# Patient Record
Sex: Female | Born: 1950 | Race: Black or African American | Hispanic: No | Marital: Single | Smoking: Never smoker
Health system: Southern US, Community
[De-identification: ages and names within clinical notes are randomized; demographics above are authoritative.]

## PROBLEM LIST (undated history)

## (undated) DIAGNOSIS — M1990 Unspecified osteoarthritis, unspecified site: Secondary | ICD-10-CM

## (undated) DIAGNOSIS — E859 Amyloidosis, unspecified: Secondary | ICD-10-CM

## (undated) DIAGNOSIS — I10 Essential (primary) hypertension: Secondary | ICD-10-CM

## (undated) DIAGNOSIS — E669 Obesity, unspecified: Secondary | ICD-10-CM

## (undated) DIAGNOSIS — E119 Type 2 diabetes mellitus without complications: Secondary | ICD-10-CM

## (undated) DIAGNOSIS — N189 Chronic kidney disease, unspecified: Secondary | ICD-10-CM

## (undated) HISTORY — DX: Essential (primary) hypertension: I10

## (undated) HISTORY — DX: Type 2 diabetes mellitus without complications: E11.9

## (undated) HISTORY — DX: Obesity, unspecified: E66.9

---

## 1999-09-10 ENCOUNTER — Encounter: Payer: Self-pay | Admitting: Family Medicine

## 1999-10-05 HISTORY — PX: TOTAL ABDOMINAL HYSTERECTOMY: SHX209

## 2000-07-04 HISTORY — PX: COMBINED ABDOMINOPLASTY AND LIPOSUCTION: SUR284

## 2000-07-04 LAB — CONVERTED CEMR LAB: Pap Smear: NORMAL

## 2004-11-14 ENCOUNTER — Ambulatory Visit: Payer: Self-pay | Admitting: Family Medicine

## 2004-11-28 ENCOUNTER — Ambulatory Visit: Payer: Self-pay | Admitting: Family Medicine

## 2004-12-04 ENCOUNTER — Ambulatory Visit: Payer: Self-pay | Admitting: Family Medicine

## 2005-01-02 ENCOUNTER — Ambulatory Visit: Payer: Self-pay | Admitting: Family Medicine

## 2005-01-02 LAB — HM MAMMOGRAPHY: HM Mammogram: NORMAL

## 2005-02-14 ENCOUNTER — Ambulatory Visit: Payer: Self-pay | Admitting: Family Medicine

## 2005-02-19 ENCOUNTER — Ambulatory Visit: Payer: Self-pay | Admitting: Family Medicine

## 2005-04-03 ENCOUNTER — Ambulatory Visit: Payer: Self-pay | Admitting: Family Medicine

## 2005-05-21 ENCOUNTER — Ambulatory Visit: Payer: Self-pay | Admitting: Family Medicine

## 2005-06-05 ENCOUNTER — Ambulatory Visit: Payer: Self-pay | Admitting: Family Medicine

## 2005-06-25 ENCOUNTER — Ambulatory Visit: Payer: Self-pay | Admitting: Family Medicine

## 2009-08-29 ENCOUNTER — Ambulatory Visit: Payer: Self-pay | Admitting: Family Medicine

## 2009-08-29 DIAGNOSIS — I10 Essential (primary) hypertension: Secondary | ICD-10-CM

## 2009-08-30 LAB — CONVERTED CEMR LAB
ALT: 11 units/L (ref 0–35)
AST: 16 units/L (ref 0–37)
Basophils Relative: 1.2 % (ref 0.0–3.0)
CO2: 32 meq/L (ref 19–32)
Calcium: 9.5 mg/dL (ref 8.4–10.5)
Chloride: 106 meq/L (ref 96–112)
Eosinophils Relative: 0.4 % (ref 0.0–5.0)
GFR calc non Af Amer: 72.54 mL/min (ref >60)
HCT: 40.4 % (ref 36.0–46.0)
Hemoglobin: 13.6 g/dL (ref 12.0–15.0)
Lymphs Abs: 2.2 10*3/uL (ref 0.7–4.0)
Monocytes Relative: 4.9 % (ref 3.0–12.0)
Neutro Abs: 3.4 10*3/uL (ref 1.4–7.7)
Platelets: 224 10*3/uL (ref 150.0–400.0)
Potassium: 4.6 meq/L (ref 3.5–5.1)
RBC: 4.65 M/uL (ref 3.87–5.11)
Sodium: 143 meq/L (ref 135–145)
Total Bilirubin: 0.6 mg/dL (ref 0.3–1.2)
Total CHOL/HDL Ratio: 4
Total Protein: 6.9 g/dL (ref 6.0–8.3)
WBC: 6 10*3/uL (ref 4.5–10.5)

## 2009-09-20 ENCOUNTER — Ambulatory Visit: Payer: Self-pay | Admitting: Family Medicine

## 2009-09-22 LAB — CONVERTED CEMR LAB
Calcium: 10 mg/dL (ref 8.4–10.5)
Creatinine, Ser: 0.8 mg/dL (ref 0.4–1.2)
Glucose, Bld: 147 mg/dL — ABNORMAL HIGH (ref 70–99)
Hgb A1c MFr Bld: 7.7 % — ABNORMAL HIGH (ref 4.6–6.5)
Sodium: 144 meq/L (ref 135–145)

## 2009-09-27 ENCOUNTER — Ambulatory Visit: Payer: Self-pay | Admitting: Family Medicine

## 2009-09-27 ENCOUNTER — Encounter: Payer: Self-pay | Admitting: Family Medicine

## 2009-09-27 LAB — HM MAMMOGRAPHY: HM Mammogram: NORMAL

## 2009-09-28 ENCOUNTER — Encounter (INDEPENDENT_AMBULATORY_CARE_PROVIDER_SITE_OTHER): Payer: Self-pay | Admitting: *Deleted

## 2009-11-16 ENCOUNTER — Telehealth: Payer: Self-pay | Admitting: Family Medicine

## 2009-11-22 ENCOUNTER — Encounter (INDEPENDENT_AMBULATORY_CARE_PROVIDER_SITE_OTHER): Payer: Self-pay | Admitting: *Deleted

## 2010-01-15 ENCOUNTER — Encounter (INDEPENDENT_AMBULATORY_CARE_PROVIDER_SITE_OTHER): Payer: Self-pay | Admitting: *Deleted

## 2010-01-16 ENCOUNTER — Ambulatory Visit: Payer: Self-pay | Admitting: Gastroenterology

## 2010-01-30 ENCOUNTER — Ambulatory Visit: Payer: Self-pay | Admitting: Gastroenterology

## 2010-02-05 ENCOUNTER — Encounter: Payer: Self-pay | Admitting: Gastroenterology

## 2010-02-26 ENCOUNTER — Ambulatory Visit: Payer: Self-pay | Admitting: Family Medicine

## 2010-02-26 DIAGNOSIS — E119 Type 2 diabetes mellitus without complications: Secondary | ICD-10-CM

## 2010-02-28 LAB — CONVERTED CEMR LAB
BUN: 12 mg/dL (ref 6–23)
CO2: 29 meq/L (ref 19–32)
Calcium: 9.7 mg/dL (ref 8.4–10.5)
Creatinine, Ser: 0.8 mg/dL (ref 0.4–1.2)
GFR calc non Af Amer: 88.56 mL/min (ref >60)

## 2010-06-05 NOTE — Procedures (Signed)
Summary: Colonoscopy  Patient: [name] Note: All result statuses are Final unless otherwise noted.  Tests: (1) Colonoscopy (COL)   COL Colonoscopy           DONE     [facility]     [address]     [place], [place]  [address]           COLONOSCOPY PROCEDURE REPORT           PATIENT:  Gina, Hartman  MR#:  [id number]     BIRTHDATE:  [birth year], 59 yrs. old  GENDER:  female           ENDOSCOPIST:  Barbette Hair. Arlyce Dice, MD     Referred by:  Marne A. Milinda Antis, M.D.           PROCEDURE DATE:  01/30/2010     PROCEDURE:  Colon with cold biopsy polypectomy     ASA CLASS:  Class II     INDICATIONS:  1) Routine Risk Screening           MEDICATIONS:   Fentanyl 75 mcg IV, Versed 7 mg IV           DESCRIPTION OF PROCEDURE:   After the risks benefits and     alternatives of the procedure were thoroughly explained, informed     consent was obtained.  Digital rectal exam was performed and     revealed no abnormalities.   The LB [id number]-[id number] endoscope     was introduced through the anus and advanced to the cecum, which     was identified by both the appendix and ileocecal valve, without     limitations.  The quality of the prep was excellent, using     MoviPrep.  The instrument was then slowly withdrawn as the colon     was fully examined.     <<PROCEDUREIMAGES>>           FINDINGS:  A diminutive polyp was found in the cecum. The polyp     was removed using cold biopsy forceps (see image2). 1-66mm sessile     polyp  A diverticulum was found in the distal transverse colon     (see image7). Single diverticulum  This was otherwise a normal     examination of the colon (see image1, image3, image4, image5,     image8, image9, and image10).   Retroflexed views in the rectum     revealed no abnormalities.    The time to cecum =  3.25  minutes.     The scope was then withdrawn (time =  7.25  min) from the patient     and the procedure completed.           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 Otherwise you should continue to follow colorectal cancer     screening guidelines for "routine risk" patients with colonoscopy     in 10 years.           REPEAT EXAM:   You will receive a letter from Dr.  Kaplan in 1-2     weeks, after reviewing the final pathology, with followup     recommendations.           ______________________________     Barbette Hair Arlyce Dice, MD           CC:           n.     eSIGNED:   Barbette Hair. Kaplan at 01/30/2010 08:36 AM           [name], [id number]  Note: An exclamation mark (!) indicates a result that was not dispersed into the flowsheet. Document Creation Date: 01/30/2010 8:37 AM _______________________________________________________________________  (1) Order result status: Final Collection or observation date-time: 01/30/2010 08:29 Requested date-time:  Receipt date-time:  Reported date-time:  Referring Physician:   Ordering Physician: Melvia Heaps [phone]) Specimen Source:  Source: Launa Grill Order Number: [id number] Lab site:   Appended Document: Colonoscopy     Procedures Next Due Date:    Colonoscopy: 01/2020

## 2010-06-05 NOTE — Progress Notes (Signed)
Summary: wants colonoscopy  Phone Note Call from Patient Call back at Home Phone [phone]   Caller: Patient Call For: Judith Part MD Summary of Call: Patient called to see if she could get referral for to have colonoscopy. Please adivse.  Initial call taken by: Melody Comas,  November 16, 2009 11:54 AM  Follow-up for Phone Call        order done Follow-up by: Judith Part MD,  November 16, 2009 12:43 PM

## 2010-06-05 NOTE — Letter (Signed)
Summary: Instructions/[facility] [place] [facility]   Imported By: Lanelle Bal 09/04/2009 09:08:18  _____________________________________________________________________  External Attachment:    Type:   Image     Comment:   External Document

## 2010-06-05 NOTE — Letter (Signed)
Summary: [facility] Instructions  [facility]  [address] [place], [place] [address]   Phone: [phone]  Fax: [phone]       [name]    March 03, 1951    MRN: [id number]        Procedure Day Dorna Bloom:  Jake Shark  01/30/10     Arrival Time:  7:30AM     Procedure Time:  8:00AM     Location of Procedure:                    [facility]  [facility] (4th Floor)                       PREPARATION FOR COLONOSCOPY WITH MOVIPREP   Starting 5 days prior to your procedure 01/25/10 do not eat nuts, seeds, popcorn, corn, beans, peas,  salads, or any raw vegetables.  Do not take any fiber supplements (e.g. Metamucil, Citrucel, and Benefiber).  THE DAY BEFORE YOUR PROCEDURE         DATE: 01/29/10  DAY: MONDAY  1.  Drink clear liquids the entire day-NO SOLID FOOD  2.  Do not drink anything colored red or purple.  Avoid juices with pulp.  No orange juice.  3.  Drink at least 64 oz. (8 glasses) of fluid/clear liquids during the day to prevent dehydration and help the prep work efficiently.  CLEAR LIQUIDS INCLUDE: Water Jello Ice Popsicles Tea (sugar ok, no milk/cream) Powdered fruit flavored drinks Coffee (sugar ok, no milk/cream) Gatorade Juice: apple, white grape, white cranberry  Lemonade Clear bullion, consomm, broth Carbonated beverages (any kind) Strained chicken noodle soup Hard Candy                             4.  In the morning, mix first dose of MoviPrep solution:    Empty 1 Pouch A and 1 Pouch B into the disposable container    Add lukewarm drinking water to the top line of the container. Mix to dissolve    Refrigerate (mixed solution should be used within 24 hrs)  5.  Begin drinking the prep at 5:00 p.m. The MoviPrep container is divided by 4 marks.   Every 15 minutes drink the solution down to the next mark (approximately 8 oz) until the full liter is complete.   6.  Follow completed prep with 16 oz of clear liquid of your choice (Nothing  red or purple).  Continue to drink clear liquids until bedtime.  7.  Before going to bed, mix second dose of MoviPrep solution:    Empty 1 Pouch A and 1 Pouch B into the disposable container    Add lukewarm drinking water to the top line of the container. Mix to dissolve    Refrigerate  THE DAY OF YOUR PROCEDURE      DATE: 01/30/10  DAY: TUESDAY  Beginning at 3:00AM (5 hours before procedure):         1. Every 15 minutes, drink the solution down to the next mark (approx 8 oz) until the full liter is complete.  2. Follow completed prep with 16 oz. of clear liquid of your choice.    3. You may drink clear liquids until 6:00AM (2 HOURS BEFORE PROCEDURE).   MEDICATION INSTRUCTIONS  Unless otherwise instructed, you should take regular prescription medications with a small sip of water   as early as possible the morning of  your procedure.   Additional medication instructions: Hold Lisinopril/HCTZ the morning of procedure.         OTHER INSTRUCTIONS  You will need a responsible adult at least 62 years of age to accompany you and drive you home.   This person must remain in the waiting room during your procedure.  Wear loose fitting clothing that is easily removed.  Leave jewelry and other valuables at home.  However, you may wish to bring a book to read or  an iPod/MP3 player to listen to music as you wait for your procedure to start.  Remove all body piercing jewelry and leave at home.  Total time from sign-in until discharge is approximately 2-3 hours.  You should go home directly after your procedure and rest.  You can resume normal activities the  day after your procedure.  The day of your procedure you should not:   Drive   Make legal decisions   Operate machinery   Drink alcohol   Return to work  You will receive specific instructions about eating, activities and medications before you leave.    The above instructions have been reviewed and explained to  me by   Wyona Almas RN  January 16, 2010 9:23 AM     I fully understand and can verbalize these instructions _____________________________ Date _________

## 2010-06-05 NOTE — Letter (Signed)
Summary: Results Follow up Letter  [facility]  [address] [place], [place] [address]   Phone: [phone]  Fax: [phone]    09/28/2009 MRN: [id number]     [facility] [address] [place], [place]  [address]    Dear [name],  The following are the results of your recent test(s):  Test         Result    Pap Smear:        Normal _____  Not Normal _____ Comments: ______________________________________________________ Cholesterol: LDL(Bad cholesterol):         Your goal is less than:         HDL (Good cholesterol):       Your goal is more than: Comments:  ______________________________________________________ Mammogram:        Normal __x___  Not Normal _____ Comments:Repeat in 1 year  ___________________________________________________________________ Hemoccult:        Normal _____  Not normal _______ Comments:    _____________________________________________________________________ Other Tests:    We routinely do not discuss normal results over the telephone.  If you desire a copy of the results, or you have any questions about this information we can discuss them at your next office visit.   Sincerely,  Roxy Manns MD

## 2010-06-05 NOTE — Letter (Signed)
Summary: Results Letter   G[facility]enter[facility]  [address] [place], [place] [address]   Phone: [phone]  Fax: [phone]        February 05, 2010 MRN: [id number]    [facility] [address] [place], [place]  [address]    Dear Ms. Fiola,  Your biopsy  results did not show any remarkable findings.  Please continue with the recommendations previously discussed.  Should you have any further questions or immediate concers, feel free to contact me.  Sincerely,  Barbette Hair. Arlyce Dice, M.D., [facility]          Sincerely,  Louis Meckel MD  This letter has been electronically signed by your physician.  Appended Document: Results Letter letter mailed

## 2010-06-05 NOTE — Assessment & Plan Note (Signed)
Summary: PT TO RE-ESTABLISH/CLE   Vital Signs:  Patient profile:   60 year old female Height:      62 inches Weight:      220 pounds BMI:     40.38 Temp:     98.6 degrees F oral Pulse rate:   72 / minute Pulse rhythm:   regular BP sitting:   184 / 112  (left arm) Cuff size:   large  Vitals Entered By: Lewanda Rife LPN (August 29, 2009 11:06 AM)  Serial Vital Signs/Assessments:  Time      Position  BP       Pulse  Resp  Temp     By                     168/100                        Judith Part MD  CC: [name] as pt   History of Present Illness: is here to re - est with me   she has HTN -- and took med for a while stopped coming for visits -- and got too busy bp med was making her dizzy -- hctz? or benazapril   not feeling bad at all no headaches or flushing  does get swollen fingers at times   3 times per week - [facility]  also uses elliptical 5 days per week  is staying in good shape eats chicken and fish - no fried foods/ steamed veg and brown rice   had gained wt and now lost it -- lost 2 sizes - proud of that  no hx of high chol   now not working - retired from her part time job  is going to work on health and wt for a while  does have insurance   is getting through menopause   knees hurt at times --- spinning hurt them  is interested in glucosamine to try for pain  knows further wt loss will help     Allergies (verified): No Known Drug Allergies  Past History:  Family History: Last updated: 08/29/2009 Mother: arthritis              high blood pressure              breast cancer              lung cancer             diabetes             mother deceased 2007/09/25  Social History: Last updated: 08/29/2009 G1P1 never smoked  no alcohol  regular exercise   Past Medical History: Hypertension  Past Surgical History: Hysterectomy 10/1999-- fibroids , total hyst  Abdominoplasty/Liposuction 07/2000  Family  History: Mother: arthritis              high blood pressure              breast cancer              lung cancer             diabetes             mother deceased 09-25-07  Social History: G1P1 never smoked  no alcohol  regular exercise   Review of Systems General:  Denies fatigue, fever, loss of appetite, and malaise. Eyes:  Denies blurring and eye pain.  CV:  Denies chest pain or discomfort, lightheadness, and palpitations. Resp:  Denies cough and wheezing. GI:  Denies abdominal pain, change in bowel habits, and indigestion. GU:  Denies abnormal vaginal bleeding, dysuria, and nocturia. MS:  Complains of joint pain and stiffness; denies joint redness, joint swelling, cramps, and muscle weakness. Derm:  Denies itching, lesion(s), poor wound healing, and rash. Neuro:  Denies numbness and tingling. Psych:  Denies anxiety and depression. Endo:  Denies cold intolerance, excessive thirst, excessive urination, and heat intolerance. Heme:  Denies abnormal bruising and bleeding.  Physical Exam  General:  overweight but generally well appearing  Head:  normocephalic, atraumatic, and no abnormalities observed.   Eyes:  vision grossly intact, pupils equal, pupils round, and pupils reactive to light.  no conjunctival pallor, injection or icterus  Ears:  R ear normal and L ear normal.  scant cerumen Mouth:  pharynx pink and moist.   Neck:  supple with full rom and no masses or thyromegally, no JVD or carotid bruit  Chest Wall:  No deformities, masses, or tenderness noted. Lungs:  Normal respiratory effort, chest expands symmetrically. Lungs are clear to auscultation, no crackles or wheezes. Heart:  Normal rate and regular rhythm. S1 and S2 normal without gallop, murmur, click, rub or other extra sounds. Abdomen:  Bowel sounds positive,abdomen soft and non-tender without masses, organomegaly or hernias noted. no renal bruits  Msk:  No deformity or scoliosis noted of thoracic or lumbar spine.  nl  rom knees today Pulses:  R and L carotid,radial,femoral,dorsalis pedis and posterior tibial pulses are full and equal bilaterally Extremities:  No clubbing, cyanosis, edema, or deformity noted with normal full range of motion of all joints.   Neurologic:  sensation intact to light touch, gait normal, and DTRs symmetrical and normal.   Skin:  Intact without suspicious lesions or rashes Cervical Nodes:  No lymphadenopathy noted Psych:  normal affect, talkative and pleasant    Impression & Recommendations:  Problem # 1:  HYPERTENSION (ICD-401.9) Assessment New out of control with need to get back on med  will start lisinopril hct - disc poss side eff disc reasons for tx HTN  lifestyle changes are great urged to keep it up lab today f/u 2-3 wk update earlier if side eff or problems  Her updated medication list for this problem includes:    Lisinopril-hydrochlorothiazide 20-12.5 Mg Tabs (Lisinopril-hydrochlorothiazide) .Marland Kitchen... 1 by mouth once daily in am  Orders: Venipuncture ([address]) TLB-Lipid Panel (80061-LIPID) TLB-Renal Function Panel (80069-RENAL) TLB-CBC Platelet - w/Differential (85025-CBCD) TLB-Hepatic/Liver Function Pnl (80076-HEPATIC) TLB-TSH (Thyroid Stimulating Hormone) (60454-UJW) Prescription Created Electronically [phone])  Problem # 2:  OTHER SCREENING MAMMOGRAM (ICD-V76.12) Assessment: Comment Only schedule annual mammogram - is overdue  Orders: Radiology Referral (Radiology)  Problem # 3:  SCREENING, COLON CANCER (ICD-V76.51) Assessment: Comment Only due for colonosc in may will put off sched this until HTN is in better control   Problem # 4:  Preventive Health Care (ICD-V70.0) Assessment: Comment Only Td update today  Complete Medication List: 1)  Flax Oil (Flaxseed (linseed)) .... Take two daily 2)  Fish Oil Oil (Fish oil) .... Take one daily by mouth 3)  Multivitamins Tabs (Multiple vitamin) .... Take 1 tablet by mouth once a day 4)   Lisinopril-hydrochlorothiazide 20-12.5 Mg Tabs (Lisinopril-hydrochlorothiazide) .Marland Kitchen.. 1 by mouth once daily in am  Other Orders: TD Toxoids IM 7 YR + ([address]) Admin 1st Vaccine ([address])  Patient Instructions: 1)  the current recommendation for calcium intake is 1200-1500 mg daily  with 1000 IU of vitamin D  2)  you can try some glucocamine and chondroiton for your knees  3)  keep up the good work with diet and exercise  4)  start lisinopril- hct and update me if any problems or side effects 5)  follow up with me in 2-3 weeks 6)  we will schedule mammogram at check out  7)  we will schedule screening colonoscopy at check out  8)  tetnus shot today  Prescriptions: LISINOPRIL-HYDROCHLOROTHIAZIDE 20-12.5 MG TABS (LISINOPRIL-HYDROCHLOROTHIAZIDE) 1 by mouth once daily in am  #30 x 11   Entered and Authorized by:   Judith Part MD   Signed by:   Judith Part MD on 08/29/2009   Method used:   Electronically to        [facility]* (retail)       [address]       [place], [place]  [address]       Ph: [phone]       Fax: [phone]   RxID:   [id number]   Current Allergies (reviewed today): No known allergies    Preventive Care Screening  Colonoscopy:    Date:  09/10/1999    Next Due:  09/2009    Results:  normal   Mammogram:    Date:  01/02/2005    Results:  normal   Pap Smear:    Date:  07/04/2000    Results:  normal    Immunizations Administered:  Tetanus Vaccine:    Vaccine Type: Td    Site: left deltoid    Mfr: Sanofi Pasteur    Dose: 0.5 ml    Route: IM    Given by: Lewanda Rife LPN    Exp. Date: 03/21/2011    Lot #: [id number]    VIS given: 03/24/07 version given August 29, 2009.

## 2010-06-05 NOTE — Assessment & Plan Note (Signed)
Summary: 2-3 WEEK FOLLOW UP/[facility]   Vital Signs:  Patient profile:   60 year old female Height:      62 inches Weight:      217.25 pounds BMI:     39.88 Temp:     98.3 degrees F oral Pulse rate:   80 / minute Pulse rhythm:   regular BP sitting:   130 / 86  (left arm) Cuff size:   large  Vitals Entered By: Lewanda Rife LPN (Sep 20, 2009 11:27 AM) CC: 2-3 week f/u   History of Present Illness: here for f/u of HTN and hyperglycemia   labs overall not bad lipids with trig 110 and HDL 47 and LDL 106  fasting sugar a bit  high at 145 no excessive thirst or urination   diet -- has changed a lot  much less junk food -- is really aiming for low sugar  is eating higher fiber foods  is exercising more -- elliptical and water aerobics - 5 days per week    bp better back on med 130/86 first check her med is making her a bit lightheaded -- one day systolic was 109 (had not eaten enough that day)  wants to try a bit longer before stopping   definitely feels better - knees do not hurt as bad and is really stronger and with more stamina   colonosc is due in may-- she does not want to schedule that yet-- will call and let us know when ready to do that  Allergies (verified): No Known Drug Allergies  Past History:  Past Medical History: Last updated: 08/29/2009 Hypertension  Past Surgical History: Last updated: 08/29/2009 Hysterectomy 10/1999-- fibroids , total hyst  Abdominoplasty/Liposuction 07/2000  Family History: Last updated: 08/29/2009 Mother: arthritis              high blood pressure              breast cancer              lung cancer             diabetes             mother deceased 10/14/2007  Social History: Last updated: 08/29/2009 G1P1 never smoked  no alcohol  regular exercise   Review of Systems General:  Denies fatigue, loss of appetite, and malaise. Eyes:  Denies blurring and eye irritation. CV:  Denies chest pain or discomfort, lightheadness,  palpitations, and swelling of feet. Resp:  Denies cough, shortness of breath, and wheezing. GI:  Denies abdominal pain, indigestion, and nausea. GU:  Denies urinary frequency. MS:  Denies muscle aches and cramps. Derm:  Denies itching, lesion(s), poor wound healing, and rash. Neuro:  Denies headaches, numbness, sensation of room spinning, tingling, visual disturbances, and weakness. Psych:  mood is good . Endo:  Denies cold intolerance, excessive thirst, excessive urination, and heat intolerance. Heme:  Denies abnormal bruising and bleeding.  Physical Exam  General:  overweight but generally well appearing  Head:  normocephalic, atraumatic, and no abnormalities observed.   Eyes:  vision grossly intact, pupils equal, pupils round, and pupils reactive to light.  no conjunctival pallor, injection or icterus  Mouth:  pharynx pink and moist.   Neck:  supple with full rom and no masses or thyromegally, no JVD or carotid bruit  Lungs:  Normal respiratory effort, chest expands symmetrically. Lungs are clear to auscultation, no crackles or wheezes. Heart:  Normal rate and regular rhythm. S1 and  S2 normal without gallop, murmur, click, rub or other extra sounds. Msk:  No deformity or scoliosis noted of thoracic or lumbar spine.  nl rom knees today Pulses:  R and L carotid,radial,femoral,dorsalis pedis and posterior tibial pulses are full and equal bilaterally Extremities:  No clubbing, cyanosis, edema, or deformity noted with normal full range of motion of all joints.   Neurologic:  sensation intact to light touch, gait normal, and DTRs symmetrical and normal.   Skin:  Intact without suspicious lesions or rashes Cervical Nodes:  No lymphadenopathy noted Psych:  normal affect, talkative and pleasant    Impression & Recommendations:  Problem # 1:  HYPERTENSION (ICD-401.9) Assessment Improved  this is improved with re start of lisinopril hct (? though some occ light headedness) pt wants to try  a bit longer and monitor pressure  also great diet and exercis and wt loss- commended on these changes lab today  f/u 6 mo  prev labs rev in detail incl fairly good chol prof Her updated medication list for this problem includes:    Lisinopril-hydrochlorothiazide 20-12.5 Mg Tabs (Lisinopril-hydrochlorothiazide) .Marland Kitchen... 1 by mouth once daily in am  Orders: Venipuncture ([address]) TLB-Renal Function Panel (80069-RENAL) TLB-A1C / Hgb A1C (Glycohemoglobin) (83036-A1C)  BP today: 130/86 Prior BP: 184/112 (08/29/2009)  Labs Reviewed: K+: 4.6 (08/29/2009) Creat: : 1.0 (08/29/2009)   Chol: 175 (08/29/2009)   HDL: 47.00 (08/29/2009)   LDL: 106 (08/29/2009)   TG: 110.0 (08/29/2009)  Problem # 2:  HYPERGLYCEMIA (ICD-790.29) Assessment: New fasting sugar of 145 reviewed does have fam hx of DM and is obese disc diet and exercise in detail- doing much better given handout from [facility] on DM diet  check AIC today  f/u 6 mo or earlier if it is high  urged to keep working on wt loss  Orders: Venipuncture (81[address]) TLB-Renal Function Panel (80069-RENAL) TLB-A1C / Hgb A1C (Glycohemoglobin) (83036-A1C)  Complete Medication List: 1)  Flax Oil (Flaxseed (linseed)) .... Take two daily 2)  Fish Oil Oil (Fish oil) .... Take one daily by mouth 3)  Multivitamins Tabs (Multiple vitamin) .... Take 1 tablet by mouth once a day 4)  Lisinopril-hydrochlorothiazide 20-12.5 Mg Tabs (Lisinopril-hydrochlorothiazide) .Marland Kitchen.. 1 by mouth once daily in am  Patient Instructions: 1)  labs today 2)  checking AIC for possible diabetes  3)  blood pressure is better  4)  continue current medicines  5)  if your lightheadedness continues -- please call  6)  follow up with me in 6 months 7)  call us when you are ready to schedule your colonoscopy 8)  keep up the great exercise   Current Allergies (reviewed today): No known allergies

## 2010-06-05 NOTE — Letter (Signed)
Summary: Previsit letter  [facility]  [address] [place], [place] [address]   Phone: [phone]  Fax: [phone]       11/22/2009 MRN: [id number]  [facility] [address] [place], [place]  [address]  Dear Ms. Bialecki,  Welcome to the Gastroenterology Division at [facility]    You are scheduled to see a nurse for your pre-procedure visit on 01-16-10 at 9:00a.m. on the 3rd floor at [facility], [address]  We ask that you try to arrive at our office 15 minutes prior to your appointment time to allow for check-in.  Your nurse visit will consist of discussing your medical and surgical history, your immediate family medical history, and your medications.    Please bring a complete list of all your medications or, if you prefer, bring the medication bottles and we will list them.  We will need to be aware of both prescribed and over the counter drugs.  We will need to know exact dosage information as well.  If you are on blood thinners (Coumadin, Plavix, Aggrenox, Ticlid, etc.) please call our office today/prior to your appointment, as we need to consult with your physician about holding your medication.   Please be prepared to read and sign documents such as consent forms, a financial agreement, and acknowledgement forms.  If necessary, and with your consent, a friend or relative is welcome to sit-in on the nurse visit with you.  Please bring your insurance card so that we may make a copy of it.  If your insurance requires a referral to see a specialist, please bring your referral form from your primary care physician.  No co-pay is required for this nurse visit.     If you cannot keep your appointment, please call [phone] to cancel or reschedule prior to your appointment date.  This allows [place] the opportunity to schedule an appointment for another patient in need of care.    Thank you for choosing [facility] for your medical  needs.  We appreciate the opportunity to care for you.  Please visit [place] at our website  to learn more about our practice.                     Sincerely.                                                                                                                   The Gastroenterology Division

## 2010-06-05 NOTE — Assessment & Plan Note (Signed)
Summary: 6 MONTH FOLLOW UP, BP ELEVATED   Vital Signs:  Patient profile:   60 year old female Height:      62 inches Weight:      216.50 pounds BMI:     39.74 Temp:     98.8 degrees F oral Pulse rate:   80 / minute Pulse rhythm:   regular BP sitting:   158 / 86  (left arm) Cuff size:   large  Vitals Entered By: Lewanda Rife LPN (February 26, 2010 12:22 PM) CC: ?BP elevated dizzy on and off since 02/16/10   History of Present Illness: here for f/u of HTN and also DM  is on lisinopril/ hct   bp has been high since oct 14th or so  running in 150s /80s  woke up then and room was spinning -- originally 200/100  ? vertigo  the next day no problems now once in a while spinning - brief - on and off  no med doses  can exercise with no problems   last AIC7.7 recommended DM ed   recommended diet and sugar check she does not  she knows what a DM diet is and keeping up with that (mother had DM)  changed all white starch to brown  more fiber/less sugar no sweets in general -- except occ animal cookies  no soda or juice   exercising a lot -- doing a 5K sat -- walk and run  Allergies (verified): No Known Drug Allergies  Past History:  Past Medical History: Last updated: 08/29/2009 Hypertension  Past Surgical History: Last updated: 08/29/2009 Hysterectomy 10/1999-- fibroids , total hyst  Abdominoplasty/Liposuction 07/2000  Family History: Last updated: 08/29/2009 Mother: arthritis              high blood pressure              breast cancer              lung cancer             diabetes             mother deceased Oct 02, 2007  Social History: Last updated: 08/29/2009 G1P1 never smoked  no alcohol  regular exercise   Review of Systems General:  Denies chills, fatigue, fever, loss of appetite, malaise, and sleep disorder. Eyes:  Denies blurring, double vision, eye irritation, and eye pain. ENT:  Complains of postnasal drainage; denies nasal congestion, ringing in ears,  sinus pressure, and sore throat. CV:  Denies chest pain or discomfort, palpitations, shortness of breath with exertion, and swelling of feet. Resp:  Denies cough, shortness of breath, and wheezing. GI:  Denies abdominal pain, nausea, and vomiting. GU:  Denies dysuria and urinary frequency. MS:  Denies muscle aches and cramps. Derm:  Denies itching, lesion(s), poor wound healing, and rash. Neuro:  Denies numbness and tingling. Psych:  Denies anxiety and depression. Endo:  Denies cold intolerance, excessive thirst, excessive urination, and heat intolerance. Heme:  Denies abnormal bruising and bleeding. Allergy:  ? seasonal allergies.  Physical Exam  General:  overweight but generally well appearing  Head:  normocephalic, atraumatic, and no abnormalities observed.   Eyes:  vision grossly intact, pupils equal, pupils round, and pupils reactive to light.  no conjunctival pallor, injection or icterus  1-2 beats of horiz nystagmus bilat  Ears:  R ear normal and L ear normal.   Nose:  nares are boggy but clear Mouth:  pharynx pink and moist.   Neck:  supple  with full rom and no masses or thyromegally, no JVD or carotid bruit  Chest Wall:  No deformities, masses, or tenderness noted. Lungs:  Normal respiratory effort, chest expands symmetrically. Lungs are clear to auscultation, no crackles or wheezes. Heart:  Normal rate and regular rhythm. S1 and S2 normal without gallop, murmur, click, rub or other extra sounds. Abdomen:  soft and non-tender.  no renal bruits  Msk:  No deformity or scoliosis noted of thoracic or lumbar spine.   Pulses:  R and L carotid,radial,femoral,dorsalis pedis and posterior tibial pulses are full and equal bilaterally Extremities:  No clubbing, cyanosis, edema, or deformity noted with normal full range of motion of all joints.   Neurologic:  alert & oriented X3, cranial nerves II-XII intact, strength normal in all extremities, sensation intact to light touch, gait  normal, DTRs symmetrical and normal, and Romberg negative.   Skin:  Intact without suspicious lesions or rashes Cervical Nodes:  No lymphadenopathy noted Psych:  normal affect, talkative and pleasant   Diabetes Management Exam:    Foot Exam (with socks and/or shoes not present):       Sensory-Pinprick/Light touch:          Left medial foot (L-4): normal          Left dorsal foot (L-5): normal          Left lateral foot (S-1): normal          Right medial foot (L-4): normal          Right dorsal foot (L-5): normal          Right lateral foot (S-1): normal       Sensory-Monofilament:          Left foot: normal          Right foot: normal       Inspection:          Left foot: normal          Right foot: normal       Nails:          Left foot: normal          Right foot: normal   Impression & Recommendations:  Problem # 1:  DIABETES-TYPE 2 (ICD-250.00) Assessment New with last AIc 7.7 pt has been to DM ed before and could not afford to go again says she has good understanding of diet lab today disc at f/u disc healthy diet (low simple sugar/ choose complex carbs/ low sat fat) diet and exercise in detail    Her updated medication list for this problem includes:    Lisinopril-hydrochlorothiazide 20-12.5 Mg Tabs (Lisinopril-hydrochlorothiazide) .Marland Kitchen... 2  by mouth once daily in am  Problem # 2:  HYPERTENSION (ICD-401.9) Assessment: Deteriorated  this is worse - despite better diet and exercise  inc lisin/ hct to 40-25 today-- check at home and update lab today f/u after nov trip   Her updated medication list for this problem includes:    Lisinopril-hydrochlorothiazide 20-12.5 Mg Tabs (Lisinopril-hydrochlorothiazide) .Marland Kitchen... 2  by mouth once daily in am  Orders: Venipuncture (13244) TLB-Renal Function Panel (80069-RENAL) TLB-ALT (SGPT) (84460-ALT) TLB-AST (SGOT) (84450-SGOT) TLB-A1C / Hgb A1C (Glycohemoglobin) (83036-A1C) Prescription Created Electronically [phone])  BP  today: 158/86 Prior BP: 130/86 (09/20/2009)  Labs Reviewed: K+: 4.5 (09/20/2009) Creat: : 0.8 (09/20/2009)   Chol: 175 (08/29/2009)   HDL: 47.00 (08/29/2009)   LDL: 106 (08/29/2009)   TG: 110.0 (08/29/2009)  Problem # 3:  VERTIGO (ICD-780.4) Assessment: New  one bad day - now much better  will watch closely in light of inc bp  update asap if symptoms worsen or ha or other neurol change  Complete Medication List: 1)  Flax Oil (Flaxseed (linseed)) .... Take two daily 2)  Fish Oil Oil (Fish oil) .... Take one daily by mouth 3)  Multivitamins Tabs (Multiple vitamin) .... Take 1 tablet by mouth once a day 4)  Lisinopril-hydrochlorothiazide 20-12.5 Mg Tabs (Lisinopril-hydrochlorothiazide) .... 2  by mouth once daily in am 5)  Glucometer  .... To check glucose one time daily  and as needed for dm 250.0 6)  Glucose Test Strips and Lancets  .... Check sugar once daily and as needed for dm 250.0  Patient Instructions: 1)  increase your lisinopril hct to 2 pills once daily  2)  let me know if any side effects  3)  check sugar am fasting MWF and 2 hours after and afternoon meal on T, TH, Sat, Sun  4)  eat a diabetic diet  5)  get book sugar busters from bookstore  6)  keep up the exercise 7)  if dizziness does not improve in next 1-2 weeks- let me know 8)  follow up with me in 2-3 weeks  Prescriptions: GLUCOMETER to check glucose one time daily  and as needed for DM 250.0  #1 x 0   Entered and Authorized by:   Judith Part MD   Signed by:   Judith Part MD on 02/26/2010   Method used:   Print then Give to Patient   RxID:   [id number] GLUCOSE TEST STRIPS AND LANCETS check sugar once daily and as needed for DM 250.0  #100 each x 3   Entered and Authorized by:   Judith Part MD   Signed by:   Judith Part MD on 02/26/2010   Method used:   Print then Give to Patient   RxID:   [id number] LISINOPRIL-HYDROCHLOROTHIAZIDE 20-12.5 MG TABS (LISINOPRIL-HYDROCHLOROTHIAZIDE) 2   by mouth once daily in am  #60 x 3   Entered and Authorized by:   Judith Part MD   Signed by:   Judith Part MD on 02/26/2010   Method used:   Electronically to        [facility]* (retail)       [address]       [place], [place]  [address]       Ph: [phone]       Fax: [phone]   RxID:   [id number]    Orders Added: 1)  Venipuncture [56433] 2)  TLB-Renal Function Panel [80069-RENAL] 3)  TLB-ALT (SGPT) [84460-ALT] 4)  TLB-AST (SGOT) [84450-SGOT] 5)  TLB-A1C / Hgb A1C (Glycohemoglobin) [83036-A1C] 6)  Prescription Created Electronically [G8553] 7)  Est. Patient Level IV [29518]    Current Allergies (reviewed today): No known allergies

## 2010-06-05 NOTE — Miscellaneous (Signed)
Summary: [facility] Previsit/prep  Clinical Lists Changes  Medications: Added new medication of MOVIPREP 100 GM  SOLR (PEG-KCL-NACL-NASULF-NA ASC-C) As per prep instructions. - Signed Rx of MOVIPREP 100 GM  SOLR (PEG-KCL-NACL-NASULF-NA ASC-C) As per prep instructions.;  #1 x 0;  Signed;  Entered by: Wyona Almas RN;  Authorized by: Louis Meckel MD;  Method used: Electronically to [facility]*, [address], [place], [place]  [address], Ph: [phone], Fax: [phone] Observations: Added new observation of NKA: T (01/16/2010 8:24)    Prescriptions: MOVIPREP 100 GM  SOLR (PEG-KCL-NACL-NASULF-NA ASC-C) As per prep instructions.  #1 x 0   Entered by:   Wyona Almas RN   Authorized by:   Louis Meckel MD   Signed by:   Wyona Almas RN on 01/16/2010   Method used:   Electronically to        [facility]* (retail)       [address]       [place], [place]  [address]       Ph: [phone]       Fax: [phone]   RxID:   [id number]

## 2010-06-13 ENCOUNTER — Ambulatory Visit (INDEPENDENT_AMBULATORY_CARE_PROVIDER_SITE_OTHER): Payer: 59 | Admitting: Family Medicine

## 2010-06-13 ENCOUNTER — Encounter: Payer: Self-pay | Admitting: Family Medicine

## 2010-06-13 DIAGNOSIS — E119 Type 2 diabetes mellitus without complications: Secondary | ICD-10-CM

## 2010-06-13 DIAGNOSIS — I10 Essential (primary) hypertension: Secondary | ICD-10-CM

## 2010-06-27 NOTE — Assessment & Plan Note (Signed)
Summary: FOLLOW-UP/ LFW   Vital Signs:  Patient profile:   60 year old female Height:      62 inches Weight:      224.25 pounds BMI:     41.16 Temp:     98 degrees F oral Pulse rate:   76 / minute Pulse rhythm:   regular BP sitting:   118 / 76  (left arm) Cuff size:   large  Vitals Entered By: Lewanda Rife LPN (June 13, 2010 8:37 AM) CC: follow-up visit and refill meds   History of Present Illness: here for f/u of HTN and DM  wt is up 8 lb with bmi of 41 gained through the holidays and also went on a trip   118/76 is bp - better after inc of lisinopril hct no problems or side effects  is trying to drink a lot of water   joined [employer] -- has to take accountability for everything  doing since monday- lost 1 lb  for exercise - using eliptical / wts and water aerobics a personal trainer also     AIC is up to 7.8 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 2 years  used to see Dr Senaida Ores    Allergies (verified): No Known Drug Allergies  Past History:  Past Surgical History: Last updated: 08/29/2009 Hysterectomy 10/1999-- fibroids , total hyst  Abdominoplasty/Liposuction 07/2000  Family History: Last updated: 08/29/2009 Mother: arthritis              high blood pressure              breast cancer              lung cancer             diabetes             mother deceased Sep 27, 2007  Social History: Last updated: 08/29/2009 G1P1 never smoked  no alcohol  regular exercise   Past Medical History: Hypertension DM 2  obesity  Review of Systems General:  Denies fatigue, loss of appetite, and malaise. Eyes:  Denies blurring, eye irritation, and eye pain. CV:  Denies chest pain or discomfort, lightheadness,  and palpitations. Resp:  Denies cough and wheezing. GI:  Denies abdominal pain, indigestion, and nausea. GU:  Denies urinary frequency. MS:  Denies muscle aches and cramps. Derm:  Denies itching, lesion(s), poor wound healing, and rash. Neuro:  Denies headaches, numbness, and tingling. Endo:  Denies cold intolerance, excessive thirst, excessive urination, and heat intolerance. Heme:  Denies abnormal bruising and bleeding.  Physical Exam  General:  overweight but generally well appearing  Head:  normocephalic, atraumatic, and no abnormalities observed.   Eyes:  vision grossly intact, pupils equal, pupils round, and pupils reactive to light.   Mouth:  pharynx pink and moist.   Neck:  supple with full rom and no masses or thyromegally, no JVD or carotid bruit  Chest Wall:  No deformities, masses, or tenderness noted. Lungs:  Normal respiratory effort, chest expands symmetrically. Lungs are clear to auscultation, no crackles or wheezes. Heart:  Normal rate and regular rhythm. S1 and S2 normal without gallop, murmur, click,  rub or other extra sounds. Msk:  No deformity or scoliosis noted of thoracic or lumbar spine.   Pulses:  R and L carotid,radial,femoral,dorsalis pedis and posterior tibial pulses are full and equal bilaterally Extremities:  No clubbing, cyanosis, edema, or deformity noted with normal full range of motion of all joints.   Neurologic:  sensation intact to light touch, gait normal, and DTRs symmetrical and normal.   Skin:  Intact without suspicious lesions or rashes Cervical Nodes:  No lymphadenopathy noted Psych:  cheerful and motivated today  Diabetes Management Exam:    Foot Exam (with socks and/or shoes not present):       Sensory-Pinprick/Light touch:          Left medial foot (L-4): normal          Left dorsal foot (L-5): normal          Left lateral foot (S-1): normal          Right medial foot (L-4): normal          Right dorsal foot (L-5): normal           Right lateral foot (S-1): normal       Sensory-Monofilament:          Left foot: normal          Right foot: normal       Inspection:          Left foot: normal          Right foot: normal       Nails:          Left foot: normal          Right foot: normal   Impression & Recommendations:  Problem # 1:  DIABETES-TYPE 2 (ICD-250.00) Assessment Deteriorated  this is worse from holiday and vacation eating and with wt gain however pt is very motivated by her new [employer] program and sugars are already coming down  will start low dose metformin two times a day - update if problems or side eff lab and f/u 3 mo  opthy exam sched Her updated medication list for this problem includes:    Lisinopril-hydrochlorothiazide 20-12.5 Mg Tabs (Lisinopril-hydrochlorothiazide) .Marland Kitchen... 2  by mouth once daily in am    Metformin Hcl 500 Mg Tabs (Metformin hcl) .Marland Kitchen... 1/2 by mouth two times a day  Orders: Ophthalmology Referral (Ophthalmology)  Labs Reviewed: Creat: 0.8 (02/26/2010)    Reviewed HgBA1c results: 7.8 (02/26/2010)  7.7 (09/20/2009)  Problem # 2:  HYPERTENSION (ICD-401.9) Assessment: Improved  much improved with inc of lisinopril hct  continue the new good diet and exercise lab and f/u in 3 mo  Her updated medication list for this problem includes:    Lisinopril-hydrochlorothiazide 20-12.5 Mg Tabs (Lisinopril-hydrochlorothiazide) .Marland Kitchen... 2  by mouth once daily in am  BP today: 118/76 Prior BP: 158/86 (02/26/2010)  Labs Reviewed: K+: 4.4 (02/26/2010) Creat: : 0.8 (02/26/2010)   Chol: 175 (08/29/2009)   HDL: 47.00 (08/29/2009)   LDL: 106 (08/29/2009)   TG: 110.0 (08/29/2009)  Orders: Prescription Created Electronically [phone])  Complete Medication List: 1)  Flax Oil (Flaxseed (linseed)) .... Take two daily 2)  Fish Oil Oil (Fish oil) .... Take one daily by mouth 3)  Multivitamins Tabs (Multiple vitamin) .... Take 1 tablet by mouth once a day 4)   Lisinopril-hydrochlorothiazide 20-12.5 Mg Tabs (Lisinopril-hydrochlorothiazide) .... 2  by mouth once daily in am 5)  Glucometer  .... To check glucose one time daily  and as needed for dm 250.0 6)  Glucose Test Strips and Lancets  .... Check sugar once daily and as needed for dm 250.0 7)  Metformin Hcl 500 Mg Tabs (Metformin hcl) .... 1/2 by mouth two times a day  Patient Instructions: 1)  we will do referral for eye doctor appt  2)  keep up the good work with your new health program  3)  start metformin 250 mg two times a day -- alert me if any side effects or problems 4)  no other changes 5)  follow up in 3 months , with labs prior lipid/ast/alt/ AIC / renal 250.0 and 272  Prescriptions: LISINOPRIL-HYDROCHLOROTHIAZIDE 20-12.5 MG TABS (LISINOPRIL-HYDROCHLOROTHIAZIDE) 2  by mouth once daily in am  #60 x 11   Entered and Authorized by:   Judith Part MD   Signed by:   Judith Part MD on 06/13/2010   Method used:   Electronically to        [facility]* (retail)       [address]       [place], [place]  [address]       Ph: [phone]       Fax: [phone]   RxID:   [id number] METFORMIN HCL 500 MG TABS (METFORMIN HCL) 1/2 by mouth two times a day  #30 x 11   Entered and Authorized by:   Judith Part MD   Signed by:   Judith Part MD on 06/13/2010   Method used:   Electronically to        [facility]* (retail)       [address]       [place], [place]  [address]       Ph: [phone]       Fax: [phone]   RxID:   [id number]    Orders Added: 1)  Ophthalmology Referral [Ophthalmology] 2)  Est. Patient Level III [37628] 3)  Prescription Created Electronically [phone]    Current Allergies (reviewed today): No known allergies

## 2010-07-14 ENCOUNTER — Encounter: Payer: Self-pay | Admitting: Family Medicine

## 2010-07-14 DIAGNOSIS — E119 Type 2 diabetes mellitus without complications: Secondary | ICD-10-CM | POA: Insufficient documentation

## 2010-07-14 DIAGNOSIS — I10 Essential (primary) hypertension: Secondary | ICD-10-CM | POA: Insufficient documentation

## 2010-07-14 DIAGNOSIS — E669 Obesity, unspecified: Secondary | ICD-10-CM

## 2010-09-03 ENCOUNTER — Other Ambulatory Visit: Payer: Self-pay | Admitting: Family Medicine

## 2010-09-03 DIAGNOSIS — E7800 Pure hypercholesterolemia, unspecified: Secondary | ICD-10-CM

## 2010-09-03 DIAGNOSIS — E119 Type 2 diabetes mellitus without complications: Secondary | ICD-10-CM

## 2010-09-06 ENCOUNTER — Other Ambulatory Visit (INDEPENDENT_AMBULATORY_CARE_PROVIDER_SITE_OTHER): Payer: 59

## 2010-09-06 DIAGNOSIS — E7800 Pure hypercholesterolemia, unspecified: Secondary | ICD-10-CM

## 2010-09-06 DIAGNOSIS — E119 Type 2 diabetes mellitus without complications: Secondary | ICD-10-CM

## 2010-09-06 LAB — RENAL FUNCTION PANEL
CO2: 28 mEq/L (ref 19–32)
Chloride: 103 mEq/L (ref 96–112)
GFR: 74 mL/min (ref >60.00)
Potassium: 4.1 mEq/L (ref 3.5–5.1)
Sodium: 139 mEq/L (ref 135–145)

## 2010-09-06 LAB — LIPID PANEL
Cholesterol: 144 mg/dL (ref 0–200)
Triglycerides: 86 mg/dL (ref 0.0–149.0)

## 2010-09-06 LAB — AST: AST: 20 U/L (ref 0–37)

## 2010-09-11 ENCOUNTER — Encounter: Payer: Self-pay | Admitting: Family Medicine

## 2010-09-11 ENCOUNTER — Ambulatory Visit (INDEPENDENT_AMBULATORY_CARE_PROVIDER_SITE_OTHER): Payer: 59 | Admitting: Family Medicine

## 2010-09-11 DIAGNOSIS — I10 Essential (primary) hypertension: Secondary | ICD-10-CM

## 2010-09-11 DIAGNOSIS — E119 Type 2 diabetes mellitus without complications: Secondary | ICD-10-CM

## 2010-09-11 DIAGNOSIS — M25569 Pain in unspecified knee: Secondary | ICD-10-CM

## 2010-09-11 DIAGNOSIS — E669 Obesity, unspecified: Secondary | ICD-10-CM

## 2010-09-11 DIAGNOSIS — M25562 Pain in left knee: Secondary | ICD-10-CM | POA: Insufficient documentation

## 2010-09-11 DIAGNOSIS — M25561 Pain in right knee: Secondary | ICD-10-CM

## 2010-09-11 NOTE — Progress Notes (Signed)
  Subjective:    Patient ID: [name], female    DOB: 01/22/1951, 60 y.o.   MRN: [id number]  HPI Here for f/u of DM and HTN and obesity and a lot of knee problems -- both knees - getting worse  Thinks it is exercise induced     Has lost 14lb since last visit  Is proud of that - same by her scale -- 16 lb  Still doing [employer] and exercising - walks 3-4 times per week     Despite this her a1c is stable at 7.8 opthy Diet --great Exercise--great  On 1/2 of 500 metformin bid  Sugars - in am -- 129-139 ,   And 2 hours after a meal is "fine" - tends to be on the low side   HTN in very good control 118/82 On prinizide - no problems No edema or ha or cp  Past Medical History  Diagnosis Date  . HTN (hypertension)   . Diabetes mellitus type II   . Obesity     History   Social History  . Marital Status: Single    Spouse Name: N/A    Number of Children: 1  . Years of Education: N/A   Occupational History  . Not on file.   Social History Main Topics  . Smoking status: Never Smoker   . Smokeless tobacco: Not on file  . Alcohol Use: No  . Drug Use: Not on file  . Sexually Active: Not on file   Other Topics Concern  . Not on file   Social History Narrative  . No narrative on file         Review of Systems Review of Systems  Constitutional: Negative for fever, appetite change, fatigue and unexpected weight change.  Eyes: Negative for pain and visual disturbance.  Respiratory: Negative for cough and shortness of breath.   Cardiovascular: Negative.   Gastrointestinal: Negative for nausea, diarrhea and constipation.  Genitourinary: Negative for urgency and frequency.  Skin: Negative for pallor.  MSK -pos for knee pain without swelling  Neurological: Negative for weakness, light-headedness, numbness and headaches.  Hematological: Negative for adenopathy. Does not bruise/bleed easily.  Psychiatric/Behavioral: Negative for dysphoric mood. The  patient is not nervous/anxious.          Objective:   Physical Exam  Constitutional: She appears well-developed and well-nourished. No distress.       overwt and well appearing   HENT:  Head: Normocephalic and atraumatic.  Eyes: Conjunctivae and EOM are normal. Pupils are equal, round, and reactive to light.  Neck: Normal range of motion. Neck supple. No JVD present. Carotid bruit is not present. No thyromegaly present.  Cardiovascular: Normal rate, regular rhythm and normal heart sounds.   Pulmonary/Chest: Effort normal and breath sounds normal. No respiratory distress. She has no wheezes.  Abdominal: Soft. Bowel sounds are normal. She exhibits no abdominal bruit and no mass. There is no tenderness.  Musculoskeletal: She exhibits no edema and no tenderness.       Some knee crepitice bilat - no eff   Lymphadenopathy:    She has no cervical adenopathy.  Neurological: She is alert. She has normal reflexes. Coordination normal.  Skin: Skin is warm and dry. No rash noted. No erythema. No pallor.  Psychiatric: She has a normal mood and affect.          Assessment & Plan:

## 2010-09-11 NOTE — Assessment & Plan Note (Signed)
This is very well controlled with ace/ hct No changes Labs rev F/u 3 mo for PE

## 2010-09-11 NOTE — Assessment & Plan Note (Signed)
Worsening with exercise- recommend lower impact program Pt is at risk of patellofemoral syndrome and oa given wt  Will consider sport med consult and let me know

## 2010-09-11 NOTE — Assessment & Plan Note (Signed)
Commended on great work so far with [employer] program - wt loss by diet and exercise  Urged to keep up the good work

## 2010-09-11 NOTE — Assessment & Plan Note (Signed)
Doing great with diet and exercise and wt loss Home sugars sound good too- but that does not correlate with high a1c  This is puzzling inst pt to check sugars at random times incl night  Mail or drop these off in 2 weeks - ? Will inc metformin  F/u 3 mo for PE

## 2010-09-11 NOTE — Patient Instructions (Signed)
Weight is improved, cholesterol good and so is blood pressure I am puzzled by unchanged a1c for sugar control  Please check sugar at some odd times -- midday, bedtime , just after eating  Send me some reports with some times in 2 weeks  Schedule physical in about 3 months with labs prior

## 2010-09-13 ENCOUNTER — Ambulatory Visit (INDEPENDENT_AMBULATORY_CARE_PROVIDER_SITE_OTHER): Payer: 59 | Admitting: Family Medicine

## 2010-09-13 ENCOUNTER — Encounter: Payer: Self-pay | Admitting: Family Medicine

## 2010-09-13 VITALS — BP 120/74 | HR 64 | Temp 97.9°F | Ht 62.5 in | Wt 209.1 lb

## 2010-09-13 DIAGNOSIS — M1710 Unilateral primary osteoarthritis, unspecified knee: Secondary | ICD-10-CM

## 2010-09-13 DIAGNOSIS — M25562 Pain in left knee: Secondary | ICD-10-CM

## 2010-09-13 DIAGNOSIS — M25569 Pain in unspecified knee: Secondary | ICD-10-CM

## 2010-09-13 NOTE — Progress Notes (Signed)
60 year old female: Dr. Milinda Antis requested a consult for evaluation of right knee pain.  R > L for many years. Before she moved to [place].  Pain with walking down stairs, stiff with running.  Will not want to bedn her knee doing that.  Worked in the [employer] for many years  Fish oil and flax seed oil  Patient presents with many year h/o R > L sided knee pain after no specific injury. The patient has not had an effusion. No symptomatic giving-way. + aiduble mechanical clicking / crepitus. Joint has not locked up. Patient has been able to walk with no limp. The patient does have pain going up and down stairs or rising from a seated position.   Pain location: anterior, medial, and lateral Current physical activity: weights, ellipitical Prior Knee Surgery: none Current pain meds: none Bracing: none Occupation or school level:  The PMH, PSH, Social History, Family History, Medications, and allergies have been reviewed in [facility], and have been updated if relevant.  GEN: Well-developed,well-nourished,in no acute distress; alert,appropriate and cooperative throughout examination HEENT: Normocephalic and atraumatic without obvious abnormalities. Ears, externally no deformities PULM: Breathing comfortably in no respiratory distress EXT: No clubbing, cyanosis, or edema PSYCH: Normally interactive. Cooperative during the interview. Pleasant. Friendly and conversant. Not anxious or depressed appearing. Normal, full affect.  Gait: Normal heel toe pattern ROM: 0 - 128 Effusion: neg Echymosis or edema: none Patellar tendon NT Painful PLICA: neg Patellar grind: LOUD B GRIND, NO PAIN Medial and lateral patellar facet loading: negative medial and lateral joint lines:NT Mcmurray's neg Flexion-pinch neg Varus and valgus stress: stable Lachman: neg Ant and Post drawer: neg Hip abduction, IR, ER: WNL Hip flexion str: 5/5 Hip abd: 5/5 Quad: 5/5 VMO atrophy:No Hamstring concentric and eccentric:  5/5  A/P: Knee Pain, Clinically, OA B Strength is outstanding for age and weight - a testament to her career and habits in gym and i think are protecting her joint.  Recommendations: I reviewed with the patient a handout from [employer] and [facility] regarding their condition and a set of home exercises regarding knee OA. Minimally symptomatic except with PF joint loading. I would do little else at this point except prn tylenol or nsaid with acute exacerbation. Cont to work on weight loss.

## 2010-10-24 ENCOUNTER — Telehealth: Payer: Self-pay | Admitting: *Deleted

## 2010-10-24 MED ORDER — METFORMIN HCL 500 MG PO TABS: ORAL_TABLET | ORAL | Status: DC

## 2010-10-24 NOTE — Telephone Encounter (Signed)
Thanks - I reviewed them  Looks like am sugars are high Increase pm metformin to 500 mg please (keep am at 250) If low sugar or side eff let me know  Keep appt for august  Px written for call in

## 2010-10-24 NOTE — Telephone Encounter (Signed)
Left message on machine for patient to call back.

## 2010-10-24 NOTE — Telephone Encounter (Signed)
Pt has brought in her blood sugar readings for the last 2 weeks, these are on your shelf.

## 2010-10-25 MED ORDER — METFORMIN HCL 500 MG PO TABS: ORAL_TABLET | ORAL | Status: DC

## 2010-10-25 NOTE — Telephone Encounter (Signed)
Patient returned call and was notified. Rx sent to pharmacy.

## 2010-12-08 ENCOUNTER — Telehealth: Payer: Self-pay | Admitting: Family Medicine

## 2010-12-08 DIAGNOSIS — I10 Essential (primary) hypertension: Secondary | ICD-10-CM

## 2010-12-08 DIAGNOSIS — Z0000 Encounter for general adult medical examination without abnormal findings: Secondary | ICD-10-CM | POA: Insufficient documentation

## 2010-12-08 DIAGNOSIS — E119 Type 2 diabetes mellitus without complications: Secondary | ICD-10-CM

## 2010-12-08 NOTE — Telephone Encounter (Signed)
Message copied by Judy Pimple on Sat Dec 08, 2010  9:52 PM ------      Message from: Baldomero Lamy      Created: Fri Dec 07, 2010  9:25 AM      Regarding: cpx labs thurs 8/9       Please order  future cpx labs for pt's upcomming lab appt.      Thanks      Rodney Booze

## 2010-12-13 ENCOUNTER — Other Ambulatory Visit (INDEPENDENT_AMBULATORY_CARE_PROVIDER_SITE_OTHER): Payer: 59

## 2010-12-13 DIAGNOSIS — Z0000 Encounter for general adult medical examination without abnormal findings: Secondary | ICD-10-CM

## 2010-12-13 DIAGNOSIS — E119 Type 2 diabetes mellitus without complications: Secondary | ICD-10-CM

## 2010-12-13 DIAGNOSIS — I10 Essential (primary) hypertension: Secondary | ICD-10-CM

## 2010-12-13 LAB — COMPREHENSIVE METABOLIC PANEL
Albumin: 4.1 g/dL (ref 3.5–5.2)
BUN: 21 mg/dL (ref 6–23)
CO2: 29 mEq/L (ref 19–32)
Calcium: 9.1 mg/dL (ref 8.4–10.5)
Chloride: 104 mEq/L (ref 96–112)
Creatinine, Ser: 1 mg/dL (ref 0.4–1.2)
GFR: 73.94 mL/min (ref >60.00)
Glucose, Bld: 135 mg/dL — ABNORMAL HIGH (ref 70–99)
Potassium: 3.9 mEq/L (ref 3.5–5.1)

## 2010-12-13 LAB — CBC WITH DIFFERENTIAL/PLATELET
Basophils Relative: 1.7 % (ref 0.0–3.0)
Eosinophils Relative: 0.7 % (ref 0.0–5.0)
Hemoglobin: 13.1 g/dL (ref 12.0–15.0)
Lymphocytes Relative: 52.8 % — ABNORMAL HIGH (ref 12.0–46.0)
Neutro Abs: 2.6 10*3/uL (ref 1.4–7.7)
Neutrophils Relative %: 39.7 % — ABNORMAL LOW (ref 43.0–77.0)
RBC: 4.54 Mil/uL (ref 3.87–5.11)
WBC: 6.5 10*3/uL (ref 4.5–10.5)

## 2010-12-13 LAB — TSH: TSH: 0.83 u[IU]/mL (ref 0.35–5.50)

## 2010-12-13 LAB — LIPID PANEL
Cholesterol: 161 mg/dL (ref 0–200)
LDL Cholesterol: 79 mg/dL (ref 0–99)

## 2010-12-18 ENCOUNTER — Ambulatory Visit (INDEPENDENT_AMBULATORY_CARE_PROVIDER_SITE_OTHER): Payer: 59 | Admitting: Family Medicine

## 2010-12-18 ENCOUNTER — Encounter: Payer: Self-pay | Admitting: Family Medicine

## 2010-12-18 DIAGNOSIS — Z23 Encounter for immunization: Secondary | ICD-10-CM

## 2010-12-18 DIAGNOSIS — E119 Type 2 diabetes mellitus without complications: Secondary | ICD-10-CM

## 2010-12-18 DIAGNOSIS — Z0000 Encounter for general adult medical examination without abnormal findings: Secondary | ICD-10-CM

## 2010-12-18 DIAGNOSIS — Z1231 Encounter for screening mammogram for malignant neoplasm of breast: Secondary | ICD-10-CM

## 2010-12-18 DIAGNOSIS — I10 Essential (primary) hypertension: Secondary | ICD-10-CM

## 2010-12-18 NOTE — Assessment & Plan Note (Signed)
Good control with current meds and lifestyle  On ace  Rev labs with pt today F/u dec

## 2010-12-18 NOTE — Assessment & Plan Note (Signed)
Nl breast exam today Mother had b ca Enc regular self exams  Mam sched

## 2010-12-18 NOTE — Assessment & Plan Note (Signed)
Reviewed health habits including diet and exercise and skin cancer prevention Also reviewed health mt list, fam hx and immunizations   Urged to keep up the good work with lifestyle change Rev wellness labs in detail

## 2010-12-18 NOTE — Assessment & Plan Note (Signed)
Slowly improving but gained wt on a cruise Disc strategies for DM diet with travel Pt will work on wt loss again  She will schedule own eye exam also  a1c and f/u when back in dec

## 2010-12-18 NOTE — Progress Notes (Signed)
Subjective:    Patient ID: [name], female    DOB: [birth year], 60 y.o.   MRN: [id number]  HPI Here for annual health mt exam and also to review chronic med problems Is feeling great in general   Was on a 2 week cruise and her eating was not great  Big  Birthday celebrate   Obese with 2 lb wt gain  (she los and then gained again -- 11 lb)    hyst tot in past for fibroids- no symptoms at all  Mam 5.11 normal - is overdue for that  Self exam -- no lumps or changes   Zoster status - is interested in vaccine  Td 4/11 Pneumovax- never had   colonosc 10/11 - non adenomatous polyp so call back is 10 years   DM a1c is 7.3 down from 7.8  After checking sugars did inc her pm metformin to 500 mg -- is doing fine with that and getting back to regular eating  Is counting calories-- 1500 per day  3 days per week - 3 mil walk and run  Other days elliptical 45 min  opthy-- missed her last exam - she will make her own appt Dr Senaida Ores    Lipids are ok  Lab Results  Component Value Date   CHOL 161 12/13/2010   CHOL 144 09/06/2010   CHOL 175 08/29/2009   Lab Results  Component Value Date   HDL 65.90 12/13/2010   HDL 51.20 09/06/2010   HDL 47.00 08/29/2009   Lab Results  Component Value Date   LDLCALC 79 12/13/2010   LDLCALC 76 09/06/2010   LDLCALC 106* 08/29/2009   Lab Results  Component Value Date   TRIG 80.0 12/13/2010   TRIG 86.0 09/06/2010   TRIG 110.0 08/29/2009   Lab Results  Component Value Date   CHOLHDL 2 12/13/2010   CHOLHDL 3 09/06/2010   CHOLHDL 4 08/29/2009   No results found for this basename: LDLDIRECT   diet- very good about watching sat fats   Patient Active Problem List  Diagnoses  . DIABETES-TYPE 2  . HYPERTENSION  . Obesity  . Knee pain, bilateral  . Osteoarthritis of knee  . Routine general medical examination at a health care facility  . Other screening mammogram   Past Medical History  Diagnosis Date  . HTN (hypertension)   . Diabetes mellitus type II    . Obesity    Past Surgical History  Procedure Date  . Total abdominal hysterectomy 10/1999    fibroids  . Combined abdominoplasty and liposuction 07/2000   History  Substance Use Topics  . Smoking status: Never Smoker   . Smokeless tobacco: Not on file  . Alcohol Use: No   Family History  Problem Relation Age of Onset  . Arthritis Mother   . Hypertension Mother   . Cancer Mother     breast, lung  . Diabetes Mother    No Known Allergies Current Outpatient Prescriptions on File Prior to Visit  Medication Sig Dispense Refill  . fish oil-omega-3 fatty acids 1000 MG capsule Take 1 g by mouth daily.        . Flaxseed, Linseed, OIL Take 2 capsules by mouth daily.        Marland Kitchen glucose blood test strip 1 each by Other route daily. Use as instructed       . glucose monitoring kit (FREESTYLE) monitoring kit 1 each by Does not apply route daily.        Marland Kitchen  Lancets 28G MISC by Does not apply route daily.        Marland Kitchen lisinopril-hydrochlorothiazide (PRINZIDE,ZESTORETIC) 20-12.5 MG per tablet Take 2 tablets by mouth daily.        . metFORMIN (GLUCOPHAGE) 500 MG tablet Take 1/2 pill by mouth in am and 1 pill by mouth in pm  45 tablet  11  . Multiple Vitamin (MULTIVITAMIN) tablet Take 1 tablet by mouth daily.               Review of Systems Review of Systems  Constitutional: Negative for fever, appetite change, fatigue and unexpected weight change.  Eyes: Negative for pain and visual disturbance.  Respiratory: Negative for cough and shortness of breath.   Cardiovascular: Negative for cp or palp Endo neg for increased thirst or urination .   Gastrointestinal: Negative for nausea, diarrhea and constipation.  Genitourinary: Negative for urgency and frequency.  Skin: Negative for pallor. or rash  Neurological: Negative for weakness, light-headedness, numbness and headaches.  Hematological: Negative for adenopathy. Does not bruise/bleed easily.  Psychiatric/Behavioral: Negative for dysphoric  mood. The patient is not nervous/anxious.          Objective:   Physical Exam  Constitutional: She appears well-nourished. No distress.       overwt and well appearing   HENT:  Head: Normocephalic and atraumatic.  Right Ear: External ear normal.  Left Ear: External ear normal.  Nose: Nose normal.  Eyes: Conjunctivae and EOM are normal. Pupils are equal, round, and reactive to light.  Neck: Normal range of motion. Neck supple. No JVD present. Carotid bruit is not present. No thyromegaly present.  Cardiovascular: Normal rate, regular rhythm, normal heart sounds and intact distal pulses.   Pulmonary/Chest: Effort normal and breath sounds normal. No respiratory distress. She has no wheezes.  Abdominal: Soft. Bowel sounds are normal. She exhibits no distension and no mass. There is no tenderness. There is no rebound.  Genitourinary: No breast swelling, tenderness, discharge or bleeding.  Musculoskeletal: Normal range of motion. She exhibits no edema and no tenderness.  Lymphadenopathy:    She has no cervical adenopathy.  Neurological: She is alert. She has normal reflexes. No cranial nerve deficit. Coordination normal.  Skin: Skin is warm and dry. No rash noted. No erythema. No pallor.  Psychiatric: She has a normal mood and affect.          Assessment & Plan:

## 2010-12-18 NOTE — Patient Instructions (Signed)
Don't forget to make your annual eye doctor appt  We will refer you for a mammogram at check out If you are interested in shingles vaccine in future - call your insurance company to see how coverage is and call us to schedule  You will have a pneumonia vaccine today Keep working hard on healthy diet and exercise Schedule lab and follow up in 3months please

## 2011-02-06 ENCOUNTER — Ambulatory Visit: Payer: Self-pay | Admitting: Family Medicine

## 2011-02-11 ENCOUNTER — Encounter: Payer: Self-pay | Admitting: Family Medicine

## 2011-02-15 ENCOUNTER — Encounter: Payer: Self-pay | Admitting: *Deleted

## 2011-04-16 ENCOUNTER — Other Ambulatory Visit (INDEPENDENT_AMBULATORY_CARE_PROVIDER_SITE_OTHER): Payer: 59

## 2011-04-16 DIAGNOSIS — E119 Type 2 diabetes mellitus without complications: Secondary | ICD-10-CM

## 2011-04-19 ENCOUNTER — Encounter: Payer: Self-pay | Admitting: Family Medicine

## 2011-04-19 ENCOUNTER — Ambulatory Visit (INDEPENDENT_AMBULATORY_CARE_PROVIDER_SITE_OTHER): Payer: 59 | Admitting: Family Medicine

## 2011-04-19 VITALS — BP 116/78 | HR 64 | Temp 97.5°F | Ht 61.0 in | Wt 211.8 lb

## 2011-04-19 DIAGNOSIS — Z23 Encounter for immunization: Secondary | ICD-10-CM

## 2011-04-19 DIAGNOSIS — M25569 Pain in unspecified knee: Secondary | ICD-10-CM

## 2011-04-19 DIAGNOSIS — E119 Type 2 diabetes mellitus without complications: Secondary | ICD-10-CM

## 2011-04-19 DIAGNOSIS — I10 Essential (primary) hypertension: Secondary | ICD-10-CM

## 2011-04-19 DIAGNOSIS — M25561 Pain in right knee: Secondary | ICD-10-CM

## 2011-04-19 DIAGNOSIS — E669 Obesity, unspecified: Secondary | ICD-10-CM

## 2011-04-19 MED ORDER — METFORMIN HCL 500 MG PO TABS: ORAL_TABLET | ORAL | Status: DC

## 2011-04-19 NOTE — Assessment & Plan Note (Signed)
Still having problems and wants to start exercise  Ref to ortho ? OA

## 2011-04-19 NOTE — Assessment & Plan Note (Signed)
Not in good control at this time Lost motivation but getting back to it  Starting exercise  Rev a1c  Inc metformin to 1 bid with sugar checks bid  Ref opthy  On ace  F/u 4-6 wk to rev her sugars  Flu shot today

## 2011-04-19 NOTE — Assessment & Plan Note (Signed)
Made long term plan for calorie restriction/ exercise Wt loss would help all health problems

## 2011-04-19 NOTE — Assessment & Plan Note (Signed)
bp in fair control at this time  No changes needed  Disc lifstyle change with low sodium diet and exercise   

## 2011-04-19 NOTE — Progress Notes (Signed)
Subjective:    Patient ID: [name], female    DOB: Aug 26, 1950, 60 y.o.   MRN: [id number]  HPI Here for f/u of DM and HTN and obesity   L knee still hurts -- with OA- wants to see ortho  Desires ref today  Nothing has helped so far    bp is   116/78  Today Great control On ace   Chemistry      Component Value Date/Time   NA 141 12/13/2010 0838   K 3.9 12/13/2010 0838   CL 104 12/13/2010 0838   CO2 29 12/13/2010 0838   BUN 21 12/13/2010 0838   CREATININE 1.0 12/13/2010 0838      Component Value Date/Time   CALCIUM 9.1 12/13/2010 0838   ALKPHOS 73 12/13/2010 0838   AST 19 12/13/2010 0838   ALT 15 12/13/2010 0838   BILITOT 0.4 12/13/2010 0838     No cp or palpitations or headaches or edema  No side effects to medicines    DM Lab Results  Component Value Date   HGBA1C 7.8* 04/16/2011   this is up  opthy-- no blurriness - needs to make appt  On ace  Foot care  Metformin -- sugar is higher in am - takes 1/2 in am and 1 in eve  Home sugar checks has not been checking them   Obesity  Same wt with bmi of 40  Had prev gained wt on a cruise heatlh is not a priority right now  No longer eating sweets , just possibly eating too much  Diet -has not been eating DM diet due to the holiday  Ex[facility]-- [facility] [employer] -- will [facility] today   Lipids good  Lab Results  Component Value Date   CHOL 161 12/13/2010   HDL 65.90 12/13/2010   LDLCALC 79 12/13/2010   TRIG 80.0 12/13/2010   CHOLHDL 2 12/13/2010    Patient Active Problem List  Diagnoses  . DIABETES-TYPE 2  . HYPERTENSION  . Obesity  . Knee pain, bilateral  . Osteoarthritis of knee  . Routine general medical examination at a health care facility  . Other screening mammogram   Past Medical History  Diagnosis Date  . HTN (hypertension)   . Diabetes mellitus type II   . Obesity    Past Surgical History  Procedure Date  . Total abdominal hysterectomy 10/1999    fibroids  . Combined abdominoplasty and liposuction 07/2000   History   Substance Use Topics  . Smoking status: Never Smoker   . Smokeless tobacco: Not on file  . Alcohol Use: No   Family History  Problem Relation Age of Onset  . Arthritis Mother   . Hypertension Mother   . Cancer Mother     breast, lung  . Diabetes Mother    No Known Allergies Current Outpatient Prescriptions on File Prior to Visit  Medication Sig Dispense Refill  . fish oil-omega-3 fatty acids 1000 MG capsule Take 1 g by mouth daily.        . Flaxseed, Linseed, OIL Take 2 capsules by mouth daily.        Marland Kitchen glucose blood test strip 1 each by Other route daily. Use as instructed       . glucose monitoring kit (FREESTYLE) monitoring kit 1 each by Does not apply route daily.        . Lancets 28G MISC by Does not apply route daily.        Marland Kitchen  lisinopril-hydrochlorothiazide (PRINZIDE,ZESTORETIC) 20-12.5 MG per tablet Take 2 tablets by mouth daily.        . Multiple Vitamin (MULTIVITAMIN) tablet Take 1 tablet by mouth daily.           Review of Systems Review of Systems  Constitutional: Negative for fever, appetite change, fatigue and unexpected weight change.  Eyes: Negative for pain and visual disturbance.  Respiratory: Negative for cough and shortness of breath.   Cardiovascular: Negative for cp or palpitations    Gastrointestinal: Negative for nausea, diarrhea and constipation.  Genitourinary: Negative for urgency and frequency. no excessive thirst or urination Skin: Negative for pallor or rash   MSK pos for knee pain  Neurological: Negative for weakness, light-headedness, numbness and headaches.  Hematological: Negative for adenopathy. Does not bruise/bleed easily.  Psychiatric/Behavioral: Negative for dysphoric mood. The patient is not nervous/anxious.          Objective:   Physical Exam  Constitutional: She appears well-developed and well-nourished. No distress.  HENT:  Head: Normocephalic and atraumatic.  Mouth/Throat: Oropharynx is clear and moist.  Eyes: Conjunctivae  and EOM are normal. Pupils are equal, round, and reactive to light. No scleral icterus.  Neck: Normal range of motion. Neck supple. No JVD present. Carotid bruit is not present. Erythema present. No thyromegaly present.  Cardiovascular: Normal rate, regular rhythm, normal heart sounds and intact distal pulses.  Exam reveals no gallop.   Pulmonary/Chest: Effort normal and breath sounds normal. No respiratory distress. She has no wheezes. She exhibits no tenderness.  Abdominal: Soft. Bowel sounds are normal. She exhibits no distension, no abdominal bruit and no mass. There is no tenderness.  Musculoskeletal: Normal range of motion. She exhibits tenderness. She exhibits no edema.       Poor rom knees Medial tenderness L knee  Lymphadenopathy:    She has no cervical adenopathy.  Neurological: She is alert. She has normal reflexes. No cranial nerve deficit. She exhibits normal muscle tone. Coordination normal.  Skin: Skin is warm and dry.  Psychiatric: She has a normal mood and affect.          Assessment & Plan:

## 2011-04-19 NOTE — Patient Instructions (Signed)
Flu shot today  Aim for exercise 5 days per week - work up to it  We will refer you to eye doctor at check out  We will also refer you to ortho for knee pain Increase your metformin to 1 pill twice daily  Check sugar fasting am and 2 hours after later meal -- keep a log  Follow up with me in 4-6 weeks

## 2011-04-26 ENCOUNTER — Other Ambulatory Visit: Payer: Self-pay | Admitting: *Deleted

## 2011-04-26 ENCOUNTER — Ambulatory Visit: Payer: Self-pay | Admitting: Orthopedic Surgery

## 2011-04-26 MED ORDER — GLUCOSE BLOOD VI STRP: ORAL_STRIP | Status: DC

## 2011-05-31 ENCOUNTER — Ambulatory Visit (INDEPENDENT_AMBULATORY_CARE_PROVIDER_SITE_OTHER): Payer: 59 | Admitting: Family Medicine

## 2011-05-31 ENCOUNTER — Encounter: Payer: Self-pay | Admitting: Family Medicine

## 2011-05-31 VITALS — BP 110/76 | HR 72 | Temp 98.0°F | Ht 61.0 in | Wt 210.8 lb

## 2011-05-31 DIAGNOSIS — I10 Essential (primary) hypertension: Secondary | ICD-10-CM

## 2011-05-31 DIAGNOSIS — E119 Type 2 diabetes mellitus without complications: Secondary | ICD-10-CM

## 2011-05-31 DIAGNOSIS — E669 Obesity, unspecified: Secondary | ICD-10-CM

## 2011-05-31 MED ORDER — METFORMIN HCL 1000 MG PO TABS: 1000.0000 mg | ORAL_TABLET | Freq: Two times a day (BID) | ORAL | Status: DC

## 2011-05-31 NOTE — Patient Instructions (Signed)
Increase metformin to 1000 mg twice daily  If any problems let me know  Stick to diabetic diet  Check sugar- some am and some 2 hours after meal Keep up exercise  Stop the night time snacking Labs and follow up in 3 months please

## 2011-05-31 NOTE — Assessment & Plan Note (Signed)
a1c up to 7.8 Needs to start checking some pp sugars and follow DM diet  No symptoms Will inc metformin to 1000 bid and lab/ f/u 3 mo

## 2011-05-31 NOTE — Progress Notes (Signed)
Subjective:    Patient ID: [name], female    DOB: 1951/03/09, 61 y.o.   MRN: [id number]  HPI Here for f/u of DM and HTN   bp is  110/76   Today No cp or palpitations or headaches or edema  No side effects to medicines    a1c is up to 7.8 On metformin 500 bid  Getting worse  Wt is down  1 lb with bmi of 39- is obese and aware of the risks   Knows her diet could be better - sometimes motivated and some times not  Am readings are running 130s- 160s (eats junk at night -- bedtime)  Pm- not bad  Overall her sugars do not correlate with her a1c- but not really checking PP  Has not tried a formal weight loss program Not on ace due to baseline low bp  No thirst/ numbness   Diet is good and bad - knows what to eat/ had DM ed  Exercise is 3 or more days per week - [facility] and free weights , elliptical or treadmill   Patient Active Problem List  Diagnoses  . DIABETES-TYPE 2  . HYPERTENSION  . Obesity  . Knee pain, bilateral  . Osteoarthritis of knee  . Routine general medical examination at a health care facility  . Other screening mammogram   Past Medical History  Diagnosis Date  . HTN (hypertension)   . Diabetes mellitus type II   . Obesity    Past Surgical History  Procedure Date  . Total abdominal hysterectomy 10/1999    fibroids  . Combined abdominoplasty and liposuction 07/2000   History  Substance Use Topics  . Smoking status: Never Smoker   . Smokeless tobacco: Not on file  . Alcohol Use: No   Family History  Problem Relation Age of Onset  . Arthritis Mother   . Hypertension Mother   . Cancer Mother     breast, lung  . Diabetes Mother    No Known Allergies Current Outpatient Prescriptions on File Prior to Visit  Medication Sig Dispense Refill  . fish oil-omega-3 fatty acids 1000 MG capsule Take 1 g by mouth daily.        . Flaxseed, Linseed, OIL Take 2 capsules by mouth daily.        Marland Kitchen glucose blood test strip Use as instructed   100 each  6  . glucose monitoring kit (FREESTYLE) monitoring kit 1 each by Does not apply route daily.        . Lancets 28G MISC by Does not apply route daily.        Marland Kitchen lisinopril-hydrochlorothiazide (PRINZIDE,ZESTORETIC) 20-12.5 MG per tablet Take 2 tablets by mouth daily.        . Multiple Vitamin (MULTIVITAMIN) tablet Take 1 tablet by mouth daily.             Review of Systems Review of Systems  Constitutional: Negative for fever, appetite change, fatigue and unexpected weight change.  Eyes: Negative for pain and visual disturbance.  Respiratory: Negative for cough and shortness of breath.   Cardiovascular: Negative for cp or palpitations    Gastrointestinal: Negative for nausea, diarrhea and constipation.  Genitourinary: Negative for urgency and frequency.  Skin: Negative for pallor or rash   Neurological: Negative for weakness, light-headedness, numbness and headaches.  Hematological: Negative for adenopathy. Does not bruise/bleed easily.  Psychiatric/Behavioral: Negative for dysphoric mood. The patient is not nervous/anxious.  Objective:   Physical Exam  Constitutional: She appears well-developed and well-nourished. No distress.       Obese and well appearing   HENT:  Head: Normocephalic and atraumatic.  Mouth/Throat: Oropharynx is clear and moist.  Eyes: Conjunctivae and EOM are normal. Pupils are equal, round, and reactive to light. No scleral icterus.  Neck: Normal range of motion. Neck supple. No JVD present. Carotid bruit is not present. No thyromegaly present.  Cardiovascular: Normal rate, regular rhythm, normal heart sounds and intact distal pulses.   Pulmonary/Chest: Effort normal and breath sounds normal. No respiratory distress. She has no wheezes.  Abdominal: Soft. Bowel sounds are normal. She exhibits no distension, no abdominal bruit and no mass. There is no tenderness.  Musculoskeletal: She exhibits no edema and no tenderness.  Lymphadenopathy:     She has no cervical adenopathy.  Neurological: She is alert. She has normal reflexes. No cranial nerve deficit. She exhibits normal muscle tone. Coordination normal.  Skin: Skin is warm and dry. No rash noted. No erythema. No pallor.  Psychiatric: She has a normal mood and affect.       Cheerful and talkative           Assessment & Plan:

## 2011-05-31 NOTE — Assessment & Plan Note (Signed)
bp in fair control at this time  No changes needed  Disc lifstyle change with low sodium diet and exercise   

## 2011-06-02 NOTE — Assessment & Plan Note (Signed)
Discussed how this problem influences overall health and the risks it imposes  Reviewed plan for weight loss with lower calorie diet (via better food choices and also portion control or program like weight watchers) and exercise building up to or more than 30 minutes 5 days per week including some aerobic activity    

## 2011-06-20 ENCOUNTER — Other Ambulatory Visit: Payer: Self-pay

## 2011-06-20 MED ORDER — LISINOPRIL-HYDROCHLOROTHIAZIDE 20-12.5 MG PO TABS: 2.0000 | ORAL_TABLET | Freq: Every day | ORAL | Status: DC

## 2011-06-20 NOTE — Telephone Encounter (Signed)
[facility] faxed request Lisinopril/HCTZ 20-12.5 mg #60 x 11.

## 2011-08-27 ENCOUNTER — Other Ambulatory Visit (INDEPENDENT_AMBULATORY_CARE_PROVIDER_SITE_OTHER): Payer: 59

## 2011-08-27 DIAGNOSIS — I10 Essential (primary) hypertension: Secondary | ICD-10-CM

## 2011-08-27 DIAGNOSIS — E119 Type 2 diabetes mellitus without complications: Secondary | ICD-10-CM

## 2011-08-27 LAB — RENAL FUNCTION PANEL
Albumin: 4.1 g/dL (ref 3.5–5.2)
BUN: 18 mg/dL (ref 6–23)
Chloride: 103 mEq/L (ref 96–112)
GFR: 81.39 mL/min (ref >60.00)
Glucose, Bld: 112 mg/dL — ABNORMAL HIGH (ref 70–99)
Phosphorus: 3.2 mg/dL (ref 2.3–4.6)
Potassium: 4.2 mEq/L (ref 3.5–5.1)

## 2011-08-27 LAB — HEMOGLOBIN A1C: Hgb A1c MFr Bld: 7.1 % — ABNORMAL HIGH (ref 4.6–6.5)

## 2011-08-30 ENCOUNTER — Encounter: Payer: Self-pay | Admitting: Family Medicine

## 2011-08-30 ENCOUNTER — Ambulatory Visit (INDEPENDENT_AMBULATORY_CARE_PROVIDER_SITE_OTHER): Payer: 59 | Admitting: Family Medicine

## 2011-08-30 VITALS — BP 118/78 | HR 64 | Temp 97.9°F | Ht 61.0 in | Wt 208.2 lb

## 2011-08-30 DIAGNOSIS — I10 Essential (primary) hypertension: Secondary | ICD-10-CM

## 2011-08-30 DIAGNOSIS — E119 Type 2 diabetes mellitus without complications: Secondary | ICD-10-CM

## 2011-08-30 NOTE — Progress Notes (Signed)
Subjective:    Patient ID: [name], female    DOB: July 13, 1950, 61 y.o.   MRN: [id number]  HPI Here for f/u of DM and HTN  Is feeling fine   Wt iw down 2 lb with bmi of 39   bp is 118/78    Today No cp or palpitations or headaches or edema  No side effects to medicines    Diabetes Home sugar results  DM diet -not great - went through potato chip craving and then ate some pizza -- for several days Realized she cannot handle very rich food  Exercise - doing very well from that , elliptical and Y with water aerobics , (less walking due to OA of knees)  Symptoms- none  A1C last  7.1 - down from 7.8 No problems with medications - did inc metformin to 1000 bid last time - fine with that - not making her constipated Because eating more fiber  Renal protection on ace  Last eye exam - went for exam/ no retinopathy -- feb    Patient Active Problem List  Diagnoses  . DIABETES-TYPE 2  . HYPERTENSION  . Obesity  . Knee pain, bilateral  . Osteoarthritis of knee  . Routine general medical examination at a health care facility  . Other screening mammogram   Past Medical History  Diagnosis Date  . HTN (hypertension)   . Diabetes mellitus type II   . Obesity    Past Surgical History  Procedure Date  . Total abdominal hysterectomy 10/1999    fibroids  . Combined abdominoplasty and liposuction 07/2000   History  Substance Use Topics  . Smoking status: Never Smoker   . Smokeless tobacco: Not on file  . Alcohol Use: No   Family History  Problem Relation Age of Onset  . Arthritis Mother   . Hypertension Mother   . Cancer Mother     breast, lung  . Diabetes Mother    No Known Allergies Current Outpatient Prescriptions on File Prior to Visit  Medication Sig Dispense Refill  . fish oil-omega-3 fatty acids 1000 MG capsule Take 1 g by mouth daily.        . Flaxseed, Linseed, OIL Take 2 capsules by mouth daily.        Marland Kitchen glucose blood test strip Use as instructed  100 each   6  . glucose monitoring kit (FREESTYLE) monitoring kit 1 each by Does not apply route daily.        . Lancets 28G MISC by Does not apply route daily.        Marland Kitchen lisinopril-hydrochlorothiazide (PRINZIDE,ZESTORETIC) 20-12.5 MG per tablet Take 2 tablets by mouth daily.  60 tablet  11  . metFORMIN (GLUCOPHAGE) 1000 MG tablet Take 1 tablet (1,000 mg total) by mouth 2 (two) times daily with a meal.  60 tablet  11  . Multiple Vitamin (MULTIVITAMIN) tablet Take 1 tablet by mouth daily.               Review of Systems Review of Systems  Constitutional: Negative for fever, appetite change, fatigue and unexpected weight change.  Eyes: Negative for pain and visual disturbance.  Respiratory: Negative for cough and shortness of breath.   Cardiovascular: Negative for cp or palpitations    Gastrointestinal: Negative for nausea, diarrhea and constipation.  Genitourinary: Negative for urgency and frequency.  Skin: Negative for pallor or rash   Neurological: Negative for weakness, light-headedness, numbness and headaches.  Hematological: Negative for adenopathy. Does  not bruise/bleed easily.  Psychiatric/Behavioral: Negative for dysphoric mood. The patient is not nervous/anxious.          Objective:   Physical Exam  Constitutional: She appears well-developed and well-nourished. No distress.       Obese and well appearing   HENT:  Head: Normocephalic and atraumatic.  Mouth/Throat: Oropharynx is clear and moist.  Eyes: Conjunctivae and EOM are normal. Pupils are equal, round, and reactive to light. No scleral icterus.  Neck: Normal range of motion. Neck supple. Carotid bruit is not present. No thyromegaly present.  Cardiovascular: Normal rate, regular rhythm, normal heart sounds and intact distal pulses.  Exam reveals no gallop.   Pulmonary/Chest: Effort normal and breath sounds normal. No respiratory distress. She has no wheezes.  Abdominal: Soft. Bowel sounds are normal. She exhibits no distension,  no abdominal bruit and no mass. There is no tenderness.  Musculoskeletal: She exhibits no edema and no tenderness.  Lymphadenopathy:    She has no cervical adenopathy.  Neurological: She is alert. She has normal reflexes. No cranial nerve deficit. She exhibits normal muscle tone. Coordination normal.  Skin: Skin is warm and dry. No rash noted. No erythema. No pallor.  Psychiatric: She has a normal mood and affect.          Assessment & Plan:

## 2011-08-30 NOTE — Assessment & Plan Note (Signed)
Doing better with a1c down to 7.1 Tolerates metformin 1000 bid  Working on diet/ exercise is great  Disc imp of wt loss  F/u 6 mo with labs prior for annual exam

## 2011-08-30 NOTE — Assessment & Plan Note (Signed)
bp in fair control at this time  No changes needed  Disc lifstyle change with low sodium diet and exercise   

## 2011-08-30 NOTE — Patient Instructions (Signed)
Schedule follow up in 6 months for an annual exam with labs prior  Sugar control is better - stick with the diet and exercise and work on weight loss  No change in medicines

## 2012-02-26 ENCOUNTER — Telehealth: Payer: Self-pay | Admitting: Family Medicine

## 2012-02-26 ENCOUNTER — Other Ambulatory Visit (INDEPENDENT_AMBULATORY_CARE_PROVIDER_SITE_OTHER): Payer: 59

## 2012-02-26 DIAGNOSIS — E119 Type 2 diabetes mellitus without complications: Secondary | ICD-10-CM

## 2012-02-26 DIAGNOSIS — Z0000 Encounter for general adult medical examination without abnormal findings: Secondary | ICD-10-CM

## 2012-02-26 LAB — LIPID PANEL
Cholesterol: 151 mg/dL (ref 0–200)
HDL: 46.2 mg/dL (ref >39.00)
VLDL: 17.6 mg/dL (ref 0.0–40.0)

## 2012-02-26 LAB — CBC WITH DIFFERENTIAL/PLATELET
Basophils Absolute: 0 10*3/uL (ref 0.0–0.1)
Eosinophils Absolute: 0 10*3/uL (ref 0.0–0.7)
Lymphocytes Relative: 56.8 % — ABNORMAL HIGH (ref 12.0–46.0)
MCHC: 31.6 g/dL (ref 30.0–36.0)
Monocytes Relative: 4.7 % (ref 3.0–12.0)
Neutrophils Relative %: 37.8 % — ABNORMAL LOW (ref 43.0–77.0)
RBC: 4.25 Mil/uL (ref 3.87–5.11)
RDW: 13.4 % (ref 11.5–14.6)

## 2012-02-26 LAB — COMPREHENSIVE METABOLIC PANEL
ALT: 12 U/L (ref 0–35)
AST: 15 U/L (ref 0–37)
Albumin: 3.8 g/dL (ref 3.5–5.2)
CO2: 31 mEq/L (ref 19–32)
Calcium: 9.4 mg/dL (ref 8.4–10.5)
Chloride: 101 mEq/L (ref 96–112)
Potassium: 3.4 mEq/L — ABNORMAL LOW (ref 3.5–5.1)
Total Protein: 7 g/dL (ref 6.0–8.3)

## 2012-02-26 NOTE — Telephone Encounter (Signed)
Message copied by Judy Pimple on Wed Feb 26, 2012  7:41 AM ------      Message from: Alvina Chou      Created: Fri Feb 21, 2012  5:13 PM      Regarding: Lab orders for Wednesday, 10.23.13       Patient is scheduled for CPX labs, please order future labs, Thanks , Camelia Eng

## 2012-02-28 ENCOUNTER — Other Ambulatory Visit: Payer: 59

## 2012-03-06 ENCOUNTER — Encounter: Payer: 59 | Admitting: Family Medicine

## 2012-03-09 ENCOUNTER — Ambulatory Visit (INDEPENDENT_AMBULATORY_CARE_PROVIDER_SITE_OTHER): Payer: 59 | Admitting: Family Medicine

## 2012-03-09 ENCOUNTER — Encounter: Payer: Self-pay | Admitting: Family Medicine

## 2012-03-09 VITALS — BP 116/80 | HR 78 | Temp 98.4°F | Ht 61.0 in | Wt 207.0 lb

## 2012-03-09 DIAGNOSIS — Z1231 Encounter for screening mammogram for malignant neoplasm of breast: Secondary | ICD-10-CM

## 2012-03-09 DIAGNOSIS — L919 Hypertrophic disorder of the skin, unspecified: Secondary | ICD-10-CM

## 2012-03-09 DIAGNOSIS — Z23 Encounter for immunization: Secondary | ICD-10-CM

## 2012-03-09 DIAGNOSIS — I10 Essential (primary) hypertension: Secondary | ICD-10-CM

## 2012-03-09 DIAGNOSIS — E119 Type 2 diabetes mellitus without complications: Secondary | ICD-10-CM

## 2012-03-09 DIAGNOSIS — L918 Other hypertrophic disorders of the skin: Secondary | ICD-10-CM | POA: Insufficient documentation

## 2012-03-09 DIAGNOSIS — Z0000 Encounter for general adult medical examination without abnormal findings: Secondary | ICD-10-CM

## 2012-03-09 NOTE — Progress Notes (Signed)
Subjective:    Patient ID: [name], female    DOB: 1951-01-16, 61 y.o.   MRN: [id number]  HPI Here for health maintenance exam and to review chronic medical problems    Feeling fine- nothing new going on medically  Wants to go to derm to have skin tags removed   Wt is down 1 lb with bmi of 39 in obese range  Has had total hysterectomy For fibroids  No gyn problems or symptoms   Zoster status- is interested in vaccine   Flu vaccine-- needs today   Mammogram 10/12- she goes to [facility] Self exam - no lumps   colonosc 10/11  K is 3.4 this check    Chemistry      Component Value Date/Time   NA 139 02/26/2012 0817   K 3.4* 02/26/2012 0817   CL 101 02/26/2012 0817   CO2 31 02/26/2012 0817   BUN 15 02/26/2012 0817   CREATININE 1.0 02/26/2012 0817      Component Value Date/Time   CALCIUM 9.4 02/26/2012 0817   ALKPHOS 61 02/26/2012 0817   AST 15 02/26/2012 0817   ALT 12 02/26/2012 0817   BILITOT 0.6 02/26/2012 0817     she can get more K in diet , does take a mvi   a1c is 6.9- dm 2 on metformin (down from 7.1) She has been working on this  Has been exercising - elliptical and water aerobics -- wants to loose weight  Trying to stay away from junk food and controlling portions and no more eating late at night  opthy- was in feb -no retinopathy   Lab Results  Component Value Date   CHOL 151 02/26/2012   CHOL 161 12/13/2010   CHOL 144 09/06/2010   Lab Results  Component Value Date   HDL 46.20 02/26/2012   HDL 65.90 12/13/2010   HDL 51.20 09/06/2010   Lab Results  Component Value Date   LDLCALC 87 02/26/2012   LDLCALC 79 12/13/2010   LDLCALC 76 09/06/2010   Lab Results  Component Value Date   TRIG 88.0 02/26/2012   TRIG 80.0 12/13/2010   TRIG 86.0 09/06/2010   Lab Results  Component Value Date   CHOLHDL 3 02/26/2012   CHOLHDL 2 12/13/2010   CHOLHDL 3 09/06/2010   No results found for this basename: LDLDIRECT   at goal for cholesterol   Patient Active Problem List    Diagnosis  . DIABETES-TYPE 2  . HYPERTENSION  . Obesity  . Knee pain, bilateral  . Osteoarthritis of knee  . Routine general medical examination at a health care facility  . Other screening mammogram   Past Medical History  Diagnosis Date  . HTN (hypertension)   . Diabetes mellitus type II   . Obesity    Past Surgical History  Procedure Date  . Total abdominal hysterectomy 10/1999    fibroids  . Combined abdominoplasty and liposuction 07/2000   History  Substance Use Topics  . Smoking status: Never Smoker   . Smokeless tobacco: Not on file  . Alcohol Use: No   Family History  Problem Relation Age of Onset  . Arthritis Mother   . Hypertension Mother   . Cancer Mother     breast, lung  . Diabetes Mother    No Known Allergies Current Outpatient Prescriptions on File Prior to Visit  Medication Sig Dispense Refill  . fish oil-omega-3 fatty acids 1000 MG capsule Take 1 g by mouth daily.        Marland Kitchen  Flaxseed, Linseed, OIL Take 2 capsules by mouth daily.        Marland Kitchen glucose blood test strip Use as instructed  100 each  6  . lisinopril-hydrochlorothiazide (PRINZIDE,ZESTORETIC) 20-12.5 MG per tablet Take 2 tablets by mouth daily.  60 tablet  11  . metFORMIN (GLUCOPHAGE) 1000 MG tablet Take 1 tablet (1,000 mg total) by mouth 2 (two) times daily with a meal.  60 tablet  11  . Multiple Vitamin (MULTIVITAMIN) tablet Take 1 tablet by mouth daily.             Review of Systems Review of Systems  Constitutional: Negative for fever, appetite change, fatigue and unexpected weight change.  Eyes: Negative for pain and visual disturbance.  Respiratory: Negative for cough and shortness of breath.   Cardiovascular: Negative for cp or palpitations    Gastrointestinal: Negative for nausea, diarrhea and constipation.  Genitourinary: Negative for urgency and frequency.  Skin: Negative for pallor or rash   Neurological: Negative for weakness, light-headedness, numbness and headaches.   Hematological: Negative for adenopathy. Does not bruise/bleed easily.  Psychiatric/Behavioral: Negative for dysphoric mood. The patient is not nervous/anxious.         Objective:   Physical Exam  Constitutional: She appears well-developed and well-nourished. No distress.       obese and well appearing   HENT:  Head: Normocephalic and atraumatic.  Right Ear: External ear normal.  Left Ear: External ear normal.  Nose: Nose normal.  Mouth/Throat: Oropharynx is clear and moist.  Eyes: Conjunctivae normal and EOM are normal. Pupils are equal, round, and reactive to light. Right eye exhibits no discharge. Left eye exhibits no discharge. No scleral icterus.  Neck: Normal range of motion. Neck supple. No JVD present. Carotid bruit is not present. No thyromegaly present.  Cardiovascular: Normal rate, regular rhythm, normal heart sounds and intact distal pulses.  Exam reveals no gallop.   Pulmonary/Chest: Effort normal and breath sounds normal. No respiratory distress. She has no wheezes.  Abdominal: Soft. Bowel sounds are normal. She exhibits no distension, no abdominal bruit and no mass. There is no tenderness.  Genitourinary: No breast swelling, tenderness, discharge or bleeding.       Breast exam: No mass, nodules, thickening, tenderness, bulging, retraction, inflamation, nipple discharge or skin changes noted.  No axillary or clavicular LA.  Chaperoned exam.    Musculoskeletal: Normal range of motion. She exhibits no edema and no tenderness.  Lymphadenopathy:    She has no cervical adenopathy.  Neurological: She is alert. She has normal reflexes. No cranial nerve deficit. She exhibits normal muscle tone. Coordination normal.  Skin: Skin is warm and dry. No rash noted. No erythema. No pallor.       Many skin tags on neck/ axillae and under breasts   Psychiatric: She has a normal mood and affect.          Assessment & Plan:

## 2012-03-09 NOTE — Assessment & Plan Note (Signed)
Improved with a1c of 6.9 Doing well with diet and exercise Will continue working on wt loss

## 2012-03-09 NOTE — Assessment & Plan Note (Signed)
bp in fair control at this time  No changes needed  Disc lifstyle change with low sodium diet and exercise   Rev labs with pt  

## 2012-03-09 NOTE — Patient Instructions (Addendum)
Flu vaccine today  Make appt for a nurse visit for a shingles vaccine in 1 month We will schedule your mammogram at check out  We will do dermatology referral at check out  Follow up with me in 6 months with labs prior  Keep up the good work

## 2012-03-09 NOTE — Assessment & Plan Note (Signed)
Reviewed health habits including diet and exercise and skin cancer prevention Also reviewed health mt list, fam hx and immunizations   Rev wellness labs in detail Flu shot today Will return in 41mo for zostavax Mammogram was sched

## 2012-03-09 NOTE — Assessment & Plan Note (Signed)
Scheduled annual screening mammogram Nl breast exam today  Encouraged monthly self exams   

## 2012-03-09 NOTE — Assessment & Plan Note (Signed)
Neck/ under arms and under breasts - pt req derm ref and that was done

## 2012-03-31 ENCOUNTER — Encounter: Payer: Self-pay | Admitting: Family Medicine

## 2012-03-31 ENCOUNTER — Ambulatory Visit: Payer: Self-pay | Admitting: Family Medicine

## 2012-04-01 ENCOUNTER — Encounter: Payer: Self-pay | Admitting: *Deleted

## 2012-04-09 ENCOUNTER — Ambulatory Visit (INDEPENDENT_AMBULATORY_CARE_PROVIDER_SITE_OTHER): Payer: 59 | Admitting: Family Medicine

## 2012-04-09 DIAGNOSIS — Z2911 Encounter for prophylactic immunotherapy for respiratory syncytial virus (RSV): Secondary | ICD-10-CM

## 2012-04-09 DIAGNOSIS — Z23 Encounter for immunization: Secondary | ICD-10-CM

## 2012-07-08 ENCOUNTER — Other Ambulatory Visit: Payer: Self-pay | Admitting: Family Medicine

## 2012-08-30 ENCOUNTER — Telehealth: Payer: Self-pay | Admitting: Family Medicine

## 2012-08-30 DIAGNOSIS — I10 Essential (primary) hypertension: Secondary | ICD-10-CM

## 2012-08-30 DIAGNOSIS — E119 Type 2 diabetes mellitus without complications: Secondary | ICD-10-CM

## 2012-08-30 NOTE — Telephone Encounter (Signed)
Message copied by Judy Pimple on Sun Aug 30, 2012 12:01 PM ------      Message from: Alvina Chou      Created: Tue Aug 25, 2012 11:32 AM      Regarding: Lab orders for Monday, 4.28.14       Labs for 6 month f/u ------

## 2012-08-31 ENCOUNTER — Other Ambulatory Visit (INDEPENDENT_AMBULATORY_CARE_PROVIDER_SITE_OTHER): Payer: 59

## 2012-08-31 DIAGNOSIS — I10 Essential (primary) hypertension: Secondary | ICD-10-CM

## 2012-08-31 DIAGNOSIS — E119 Type 2 diabetes mellitus without complications: Secondary | ICD-10-CM

## 2012-08-31 LAB — CBC WITH DIFFERENTIAL/PLATELET
Basophils Relative: 0.6 % (ref 0.0–3.0)
Eosinophils Relative: 0.8 % (ref 0.0–5.0)
HCT: 38.2 % (ref 36.0–46.0)
Hemoglobin: 12.6 g/dL (ref 12.0–15.0)
Lymphs Abs: 3.4 10*3/uL (ref 0.7–4.0)
MCV: 88.3 fl (ref 78.0–100.0)
Monocytes Absolute: 0.4 10*3/uL (ref 0.1–1.0)
RBC: 4.33 Mil/uL (ref 3.87–5.11)
WBC: 6.4 10*3/uL (ref 4.5–10.5)

## 2012-08-31 LAB — COMPREHENSIVE METABOLIC PANEL
Albumin: 3.9 g/dL (ref 3.5–5.2)
Alkaline Phosphatase: 62 U/L (ref 39–117)
BUN: 18 mg/dL (ref 6–23)
GFR: 64.35 mL/min (ref >60.00)
Glucose, Bld: 113 mg/dL — ABNORMAL HIGH (ref 70–99)
Total Bilirubin: 0.7 mg/dL (ref 0.3–1.2)

## 2012-08-31 LAB — HEMOGLOBIN A1C: Hgb A1c MFr Bld: 7.7 % — ABNORMAL HIGH (ref 4.6–6.5)

## 2012-09-07 ENCOUNTER — Ambulatory Visit (INDEPENDENT_AMBULATORY_CARE_PROVIDER_SITE_OTHER): Payer: 59 | Admitting: Family Medicine

## 2012-09-07 ENCOUNTER — Encounter: Payer: Self-pay | Admitting: Family Medicine

## 2012-09-07 VITALS — BP 114/70 | HR 82 | Temp 98.7°F | Ht 61.0 in | Wt 209.8 lb

## 2012-09-07 DIAGNOSIS — E669 Obesity, unspecified: Secondary | ICD-10-CM

## 2012-09-07 DIAGNOSIS — E119 Type 2 diabetes mellitus without complications: Secondary | ICD-10-CM

## 2012-09-07 DIAGNOSIS — I10 Essential (primary) hypertension: Secondary | ICD-10-CM

## 2012-09-07 MED ORDER — LISINOPRIL-HYDROCHLOROTHIAZIDE 20-12.5 MG PO TABS
2.0000 | ORAL_TABLET | Freq: Every day | ORAL | Status: DC
Start: 2012-09-07 — End: 2013-09-14

## 2012-09-07 NOTE — Assessment & Plan Note (Signed)
Discussed how this problem influences overall health and the risks it imposes  Reviewed plan for weight loss with lower calorie diet (via better food choices and also portion control or program like weight watchers) and exercise building up to or more than 30 minutes 5 days per week including some aerobic activity    

## 2012-09-07 NOTE — Assessment & Plan Note (Signed)
Worse with poor habits  Will get back on track with exercise 5 d per week and DM diet  Wt loss stressed  Rev low glycemic diet  a1c is 7.7- and goal is below 7

## 2012-09-07 NOTE — Progress Notes (Signed)
Subjective:    Patient ID: [name], female    DOB: 1951/03/06, 62 y.o.   MRN: [id number]  HPI Here for f/u of DM2 and other problems   Had a dull winter -was more tired and less active (not active at all)- was sleeping all day   Wt is up 2 lb   bp is stable today  No cp or palpitations or headaches or edema  No side effects to medicines  BP Readings from Last 3 Encounters:  09/07/12 114/70  03/09/12 116/80  08/30/11 118/78     Diabetes Home sugar results am sugars are up 129 or higher/ later in the day they are very good  DM diet - not great -working on getting back to good habits (was eating potato chips) Exercise - elliptical (low impact) Symptoms A1C last  Lab Results  Component Value Date   HGBA1C 7.7* 08/31/2012   This is up from 6.9 No problems with medications  Renal protection- on ace  Last eye exam 11/13  Patient Active Problem List   Diagnosis Date Noted  . Cutaneous skin tags 03/09/2012  . Other screening mammogram 12/18/2010  . Routine general medical examination at a health care facility 12/08/2010  . Osteoarthritis of knee 09/13/2010  . Knee pain, bilateral 09/11/2010  . Obesity   . DIABETES-TYPE 2 02/26/2010  . HYPERTENSION 08/29/2009   Past Medical History  Diagnosis Date  . HTN (hypertension)   . Diabetes mellitus type II   . Obesity    Past Surgical History  Procedure Laterality Date  . Total abdominal hysterectomy  10/1999    fibroids  . Combined abdominoplasty and liposuction  07/2000   History  Substance Use Topics  . Smoking status: Never Smoker   . Smokeless tobacco: Not on file  . Alcohol Use: No   Family History  Problem Relation Age of Onset  . Arthritis Mother   . Hypertension Mother   . Cancer Mother     breast, lung  . Diabetes Mother    No Known Allergies Current Outpatient Prescriptions on File Prior to Visit  Medication Sig Dispense Refill  . fish oil-omega-3 fatty acids 1000 MG capsule Take 1 g by mouth  daily.        Marland Kitchen glucose blood test strip Use as instructed  100 each  6  . lisinopril-hydrochlorothiazide (PRINZIDE,ZESTORETIC) 20-12.5 MG per tablet TAKE TWO (2) TABLETS BY MOUTH DAILY  60 tablet  1  . metFORMIN (GLUCOPHAGE) 1000 MG tablet TAKE ONE TABLET BY MOUTH TWICE DAILY WITH MEALS  60 tablet  1  . Multiple Vitamin (MULTIVITAMIN) tablet Take 1 tablet by mouth daily.         No current facility-administered medications on file prior to visit.        Review of Systems Review of Systems  Constitutional: Negative for fever, appetite change, fatigue and unexpected weight change.  Eyes: Negative for pain and visual disturbance.  Respiratory: Negative for cough and shortness of breath.   Cardiovascular: Negative for cp or palpitations    Gastrointestinal: Negative for nausea, diarrhea and constipation.  Genitourinary: Negative for urgency and frequency.  Skin: Negative for pallor or rash   Neurological: Negative for weakness, light-headedness, numbness and headaches.  Hematological: Negative for adenopathy. Does not bruise/bleed easily.  Psychiatric/Behavioral: Negative for dysphoric mood. The patient is not nervous/anxious.         Objective:   Physical Exam  Constitutional: She appears well-developed and well-nourished. No distress.  obese  and well appearing   HENT:  Head: Normocephalic and atraumatic.  Mouth/Throat: Oropharynx is clear and moist.  Eyes: Conjunctivae and EOM are normal. Pupils are equal, round, and reactive to light. Right eye exhibits no discharge. Left eye exhibits no discharge. No scleral icterus.  Neck: Normal range of motion. Neck supple. No JVD present. Carotid bruit is not present. No thyromegaly present.  Cardiovascular: Normal rate, regular rhythm, normal heart sounds and intact distal pulses.  Exam reveals no gallop.   Pulmonary/Chest: Effort normal and breath sounds normal. No respiratory distress. She has no wheezes.  Abdominal: Soft. Bowel sounds  are normal. She exhibits no distension, no abdominal bruit and no mass. There is no tenderness.  Musculoskeletal: She exhibits no edema and no tenderness.  Lymphadenopathy:    She has no cervical adenopathy.  Neurological: She is alert. She has normal reflexes. No cranial nerve deficit. She exhibits normal muscle tone. Coordination normal.  Skin: Skin is warm and dry. No rash noted. No erythema. No pallor.  Psychiatric: She has a normal mood and affect.          Assessment & Plan:

## 2012-09-07 NOTE — Assessment & Plan Note (Signed)
bp in fair control at this time  No changes needed  Disc lifstyle change with low sodium diet and exercise   

## 2012-09-07 NOTE — Patient Instructions (Addendum)
Get back to good diabetic diet and exercise  Exercise at least 5 days per week and work on weight loss  Follow up in 3 month with labs prior

## 2012-10-07 ENCOUNTER — Other Ambulatory Visit: Payer: Self-pay | Admitting: Family Medicine

## 2012-11-12 ENCOUNTER — Other Ambulatory Visit: Payer: Self-pay

## 2012-12-01 ENCOUNTER — Other Ambulatory Visit (INDEPENDENT_AMBULATORY_CARE_PROVIDER_SITE_OTHER): Payer: 59

## 2012-12-01 DIAGNOSIS — E119 Type 2 diabetes mellitus without complications: Secondary | ICD-10-CM

## 2012-12-01 LAB — HEMOGLOBIN A1C: Hgb A1c MFr Bld: 7 % — ABNORMAL HIGH (ref 4.6–6.5)

## 2012-12-08 ENCOUNTER — Ambulatory Visit (INDEPENDENT_AMBULATORY_CARE_PROVIDER_SITE_OTHER): Payer: 59 | Admitting: Family Medicine

## 2012-12-08 ENCOUNTER — Encounter: Payer: Self-pay | Admitting: Family Medicine

## 2012-12-08 VITALS — BP 120/80 | HR 72 | Temp 98.8°F | Ht 61.0 in | Wt 206.5 lb

## 2012-12-08 DIAGNOSIS — E119 Type 2 diabetes mellitus without complications: Secondary | ICD-10-CM

## 2012-12-08 MED ORDER — METFORMIN HCL 1000 MG PO TABS: 1000.0000 mg | ORAL_TABLET | Freq: Two times a day (BID) | ORAL | Status: DC

## 2012-12-08 NOTE — Assessment & Plan Note (Signed)
A1C is better at 7.0 with lifestyle change and metformin Enc to keep up the good work and hopefully lose weight  F/u planned for 6 mo

## 2012-12-08 NOTE — Progress Notes (Signed)
Subjective:    Patient ID: [name], female    DOB: Jun 14, 1950, 62 y.o.   MRN: [id number]  HPI Here for f/u of DM Wt is up 3 lb  Doing better  Energy is a lot better  Walking each am -- with a buddy  Swimming 5 d per week (water aerobics)  Diabetes Home sugar results - better, in the am avg am 109 , and later in the day (PP) is 140 or less  DM diet - she is really working at it - and motivated - since last visit she has stopped eating wheat , and also cut carbs and sugar substantially (1500 cal per day)  Exercise - good !  Symptoms-none A1C last  Lab Results  Component Value Date   HGBA1C 7.0* 12/01/2012    No problems with medications - on metformin  Renal protection- is on ace  Last eye exam - was jan / feb     bp is stable today  No cp or palpitations or headaches or edema  No side effects to medicines  BP Readings from Last 3 Encounters:  12/08/12 130/90  09/07/12 114/70  03/09/12 116/80      Patient Active Problem List   Diagnosis Date Noted  . Other screening mammogram 12/18/2010  . Routine general medical examination at a health care facility 12/08/2010  . Osteoarthritis of knee 09/13/2010  . Knee pain, bilateral 09/11/2010  . Obesity   . DIABETES-TYPE 2 02/26/2010  . HYPERTENSION 08/29/2009   Past Medical History  Diagnosis Date  . HTN (hypertension)   . Diabetes mellitus type II   . Obesity    Past Surgical History  Procedure Laterality Date  . Total abdominal hysterectomy  10/1999    fibroids  . Combined abdominoplasty and liposuction  07/2000   History  Substance Use Topics  . Smoking status: Never Smoker   . Smokeless tobacco: Not on file  . Alcohol Use: No   Family History  Problem Relation Age of Onset  . Arthritis Mother   . Hypertension Mother   . Cancer Mother     breast, lung  . Diabetes Mother    No Known Allergies Current Outpatient Prescriptions on File Prior to Visit  Medication Sig Dispense Refill  . fish  oil-omega-3 fatty acids 1000 MG capsule Take 1 g by mouth daily.        Marland Kitchen glucose blood test strip Use as instructed  100 each  6  . KRILL OIL PO Take 1 tablet by mouth daily.      Marland Kitchen lisinopril-hydrochlorothiazide (PRINZIDE,ZESTORETIC) 20-12.5 MG per tablet Take 2 tablets by mouth daily.  60 tablet  11  . Multiple Vitamin (MULTIVITAMIN) tablet Take 1 tablet by mouth daily.         No current facility-administered medications on file prior to visit.    Review of Systems Review of Systems  Constitutional: Negative for fever, appetite change, fatigue and unexpected weight change.  Eyes: Negative for pain and visual disturbance.  Respiratory: Negative for cough and shortness of breath.   Cardiovascular: Negative for cp or palpitations    Gastrointestinal: Negative for nausea, diarrhea and constipation.  Genitourinary: Negative for urgency and frequency.  Skin: Negative for pallor or rash   Neurological: Negative for weakness, light-headedness, numbness and headaches.  Hematological: Negative for adenopathy. Does not bruise/bleed easily.  Psychiatric/Behavioral: Negative for dysphoric mood. The patient is not nervous/anxious.         Objective:   Physical  Exam  Constitutional: She appears well-developed and well-nourished. No distress.  obese and well appearing   HENT:  Head: Normocephalic and atraumatic.  Mouth/Throat: Oropharynx is clear and moist.  Eyes: Conjunctivae and EOM are normal. Pupils are equal, round, and reactive to light. No scleral icterus.  Neck: Normal range of motion. Neck supple. No JVD present. Carotid bruit is not present. No thyromegaly present.  Cardiovascular: Normal rate and regular rhythm.   Pulmonary/Chest: Effort normal and breath sounds normal. No respiratory distress. She has no wheezes. She has no rales.  Abdominal: Soft. Bowel sounds are normal. She exhibits no distension and no abdominal bruit. There is no tenderness.  Musculoskeletal: She exhibits no  edema.  Lymphadenopathy:    She has no cervical adenopathy.  Neurological: She is alert. She has normal reflexes. No cranial nerve deficit. She exhibits normal muscle tone. Coordination normal.  Skin: Skin is warm and dry. No rash noted. No erythema. No pallor.  Psychiatric: She has a normal mood and affect.          Assessment & Plan:

## 2012-12-08 NOTE — Patient Instructions (Addendum)
I'm so glad you are doing better with lifestyle change Keep working on diet and exercise and weight loss Lab Results  Component Value Date   HGBA1C 7.0* 12/01/2012    This is better  Follow up for annual exam with labs prior in 6 months please

## 2013-03-10 ENCOUNTER — Ambulatory Visit (INDEPENDENT_AMBULATORY_CARE_PROVIDER_SITE_OTHER): Payer: 59

## 2013-03-10 DIAGNOSIS — Z23 Encounter for immunization: Secondary | ICD-10-CM

## 2013-06-06 ENCOUNTER — Telehealth: Payer: Self-pay | Admitting: Family Medicine

## 2013-06-06 DIAGNOSIS — Z0000 Encounter for general adult medical examination without abnormal findings: Secondary | ICD-10-CM

## 2013-06-06 DIAGNOSIS — E119 Type 2 diabetes mellitus without complications: Secondary | ICD-10-CM

## 2013-06-06 NOTE — Telephone Encounter (Signed)
Message copied by Abner Greenspan on Sun Jun 06, 2013  9:21 PM ------      Message from: Ellamae Sia      Created: Tue Jun 01, 2013 12:39 PM      Regarding: Lab orders for Monday, 2.2.15       Patient is scheduled for CPX labs, please order future labs, Thanks , Terri       ------

## 2013-06-07 ENCOUNTER — Other Ambulatory Visit (INDEPENDENT_AMBULATORY_CARE_PROVIDER_SITE_OTHER): Payer: 59

## 2013-06-07 DIAGNOSIS — Z0000 Encounter for general adult medical examination without abnormal findings: Secondary | ICD-10-CM

## 2013-06-07 DIAGNOSIS — E119 Type 2 diabetes mellitus without complications: Secondary | ICD-10-CM

## 2013-06-07 LAB — CBC WITH DIFFERENTIAL/PLATELET
Basophils Absolute: 0 10*3/uL (ref 0.0–0.1)
Basophils Relative: 0.7 % (ref 0.0–3.0)
EOS ABS: 0 10*3/uL (ref 0.0–0.7)
Eosinophils Relative: 0.9 % (ref 0.0–5.0)
HCT: 38.8 % (ref 36.0–46.0)
Hemoglobin: 12.6 g/dL (ref 12.0–15.0)
Lymphocytes Relative: 55.7 % — ABNORMAL HIGH (ref 12.0–46.0)
Lymphs Abs: 3.1 10*3/uL (ref 0.7–4.0)
MCHC: 32.4 g/dL (ref 30.0–36.0)
MCV: 89.7 fl (ref 78.0–100.0)
Monocytes Absolute: 0.3 10*3/uL (ref 0.1–1.0)
Monocytes Relative: 5.5 % (ref 3.0–12.0)
NEUTROS ABS: 2.1 10*3/uL (ref 1.4–7.7)
Neutrophils Relative %: 37.2 % — ABNORMAL LOW (ref 43.0–77.0)
Platelets: 240 10*3/uL (ref 150.0–400.0)
RBC: 4.33 Mil/uL (ref 3.87–5.11)
RDW: 13.9 % (ref 11.5–14.6)
WBC: 5.6 10*3/uL (ref 4.5–10.5)

## 2013-06-07 LAB — COMPREHENSIVE METABOLIC PANEL
ALT: 11 U/L (ref 0–35)
AST: 16 U/L (ref 0–37)
Albumin: 3.7 g/dL (ref 3.5–5.2)
Alkaline Phosphatase: 62 U/L (ref 39–117)
BUN: 16 mg/dL (ref 6–23)
CO2: 29 mEq/L (ref 19–32)
Calcium: 9.5 mg/dL (ref 8.4–10.5)
Chloride: 104 mEq/L (ref 96–112)
Creatinine, Ser: 1.1 mg/dL (ref 0.4–1.2)
GFR: 66.28 mL/min (ref >60.00)
Glucose, Bld: 114 mg/dL — ABNORMAL HIGH (ref 70–99)
Potassium: 4 mEq/L (ref 3.5–5.1)
Sodium: 140 mEq/L (ref 135–145)
Total Bilirubin: 0.7 mg/dL (ref 0.3–1.2)
Total Protein: 6.5 g/dL (ref 6.0–8.3)

## 2013-06-07 LAB — LIPID PANEL
CHOL/HDL RATIO: 3
Cholesterol: 158 mg/dL (ref 0–200)
HDL: 54.3 mg/dL (ref >39.00)
LDL CALC: 88 mg/dL (ref 0–99)
TRIGLYCERIDES: 81 mg/dL (ref 0.0–149.0)
VLDL: 16.2 mg/dL (ref 0.0–40.0)

## 2013-06-07 LAB — HEMOGLOBIN A1C: Hgb A1c MFr Bld: 7.3 % — ABNORMAL HIGH (ref 4.6–6.5)

## 2013-06-07 LAB — TSH: TSH: 0.91 u[IU]/mL (ref 0.35–5.50)

## 2013-06-14 ENCOUNTER — Encounter: Payer: Self-pay | Admitting: Family Medicine

## 2013-06-14 ENCOUNTER — Ambulatory Visit (INDEPENDENT_AMBULATORY_CARE_PROVIDER_SITE_OTHER): Payer: 59 | Admitting: Family Medicine

## 2013-06-14 VITALS — BP 110/72 | HR 69 | Temp 98.3°F | Ht 61.0 in | Wt 206.2 lb

## 2013-06-14 DIAGNOSIS — L608 Other nail disorders: Secondary | ICD-10-CM | POA: Insufficient documentation

## 2013-06-14 DIAGNOSIS — Z0000 Encounter for general adult medical examination without abnormal findings: Secondary | ICD-10-CM

## 2013-06-14 DIAGNOSIS — L609 Nail disorder, unspecified: Secondary | ICD-10-CM

## 2013-06-14 DIAGNOSIS — I10 Essential (primary) hypertension: Secondary | ICD-10-CM

## 2013-06-14 DIAGNOSIS — E119 Type 2 diabetes mellitus without complications: Secondary | ICD-10-CM

## 2013-06-14 DIAGNOSIS — E669 Obesity, unspecified: Secondary | ICD-10-CM

## 2013-06-14 NOTE — Progress Notes (Signed)
Pre-visit discussion using our clinic review tool. No additional management support is needed unless otherwise documented below in the visit note.  

## 2013-06-14 NOTE — Patient Instructions (Signed)
Get back on metformin regularly  Keep working on diet and exercise Don't forget to schedule your mammogram , and also schedule your yearly eye exam  We will refer you to podiatry for your toenails

## 2013-06-14 NOTE — Assessment & Plan Note (Signed)
Thickened discolored R great and 3rd  Ref to podiatry for this and DM foot care

## 2013-06-14 NOTE — Assessment & Plan Note (Signed)
Reviewed health habits including diet and exercise and skin cancer prevention Reviewed appropriate screening tests for age  Also reviewed health mt list, fam hx and immunization status , as well as social and family history    Labs reviewed Pt will schedule her own mammogram

## 2013-06-14 NOTE — Assessment & Plan Note (Signed)
Worse  Lab Results  Component Value Date   HGBA1C 7.3* 06/07/2013   with noncompliance - lifestyle and med dosing Will get back on track -counseled extensively F/u 3 mo with lab prior She will schedule her own eye exam

## 2013-06-14 NOTE — Progress Notes (Signed)
Subjective:    Patient ID: [name], female    DOB: [birth year], 63 y.o.   MRN: [id number]  HPI Here for health maintenance exam and to review chronic medical problems    Mammogram 11/13 - did not get one this fall  Self exam -no lumps Has had a hysterectomy - no problems gyn at all   Flu vaccine 11/14   ptx 8/12 vaccine  Td 4/11  Colonoscopy 10/11 -- 10 year recall  Zoster vaccine 12/13   bp is stable today  No cp or palpitations or headaches or edema  No side effects to medicines  BP Readings from Last 3 Encounters:  06/14/13 110/72  12/08/12 120/80  09/07/12 114/70     Wt is the same Obese - her motivation for wt loss waxes and wanes   Just started back exercising - - got off track with holidays  In the winter - has an elliptical at home -starting to use it - also walks at the mall   Diabetes Home sugar results - am sugar is the problem- after meals control is good  Does not sleep well  DM diet - off track during the holidays , now is back to eating a DM diet  Exercise - getting better  Symptoms-none  A1C last  Lab Results  Component Value Date   HGBA1C 7.3* 06/07/2013   It was 7 last time  No problems with medications - she will not add another medicine -thinks she can do better - was also forgetting to take the 2nd dose  Renal protection ace  Last eye exam 2/14 - due this mo   Lipids Lab Results  Component Value Date   CHOL 158 06/07/2013   CHOL 151 02/26/2012   CHOL 161 12/13/2010   Lab Results  Component Value Date   HDL 54.30 06/07/2013   HDL 46.20 02/26/2012   HDL 65.90 12/13/2010   Lab Results  Component Value Date   LDLCALC 88 06/07/2013   LDLCALC 87 02/26/2012   LDLCALC 79 12/13/2010   Lab Results  Component Value Date   TRIG 81.0 06/07/2013   TRIG 88.0 02/26/2012   TRIG 80.0 12/13/2010   Lab Results  Component Value Date   CHOLHDL 3 06/07/2013   CHOLHDL 3 02/26/2012   CHOLHDL 2 12/13/2010   No results found for this basename: LDLDIRECT   very good profile   She has recurrent fungal toenail  ? She is not sure - R great and 3rd toenail  Would  Like a pod referral    Patient Active Problem List   Diagnosis Date Noted  . Deformity of toenail 06/14/2013  . Other screening mammogram 12/18/2010  . Routine general medical examination at a health care facility 12/08/2010  . Osteoarthritis of knee 09/13/2010  . Knee pain, bilateral 09/11/2010  . Obesity   . DIABETES-TYPE 2 02/26/2010  . HYPERTENSION 08/29/2009   Past Medical History  Diagnosis Date  . HTN (hypertension)   . Diabetes mellitus type II   . Obesity    Past Surgical History  Procedure Laterality Date  . Total abdominal hysterectomy  10/1999    fibroids  . Combined abdominoplasty and liposuction  07/2000   History  Substance Use Topics  . Smoking status: Never Smoker   . Smokeless tobacco: Not on file  . Alcohol Use: No   Family History  Problem Relation Age of Onset  . Arthritis Mother   . Hypertension Mother   . Cancer  Mother     breast, lung  . Diabetes Mother    No Known Allergies Current Outpatient Prescriptions on File Prior to Visit  Medication Sig Dispense Refill  . fish oil-omega-3 fatty acids 1000 MG capsule Take 1 g by mouth daily.        Marland Kitchen glucose blood test strip Use as instructed  100 each  6  . KRILL OIL PO Take 1 tablet by mouth daily.      Marland Kitchen lisinopril-hydrochlorothiazide (PRINZIDE,ZESTORETIC) 20-12.5 MG per tablet Take 2 tablets by mouth daily.  60 tablet  11  . metFORMIN (GLUCOPHAGE) 1000 MG tablet Take 1 tablet (1,000 mg total) by mouth 2 (two) times daily with a meal.  60 tablet  11  . Multiple Vitamin (MULTIVITAMIN) tablet Take 1 tablet by mouth daily.         No current facility-administered medications on file prior to visit.    Review of Systems    Review of Systems  Constitutional: Negative for fever, appetite change, fatigue and unexpected weight change.  Eyes: Negative for pain and visual disturbance.    Respiratory: Negative for cough and shortness of breath.   Cardiovascular: Negative for cp or palpitations    Gastrointestinal: Negative for nausea, diarrhea and constipation.  Genitourinary: Negative for urgency and frequency.  Skin: Negative for pallor or rash  pos for thickened toenails Neurological: Negative for weakness, light-headedness, numbness and headaches.  Hematological: Negative for adenopathy. Does not bruise/bleed easily.  Psychiatric/Behavioral: Negative for dysphoric mood. The patient is not nervous/anxious.      Objective:   Physical Exam  Constitutional: She appears well-developed and well-nourished. No distress.  obese and well appearing   HENT:  Head: Normocephalic and atraumatic.  Right Ear: External ear normal.  Left Ear: External ear normal.  Mouth/Throat: Oropharynx is clear and moist.  Eyes: Conjunctivae and EOM are normal. Pupils are equal, round, and reactive to light. No scleral icterus.  Neck: Normal range of motion. Neck supple. No JVD present. Carotid bruit is not present. No thyromegaly present.  Cardiovascular: Normal rate, regular rhythm, normal heart sounds and intact distal pulses.  Exam reveals no gallop.   Pulmonary/Chest: Effort normal and breath sounds normal. No respiratory distress. She has no wheezes. She exhibits no tenderness.  Abdominal: Soft. Bowel sounds are normal. She exhibits no distension, no abdominal bruit and no mass. There is no tenderness.  Genitourinary: No breast swelling, tenderness, discharge or bleeding.  Breast exam: No mass, nodules, thickening, tenderness, bulging, retraction, inflamation, nipple discharge or skin changes noted.  No axillary or clavicular LA.    Musculoskeletal: Normal range of motion. She exhibits no edema and no tenderness.  Lymphadenopathy:    She has no cervical adenopathy.  Neurological: She is alert. She has normal reflexes. No cranial nerve deficit. She exhibits normal muscle tone. Coordination  normal.  Skin: Skin is warm and dry. No rash noted. No erythema. No pallor.  lentigos noted and skin tags  Thickened yellowed R great and 3rd toenails  Psychiatric: She has a normal mood and affect.          Assessment & Plan:

## 2013-06-14 NOTE — Assessment & Plan Note (Signed)
BP: 110/72 mmHg  bp in fair control at this time  No changes needed Disc lifstyle change with low sodium diet and exercise   Labs reviewed

## 2013-06-14 NOTE — Assessment & Plan Note (Signed)
Discussed how this problem influences overall health and the risks it imposes  Reviewed plan for weight loss with lower calorie diet (via better food choices and also portion control or program like weight watchers) and exercise building up to or more than 30 minutes 5 days per week including some aerobic activity    

## 2013-06-15 ENCOUNTER — Telehealth: Payer: Self-pay | Admitting: Family Medicine

## 2013-06-15 NOTE — Telephone Encounter (Signed)
Relevant patient education assigned to patient using Emmi. ° °

## 2013-06-17 ENCOUNTER — Ambulatory Visit: Payer: Self-pay | Admitting: Family Medicine

## 2013-06-18 ENCOUNTER — Telehealth: Payer: Self-pay

## 2013-06-18 ENCOUNTER — Encounter: Payer: Self-pay | Admitting: Family Medicine

## 2013-06-18 NOTE — Telephone Encounter (Signed)
Relevant patient education assigned to patient using Emmi. ° °

## 2013-06-23 ENCOUNTER — Encounter: Payer: Self-pay | Admitting: *Deleted

## 2013-07-09 ENCOUNTER — Encounter: Payer: Self-pay | Admitting: Podiatrist

## 2013-07-09 ENCOUNTER — Ambulatory Visit (INDEPENDENT_AMBULATORY_CARE_PROVIDER_SITE_OTHER): Payer: 59 | Admitting: Podiatrist

## 2013-07-09 VITALS — BP 113/73 | HR 66 | Resp 18

## 2013-07-09 DIAGNOSIS — L603 Nail dystrophy: Secondary | ICD-10-CM

## 2013-07-09 DIAGNOSIS — L608 Other nail disorders: Secondary | ICD-10-CM

## 2013-07-09 MED ORDER — CICLOPIROX 8 % EX SOLN: Freq: Every day | CUTANEOUS | Status: DC

## 2013-07-09 NOTE — Progress Notes (Signed)
   Subjective:    Patient ID: [name], female    DOB: 1951-02-07, 63 y.o.   MRN: [id number]  HPI  I am a diabetic and have been for about 3 years and no swelling and no coldness and no burning or throbbing and no numbness or tingling and thick and discolored and I do trim on them and file them down with a file and it is on all nails    Review of Systems  Musculoskeletal:       Joint pain in both knees  All other systems reviewed and are negative.       Objective:   Physical Exam GENERAL APPEARANCE: Alert, conversant. Appropriately groomed. No acute distress.  VASCULAR: Pedal pulses palpable at 2/4 DP and 1/4 PT bilateral.  Capillary refill time is immediate to all digits,  Proximal to distal cooling it warm to warm.  Digital hair growth is present bilateral  NEUROLOGIC: sensation is intact epicritically and protectively to 5.07 monofilament at 5/5 sites bilateral.  Light touch is intact bilateral, vibratory sensation intact bilateral, achilles tendon reflex is intact bilateral.  MUSCULOSKELETAL: acceptable muscle strength, tone and stability bilateral.  Intrinsic muscluature intact bilateral.  No deformities are present DERMATOLOGIC: skin color, texture, and turger are within normal limits.  No preulcerative lesions are seen, no interdigital maceration noted.  No open lesions present.  Digital nails are darkish is discoloration 1,4 right and 1 left.  They appear to be a normal darkening appearance and no appearance of mycotic changes are present    Assessment & Plan:  Diabetes without complication, darkening of digital nails bilateral  Plan:  Patient has no class findings and no symptoms of diabetic neuropathy bilateral.  Give instructions for proper foot care in diabetes.  She will be seen back yearly or as needed.  If any problems arise, she will call.

## 2013-07-09 NOTE — Patient Instructions (Signed)
Diabetes and Foot Care Diabetes may cause you to have problems because of poor blood supply (circulation) to your feet and legs. This may cause the skin on your feet to become thinner, break easier, and heal more slowly. Your skin may become dry, and the skin may peel and crack. You may also have nerve damage in your legs and feet causing decreased feeling in them. You may not notice minor injuries to your feet that could lead to infections or more serious problems. Taking care of your feet is one of the most important things you can do for yourself.  HOME CARE INSTRUCTIONS  Wear shoes at all times, even in the house. Do not go barefoot. Bare feet are easily injured.  Check your feet daily for blisters, cuts, and redness. If you cannot see the bottom of your feet, use a mirror or ask someone for help.  Wash your feet with warm water (do not use hot water) and mild soap. Then pat your feet and the areas between your toes until they are completely dry. Do not soak your feet as this can dry your skin.  Apply a moisturizing lotion or petroleum jelly (that does not contain alcohol and is unscented) to the skin on your feet and to dry, brittle toenails. Do not apply lotion between your toes.  Trim your toenails straight across. Do not dig under them or around the cuticle. File the edges of your nails with an emery board or nail file.  Do not cut corns or calluses or try to remove them with medicine.  Wear clean socks or stockings every day. Make sure they are not too tight. Do not wear knee-high stockings since they may decrease blood flow to your legs.  Wear shoes that fit properly and have enough cushioning. To break in new shoes, wear them for just a few hours a day. This prevents you from injuring your feet. Always look in your shoes before you put them on to be sure there are no objects inside.  Do not cross your legs. This may decrease the blood flow to your feet.  If you find a minor scrape,  cut, or break in the skin on your feet, keep it and the skin around it clean and dry. These areas may be cleansed with mild soap and water. Do not cleanse the area with peroxide, alcohol, or iodine.  When you remove an adhesive bandage, be sure not to damage the skin around it.  If you have a wound, look at it several times a day to make sure it is healing.  Do not use heating pads or hot water bottles. They may burn your skin. If you have lost feeling in your feet or legs, you may not know it is happening until it is too late.  Make sure your health care provider performs a complete foot exam at least annually or more often if you have foot problems. Report any cuts, sores, or bruises to your health care provider immediately. SEEK MEDICAL CARE IF:   You have an injury that is not healing.  You have cuts or breaks in the skin.  You have an ingrown nail.  You notice redness on your legs or feet.  You feel burning or tingling in your legs or feet.  You have pain or cramps in your legs and feet.  Your legs or feet are numb.  Your feet always feel cold. SEEK IMMEDIATE MEDICAL CARE IF:   There is increasing redness,   swelling, or pain in or around a wound.  There is a red line that goes up your leg.  Pus is coming from a wound.  You develop a fever or as directed by your health care provider.  You notice a bad smell coming from an ulcer or wound. Document Released: 04/19/2000 Document Revised: 12/23/2012 Document Reviewed: 09/29/2012 [employer] Patient Information 2014 [employer]  

## 2013-07-09 NOTE — Progress Notes (Deleted)
No nail trim-- do the diabetic foot eval template.  Pulses intact 2/4 dp and 1/4 pt.   Sensation itact.  Darkening to toenails 1,4 ritght .Marland Kitchen Al ittle 1 left.  Everything is ok

## 2013-09-06 ENCOUNTER — Other Ambulatory Visit (INDEPENDENT_AMBULATORY_CARE_PROVIDER_SITE_OTHER): Payer: 59

## 2013-09-06 DIAGNOSIS — E119 Type 2 diabetes mellitus without complications: Secondary | ICD-10-CM

## 2013-09-06 LAB — HEMOGLOBIN A1C: Hgb A1c MFr Bld: 6.4 % (ref 4.6–6.5)

## 2013-09-13 ENCOUNTER — Ambulatory Visit (INDEPENDENT_AMBULATORY_CARE_PROVIDER_SITE_OTHER)
Admission: RE | Admit: 2013-09-13 | Discharge: 2013-09-13 | Disposition: A | Discharge status: Home / Self Care | Payer: 59 | Source: Ambulatory Visit | Attending: Family Medicine | Admitting: Family Medicine

## 2013-09-13 ENCOUNTER — Encounter: Payer: Self-pay | Admitting: Family Medicine

## 2013-09-13 ENCOUNTER — Ambulatory Visit (INDEPENDENT_AMBULATORY_CARE_PROVIDER_SITE_OTHER): Payer: 59 | Admitting: Family Medicine

## 2013-09-13 VITALS — BP 116/82 | HR 66 | Temp 97.8°F | Ht 61.0 in | Wt 203.0 lb

## 2013-09-13 DIAGNOSIS — Z01810 Encounter for preprocedural cardiovascular examination: Secondary | ICD-10-CM

## 2013-09-13 DIAGNOSIS — E119 Type 2 diabetes mellitus without complications: Secondary | ICD-10-CM

## 2013-09-13 DIAGNOSIS — I10 Essential (primary) hypertension: Secondary | ICD-10-CM

## 2013-09-13 DIAGNOSIS — Z01818 Encounter for other preprocedural examination: Secondary | ICD-10-CM

## 2013-09-13 NOTE — Progress Notes (Signed)
Subjective:    Patient ID: [name], female    DOB: 01/23/1951, 63 y.o.   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 - is ready to get that done  Walking on it despite the pain and she does not like medication  Is ready for the rehab- will go to [facility]    EKG today is NSR with rate of 63 BP Readings from Last 3 Encounters:  09/13/13 116/82  07/09/13 113/73  06/14/13 110/72    Never had anesthesia problems  Got nauseated once - mild  No drug allergies  No asthma or copd or heart problems  Has DM -they will watch  She will hold her fish oil     Chemistry      Component Value Date/Time   NA 140 06/07/2013 0852   K 4.0 06/07/2013 0852   CL 104 06/07/2013 0852   CO2 29 06/07/2013 0852   BUN 16 06/07/2013 0852   CREATININE 1.1 06/07/2013 0852      Component Value Date/Time   CALCIUM 9.5 06/07/2013 0852   ALKPHOS 62 06/07/2013 0852   AST 16 06/07/2013 0852   ALT 11 06/07/2013 0852   BILITOT 0.7 06/07/2013 0852       Lab Results  Component Value Date   HGBA1C 6.4 09/06/2013   she has been exercising and eating a lot less carbs and sugar  She is delighted  Down 7 lb from a year ago  Walks 3 miles in an hour 3 d per week and then also elliptical  Very happy about that   This is improved! Had opthy exam in feb   Patient Active Problem List   Diagnosis Date Noted  . Pre-op exam 09/13/2013  . Deformity of toenail 06/14/2013  . Other screening mammogram 12/18/2010  . Routine general medical examination at a health care facility 12/08/2010  . Osteoarthritis of knee 09/13/2010  . Knee pain, bilateral 09/11/2010  . Obesity   . DIABETES-TYPE 2 02/26/2010  . HYPERTENSION 08/29/2009   Past Medical History  Diagnosis Date  . HTN (hypertension)   . Diabetes mellitus type II   . Obesity    Past Surgical History  Procedure Laterality Date  . Total abdominal hysterectomy  10/1999    fibroids  . Combined abdominoplasty and liposuction  07/2000    History  Substance Use Topics  . Smoking status: Never Smoker   . Smokeless tobacco: Never Used  . Alcohol Use: No   Family History  Problem Relation Age of Onset  . Arthritis Mother   . Hypertension Mother   . Cancer Mother     breast, lung  . Diabetes Mother    No Known Allergies Current Outpatient Prescriptions on File Prior to Visit  Medication Sig Dispense Refill  . fish oil-omega-3 fatty acids 1000 MG capsule Take 1 g by mouth daily.        Marland Kitchen glucose blood test strip Use as instructed  100 each  6  . lisinopril-hydrochlorothiazide (PRINZIDE,ZESTORETIC) 20-12.5 MG per tablet Take 2 tablets by mouth daily.  60 tablet  11  . metFORMIN (GLUCOPHAGE) 1000 MG tablet Take 1 tablet (1,000 mg total) by mouth 2 (two) times daily with a meal.  60 tablet  11  . Multiple Vitamin (MULTIVITAMIN) tablet Take 1 tablet by mouth daily.         No current facility-administered medications on file prior to visit.    Review  of Systems Review of Systems  Constitutional: Negative for fever, appetite change, fatigue and unexpected weight change.  Eyes: Negative for pain and visual disturbance.  Respiratory: Negative for cough and shortness of breath.   Cardiovascular: Negative for cp or palpitations    Gastrointestinal: Negative for nausea, diarrhea and constipation.  Genitourinary: Negative for urgency and frequency.  Skin: Negative for pallor or rash   MSK pos for R knee pain that is constant  Neurological: Negative for weakness, light-headedness, numbness and headaches.  Hematological: Negative for adenopathy. Does not bruise/bleed easily.  Psychiatric/Behavioral: Negative for dysphoric mood. The patient is not nervous/anxious.         Objective:   Physical Exam  Constitutional: She appears well-developed and well-nourished. No distress.  obese and well appearing   HENT:  Head: Normocephalic and atraumatic.  Eyes: Conjunctivae and EOM are normal. Pupils are equal, round, and  reactive to light.  Neck: Normal range of motion. Neck supple. Carotid bruit is not present.  Cardiovascular: Normal rate, regular rhythm, normal heart sounds and intact distal pulses.  Exam reveals no gallop.   No murmur heard. Pulmonary/Chest: Effort normal and breath sounds normal. No respiratory distress. She has no wheezes. She has no rales.  No crackles   Abdominal: Soft. Bowel sounds are normal. She exhibits no distension, no abdominal bruit and no mass. There is no tenderness.  Musculoskeletal: She exhibits no edema.  Limited rom R knee  Lymphadenopathy:    She has no cervical adenopathy.  Neurological: She is alert. She has normal reflexes. No cranial nerve deficit. She exhibits normal muscle tone. Coordination normal.  Skin: Skin is warm and dry. No rash noted. No pallor.  Psychiatric: She has a normal mood and affect.          Assessment & Plan:

## 2013-09-13 NOTE — Progress Notes (Signed)
Pre visit review using our clinic review tool, if applicable. No additional management support is needed unless otherwise documented below in the visit note. 

## 2013-09-13 NOTE — Assessment & Plan Note (Signed)
No restrictions for upcoming surgery -medically cleared  EKG NSR rate in 60s  cxr also WNL  No med allergies Aware to stop fish oil before surgery  DM in good control

## 2013-09-13 NOTE — Assessment & Plan Note (Signed)
bp in fair control at this time  BP Readings from Last 1 Encounters:  09/13/13 116/82   No changes needed Disc lifstyle change with low sodium diet and exercise

## 2013-09-13 NOTE — Assessment & Plan Note (Signed)
Improved with a good diet/exercise effort and wt loss  Commended  Lab Results  Component Value Date   HGBA1C 6.4 09/06/2013

## 2013-09-13 NOTE — Patient Instructions (Addendum)
Keep up the good work with diet and exercise (physical therapy after your surgery ) No restrictions for upcoming surgery Your diabetes is much better controlled  EKG looks good  Chest xray today Follow up with Dr Aurea Graff office as planned  Take care of yourself  Follow up in 6 months with labs prior

## 2013-09-14 ENCOUNTER — Encounter (HOSPITAL_COMMUNITY): Payer: Self-pay | Admitting: Pharmacy Technician

## 2013-09-17 ENCOUNTER — Other Ambulatory Visit (HOSPITAL_COMMUNITY): Payer: Self-pay | Admitting: Orthopedic Surgery

## 2013-09-17 NOTE — Patient Instructions (Addendum)
Your procedure is scheduled on:  09/28/13  TUESDAY  Report to [facility] at   [place]    AM.  Call this number if you have problems the morning of surgery: [phone]        Do not eat food  Or drink :After Midnight. Monday NIGHT   Take these medicines the morning of surgery with A SIP OF WATER:NONE DO NOT TAKE ANY DIABETES MEDICATION MORNING OF SURGERY   .  Contacts, dentures or partial plates, or metal hairpins  can not be worn to surgery. Your family will be responsible for glasses, dentures, hearing aides while you are in surgery  Leave suitcase in the car. After surgery it may be brought to your room.  For patients admitted to the hospital, checkout time is 11:00 AM day of  discharge.                DO NOT WEAR JEWELRY, LOTIONS, POWDERS, OR PERFUMES.  WOMEN-- DO NOT SHAVE LEGS OR UNDERARMS FOR 48 HOURS BEFORE SHOWERS. MEN MAY SHAVE FACE.  Patients discharged the day of surgery will not be allowed to drive home. IF going home the day of surgery, you must have a driver and someone to stay with you for the first 24 hours                                                                                                                                          [facility] - Preparing for Surgery Before surgery, you can play an important role.  Because skin is not sterile, your skin needs to be as free of germs as possible.  You can reduce the number of germs on your skin by washing with CHG (chlorahexidine gluconate) soap before surgery.  CHG is an antiseptic cleaner which kills germs and bonds with the skin to continue killing germs even after washing. Please DO NOT use if you have an allergy to CHG or antibacterial soaps.  If your skin becomes reddened/irritated stop using the CHG and inform your nurse when you arrive at Short Stay. Do not shave (including legs and underarms) for at least 48 hours prior to the first CHG shower.  You may shave your face. Please follow  these instructions carefully:  1.  Shower with CHG Soap the night before surgery and the  morning of Surgery.  2.  If you choose to wash your hair, wash your hair first as usual with your  normal  shampoo.  3.  After you shampoo, rinse your hair and body thoroughly to remove the  shampoo.                           4.  Use CHG as you would any other liquid soap.  You can apply chg directly  to the  skin and wash                       Gently with a scrungie or clean washcloth.  5.  Apply the CHG Soap to your body ONLY FROM THE NECK DOWN.   Do not use on open                           Wound or open sores. Avoid contact with eyes, ears mouth and genitals (private parts).                        Genitals (private parts) with your normal soap.             6.  Wash thoroughly, paying special attention to the area where your surgery  will be performed.  7.  Thoroughly rinse your body with warm water from the neck down.  8.  DO NOT shower/wash with your normal soap after using and rinsing off  the CHG Soap.                9.  Pat yourself dry with a clean towel.            10.  Wear clean pajamas.            11.  Place clean sheets on your bed the night of your first shower and do not  sleep with pets. Day of Surgery : Do not apply any lotions/deodorants the morning of surgery.  Please wear clean clothes to the hospital/surgery center.  FAILURE TO FOLLOW THESE INSTRUCTIONS MAY RESULT IN THE CANCELLATION OF YOUR SURGERY PATIENT SIGNATURE_________________________________  NURSE SIGNATURE__________________________________  ________________________________________________________________________  WHAT IS A BLOOD TRANSFUSION? Blood Transfusion Information  A transfusion is the replacement of blood or some of its parts. Blood is made up of multiple cells which provide different functions.  Red blood cells carry oxygen and are used for blood loss replacement.  White blood cells fight against  infection.  Platelets control bleeding.  Plasma helps clot blood.  Other blood products are available for specialized needs, such as hemophilia or other clotting disorders. BEFORE THE TRANSFUSION  Who gives blood for transfusions?   Healthy volunteers who are fully evaluated to make sure their blood is safe. This is blood bank blood. Transfusion therapy is the safest it has ever been in the practice of medicine. Before blood is taken from a donor, a complete history is taken to make sure that person has no history of diseases nor engages in risky social behavior (examples are intravenous drug use or sexual activity with multiple partners). The donor's travel history is screened to minimize risk of transmitting infections, such as malaria. The donated blood is tested for signs of infectious diseases, such as HIV and hepatitis. The blood is then tested to be sure it is compatible with you in order to minimize the chance of a transfusion reaction. If you or a relative donates blood, this is often done in anticipation of surgery and is not appropriate for emergency situations. It takes many days to process the donated blood. RISKS AND COMPLICATIONS Although transfusion therapy is very safe and saves many lives, the main dangers of transfusion include:   Getting an infectious disease.  Developing a transfusion reaction. This is an allergic reaction to something in the blood you were given. Every precaution is taken to prevent this. The decision to have  a blood transfusion has been considered carefully by your caregiver before blood is given. Blood is not given unless the benefits outweigh the risks. AFTER THE TRANSFUSION  Right after receiving a blood transfusion, you will usually feel much better and more energetic. This is especially true if your red blood cells have gotten low (anemic). The transfusion raises the level of the red blood cells which carry oxygen, and this usually causes an energy  increase.  The nurse administering the transfusion will monitor you carefully for complications. HOME CARE INSTRUCTIONS  No special instructions are needed after a transfusion. You may find your energy is better. Speak with your caregiver about any limitations on activity for underlying diseases you may have. SEEK MEDICAL CARE IF:   Your condition is not improving after your transfusion.  You develop redness or irritation at the intravenous (IV) site. SEEK IMMEDIATE MEDICAL CARE IF:  Any of the following symptoms occur over the next 12 hours:  Shaking chills.  You have a temperature by mouth above 102 F (38.9 C), not controlled by medicine.  Chest, back, or muscle pain.  People around you feel you are not acting correctly or are confused.  Shortness of breath or difficulty breathing.  Dizziness and fainting.  You get a rash or develop hives.  You have a decrease in urine output.  Your urine turns a dark color or changes to pink, red, or brown. Any of the following symptoms occur over the next 10 days:  You have a temperature by mouth above 102 F (38.9 C), not controlled by medicine.  Shortness of breath.  Weakness after normal activity.  The white part of the eye turns yellow (jaundice).  You have a decrease in the amount of urine or are urinating less often.  Your urine turns a dark color or changes to pink, red, or brown. Document Released: 04/19/2000 Document Revised: 07/15/2011 Document Reviewed: 12/07/2007 Exit[employer] Patient Information 2014 [place].  _______________________________________________________________________  Incentive Spirometer  An incentive spirometer is a tool that can help keep your lungs clear and active. This tool measures how well you are filling your lungs with each breath. Taking long deep breaths may help reverse or decrease the chance of developing breathing (pulmonary) problems (especially infection) following:  A long  period of time when you are unable to move or be active. BEFORE THE PROCEDURE   If the spirometer includes an indicator to show your best effort, your nurse or respiratory therapist will set it to a desired goal.  If possible, sit up straight or lean slightly forward. Try not to slouch.  Hold the incentive spirometer in an upright position. INSTRUCTIONS FOR USE  1. Sit on the edge of your bed if possible, or sit up as far as you can in bed or on a chair. 2. Hold the incentive spirometer in an upright position. 3. Breathe out normally. 4. Place the mouthpiece in your mouth and seal your lips tightly around it. 5. Breathe in slowly and as deeply as possible, raising the piston or the ball toward the top of the column. 6. Hold your breath for 3-5 seconds or for as long as possible. Allow the piston or ball to fall to the bottom of the column. 7. Remove the mouthpiece from your mouth and breathe out normally. 8. Rest for a few seconds and repeat Steps 1 through 7 at least 10 times every 1-2 hours when you are awake. Take your time and take a few normal breaths between  deep breaths. 9. The spirometer may include an indicator to show your best effort. Use the indicator as a goal to work toward during each repetition. 10. After each set of 10 deep breaths, practice coughing to be sure your lungs are clear. If you have an incision (the cut made at the time of surgery), support your incision when coughing by placing a pillow or rolled up towels firmly against it. Once you are able to get out of bed, walk around indoors and cough well. You may stop using the incentive spirometer when instructed by your caregiver.  RISKS AND COMPLICATIONS  Take your time so you do not get dizzy or light-headed.  If you are in pain, you may need to take or ask for pain medication before doing incentive spirometry. It is harder to take a deep breath if you are having pain. AFTER USE  Rest and breathe slowly and  easily.  It can be helpful to keep track of a log of your progress. Your caregiver can provide you with a simple table to help with this. If you are using the spirometer at home, follow these instructions: [place] IF:   You are having difficultly using the spirometer.  You have trouble using the spirometer as often as instructed.  Your pain medication is not giving enough relief while using the spirometer.  You develop fever of 100.5 F (38.1 C) or higher. SEEK IMMEDIATE MEDICAL CARE IF:   You cough up bloody sputum that had not been present before.  You develop fever of 102 F (38.9 C) or greater.  You develop worsening pain at or near the incision site. MAKE SURE YOU:   Understand these instructions.  Will watch your condition.  Will get help right away if you are not doing well or get worse. Document Released: 09/02/2006 Document Revised: 07/15/2011 Document Reviewed: 11/03/2006 [facility] Patient Information 2014 [place], [place].   ________________________________________________________________________

## 2013-09-17 NOTE — Progress Notes (Signed)
Clearance with OV 5/15  Dr Glori Bickers received from fax, placed on chart, ekg, chest 5/15 chart and epic

## 2013-09-20 ENCOUNTER — Encounter (HOSPITAL_COMMUNITY)
Admission: RE | Admit: 2013-09-20 | Discharge: 2013-09-20 | Disposition: A | Discharge status: Home / Self Care | Payer: BC Managed Care – PPO | Source: Ambulatory Visit | Attending: Orthopedic Surgery | Admitting: Orthopedic Surgery

## 2013-09-20 ENCOUNTER — Encounter (HOSPITAL_COMMUNITY): Payer: Self-pay

## 2013-09-20 DIAGNOSIS — Z01812 Encounter for preprocedural laboratory examination: Secondary | ICD-10-CM | POA: Diagnosis present

## 2013-09-20 HISTORY — DX: Unspecified osteoarthritis, unspecified site: M19.90

## 2013-09-20 LAB — BASIC METABOLIC PANEL
BUN: 24 mg/dL — ABNORMAL HIGH (ref 6–23)
CHLORIDE: 101 meq/L (ref 96–112)
CO2: 29 mEq/L (ref 19–32)
Calcium: 10.6 mg/dL — ABNORMAL HIGH (ref 8.4–10.5)
Creatinine, Ser: 1.09 mg/dL (ref 0.50–1.10)
GFR calc Af Amer: 61 mL/min — ABNORMAL LOW (ref >90)
GFR calc non Af Amer: 52 mL/min — ABNORMAL LOW (ref >90)
Glucose, Bld: 108 mg/dL — ABNORMAL HIGH (ref 70–99)
Potassium: 4.6 mEq/L (ref 3.7–5.3)
Sodium: 142 mEq/L (ref 137–147)

## 2013-09-20 LAB — URINALYSIS, ROUTINE W REFLEX MICROSCOPIC
BILIRUBIN URINE: NEGATIVE
GLUCOSE, UA: NEGATIVE mg/dL
Hgb urine dipstick: NEGATIVE
KETONES UR: NEGATIVE mg/dL
Leukocytes, UA: NEGATIVE
NITRITE: NEGATIVE
PH: 5.5 (ref 5.0–8.0)
Protein, ur: NEGATIVE mg/dL
Specific Gravity, Urine: 1.025 (ref 1.005–1.030)
Urobilinogen, UA: 0.2 mg/dL (ref 0.0–1.0)

## 2013-09-20 LAB — SURGICAL PCR SCREEN
MRSA, PCR: NEGATIVE
Staphylococcus aureus: NEGATIVE

## 2013-09-20 LAB — CBC
HEMATOCRIT: 39.3 % (ref 36.0–46.0)
Hemoglobin: 13.2 g/dL (ref 12.0–15.0)
MCH: 29.1 pg (ref 26.0–34.0)
MCHC: 33.6 g/dL (ref 30.0–36.0)
MCV: 86.6 fL (ref 78.0–100.0)
Platelets: 260 10*3/uL (ref 150–400)
RBC: 4.54 MIL/uL (ref 3.87–5.11)
RDW: 13.9 % (ref 11.5–15.5)
WBC: 5.5 10*3/uL (ref 4.0–10.5)

## 2013-09-20 LAB — APTT: aPTT: 34 seconds (ref 24–37)

## 2013-09-20 LAB — ABO/RH: ABO/RH(D): A POS

## 2013-09-20 LAB — PROTIME-INR
INR: 0.95 (ref 0.00–1.49)
Prothrombin Time: 12.5 seconds (ref 11.6–15.2)

## 2013-09-20 NOTE — Progress Notes (Signed)
Faxed bmet to dr Alvan Dame via epic

## 2013-09-27 NOTE — H&P (Signed)
TOTAL KNEE ADMISSION H&P  Patient is being admitted for right total knee arthroplasty.  Subjective:  Chief Complaint:     Right knee OA / pain.  HPI: [name], 63 y.o. female, has a history of pain and functional disability in the right knee due to arthritis and has failed non-surgical conservative treatments for greater than 12 weeks to include NSAID's and/or analgesics and activity modification.  Onset of symptoms was gradual, starting years ago with gradually worsening course since that time. The patient noted no past surgery on the right knee(s).  Patient currently rates pain in the right knee(s) at 8 out of 10 with activity. Patient has night pain, worsening of pain with activity and weight bearing, pain that interferes with activities of daily living, pain with passive range of motion, crepitus and joint swelling.  Patient has evidence of periarticular osteophytes and joint space narrowing by imaging studies. There is no active infection.  Risks, benefits and expectations were discussed with the patient.  Risks including but not limited to the risk of anesthesia, blood clots, nerve damage, blood vessel damage, failure of the prosthesis, infection and up to and including death.  Patient understand the risks, benefits and expectations and wishes to proceed with surgery.   PCP: [name], MD  D/C Plans:      SNF - [facility]  Post-op Meds:       No Rx given   Tranexamic Acid:      is to be given  Decadron:      is not to be given - DM  FYI:     ASA post-op  Norco post-op   Patient Active Problem List   Diagnosis Date Noted  . Pre-op exam 09/13/2013  . Deformity of toenail 06/14/2013  . Other screening mammogram 12/18/2010  . Routine general medical examination at a health care facility 12/08/2010  . Osteoarthritis of knee 09/13/2010  . Knee pain, bilateral 09/11/2010  . Obesity   . DIABETES-TYPE 2 02/26/2010  . HYPERTENSION 08/29/2009   Past Medical History  Diagnosis  Date  . HTN (hypertension)   . Diabetes mellitus type II   . Obesity   . Arthritis     Past Surgical History  Procedure Laterality Date  . Total abdominal hysterectomy  10/1999    fibroids  . Combined abdominoplasty and liposuction  07/2000    No prescriptions prior to admission   No Known Allergies   History  Substance Use Topics  . Smoking status: Never Smoker   . Smokeless tobacco: Never Used  . Alcohol Use: No    Family History  Problem Relation Age of Onset  . Arthritis Mother   . Hypertension Mother   . Cancer Mother     breast, lung  . Diabetes Mother      Review of Systems  Constitutional: Negative.   HENT: Negative.   Eyes: Negative.   Respiratory: Negative.   Cardiovascular: Negative.   Gastrointestinal: Negative.   Genitourinary: Positive for frequency.  Musculoskeletal: Positive for joint pain.  Skin: Negative.   Neurological: Negative.   Endo/Heme/Allergies: Negative.   Psychiatric/Behavioral: Negative.     Objective:  Physical Exam  Constitutional: She is oriented to person, place, and time. She appears well-developed and well-nourished.  HENT:  Head: Normocephalic and atraumatic.  Mouth/Throat: Oropharynx is clear and moist. She has dentures.  Eyes: Pupils are equal, round, and reactive to light.  Neck: Neck supple. No JVD present. No tracheal deviation present. No thyromegaly present.  Cardiovascular: Normal rate, regular rhythm, normal heart sounds and intact distal pulses.   Respiratory: Effort normal and breath sounds normal. No stridor. No respiratory distress. She has no wheezes.  GI: Soft. There is no tenderness. There is no guarding.  Musculoskeletal:       Right knee: She exhibits decreased range of motion, swelling and bony tenderness. She exhibits no ecchymosis, no deformity, no laceration and no erythema. Tenderness found.  Lymphadenopathy:    She has no cervical adenopathy.  Neurological: She is alert and oriented to person,  place, and time.  Skin: Skin is warm and dry.  Psychiatric: She has a normal mood and affect.     Labs:  Estimated body mass index is 38.99 kg/(m^2) as calculated from the following:   Height as of 06/14/13: 5\' 1"  (1.549 m).   Weight as of 06/14/13: 93.554 kg (206 lb 4 oz).   Imaging Review Plain radiographs demonstrate severe degenerative joint disease of the right knee(s). The overall alignment is  neutral. The bone quality appears to be good for age and reported activity level.  Assessment/Plan:  End stage arthritis, right knee   The patient history, physical examination, clinical judgment of the provider and imaging studies are consistent with end stage degenerative joint disease of the right knee(s) and total knee arthroplasty is deemed medically necessary. The treatment options including medical management, injection therapy arthroscopy and arthroplasty were discussed at length. The risks and benefits of total knee arthroplasty were presented and reviewed. The risks due to aseptic loosening, infection, stiffness, patella tracking problems, thromboembolic complications and other imponderables were discussed. The patient acknowledged the explanation, agreed to proceed with the plan and consent was signed. Patient is being admitted for inpatient treatment for surgery, pain control, PT, OT, prophylactic antibiotics, VTE prophylaxis, progressive ambulation and ADL's and discharge planning. The patient is planning to be discharged to skilled nursing facility.     [place] Yonathan Perrow   PA-C  09/27/2013, 10:44 AM

## 2013-09-28 ENCOUNTER — Encounter (HOSPITAL_COMMUNITY): Payer: Self-pay | Admitting: *Deleted

## 2013-09-28 ENCOUNTER — Inpatient Hospital Stay (HOSPITAL_COMMUNITY)
Admission: RE | Admit: 2013-09-28 | Discharge: 2013-09-30 | DRG: 470 | Disposition: A | Discharge status: Skilled Nursing Facility | Payer: BC Managed Care – PPO | Source: Ambulatory Visit | Attending: Orthopedic Surgery | Admitting: Orthopedic Surgery

## 2013-09-28 ENCOUNTER — Encounter (HOSPITAL_COMMUNITY): Payer: BC Managed Care – PPO | Admitting: Anesthesiology

## 2013-09-28 ENCOUNTER — Inpatient Hospital Stay (HOSPITAL_COMMUNITY): Payer: BC Managed Care – PPO | Admitting: Anesthesiology

## 2013-09-28 ENCOUNTER — Encounter (HOSPITAL_COMMUNITY)
Admission: RE | Disposition: A | Discharge status: Skilled Nursing Facility | Payer: Self-pay | Source: Ambulatory Visit | Attending: Orthopedic Surgery

## 2013-09-28 DIAGNOSIS — M1710 Unilateral primary osteoarthritis, unspecified knee: Principal | ICD-10-CM | POA: Diagnosis present

## 2013-09-28 DIAGNOSIS — Z96659 Presence of unspecified artificial knee joint: Secondary | ICD-10-CM

## 2013-09-28 DIAGNOSIS — D500 Iron deficiency anemia secondary to blood loss (chronic): Secondary | ICD-10-CM | POA: Diagnosis not present

## 2013-09-28 DIAGNOSIS — Z833 Family history of diabetes mellitus: Secondary | ICD-10-CM

## 2013-09-28 DIAGNOSIS — D62 Acute posthemorrhagic anemia: Secondary | ICD-10-CM | POA: Diagnosis not present

## 2013-09-28 DIAGNOSIS — I10 Essential (primary) hypertension: Secondary | ICD-10-CM | POA: Diagnosis present

## 2013-09-28 DIAGNOSIS — Z6838 Body mass index (BMI) 38.0-38.9, adult: Secondary | ICD-10-CM

## 2013-09-28 DIAGNOSIS — M25469 Effusion, unspecified knee: Secondary | ICD-10-CM | POA: Diagnosis present

## 2013-09-28 DIAGNOSIS — M898X9 Other specified disorders of bone, unspecified site: Secondary | ICD-10-CM | POA: Diagnosis present

## 2013-09-28 DIAGNOSIS — Z8249 Family history of ischemic heart disease and other diseases of the circulatory system: Secondary | ICD-10-CM

## 2013-09-28 DIAGNOSIS — E669 Obesity, unspecified: Secondary | ICD-10-CM | POA: Diagnosis present

## 2013-09-28 DIAGNOSIS — M6580 Other synovitis and tenosynovitis, unspecified site: Secondary | ICD-10-CM | POA: Diagnosis present

## 2013-09-28 DIAGNOSIS — E119 Type 2 diabetes mellitus without complications: Secondary | ICD-10-CM | POA: Diagnosis present

## 2013-09-28 DIAGNOSIS — Z01812 Encounter for preprocedural laboratory examination: Secondary | ICD-10-CM

## 2013-09-28 HISTORY — PX: TOTAL KNEE ARTHROPLASTY: SHX125

## 2013-09-28 LAB — GLUCOSE, CAPILLARY
Glucose-Capillary: 93 mg/dL (ref 70–99)
Glucose-Capillary: 75 mg/dL (ref 70–99)
Glucose-Capillary: 67 mg/dL — ABNORMAL LOW (ref 70–99)
Glucose-Capillary: 73 mg/dL (ref 70–99)
Glucose-Capillary: 186 mg/dL — ABNORMAL HIGH (ref 70–99)

## 2013-09-28 LAB — TYPE AND SCREEN
ABO/RH(D): A POS
Antibody Screen: NEGATIVE

## 2013-09-28 SURGERY — ARTHROPLASTY, KNEE, TOTAL
Anesthesia: Spinal | Site: Knee | Laterality: Right

## 2013-09-28 MED ORDER — KETOROLAC TROMETHAMINE 30 MG/ML IJ SOLN
INTRAMUSCULAR | Status: AC
  Filled 2013-09-28: qty 1

## 2013-09-28 MED ORDER — DIPHENHYDRAMINE HCL 25 MG PO CAPS: 25.0000 mg | ORAL_CAPSULE | Freq: Four times a day (QID) | ORAL | Status: DC | PRN

## 2013-09-28 MED ORDER — PROPOFOL 10 MG/ML IV BOLUS
INTRAVENOUS | Status: AC
  Filled 2013-09-28: qty 20

## 2013-09-28 MED ORDER — MIDAZOLAM HCL 5 MG/5ML IJ SOLN
INTRAMUSCULAR | Status: DC | PRN
  Administered 2013-09-28: 2 mg via INTRAVENOUS

## 2013-09-28 MED ORDER — PROMETHAZINE HCL 25 MG/ML IJ SOLN: 6.2500 mg | INTRAMUSCULAR | Status: DC | PRN

## 2013-09-28 MED ORDER — CELECOXIB 200 MG PO CAPS
200.0000 mg | ORAL_CAPSULE | Freq: Two times a day (BID) | ORAL | Status: DC
  Administered 2013-09-28 – 2013-09-30 (×4): 200 mg via ORAL
  Filled 2013-09-28 (×5): qty 1

## 2013-09-28 MED ORDER — LACTATED RINGERS IV SOLN
INTRAVENOUS | Status: DC | PRN
  Administered 2013-09-28 (×3): via INTRAVENOUS

## 2013-09-28 MED ORDER — CEFAZOLIN SODIUM-DEXTROSE 2-3 GM-% IV SOLR
2.0000 g | Freq: Four times a day (QID) | INTRAVENOUS | Status: AC
  Administered 2013-09-28 (×2): 2 g via INTRAVENOUS
  Filled 2013-09-28 (×2): qty 50

## 2013-09-28 MED ORDER — 0.9 % SODIUM CHLORIDE (POUR BTL) OPTIME
TOPICAL | Status: DC | PRN
  Administered 2013-09-28: 1000 mL

## 2013-09-28 MED ORDER — FENTANYL CITRATE 0.05 MG/ML IJ SOLN
INTRAMUSCULAR | Status: AC
  Filled 2013-09-28: qty 2

## 2013-09-28 MED ORDER — OXYCODONE HCL 5 MG PO TABS: 5.0000 mg | ORAL_TABLET | Freq: Once | ORAL | Status: DC | PRN

## 2013-09-28 MED ORDER — BUPIVACAINE-EPINEPHRINE (PF) 0.25% -1:200000 IJ SOLN
INTRAMUSCULAR | Status: AC
  Filled 2013-09-28: qty 30

## 2013-09-28 MED ORDER — SODIUM CHLORIDE 0.9 % IJ SOLN
INTRAMUSCULAR | Status: AC
  Filled 2013-09-28: qty 10

## 2013-09-28 MED ORDER — PHENYLEPHRINE 40 MCG/ML (10ML) SYRINGE FOR IV PUSH (FOR BLOOD PRESSURE SUPPORT)
PREFILLED_SYRINGE | INTRAVENOUS | Status: AC
  Filled 2013-09-28: qty 10

## 2013-09-28 MED ORDER — SODIUM CHLORIDE 0.9 % IV SOLN
INTRAVENOUS | Status: DC
  Administered 2013-09-28 – 2013-09-29 (×2): via INTRAVENOUS
  Filled 2013-09-28 (×7): qty 1000

## 2013-09-28 MED ORDER — MIDAZOLAM HCL 2 MG/2ML IJ SOLN
INTRAMUSCULAR | Status: AC
  Filled 2013-09-28: qty 2

## 2013-09-28 MED ORDER — OXYCODONE HCL 5 MG/5ML PO SOLN
5.0000 mg | Freq: Once | ORAL | Status: DC | PRN
  Filled 2013-09-28: qty 5

## 2013-09-28 MED ORDER — FENTANYL CITRATE 0.05 MG/ML IJ SOLN
INTRAMUSCULAR | Status: DC | PRN
  Administered 2013-09-28: 50 ug via INTRAVENOUS

## 2013-09-28 MED ORDER — METFORMIN HCL 500 MG PO TABS
1000.0000 mg | ORAL_TABLET | Freq: Two times a day (BID) | ORAL | Status: DC
  Filled 2013-09-28 (×2): qty 2

## 2013-09-28 MED ORDER — METOCLOPRAMIDE HCL 10 MG PO TABS: 5.0000 mg | ORAL_TABLET | Freq: Three times a day (TID) | ORAL | Status: DC | PRN

## 2013-09-28 MED ORDER — MENTHOL 3 MG MT LOZG
1.0000 | LOZENGE | OROMUCOSAL | Status: DC | PRN
  Filled 2013-09-28: qty 9

## 2013-09-28 MED ORDER — KETOROLAC TROMETHAMINE 30 MG/ML IJ SOLN
INTRAMUSCULAR | Status: DC | PRN
  Administered 2013-09-28: 30 mg

## 2013-09-28 MED ORDER — CEFAZOLIN SODIUM-DEXTROSE 2-3 GM-% IV SOLR
INTRAVENOUS | Status: AC
  Filled 2013-09-28: qty 50

## 2013-09-28 MED ORDER — ONDANSETRON HCL 4 MG/2ML IJ SOLN
4.0000 mg | Freq: Four times a day (QID) | INTRAMUSCULAR | Status: DC | PRN
  Administered 2013-09-29: 4 mg via INTRAVENOUS
  Filled 2013-09-28: qty 2

## 2013-09-28 MED ORDER — FERROUS SULFATE 325 (65 FE) MG PO TABS
325.0000 mg | ORAL_TABLET | Freq: Three times a day (TID) | ORAL | Status: DC
  Administered 2013-09-29 – 2013-09-30 (×4): 325 mg via ORAL
  Filled 2013-09-28 (×8): qty 1

## 2013-09-28 MED ORDER — BISACODYL 10 MG RE SUPP: 10.0000 mg | Freq: Every day | RECTAL | Status: DC | PRN

## 2013-09-28 MED ORDER — CEFAZOLIN SODIUM-DEXTROSE 2-3 GM-% IV SOLR
2.0000 g | INTRAVENOUS | Status: AC
Start: 2013-09-28 — End: 2013-09-28
  Administered 2013-09-28: 2 g via INTRAVENOUS

## 2013-09-28 MED ORDER — BUPIVACAINE LIPOSOME 1.3 % IJ SUSP
20.0000 mL | Freq: Once | INTRAMUSCULAR | Status: AC
  Administered 2013-09-28: 20 mL
  Filled 2013-09-28: qty 20

## 2013-09-28 MED ORDER — PROPOFOL INFUSION 10 MG/ML OPTIME
INTRAVENOUS | Status: DC | PRN
  Administered 2013-09-28: 140 ug/kg/min via INTRAVENOUS

## 2013-09-28 MED ORDER — ASPIRIN EC 325 MG PO TBEC
325.0000 mg | DELAYED_RELEASE_TABLET | Freq: Two times a day (BID) | ORAL | Status: DC
  Administered 2013-09-29 – 2013-09-30 (×3): 325 mg via ORAL
  Filled 2013-09-28 (×5): qty 1

## 2013-09-28 MED ORDER — METFORMIN HCL 500 MG PO TABS
1000.0000 mg | ORAL_TABLET | Freq: Two times a day (BID) | ORAL | Status: DC
  Administered 2013-09-28 – 2013-09-30 (×4): 1000 mg via ORAL
  Filled 2013-09-28 (×6): qty 2

## 2013-09-28 MED ORDER — TRANEXAMIC ACID 100 MG/ML IV SOLN
1000.0000 mg | Freq: Once | INTRAVENOUS | Status: AC
  Administered 2013-09-28: 1000 mg via INTRAVENOUS
  Filled 2013-09-28: qty 10

## 2013-09-28 MED ORDER — PROPOFOL 10 MG/ML IV BOLUS
INTRAVENOUS | Status: DC | PRN
  Administered 2013-09-28: 30 mg via INTRAVENOUS

## 2013-09-28 MED ORDER — SODIUM CHLORIDE 0.9 % IR SOLN
Status: DC | PRN
  Administered 2013-09-28: 1000 mL

## 2013-09-28 MED ORDER — BUPIVACAINE IN DEXTROSE 0.75-8.25 % IT SOLN
INTRATHECAL | Status: DC | PRN
  Administered 2013-09-28: 2 mL via INTRATHECAL

## 2013-09-28 MED ORDER — POLYETHYLENE GLYCOL 3350 17 G PO PACK
17.0000 g | PACK | Freq: Two times a day (BID) | ORAL | Status: DC
  Administered 2013-09-29 – 2013-09-30 (×3): 17 g via ORAL

## 2013-09-28 MED ORDER — DOCUSATE SODIUM 100 MG PO CAPS
100.0000 mg | ORAL_CAPSULE | Freq: Two times a day (BID) | ORAL | Status: DC
  Administered 2013-09-28 – 2013-09-30 (×4): 100 mg via ORAL

## 2013-09-28 MED ORDER — HYDROMORPHONE HCL PF 1 MG/ML IJ SOLN: 0.2500 mg | INTRAMUSCULAR | Status: DC | PRN

## 2013-09-28 MED ORDER — HYDROCODONE-ACETAMINOPHEN 7.5-325 MG PO TABS
1.0000 | ORAL_TABLET | ORAL | Status: DC
  Administered 2013-09-28: 1 via ORAL
  Administered 2013-09-28: 2 via ORAL
  Administered 2013-09-28: 1 via ORAL
  Administered 2013-09-29 – 2013-09-30 (×7): 2 via ORAL
  Filled 2013-09-28 (×8): qty 2
  Filled 2013-09-28: qty 1
  Filled 2013-09-28 (×2): qty 2
  Filled 2013-09-28: qty 1

## 2013-09-28 MED ORDER — METHOCARBAMOL 500 MG PO TABS
500.0000 mg | ORAL_TABLET | Freq: Four times a day (QID) | ORAL | Status: DC | PRN
Start: 2013-09-28 — End: 2013-09-30
  Filled 2013-09-28: qty 1

## 2013-09-28 MED ORDER — BUPIVACAINE-EPINEPHRINE (PF) 0.25% -1:200000 IJ SOLN
INTRAMUSCULAR | Status: DC | PRN
  Administered 2013-09-28: 30 mL

## 2013-09-28 MED ORDER — ALUM & MAG HYDROXIDE-SIMETH 200-200-20 MG/5ML PO SUSP
30.0000 mL | ORAL | Status: DC | PRN
  Administered 2013-09-29: 30 mL via ORAL
  Filled 2013-09-28: qty 30

## 2013-09-28 MED ORDER — PHENOL 1.4 % MT LIQD
1.0000 | OROMUCOSAL | Status: DC | PRN
  Filled 2013-09-28: qty 177

## 2013-09-28 MED ORDER — ONDANSETRON HCL 4 MG PO TABS: 4.0000 mg | ORAL_TABLET | Freq: Four times a day (QID) | ORAL | Status: DC | PRN

## 2013-09-28 MED ORDER — MAGNESIUM CITRATE PO SOLN: 1.0000 | Freq: Once | ORAL | Status: AC | PRN

## 2013-09-28 MED ORDER — PHENYLEPHRINE HCL 10 MG/ML IJ SOLN
INTRAMUSCULAR | Status: DC | PRN
  Administered 2013-09-28 (×2): 80 ug via INTRAVENOUS
  Administered 2013-09-28: 40 ug via INTRAVENOUS
  Administered 2013-09-28: 120 ug via INTRAVENOUS

## 2013-09-28 MED ORDER — LACTATED RINGERS IV SOLN
INTRAVENOUS | Status: DC
  Administered 2013-09-28: 1000 mL via INTRAVENOUS

## 2013-09-28 MED ORDER — METHOCARBAMOL 1000 MG/10ML IJ SOLN
500.0000 mg | Freq: Four times a day (QID) | INTRAVENOUS | Status: DC | PRN
  Administered 2013-09-28: 500 mg via INTRAVENOUS
  Filled 2013-09-28 (×2): qty 5

## 2013-09-28 MED ORDER — METOCLOPRAMIDE HCL 5 MG/ML IJ SOLN: 5.0000 mg | Freq: Three times a day (TID) | INTRAMUSCULAR | Status: DC | PRN

## 2013-09-28 MED ORDER — SODIUM CHLORIDE 0.9 % IJ SOLN
INTRAMUSCULAR | Status: DC | PRN
  Administered 2013-09-28: 9 mL

## 2013-09-28 MED ORDER — HYDROMORPHONE HCL PF 1 MG/ML IJ SOLN
0.5000 mg | INTRAMUSCULAR | Status: DC | PRN
  Administered 2013-09-28: 0.5 mg via INTRAVENOUS
  Filled 2013-09-28: qty 1

## 2013-09-28 MED ORDER — MEPERIDINE HCL 50 MG/ML IJ SOLN
6.2500 mg | INTRAMUSCULAR | Status: DC | PRN
  Administered 2013-09-28: 6.25 mg via INTRAVENOUS

## 2013-09-28 MED ORDER — MEPERIDINE HCL 50 MG/ML IJ SOLN
INTRAMUSCULAR | Status: AC
  Filled 2013-09-28: qty 1

## 2013-09-28 MED ORDER — INSULIN ASPART 100 UNIT/ML ~~LOC~~ SOLN
0.0000 [IU] | Freq: Three times a day (TID) | SUBCUTANEOUS | Status: DC
  Administered 2013-09-29: 2 [IU] via SUBCUTANEOUS
  Administered 2013-09-29: 3 [IU] via SUBCUTANEOUS
  Administered 2013-09-30: 2 [IU] via SUBCUTANEOUS

## 2013-09-28 SURGICAL SUPPLY — 57 items
ADH SKN CLS APL DERMABOND .7 (GAUZE/BANDAGES/DRESSINGS) ×1
BAG SPEC THK2 15X12 ZIP CLS (MISCELLANEOUS)
BAG ZIPLOCK 12X15 (MISCELLANEOUS) ×1 IMPLANT
BANDAGE ELASTIC 6 VELCRO ST LF (GAUZE/BANDAGES/DRESSINGS) ×3 IMPLANT
BANDAGE ESMARK 6X9 LF (GAUZE/BANDAGES/DRESSINGS) ×1 IMPLANT
BLADE SAW SGTL 13.0X1.19X90.0M (BLADE) ×3 IMPLANT
BNDG CMPR 9X6 STRL LF SNTH (GAUZE/BANDAGES/DRESSINGS) ×1
BNDG ESMARK 6X9 LF (GAUZE/BANDAGES/DRESSINGS) ×3
BONE CEMENT GENTAMICIN (Cement) ×6 IMPLANT
BOWL SMART MIX CTS (DISPOSABLE) ×3 IMPLANT
CAP KNEE ATTUNE RP ×2 IMPLANT
CEMENT BONE GENTAMICIN 40 (Cement) IMPLANT
CUFF TOURN SGL QUICK 34 (TOURNIQUET CUFF) ×3
CUFF TRNQT CYL 34X4X40X1 (TOURNIQUET CUFF) ×1 IMPLANT
DERMABOND ADVANCED (GAUZE/BANDAGES/DRESSINGS) ×2
DERMABOND ADVANCED .7 DNX12 (GAUZE/BANDAGES/DRESSINGS) ×1 IMPLANT
DRAPE EXTREMITY T 121X128X90 (DRAPE) ×3 IMPLANT
DRAPE POUCH INSTRU U-SHP 10X18 (DRAPES) ×3 IMPLANT
DRAPE U-SHAPE 47X51 STRL (DRAPES) ×3 IMPLANT
DRSG AQUACEL AG ADV 3.5X10 (GAUZE/BANDAGES/DRESSINGS) ×3 IMPLANT
DURAPREP 26ML APPLICATOR (WOUND CARE) ×6 IMPLANT
ELECT REM PT RETURN 9FT ADLT (ELECTROSURGICAL) ×3
ELECTRODE REM PT RTRN 9FT ADLT (ELECTROSURGICAL) ×1 IMPLANT
FACESHIELD WRAPAROUND (MASK) ×15 IMPLANT
FACESHIELD WRAPAROUND OR TEAM (MASK) ×5 IMPLANT
GLOVE BIOGEL PI IND STRL 7.5 (GLOVE) ×1 IMPLANT
GLOVE BIOGEL PI IND STRL 8 (GLOVE) ×1 IMPLANT
GLOVE BIOGEL PI INDICATOR 7.5 (GLOVE) ×2
GLOVE BIOGEL PI INDICATOR 8 (GLOVE) ×2
GLOVE ECLIPSE 8.0 STRL XLNG CF (GLOVE) ×3 IMPLANT
GLOVE ORTHO TXT STRL SZ7.5 (GLOVE) ×6 IMPLANT
GOWN SPEC L3 XXLG W/TWL (GOWN DISPOSABLE) ×3 IMPLANT
GOWN SRG XL XLNG 56XLVL 4 (GOWN DISPOSABLE) IMPLANT
GOWN STRL NON-REIN XL XLG LVL4 (GOWN DISPOSABLE) ×3
GOWN STRL REUS W/TWL LRG LVL3 (GOWN DISPOSABLE) ×3 IMPLANT
GOWN STRL REUS W/TWL XL LVL3 (GOWN DISPOSABLE) ×4 IMPLANT
HANDPIECE INTERPULSE COAX TIP (DISPOSABLE) ×3
KIT BASIN OR (CUSTOM PROCEDURE TRAY) ×3 IMPLANT
MANIFOLD NEPTUNE II (INSTRUMENTS) ×3 IMPLANT
NDL SAFETY ECLIPSE 18X1.5 (NEEDLE) ×1 IMPLANT
NEEDLE HYPO 18GX1.5 SHARP (NEEDLE) ×3
PACK TOTAL JOINT (CUSTOM PROCEDURE TRAY) ×3 IMPLANT
POSITIONER SURGICAL ARM (MISCELLANEOUS) ×3 IMPLANT
SET HNDPC FAN SPRY TIP SCT (DISPOSABLE) ×1 IMPLANT
SET PAD KNEE POSITIONER (MISCELLANEOUS) ×3 IMPLANT
SUCTION FRAZIER 12FR DISP (SUCTIONS) ×3 IMPLANT
SUT MNCRL AB 4-0 PS2 18 (SUTURE) ×3 IMPLANT
SUT VIC AB 1 CT1 36 (SUTURE) ×3 IMPLANT
SUT VIC AB 2-0 CT1 27 (SUTURE) ×9
SUT VIC AB 2-0 CT1 TAPERPNT 27 (SUTURE) ×3 IMPLANT
SUT VLOC 180 0 24IN GS25 (SUTURE) ×3 IMPLANT
SYR 50ML LL SCALE MARK (SYRINGE) ×3 IMPLANT
TOWEL OR 17X26 10 PK STRL BLUE (TOWEL DISPOSABLE) ×3 IMPLANT
TOWEL OR NON WOVEN STRL DISP B (DISPOSABLE) ×2 IMPLANT
TRAY FOLEY CATH 14FRSI W/METER (CATHETERS) ×3 IMPLANT
WATER STERILE IRR 1500ML POUR (IV SOLUTION) ×3 IMPLANT
WRAP KNEE MAXI GEL POST OP (GAUZE/BANDAGES/DRESSINGS) ×3 IMPLANT

## 2013-09-28 NOTE — Progress Notes (Signed)
Medication given for shaking and shivering

## 2013-09-28 NOTE — Plan of Care (Signed)
Problem: Consults Goal: Diagnosis- Total Joint Replacement Primary Total Knee     

## 2013-09-28 NOTE — Anesthesia Procedure Notes (Signed)
Spinal  Patient location during procedure: OR Start time: 09/28/2013 11:09 AM End time: 09/28/2013 11:12 AM Staffing Anesthesiologist: Nolon Nations R Performed by: anesthesiologist  Preanesthetic Checklist Completed: patient identified, site marked, surgical consent, pre-op evaluation, timeout performed, IV checked, risks and benefits discussed and monitors and equipment checked Spinal Block Patient position: sitting Prep: Betadine Patient monitoring: heart rate, continuous pulse ox and blood pressure Location: L3-4 Injection technique: single-shot Needle Needle type: Sprotte  Needle gauge: 24 G Needle length: 9 cm Assessment Sensory level: T8 Additional Notes Expiration date of kit checked and confirmed. Patient tolerated procedure well, without complications.

## 2013-09-28 NOTE — Progress Notes (Signed)
Utilization review completed.  

## 2013-09-28 NOTE — Interval H&P Note (Signed)
History and Physical Interval Note:  09/28/2013 8:42 AM  [name]  has presented today for surgery, with the diagnosis of RIGHT KNEE OA  The various methods of treatment have been discussed with the patient and family. After consideration of risks, benefits and other options for treatment, the patient has consented to  Procedure(s): RIGHT TOTAL KNEE ARTHROPLASTY (Right) as a surgical intervention .  The patient's history has been reviewed, patient examined, no change in status, stable for surgery.  I have reviewed the patient's chart and labs.  Questions were answered to the patient's satisfaction.     Mauri Pole

## 2013-09-28 NOTE — Op Note (Signed)
NAME:  [place] RECORD NO.:  [id number]                             FACILITY:  [facility]      PHYSICIAN:  Pietro Cassis. Alvan Dame, M.D.  DATE OF BIRTH:  February 01, 1951      DATE OF PROCEDURE:  09/28/2013                                     OPERATIVE REPORT         PREOPERATIVE DIAGNOSIS:  Right knee osteoarthritis.      POSTOPERATIVE DIAGNOSIS:  Right knee osteoarthritis.      FINDINGS:  The patient was noted to have complete loss of cartilage and   bone-on-bone arthritis with associated osteophytes in the lateral and patellofemoral compartments of   the knee with a significant synovitis and associated effusion.      PROCEDURE:  Right total knee replacement.      COMPONENTS USED:  DePuy Attune rotating platform posterior stabilized knee   system, a size 3 standard femur, 3 tibia, size 6 insert, and 32 anatomic patellar   button.      SURGEON:  Pietro Cassis. Alvan Dame, M.D.      ASSISTANT:  Molli Barrows, PA-C.      ANESTHESIA:  Spinal.      SPECIMENS:  None.      COMPLICATION:  None.      DRAINS:  None.  EBL: ,100cc      TOURNIQUET TIME:   Total Tourniquet Time Documented: Thigh (Right) - 38 minutes Total: Thigh (Right) - 38 minutes  .      The patient was stable to the recovery room.      INDICATION FOR PROCEDURE:  KINZEY SHERIFF is a 63 y.o. female patient of   mine.  The patient had been seen, evaluated, and treated conservatively in the   office with medication, activity modification, and injections.  The patient had   radiographic changes of bone-on-bone arthritis with endplate sclerosis and osteophytes noted.      The patient failed conservative measures including medication, injections, and activity modification, and at this point was ready for more definitive measures.   Based on the radiographic changes and failed conservative measures, the patient   decided to proceed with total knee replacement.  Risks of infection,   DVT, component failure,  need for revision surgery, postop course, and   expectations were all   discussed and reviewed.  Consent was obtained for benefit of pain   relief.      PROCEDURE IN DETAIL:  The patient was brought to the operative theater.   Once adequate anesthesia, preoperative antibiotics, 2 gm of Ancef administered, the patient was positioned supine with the right thigh tourniquet placed.  The  right lower extremity was prepped and draped in sterile fashion.  A time-   out was performed identifying the patient, planned procedure, and   extremity.      The right lower extremity was placed in the [facility] leg holder.  The leg was   exsanguinated, tourniquet elevated to 250 mmHg.  A midline incision was   made followed by median parapatellar arthrotomy.  Following initial   exposure, attention  was first directed to the patella.  Precut   measurement was noted to be 21 mm.  I resected down to 14 mm and used a   32 anatomic patellar button to restore patellar height as well as cover the cut   surface.      The lug holes were drilled and a metal shim was placed to protect the   patella from retractors and saw blades.      At this point, attention was now directed to the femur.  The femoral   canal was opened with a drill, irrigated to try to prevent fat emboli.  An   intramedullary rod was passed at 5 degrees valgus, 8 mm of bone was   resected off the distal femur due to pre-operative hyperextension passively.  Following this resection, the tibia was   subluxated anteriorly.  Using the extramedullary guide, 4 mm of bone was resected off   the proximal lateral tibia, the most affected.  We confirmed the gap would be   stable medially and laterally with a size 5 spacer block as well as confirmed   the cut was perpendicular in the coronal plane, checking with an alignment rod.      Once this was done, I sized the femur to be a size 3 in the anterior-   posterior dimension, chose a standard component based  on medial and   lateral dimension.  The size 3 rotation block was then pinned in   position anterior referenced using the C-clamp to set rotation.  The   anterior, posterior, and  chamfer cuts were made without difficulty nor   notching making certain that I was along the anterior cortex to help   with flexion gap stability.      The final box cut was made off the lateral aspect of distal femur.      At this point, the tibia was sized to be a size 3, the size 3 tray was   then pinned in position through the medial third of the tubercle,   drilled, and keel punched.  Trial reduction was now carried with a 3 femur,  3 tibia, a size 6 insert, and the 32 patella botton.  The knee was brought to   extension, full extension with good flexion stability with the patella   tracking through the trochlea without application of pressure.  Given   all these findings, the trial components removed.  Final components were   opened and cement was mixed.  The knee was irrigated with normal saline   solution and pulse lavage.  The synovial lining was   then injected with 20cc of Exparel, 30 cc0.25% Marcaine with epinephrine and 1 cc of Toradol,   total of 61 cc.      The knee was irrigated.  Final implants were then cemented onto clean and   dried cut surfaces of bone with the knee brought to extension with a size 6 trial insert.      Once the cement had fully cured, the excess cement was removed   throughout the knee.  I confirmed I was satisfied with the range of   motion and stability, and the final size 6 AOX insert was chosen.  It was   placed into the knee.      The tourniquet had been let down at 38 minutes.  No significant   hemostasis required.  The extensor mechanism was then reapproximated using #1 Vicryl with the knee  in flexion.  The remaining wound was closed with 2-0 Vicryl and running 4-0 Monocryl.   The knee was cleaned, dried, dressed sterilely using Dermabond and   Aquacel  dressing.  The patient was then   brought to recovery room in stable condition, tolerating the procedure   well.   Please note that Physician Assistant, Molli Barrows, was present for the entirety of the case, and was utilized for pre-operative positioning, peri-operative retractor management, general facilitation of the procedure.  He was also utilized for primary wound closure at the end of the case.              Pietro Cassis Alvan Dame, M.D.    09/28/2013 12:40 PM

## 2013-09-28 NOTE — Progress Notes (Signed)
Shaking and shivering stopped. 

## 2013-09-28 NOTE — Progress Notes (Signed)
Clinical Social Work Department CLINICAL SOCIAL WORK PLACEMENT NOTE 09/28/2013  Patient:  [name], [name]  Account Number:  [id number] Admit date:  09/28/2013  Clinical Social Worker:  Werner Lean, LCSW  Date/time:  09/28/2013 04:13 PM  Clinical Social Work is seeking post-discharge placement for this patient at the following level of care:   SKILLED NURSING   (*CSW will update this form in Epic as items are completed)     Patient/family provided with [place] Department of Clinical Social Work's list of facilities offering this level of care within the geographic area requested by the patient (or if unable, by the patient's family).  09/28/2013  Patient/family informed of their freedom to choose among providers that offer the needed level of care, that participate in Medicare, Medicaid or managed care program needed by the patient, have an available bed and are willing to accept the patient.    Patient/family informed of [facility]' ownership interest in [facility], as well as of the fact that they are under no obligation to receive care at this facility.  PASARR submitted to EDS on 09/28/2013 PASARR number received from [employer] on 09/28/2013  FL2 transmitted to all facilities in geographic area requested by pt/family on  09/28/2013 FL2 transmitted to all facilities within larger geographic area on   Patient informed that his/her managed care company has contracts with or will negotiate with  certain facilities, including the following:     Patient/family informed of bed offers received:   Patient chooses bed at  Physician recommends and patient chooses bed at    Patient to be transferred to  on   Patient to be transferred to facility by   The following physician request were entered in Epic:   Additional Comments:  Werner Lean LCSW [phone]

## 2013-09-28 NOTE — Transfer of Care (Signed)
Immediate Anesthesia Transfer of Care Note  Patient: [name]  Procedure(s) Performed: Procedure(s): RIGHT TOTAL KNEE ARTHROPLASTY (Right)  Patient Location: PACU  Anesthesia Type:Regional and Spinal  Level of Consciousness: awake, alert  and patient cooperative  Airway & Oxygen Therapy: Patient Spontanous Breathing and Patient connected to face mask oxygen  Post-op Assessment: Report given to PACU RN and Post -op Vital signs reviewed and stable  Post vital signs: Reviewed and stable  Complications: No apparent anesthesia complications

## 2013-09-28 NOTE — Anesthesia Preprocedure Evaluation (Signed)
Anesthesia Evaluation  Patient identified by MRN, date of birth, ID band Patient awake    Reviewed: Allergy & Precautions, H&P , NPO status , Patient's Chart, lab work & pertinent test results  Airway Mallampati: II TM Distance: >3 FB Neck ROM: Full    Dental  (+) Dental Advisory Given   Pulmonary neg pulmonary ROS,  breath sounds clear to auscultation        Cardiovascular hypertension, Pt. on medications Rhythm:Regular Rate:Normal     Neuro/Psych negative neurological ROS  negative psych ROS   GI/Hepatic negative GI ROS, Neg liver ROS,   Endo/Other  diabetes, Type 2, Oral Hypoglycemic Agents  Renal/GU negative Renal ROS     Musculoskeletal negative musculoskeletal ROS (+)   Abdominal (+) + obese,   Peds  Hematology negative hematology ROS (+)   Anesthesia Other Findings   Reproductive/Obstetrics negative OB ROS                           Anesthesia Physical Anesthesia Plan  ASA: III  Anesthesia Plan:    Post-op Pain Management:    Induction:   Airway Management Planned:   Additional Equipment:   Intra-op Plan:   Post-operative Plan:   Informed Consent: I have reviewed the patients History and Physical, chart, labs and discussed the procedure including the risks, benefits and alternatives for the proposed anesthesia with the patient or authorized representative who has indicated his/her understanding and acceptance.   Dental advisory given  Plan Discussed with: CRNA  Anesthesia Plan Comments:         Anesthesia Quick Evaluation

## 2013-09-28 NOTE — Anesthesia Postprocedure Evaluation (Signed)
Anesthesia Post Note  Patient: [name]  Procedure(s) Performed: Procedure(s) (LRB): RIGHT TOTAL KNEE ARTHROPLASTY (Right)  Anesthesia type: Spinal  Patient location: PACU  Post pain: Pain level controlled  Post assessment: Post-op Vital signs reviewed  Last Vitals: BP 112/67  Pulse 67  Temp(Src) 36.8 C (Oral)  Resp 16  Ht 5\' 1"  (1.549 m)  Wt 203 lb (92.08 kg)  BMI 38.38 kg/m2  SpO2 100%  Post vital signs: Reviewed  Level of consciousness: sedated  Complications: No apparent anesthesia complications

## 2013-09-28 NOTE — Progress Notes (Signed)
Clinical Social Work Department BRIEF PSYCHOSOCIAL ASSESSMENT 09/28/2013  Patient:  [name], [name]     Account Number:  [id number]     Admit date:  09/28/2013  Clinical Social Worker:  Lacie Scotts  Date/Time:  09/28/2013 04:06 PM  Referred by:  Physician  Date Referred:  09/28/2013 Referred for  SNF Placement   Other Referral:   Interview type:  Patient Other interview type:    PSYCHOSOCIAL DATA Living Status:  ALONE Admitted from facility:   Level of care:   Primary support name:  Dwain Sarna Primary support relationship to patient:  FAMILY Degree of support available:   unclear    CURRENT CONCERNS Current Concerns  Post-Acute Placement   Other Concerns:    SOCIAL WORK ASSESSMENT / PLAN Pt is a 63 yr old female living at home prior to hospitalization. CSW met with pt to assist with d/c planning. This is a planned admission. Pt has made prior arrangements to have ST Rehab at [facility] following hospital d/c. CSW has contacted SNF and is waiting for confirmation of d/c plan. CSW will continue to follow to assist with d/c planning.   Assessment/plan status:  Psychosocial Support/Ongoing Assessment of Needs Other assessment/ plan:   Information/referral to community resources:   Insurance coverage for SNF and ambulance transport reviewed.    PATIENT'S/FAMILY'S RESPONSE TO PLAN OF CARE: Pt is relieved her surgery is over. Her pain is controlled at this time. Her mood is bright. Pt is looking forward to her recovery and placement at [facility].   Werner Lean LCSW [phone]

## 2013-09-29 ENCOUNTER — Encounter (HOSPITAL_COMMUNITY): Payer: Self-pay | Admitting: *Deleted

## 2013-09-29 DIAGNOSIS — D500 Iron deficiency anemia secondary to blood loss (chronic): Secondary | ICD-10-CM | POA: Diagnosis not present

## 2013-09-29 LAB — BASIC METABOLIC PANEL
BUN: 14 mg/dL (ref 6–23)
CHLORIDE: 102 meq/L (ref 96–112)
CO2: 23 mEq/L (ref 19–32)
Calcium: 8.8 mg/dL (ref 8.4–10.5)
Creatinine, Ser: 0.99 mg/dL (ref 0.50–1.10)
GFR calc Af Amer: 68 mL/min — ABNORMAL LOW (ref >90)
GFR calc non Af Amer: 59 mL/min — ABNORMAL LOW (ref >90)
Glucose, Bld: 139 mg/dL — ABNORMAL HIGH (ref 70–99)
POTASSIUM: 3.9 meq/L (ref 3.7–5.3)
SODIUM: 138 meq/L (ref 137–147)

## 2013-09-29 LAB — GLUCOSE, CAPILLARY
GLUCOSE-CAPILLARY: 109 mg/dL — AB (ref 70–99)
GLUCOSE-CAPILLARY: 161 mg/dL — AB (ref 70–99)
Glucose-Capillary: 89 mg/dL (ref 70–99)
Glucose-Capillary: 142 mg/dL — ABNORMAL HIGH (ref 70–99)
Glucose-Capillary: 156 mg/dL — ABNORMAL HIGH (ref 70–99)

## 2013-09-29 LAB — CBC
HEMATOCRIT: 31.1 % — AB (ref 36.0–46.0)
Hemoglobin: 10.7 g/dL — ABNORMAL LOW (ref 12.0–15.0)
MCH: 29.5 pg (ref 26.0–34.0)
MCHC: 34.4 g/dL (ref 30.0–36.0)
MCV: 85.7 fL (ref 78.0–100.0)
PLATELETS: 215 10*3/uL (ref 150–400)
RBC: 3.63 MIL/uL — ABNORMAL LOW (ref 3.87–5.11)
RDW: 13.7 % (ref 11.5–15.5)
WBC: 6.9 10*3/uL (ref 4.0–10.5)

## 2013-09-29 MED ORDER — DSS 100 MG PO CAPS: 100.0000 mg | ORAL_CAPSULE | Freq: Two times a day (BID) | ORAL | Status: DC

## 2013-09-29 MED ORDER — METHOCARBAMOL 500 MG PO TABS: 500.0000 mg | ORAL_TABLET | Freq: Four times a day (QID) | ORAL | Status: DC | PRN

## 2013-09-29 MED ORDER — POLYETHYLENE GLYCOL 3350 17 G PO PACK: 17.0000 g | PACK | Freq: Two times a day (BID) | ORAL | Status: DC

## 2013-09-29 MED ORDER — FERROUS SULFATE 325 (65 FE) MG PO TABS: 325.0000 mg | ORAL_TABLET | Freq: Three times a day (TID) | ORAL | Status: DC

## 2013-09-29 MED ORDER — HYDROCODONE-ACETAMINOPHEN 7.5-325 MG PO TABS: 1.0000 | ORAL_TABLET | ORAL | Status: DC | PRN

## 2013-09-29 MED ORDER — ASPIRIN 325 MG PO TBEC
325.0000 mg | DELAYED_RELEASE_TABLET | Freq: Two times a day (BID) | ORAL | Status: AC
Start: 2013-09-29 — End: 2013-10-27

## 2013-09-29 NOTE — Plan of Care (Signed)
Problem: Consults Goal: Diagnosis- Total Joint Replacement Outcome: Completed/Met Date Met:  09/29/13 Primary Total Knee RIGHT

## 2013-09-29 NOTE — Evaluation (Signed)
Physical Therapy Evaluation Patient Details Name: [name] MRN: [id number] DOB: 06/14/1950 Today's Date: 09/29/2013   History of Present Illness     Clinical Impression  Pt s/p R TKR presents with decreased R LE strength/ROM and post op pain limiting functional mobility.  Pt would benefit from follow up rehab at SNF level to maximize IND and safety prior to return home with ltd assist.    Follow Up Recommendations SNF    Equipment Recommendations  Rolling walker with 5" wheels    Recommendations for Other Services OT consult     Precautions / Restrictions Precautions Precautions: Knee;Fall Restrictions Weight Bearing Restrictions: No Other Position/Activity Restrictions: WBAT      Mobility  Bed Mobility Overal bed mobility: Needs Assistance Bed Mobility: Supine to Sit     Supine to sit: Min assist     General bed mobility comments: cues for sequence and use of L LE to self assist  Transfers Overall transfer level: Needs assistance Equipment used: Rolling walker (2 wheeled) Transfers: Sit to/from Stand Sit to Stand: Min assist         General transfer comment: cues for LE management and use of UEs to self assist  Ambulation/Gait Ambulation/Gait assistance: Min assist Ambulation Distance (Feet): 46 Feet Assistive device: Rolling walker (2 wheeled) Gait Pattern/deviations: Step-to pattern;Decreased step length - right;Decreased step length - left;Shuffle;Antalgic;Trunk flexed Gait velocity: decr   General Gait Details: cues for posture, sequence and position from [employer] Mobility    Modified Rankin (Stroke Patients Only)       Balance                                             Pertinent Vitals/Pain 4/10; premed, ice packs provided    Home Living Family/patient expects to be discharged to:: Private residence Living Arrangements: Alone               Additional Comments: Plans d/c to  SNF    Prior Function Level of Independence: Independent               Hand Dominance   Dominant Hand: Right    Extremity/Trunk Assessment   Upper Extremity Assessment: Overall WFL for tasks assessed           Lower Extremity Assessment: RLE deficits/detail RLE Deficits / Details: 3/5 quads with AAROM at knee -10 - 75    Cervical / Trunk Assessment: Normal  Communication   Communication: No difficulties  Cognition Arousal/Alertness: Awake/alert Behavior During Therapy: WFL for tasks assessed/performed Overall Cognitive Status: Within Functional Limits for tasks assessed                      General Comments      Exercises Total Joint Exercises Ankle Circles/Pumps: AROM;Both;15 reps;Supine Quad Sets: AROM;Both;10 reps;Supine Heel Slides: AAROM;10 reps;Supine;Right Straight Leg Raises: AAROM;AROM;Right;15 reps;Supine      Assessment/Plan    PT Assessment Patient needs continued PT services  PT Diagnosis Difficulty walking   PT Problem List Decreased strength;Decreased range of motion;Decreased activity tolerance;Decreased mobility;Decreased knowledge of use of DME;Obesity;Pain  PT Treatment Interventions DME instruction;Gait training;Stair training;Functional mobility training;Therapeutic activities;Therapeutic exercise;Patient/family education   PT Goals (Current goals can be found in the Care Plan section) Acute Rehab PT Goals Patient Stated Goal:  Rehab and home to resume previous lifestyle with decreased pain PT Goal Formulation: With patient Time For Goal Achievement: 10/06/13 Potential to Achieve Goals: Good    Frequency 7X/week   Barriers to discharge        Co-evaluation               End of Session Equipment Utilized During Treatment: Gait belt Activity Tolerance: Patient tolerated treatment well Patient left: in chair;with call bell/phone within reach Nurse Communication: Mobility status         Time: 6384-5364 PT  Time Calculation (min): 30 min   Charges:   PT Evaluation $Initial PT Evaluation Tier I: 1 Procedure PT Treatments $Gait Training: 8-22 mins $Therapeutic Exercise: 8-22 mins   PT G Codes:          Shauntae Reitman P Rayven Hendrickson 09/29/2013, 10:00 AM

## 2013-09-29 NOTE — Progress Notes (Signed)
   Subjective: 1 Day Post-Op Procedure(s) (LRB): RIGHT TOTAL KNEE ARTHROPLASTY (Right)   Patient reports pain as mild, pain controlled. No events throughout the night. Denies any CP or SOB.  Objective:   VITALS:   Filed Vitals:   09/29/13 0500  BP: 119/77  Pulse: 69  Temp: 98.4 F (36.9 C)  Resp: 16    Neurovascular intact Dorsiflexion/Plantar flexion intact Incision: dressing C/D/I No cellulitis present Compartment soft  LABS  Recent Labs  09/29/13 0435  HGB 10.7*  HCT 31.1*  WBC 6.9  PLT 215     Recent Labs  09/29/13 0435  NA 138  K 3.9  BUN 14  CREATININE 0.99  GLUCOSE 139*     Assessment/Plan: 1 Day Post-Op Procedure(s) (LRB): RIGHT TOTAL KNEE ARTHROPLASTY (Right) Foley cath d/c'ed Advance diet Up with therapy D/C IV fluids Discharge to SNF eventually, when ready  Expected ABLA  Treated with iron and will observe  Obese (BMI 30-39.9) Estimated body mass index is 38.38 kg/(m^2) as calculated from the following:   Height as of this encounter: 5\' 1"  (1.549 m).   Weight as of this encounter: 92.08 kg (203 lb). Patient also counseled that weight may inhibit the healing process Patient counseled that losing weight will help with future health issues        [name]   PAC  09/29/2013, 9:27 AM

## 2013-09-29 NOTE — Progress Notes (Signed)
Physical Therapy Treatment Patient Details Name: [name] MRN: [id number] DOB: 1950/08/14 Today's Date: 10-07-2013    History of Present Illness Pt admitted for R TKR and has PMH of diabetes and HTN.     PT Comments    Progressing well with mobility  Follow Up Recommendations  SNF     Equipment Recommendations  Rolling walker with 5" wheels    Recommendations for Other Services OT consult     Precautions / Restrictions Precautions Precautions: Knee;Fall Restrictions Weight Bearing Restrictions: No Other Position/Activity Restrictions: WBAT    Mobility  Bed Mobility                  Transfers Overall transfer level: Needs assistance Equipment used: Rolling walker (2 wheeled) Transfers: Sit to/from Stand Sit to Stand: Min guard         General transfer comment: verbal cues for hand placement.   Ambulation/Gait Ambulation/Gait assistance: Min guard Ambulation Distance (Feet): 50 Feet (twice) Assistive device: Rolling walker (2 wheeled) Gait Pattern/deviations: Step-to pattern;Decreased step length - right;Decreased step length - left;Shuffle;Trunk flexed     General Gait Details: cues for posture, sequence and position from [employer]            Wheelchair Mobility    Modified Rankin (Stroke Patients Only)       Balance                                    Cognition Arousal/Alertness: Awake/alert Behavior During Therapy: WFL for tasks assessed/performed Overall Cognitive Status: Within Functional Limits for tasks assessed                      Exercises      General Comments        Pertinent Vitals/Pain 5/10; meds requested;     Home Living Family/patient expects to be discharged to:: Skilled nursing facility                    Prior Function Level of Independence: Independent          PT Goals (current goals can now be found in the care plan section) Acute Rehab PT Goals Patient  Stated Goal: Rehab and home to resume previous lifestyle with decreased pain PT Goal Formulation: With patient Time For Goal Achievement: 10/06/13 Potential to Achieve Goals: Good Progress towards PT goals: Progressing toward goals    Frequency  7X/week    PT Plan Current plan remains appropriate    Co-evaluation             End of Session Equipment Utilized During Treatment: Gait belt Activity Tolerance: Patient tolerated treatment well Patient left: in chair;with call bell/phone within reach     Time: 1355-1412 PT Time Calculation (min): 17 min  Charges:  $Gait Training: 8-22 mins                    G Codes:      Mathis Fare 2013/10/07, 2:12 PM

## 2013-09-29 NOTE — Discharge Summary (Signed)
Physician Discharge Summary  Patient ID: [name] MRN: [id number] DOB/AGE: 10-22-1950 63 y.o.  Admit date: 09/28/2013 Discharge date:  09/30/2013   Procedures:  Procedure(s) (LRB): RIGHT TOTAL KNEE ARTHROPLASTY (Right)  Attending Physician:  Dr. Paralee Cancel   Admission Diagnoses:   Right knee OA / pain  Discharge Diagnoses:  Principal Problem:   S/P right TKA Active Problems:   Obese   Expected blood loss anemia  Past Medical History  Diagnosis Date  . HTN (hypertension)   . Diabetes mellitus type II   . Obesity   . Arthritis     HPI:    [name], 63 y.o. female, has a history of pain and functional disability in the right knee due to arthritis and has failed non-surgical conservative treatments for greater than 12 weeks to include NSAID's and/or analgesics and activity modification. Onset of symptoms was gradual, starting years ago with gradually worsening course since that time. The patient noted no past surgery on the right knee(s). Patient currently rates pain in the right knee(s) at 8 out of 10 with activity. Patient has night pain, worsening of pain with activity and weight bearing, pain that interferes with activities of daily living, pain with passive range of motion, crepitus and joint swelling. Patient has evidence of periarticular osteophytes and joint space narrowing by imaging studies. There is no active infection. Risks, benefits and expectations were discussed with the patient. Risks including but not limited to the risk of anesthesia, blood clots, nerve damage, blood vessel damage, failure of the prosthesis, infection and up to and including death. Patient understand the risks, benefits and expectations and wishes to proceed with surgery.  PCP: [name], MD   Discharged Condition: good  Hospital Course:  Patient underwent the above stated procedure on 09/28/2013. Patient tolerated the procedure well and brought to the recovery room in good  condition and subsequently to the floor.  POD #1 BP: 119/77 ; Pulse: 69 ; Temp: 98.4 F (36.9 C) ; Resp: 16 Patient reports pain as mild, pain controlled. No events throughout the night. Denies any CP or SOB. Neurovascular intact, dorsiflexion/plantar flexion intact, incision: dressing C/D/I, no cellulitis present and compartment soft.   LABS  Basename    HGB  10.7  HCT  31.1   POD #2  BP: 136/90 ; Pulse: 87 ; Temp: 98.1 F (36.7 C) ; Resp: 16  Patient reports pain as mild, pain controlled currently. Awaiting disposition. Neurovascular intact, dorsiflexion/plantar flexion intact, incision: dressing C/D/I, no cellulitis present and compartment soft.   LABS  Basename    HGB  10.3  HCT  30.6     Discharge Exam: General appearance: alert, cooperative and no distress Extremities: Homans sign is negative, no sign of DVT, no edema, redness or tenderness in the calves or thighs and no ulcers, gangrene or trophic changes  Disposition:  Skilled nursing facility with follow up in 2 weeks   Follow-up Information   Follow up with Mauri Pole, MD. Schedule an appointment as soon as possible for a visit in 2 weeks.   Specialty:  Orthopedic Surgery   Contact information:   [address] [place] [address] [phone]       Discharge Instructions   Call MD / Call 911    Complete by:  As directed   If you experience chest pain or shortness of breath, CALL 911 and be transported to the hospital emergency room.  If you develope a fever above 101  F, pus (white drainage) or increased drainage or redness at the wound, or calf pain, call your surgeon's office.     Change dressing    Complete by:  As directed   Maintain surgical dressing for 10-14 days, or until follow up in the clinic.     Constipation Prevention    Complete by:  As directed   Drink plenty of fluids.  Prune juice may be helpful.  You may use a stool softener, such as Colace (over the counter) 100 mg  twice a day.  Use MiraLax (over the counter) for constipation as needed.     Diet - low sodium heart healthy    Complete by:  As directed      Discharge instructions    Complete by:  As directed   Maintain surgical dressing for 10-14 days, or until follow up in the clinic. Follow up in 2 weeks at [facility]. Call with any questions or concerns.     Driving restrictions    Complete by:  As directed   No driving for 4 weeks     Increase activity slowly as tolerated    Complete by:  As directed      TED hose    Complete by:  As directed   Use stockings (TED hose) for 2 weeks on both leg(s).  You may remove them at night for sleeping.     Weight bearing as tolerated    Complete by:  As directed              Medication List         aspirin 325 MG EC tablet  Take 1 tablet (325 mg total) by mouth 2 (two) times daily.     DSS 100 MG Caps  Take 100 mg by mouth 2 (two) times daily.     ferrous sulfate 325 (65 FE) MG tablet  Take 1 tablet (325 mg total) by mouth 3 (three) times daily after meals.     fish oil-omega-3 fatty acids 1000 MG capsule  Take 1 g by mouth daily.     HYDROcodone-acetaminophen 7.5-325 MG per tablet  Commonly known as:  NORCO  Take 1-2 tablets by mouth every 4 (four) hours as needed for moderate pain.     lisinopril-hydrochlorothiazide 20-12.5 MG per tablet  Commonly known as:  PRINZIDE,ZESTORETIC  Take 2 tablets by mouth every morning.     metFORMIN 1000 MG tablet  Commonly known as:  GLUCOPHAGE  Take 1,000 mg by mouth 2 (two) times daily with a meal.     methocarbamol 500 MG tablet  Commonly known as:  ROBAXIN  Take 1 tablet (500 mg total) by mouth every 6 (six) hours as needed for muscle spasms.     multivitamin tablet  Take 1 tablet by mouth daily.     polyethylene glycol packet  Commonly known as:  MIRALAX / GLYCOLAX  Take 17 g by mouth 2 (two) times daily.         Signed: [place] Pugh. Alberta Lenhard   PA-C  09/29/2013, 9:45 AM

## 2013-09-29 NOTE — Progress Notes (Signed)
CSW assisting with d/c planning. [facility] is unable to accept pt due to insurance issues. Pt had met with [facility] rep back in April and provided insurance info . She is upset that SNF offered her placement and is now withdrawing bed offer. [facility] is able to offer rehab placement  and will begin [place] insurance authorization process. CSW will assist SNF with this process. Pt is familiar with [facility] and is willing to accept their assistance. CSW has contacted [facility] to address pt's concerns.   Werner Lean LCSW [phone]

## 2013-09-29 NOTE — Evaluation (Addendum)
Occupational Therapy Evaluation Patient Details Name: [name] MRN: [id number] DOB: December 14, 1950 Today's Date: 09/29/2013    History of Present Illness Pt admitted for R TKR and has PMH of diabetes and HTN.    Clinical Impression   Pt at min to min guard assist level with ADL currently. She is motivated and will benefit from continued rehab at SNF as she lives home alone. Will follow on acute for OT to improve ADL independence.    Follow Up Recommendations  SNF;Supervision/Assistance - 24 hour    Equipment Recommendations  None recommended by OT    Recommendations for Other Services       Precautions / Restrictions Precautions Precautions: Knee;Fall Restrictions Weight Bearing Restrictions: No Other Position/Activity Restrictions: WBAT      Mobility Bed Mobility             Transfers Overall transfer level: Needs assistance Equipment used: Rolling walker (2 wheeled) Transfers: Sit to/from Stand Sit to Stand: Min guard         General transfer comment: verbal cues for hand placement.     Balance                                            ADL Overall ADL's : Needs assistance/impaired Eating/Feeding: Independent;Sitting   Grooming: Standing;Min guard   Upper Body Bathing: Set up;Sitting   Lower Body Bathing: Sit to/from stand;Minimal assistance   Upper Body Dressing : Set up;Sitting   Lower Body Dressing: Minimal assistance;Sit to/from stand   Toilet Transfer: Minimal assistance;Ambulation;RW;BSC   Toileting- Clothing Manipulation and Hygiene: Minimal assistance;Sit to/from stand         General ADL Comments: Min verbal cues for sequence with walker and hand placement with transitions. Discussed DME and her toilet at home and explained SNF will order any equipment she may need for home. Educated on sequence for LB dressing and overall safety with LB self care tasks including having walker in front of her when she stands up.       Vision                     Perception     Praxis      Pertinent Vitals/Pain 5/10 R knee; ice; reposition     Hand Dominance Right   Extremity/Trunk Assessment Upper Extremity Assessment Upper Extremity Assessment: Overall WFL for tasks assessed   Lower Extremity Assessment Lower Extremity Assessment: RLE deficits/detail RLE Deficits / Details: 3/5 quads with AAROM at knee -10 - 75   Cervical / Trunk Assessment Cervical / Trunk Assessment: Normal   Communication Communication Communication: No difficulties   Cognition Arousal/Alertness: Awake/alert Behavior During Therapy: WFL for tasks assessed/performed Overall Cognitive Status: Within Functional Limits for tasks assessed                     General Comments       Exercises      Shoulder Instructions      Home Living Family/patient expects to be discharged to:: Skilled nursing facility Living Arrangements: Alone                               Additional Comments: Plans d/c to SNF      Prior Functioning/Environment Level of Independence: Independent  OT Diagnosis: Generalized weakness   OT Problem List: Decreased strength;Decreased knowledge of use of DME or AE   OT Treatment/Interventions: Self-care/ADL training;Patient/family education;Therapeutic activities;DME and/or AE instruction    OT Goals(Current goals can be found in the care plan section) Acute Rehab OT Goals Patient Stated Goal: Rehab and home to resume previous lifestyle with decreased pain OT Goal Formulation: With patient Time For Goal Achievement: 10/06/13 Potential to Achieve Goals: Good  OT Frequency: Min 2X/week   Barriers to D/C:            Co-evaluation              End of Session Equipment Utilized During Treatment: Rolling walker  Activity Tolerance: Patient tolerated treatment well Patient left: in chair;with call bell/phone within reach   Time: 5883-2549 OT  Time Calculation (min): 22 min Charges:  OT General Charges $OT Visit: 1 Procedure OT Evaluation $Initial OT Evaluation Tier I: 1 Procedure OT Treatments $Self Care/Home Management : 8-22 mins G-Codes:    Alycia Patten Tyrea Froberg [phone] 09/29/2013, 10:26 AM

## 2013-09-30 LAB — GLUCOSE, CAPILLARY
Glucose-Capillary: 128 mg/dL — ABNORMAL HIGH (ref 70–99)
Glucose-Capillary: 152 mg/dL — ABNORMAL HIGH (ref 70–99)

## 2013-09-30 LAB — BASIC METABOLIC PANEL
BUN: 17 mg/dL (ref 6–23)
CO2: 24 meq/L (ref 19–32)
CREATININE: 0.98 mg/dL (ref 0.50–1.10)
Calcium: 9.3 mg/dL (ref 8.4–10.5)
Chloride: 97 mEq/L (ref 96–112)
GFR calc Af Amer: 69 mL/min — ABNORMAL LOW (ref >90)
GFR calc non Af Amer: 60 mL/min — ABNORMAL LOW (ref >90)
Glucose, Bld: 164 mg/dL — ABNORMAL HIGH (ref 70–99)
Potassium: 4.4 mEq/L (ref 3.7–5.3)
Sodium: 133 mEq/L — ABNORMAL LOW (ref 137–147)

## 2013-09-30 LAB — CBC
HEMATOCRIT: 30.6 % — AB (ref 36.0–46.0)
Hemoglobin: 10.3 g/dL — ABNORMAL LOW (ref 12.0–15.0)
MCH: 28.9 pg (ref 26.0–34.0)
MCHC: 33.7 g/dL (ref 30.0–36.0)
MCV: 86 fL (ref 78.0–100.0)
PLATELETS: 213 10*3/uL (ref 150–400)
RBC: 3.56 MIL/uL — AB (ref 3.87–5.11)
RDW: 13.6 % (ref 11.5–15.5)
WBC: 7.7 10*3/uL (ref 4.0–10.5)

## 2013-09-30 NOTE — Progress Notes (Signed)
OT Cancellation Note  Patient Details Name: [name] MRN: [id number] DOB: March 21, 1951   Cancelled Treatment:    Reason Eval/Treat Not Completed: Other (comment) Pt states she has already washed up this am, dressed, and been to the toilet. She also reports already performing her grooming. Will check back another time.  Alycia Patten [name] [phone] 09/30/2013, 9:37 AM

## 2013-09-30 NOTE — Progress Notes (Signed)
Physical Therapy Treatment Patient Details Name: [name] MRN: [id number] DOB: 1950-12-10 Today's Date: 10-06-13    History of Present Illness Pt admitted for R TKR and has PMH of diabetes and HTN.     PT Comments    Progressing well and very motivated.  Plans d/c to SNF this date  Follow Up Recommendations  SNF     Equipment Recommendations  Rolling walker with 5" wheels    Recommendations for Other Services OT consult     Precautions / Restrictions Precautions Precautions: Knee;Fall Restrictions Weight Bearing Restrictions: No Other Position/Activity Restrictions: WBAT    Mobility  Bed Mobility Overal bed mobility: Needs Assistance Bed Mobility: Sit to Supine       Sit to supine: Min guard   General bed mobility comments: cues for sequence and use of L LE to self assist  Transfers Overall transfer level: Needs assistance Equipment used: Rolling walker (2 wheeled) Transfers: Sit to/from Stand Sit to Stand: Min guard         General transfer comment: verbal cues for hand placement.   Ambulation/Gait Ambulation/Gait assistance: Min guard Ambulation Distance (Feet): 75 Feet (twice) Assistive device: Rolling walker (2 wheeled) Gait Pattern/deviations: Decreased step length - right;Decreased step length - left;Step-to pattern;Shuffle;Trunk flexed     General Gait Details: cues for posture, sequence and position from [employer]            Wheelchair Mobility    Modified Rankin (Stroke Patients Only)       Balance                                    Cognition Arousal/Alertness: Awake/alert Behavior During Therapy: WFL for tasks assessed/performed Overall Cognitive Status: Within Functional Limits for tasks assessed                      Exercises Total Joint Exercises Ankle Circles/Pumps: AROM;Both;15 reps;Supine Quad Sets: AROM;Both;Supine;15 reps Heel Slides: AAROM;Supine;Right;20 reps Straight Leg  Raises: AAROM;AROM;Right;15 reps;Supine Goniometric ROM: AAROM R knee -10 - 75    General Comments        Pertinent Vitals/Pain 6/10; premed, cold packs provided    Home Living                      Prior Function            PT Goals (current goals can now be found in the care plan section) Acute Rehab PT Goals Patient Stated Goal: Rehab and home to resume previous lifestyle with decreased pain PT Goal Formulation: With patient Time For Goal Achievement: 10/06/13 Potential to Achieve Goals: Good Progress towards PT goals: Progressing toward goals    Frequency  7X/week    PT Plan Current plan remains appropriate    Co-evaluation             End of Session Equipment Utilized During Treatment: Gait belt Activity Tolerance: Patient tolerated treatment well Patient left: in bed;with call bell/phone within reach     Time: 1107-1130 PT Time Calculation (min): 23 min  Charges:  $Gait Training: 8-22 mins $Therapeutic Exercise: 8-22 mins                    G Codes:      Mathis Fare 10/06/13, 11:53 AM

## 2013-09-30 NOTE — Progress Notes (Signed)
Clinical Social Work Department CLINICAL SOCIAL WORK PLACEMENT NOTE 09/30/2013  Patient:  [name], [name]  Account Number:  [id number] Admit date:  09/28/2013  Clinical Social Worker:  Werner Lean, LCSW  Date/time:  09/28/2013 04:13 PM  Clinical Social Work is seeking post-discharge placement for this patient at the following level of care:   SKILLED NURSING   (*CSW will update this form in Epic as items are completed)     Patient/family provided with [facility] Department of Clinical Social Work's list of facilities offering this level of care within the geographic area requested by the patient (or if unable, by the patient's family).  09/28/2013  Patient/family informed of their freedom to choose among providers that offer the needed level of care, that participate in Medicare, Medicaid or managed care program needed by the patient, have an available bed and are willing to accept the patient.    Patient/family informed of [facility]' ownership interest in [facility], as well as of the fact that they are under no obligation to receive care at this facility.  PASARR submitted to EDS on 09/28/2013 PASARR number received from [employer]DS on 09/28/2013  FL2 transmitted to all facilities in geographic area requested by pt/family on  09/28/2013 FL2 transmitted to all facilities within larger geographic area on   Patient informed that his/her managed care company has contracts with or will negotiate with  certain facilities, including the following:     Patient/family informed of bed offers received:  09/29/2013 Patient chooses bed at [facility] Physician recommends and patient chooses bed at    Patient to be transferred to [facility] on  09/30/2013 Patient to be transferred to facility by [facility]  The following physician request were entered in Epic:   Additional Comments: [employer] provided authorization for SNF placement. NSG reviewed Scripts,  D/C Summary and AVS. Scripts included in d/c packet. Pt was in agreement with d/c plan.  Werner Lean LCSW [phone]

## 2013-09-30 NOTE — Progress Notes (Signed)
Pt d/c to [facility] for rehab.

## 2013-09-30 NOTE — Progress Notes (Signed)
Patient ID: [name], female   DOB: 07/19/1950, 63 y.o.   MRN: [id number] Subjective: 2 Days Post-Op Procedure(s) (LRB): RIGHT TOTAL KNEE ARTHROPLASTY (Right)    Patient reports pain as mild, pain controlled currently.  Awaiting disposition  Objective:   VITALS:   Filed Vitals:   09/29/13 2101  BP: 136/90  Pulse: 87  Temp: 98.1 F (36.7 C)  Resp: 16    Neurovascular intact Incision: dressing C/D/I  LABS  Recent Labs  09/29/13 0435 09/30/13 0425  HGB 10.7* 10.3*  HCT 31.1* 30.6*  WBC 6.9 7.7  PLT 215 213     Recent Labs  09/29/13 0435 09/30/13 0425  NA 138 133*  K 3.9 4.4  BUN 14 17  CREATININE 0.99 0.98  GLUCOSE 139* 164*    No results found for this basename: LABPT, INR,  in the last 72 hours   Assessment/Plan: 2 Days Post-Op Procedure(s) (LRB): RIGHT TOTAL KNEE ARTHROPLASTY (Right)   Up with therapy D/C IV fluids  Discharge pending insurance approval for [facility] SNF versus making arrangements at home (does not currently have any one in the house with her)

## 2013-11-16 ENCOUNTER — Other Ambulatory Visit: Payer: Self-pay | Admitting: Family Medicine

## 2013-11-22 ENCOUNTER — Encounter: Payer: Self-pay | Admitting: Family Medicine

## 2013-12-14 ENCOUNTER — Other Ambulatory Visit: Payer: Self-pay | Admitting: Family Medicine

## 2014-01-11 ENCOUNTER — Encounter: Payer: Self-pay | Admitting: Family Medicine

## 2014-01-11 ENCOUNTER — Ambulatory Visit (INDEPENDENT_AMBULATORY_CARE_PROVIDER_SITE_OTHER): Payer: Medicare Other | Admitting: Family Medicine

## 2014-01-11 VITALS — BP 110/70 | HR 80 | Temp 98.4°F | Ht 61.0 in | Wt 193.8 lb

## 2014-01-11 DIAGNOSIS — D500 Iron deficiency anemia secondary to blood loss (chronic): Secondary | ICD-10-CM

## 2014-01-11 DIAGNOSIS — R6889 Other general symptoms and signs: Secondary | ICD-10-CM | POA: Insufficient documentation

## 2014-01-11 DIAGNOSIS — M79601 Pain in right arm: Secondary | ICD-10-CM | POA: Insufficient documentation

## 2014-01-11 DIAGNOSIS — R42 Dizziness and giddiness: Secondary | ICD-10-CM

## 2014-01-11 DIAGNOSIS — M79609 Pain in unspecified limb: Secondary | ICD-10-CM

## 2014-01-11 DIAGNOSIS — I10 Essential (primary) hypertension: Secondary | ICD-10-CM

## 2014-01-11 LAB — CBC WITH DIFFERENTIAL/PLATELET
Basophils Absolute: 0.1 10*3/uL (ref 0.0–0.1)
Basophils Relative: 2.2 % (ref 0.0–3.0)
EOS PCT: 0.8 % (ref 0.0–5.0)
Eosinophils Absolute: 0 10*3/uL (ref 0.0–0.7)
HCT: 36.3 % (ref 36.0–46.0)
Hemoglobin: 11.9 g/dL — ABNORMAL LOW (ref 12.0–15.0)
Lymphocytes Relative: 50.3 % — ABNORMAL HIGH (ref 12.0–46.0)
Lymphs Abs: 3 10*3/uL (ref 0.7–4.0)
MCHC: 32.8 g/dL (ref 30.0–36.0)
MCV: 87.8 fl (ref 78.0–100.0)
Monocytes Absolute: 0.3 10*3/uL (ref 0.1–1.0)
Monocytes Relative: 5.5 % (ref 3.0–12.0)
NEUTROS PCT: 41.2 % — AB (ref 43.0–77.0)
Neutro Abs: 2.5 10*3/uL (ref 1.4–7.7)
Platelets: 401 10*3/uL — ABNORMAL HIGH (ref 150.0–400.0)
RBC: 4.14 Mil/uL (ref 3.87–5.11)
RDW: 15.6 % — ABNORMAL HIGH (ref 11.5–15.5)
WBC: 6 10*3/uL (ref 4.0–10.5)

## 2014-01-11 LAB — COMPREHENSIVE METABOLIC PANEL
ALBUMIN: 3.4 g/dL — AB (ref 3.5–5.2)
ALK PHOS: 61 U/L (ref 39–117)
ALT: 13 U/L (ref 0–35)
AST: 19 U/L (ref 0–37)
BUN: 20 mg/dL (ref 6–23)
CO2: 30 mEq/L (ref 19–32)
CREATININE: 1 mg/dL (ref 0.4–1.2)
Calcium: 9.6 mg/dL (ref 8.4–10.5)
Chloride: 103 mEq/L (ref 96–112)
GFR: 68.36 mL/min (ref >60.00)
GLUCOSE: 113 mg/dL — AB (ref 70–99)
POTASSIUM: 3.8 meq/L (ref 3.5–5.1)
Sodium: 142 mEq/L (ref 135–145)
Total Bilirubin: 0.4 mg/dL (ref 0.2–1.2)
Total Protein: 6.4 g/dL (ref 6.0–8.3)

## 2014-01-11 NOTE — Assessment & Plan Note (Signed)
Pt c/o R arm and neck discomfort/ sob/ sweating and nausea with exertion (oddly however-happens with upper body work only) Stable EKG today  Ref to cardiology for further eval  She does have risk factors incl DM Lab today-chem and cbc (pt notes she did give blood lately)

## 2014-01-11 NOTE — Assessment & Plan Note (Signed)
On exertion- arm and neck on R side - with sob/dizziness/fatigue/sweats and nausea  This only occurs with upper body work which is odd Reassuring EKG and exam  Ref to cardiol for further eval  Also adv to hold off on work outs until further eval  Will go to ER if symptoms worsen/ or occur and do not resolve

## 2014-01-11 NOTE — Patient Instructions (Addendum)
Hold off on exercise until seen by cardiology consult  Hold your blood pressure medicine entirely for the next 3 days and keep track of bp -- if it gets high (over 140/90)- go back on one pill daily  Stop at check out for referral to cardiology If symptoms return and do not improve/or if they worsen at any time go to the ER

## 2014-01-11 NOTE — Progress Notes (Signed)
Pre visit review using our clinic review tool, if applicable. No additional management support is needed unless otherwise documented below in the visit note. 

## 2014-01-11 NOTE — Assessment & Plan Note (Signed)
bp is on the low side-in light of dizziness- will have pt hold her prinizide for several days- and see how bp runs (she had already cut dose in 1/2) She has had a 10 lb wt loss (intentional) since TKA- doing great with that overall  Will ref to cardiology for exertional dizziness/ other symptoms as well

## 2014-01-11 NOTE — Progress Notes (Signed)
Subjective:    Patient ID: [name], female    DOB: 08/22/1950, 63 y.o.   MRN: [id number]  HPI Here for fatigue/malaise -intermittent for 2 weeks  Her knee surgery went well    She has lost 10 lb  With exercise (upper body only)- she will get some discomfort in R neck and arm  If lower body exercise-no problems  She thinks that using her R arm causes this  Nausea/ palpitations/ sob more than expected and dizziness  This gets better as soon as she rests   bp is lower than usual- she cut her bp med dose to 1 pill daily-has not taken yet today  EKG is stable with RSR and rate of 75    She gave blood in August as well    Patient Active Problem List   Diagnosis Date Noted  . Right arm pain 01/11/2014  . Exercise intolerance 01/11/2014  . Obese 09/29/2013  . Expected blood loss anemia 09/29/2013  . S/P right TKA 09/28/2013  . Pre-op exam 09/13/2013  . Deformity of toenail 06/14/2013  . Other screening mammogram 12/18/2010  . Routine general medical examination at a health care facility 12/08/2010  . Osteoarthritis of knee 09/13/2010  . Knee pain, bilateral 09/11/2010  . Obesity   . DIABETES-TYPE 2 02/26/2010  . HYPERTENSION 08/29/2009   Past Medical History  Diagnosis Date  . HTN (hypertension)   . Diabetes mellitus type II   . Obesity   . Arthritis    Past Surgical History  Procedure Laterality Date  . Total abdominal hysterectomy  10/1999    fibroids  . Combined abdominoplasty and liposuction  07/2000  . Total knee arthroplasty Right 09/28/2013    Procedure: RIGHT TOTAL KNEE ARTHROPLASTY;  Surgeon: Mauri Pole, MD;  Location: [facility];  Service: Orthopedics;  Laterality: Right;   History  Substance Use Topics  . Smoking status: Never Smoker   . Smokeless tobacco: Never Used  . Alcohol Use: No   Family History  Problem Relation Age of Onset  . Arthritis Mother   . Hypertension Mother   . Cancer Mother     breast, lung  . Diabetes Mother    No  Known Allergies Current Outpatient Prescriptions on File Prior to Visit  Medication Sig Dispense Refill  . docusate sodium 100 MG CAPS Take 100 mg by mouth 2 (two) times daily.  10 capsule  0  . ferrous sulfate 325 (65 FE) MG tablet Take 1 tablet (325 mg total) by mouth 3 (three) times daily after meals.    3  . fish oil-omega-3 fatty acids 1000 MG capsule Take 1 g by mouth daily.        . metFORMIN (GLUCOPHAGE) 1000 MG tablet TAKE 1 TABLET BY MOUTH TWICE A DAY WITH MEAL  60 tablet  5  . Multiple Vitamin (MULTIVITAMIN) tablet Take 1 tablet by mouth daily.        . polyethylene glycol (MIRALAX / GLYCOLAX) packet Take 17 g by mouth 2 (two) times daily.  14 each  0   No current facility-administered medications on file prior to visit.     Review of Systems Review of Systems  Constitutional: Negative for fever, appetite change,  and unexpected weight change.  Eyes: Negative for pain and visual disturbance.  Respiratory: Negative for cough and shortness of breath.   Cardiovascular: Negative for cp or palpitations  Pos for arm and neck discomfort with exertion, neg for pedal edema or leg  pain or redness/ neg for PND or orthopnea   Gastrointestinal: Negative for nausea, diarrhea and constipation.  Genitourinary: Negative for urgency and frequency.  Skin: Negative for pallor or rash   Neurological: Negative for weakness, light-headedness, numbness and headaches.  Hematological: Negative for adenopathy. Does not bruise/bleed easily.  Psychiatric/Behavioral: Negative for dysphoric mood. The patient is not nervous/anxious.         Objective:   Physical Exam  Constitutional: She appears well-developed and well-nourished. No distress.  obese and well appearing   HENT:  Head: Normocephalic and atraumatic.  Mouth/Throat: Oropharynx is clear and moist.  Eyes: Conjunctivae and EOM are normal. Pupils are equal, round, and reactive to light. Right eye exhibits no discharge. Left eye exhibits no  discharge. No scleral icterus.  Neck: Normal range of motion. Neck supple.  Cardiovascular: Normal rate and regular rhythm.   Pulmonary/Chest: Effort normal and breath sounds normal. No respiratory distress. She has no wheezes. She has no rales. She exhibits no tenderness.  Abdominal: Soft. Bowel sounds are normal. She exhibits no distension and no mass. There is no tenderness. There is no rebound and no guarding.  Musculoskeletal: She exhibits no edema and no tenderness.  R shoulder-nl rom  No swelling or tenderness  Mild trapezius tenderness on R  Lymphadenopathy:    She has no cervical adenopathy.  Neurological: She is alert. She has normal reflexes. No cranial nerve deficit. She exhibits normal muscle tone. Coordination normal.  Skin: Skin is warm and dry. No rash noted. No erythema. No pallor.  Psychiatric: She has a normal mood and affect.          Assessment & Plan:   Problem List Items Addressed This Visit     Cardiovascular and Mediastinum   HYPERTENSION     bp is on the low side-in light of dizziness- will have pt hold her prinizide for several days- and see how bp runs (she had already cut dose in 1/2) She has had a 10 lb wt loss (intentional) since TKA- doing great with that overall  Will ref to cardiology for exertional dizziness/ other symptoms as well     Relevant Medications      lisinopril-hydrochlorothiazide (PRINZIDE,ZESTORETIC) 20-12.5 MG per tablet   Other Relevant Orders      Comprehensive metabolic panel (Completed)      CBC with Differential (Completed)     Other   Expected blood loss anemia   Right arm pain     On exertion- arm and neck on R side - with sob/dizziness/fatigue/sweats and nausea  This only occurs with upper body work which is odd Reassuring EKG and exam  Ref to cardiol for further eval  Also adv to hold off on work outs until further eval  Will go to ER if symptoms worsen/ or occur and do not resolve     Relevant Orders       Ambulatory referral to Cardiology   Exercise intolerance     Pt c/o R arm and neck discomfort/ sob/ sweating and nausea with exertion (oddly however-happens with upper body work only) Stable EKG today  Ref to cardiology for further eval  She does have risk factors incl DM Lab today-chem and cbc (pt notes she did give blood lately)    Relevant Orders      Ambulatory referral to Cardiology    Other Visit Diagnoses   Dizzy    -  Primary    Relevant Orders       EKG  12-Lead (Completed)       CBC with Differential (Completed)

## 2014-01-12 ENCOUNTER — Encounter: Payer: Self-pay | Admitting: *Deleted

## 2014-01-14 ENCOUNTER — Ambulatory Visit (INDEPENDENT_AMBULATORY_CARE_PROVIDER_SITE_OTHER): Payer: Medicare Other | Admitting: Cardiovascular Disease

## 2014-01-14 ENCOUNTER — Encounter: Payer: Self-pay | Admitting: Cardiovascular Disease

## 2014-01-14 VITALS — BP 112/76 | HR 75 | Ht 62.0 in | Wt 197.8 lb

## 2014-01-14 DIAGNOSIS — R000 Tachycardia, unspecified: Secondary | ICD-10-CM

## 2014-01-14 DIAGNOSIS — M79601 Pain in right arm: Secondary | ICD-10-CM

## 2014-01-14 DIAGNOSIS — Z96659 Presence of unspecified artificial knee joint: Secondary | ICD-10-CM

## 2014-01-14 DIAGNOSIS — R6889 Other general symptoms and signs: Secondary | ICD-10-CM

## 2014-01-14 DIAGNOSIS — I10 Essential (primary) hypertension: Secondary | ICD-10-CM

## 2014-01-14 DIAGNOSIS — Z96651 Presence of right artificial knee joint: Secondary | ICD-10-CM

## 2014-01-14 DIAGNOSIS — R0602 Shortness of breath: Secondary | ICD-10-CM | POA: Insufficient documentation

## 2014-01-14 DIAGNOSIS — M79609 Pain in unspecified limb: Secondary | ICD-10-CM

## 2014-01-14 DIAGNOSIS — E119 Type 2 diabetes mellitus without complications: Secondary | ICD-10-CM

## 2014-01-14 NOTE — Patient Instructions (Signed)
You are doing well. No medication changes were made. Continue upper body exercise  Go to the app store: Search for "pulse meter" -instant heart rate, cardiograph  Please call us if you have new issues that need to be addressed before your next appt.

## 2014-01-14 NOTE — Progress Notes (Signed)
Patient ID: [name], female    DOB: 05-11-1950, 63 y.o.   MRN: [id number]  HPI Comments: Ms. Jackson Fetters is a very pleasant 63 year old woman with diabetes, ostioarthritis, status post total knee replacement, obesity who presents for evaluation of shortness of breath and discomfort in her right arm.  Recently seen by Dr. Glori Bickers. She reports that one week ago on a Monday she was scrubbing her deck with a bleach solution. She was using her arms vigorously. She developed right arm discomfort, shortness of breath, tachycardia, dizziness. Symptoms resolved after she sat down. She started scrubbing the deck again in symptoms recurred. Finally she finished the deck cleaning.   Several days later she was lifting groceries and reports having discomfort in her right arm as she was caring all of her groceries into the house, also with shortness of breath. Again had some light dizziness  She has been going to the gym on a regular basis, does elliptical, walks on the treadmill, does some light upper body weights. Typically she does not have any pain in her right arm with treadmill. No shortness of breath with exertion. She has been focusing her activities on her legs and lower body and has not been doing much work out for her arms. In general no discomfort with aerobic activity. She stopped her blood pressure medication several days ago and since then has not had any further dizziness  She does not think that she's having any heart arrhythmia EKG shows normal sinus rhythm with no significant ST or T wave changes   Outpatient Encounter Prescriptions as of 01/14/2014  Medication Sig  . fish oil-omega-3 fatty acids 1000 MG capsule Take 1 g by mouth daily.    . metFORMIN (GLUCOPHAGE) 1000 MG tablet TAKE 1 TABLET BY MOUTH TWICE A DAY WITH MEAL  . Multiple Vitamin (MULTIVITAMIN) tablet Take 1 tablet by mouth daily.    . polyethylene glycol (MIRALAX / GLYCOLAX) packet Take 17 g by mouth 2 (two) times daily.   Marland Kitchen lisinopril-hydrochlorothiazide (PRINZIDE,ZESTORETIC) 20-12.5 MG per tablet TAKE 1 tablet by mouth once daily She's currently not taking this     Review of Systems  Constitutional: Negative.   HENT: Negative.   Eyes: Negative.   Respiratory: Negative.   Cardiovascular: Negative.   Gastrointestinal: Negative.   Endocrine: Negative.   Musculoskeletal: Negative.        Right arm pain  Skin: Negative.   Allergic/Immunologic: Negative.   Neurological: Negative.   Hematological: Negative.   Psychiatric/Behavioral: Negative.   All other systems reviewed and are negative.   BP 112/76  Pulse 75  Ht 5\' 2"  (1.575 m)  Wt 197 lb 12 oz (89.699 kg)  BMI 36.16 kg/m2  Physical Exam  Nursing note and vitals reviewed. Constitutional: She is oriented to person, place, and time. She appears well-developed and well-nourished.  HENT:  Head: Normocephalic.  Nose: Nose normal.  Mouth/Throat: Oropharynx is clear and moist.  Eyes: Conjunctivae are normal. Pupils are equal, round, and reactive to light.  Neck: Normal range of motion. Neck supple. No JVD present.  Cardiovascular: Normal rate, regular rhythm, S1 normal, S2 normal, normal heart sounds and intact distal pulses.  Exam reveals no gallop and no friction rub.   No murmur heard. Pulmonary/Chest: Effort normal and breath sounds normal. No respiratory distress. She has no wheezes. She has no rales. She exhibits no tenderness.  Abdominal: Soft. Bowel sounds are normal. She exhibits no distension. There is no tenderness.  Musculoskeletal: Normal range  of motion. She exhibits no edema and no tenderness.  Lymphadenopathy:    She has no cervical adenopathy.  Neurological: She is alert and oriented to person, place, and time. Coordination normal.  Skin: Skin is warm and dry. No rash noted. No erythema.  Psychiatric: She has a normal mood and affect. Her behavior is normal. Judgment and thought content normal.    Assessment and Plan

## 2014-01-14 NOTE — Assessment & Plan Note (Signed)
Replacement has healed well, now back to her baseline

## 2014-01-14 NOTE — Assessment & Plan Note (Signed)
Able to tolerate treadmill and flat surfaces, even some pills. Intolerance when she vigorously uses her upper extremities. Suggested she start to implement upper body workout as part of her regular workout routine.

## 2014-01-14 NOTE — Assessment & Plan Note (Signed)
Weight is slowly trending down. Encouraged continued exercise and close monitoring of her diet

## 2014-01-14 NOTE — Assessment & Plan Note (Addendum)
Blood pressure continues to run low today. Suspect she will not need any blood pressure medication. Previous medication may have contributed to her dizziness symptoms

## 2014-01-14 NOTE — Assessment & Plan Note (Signed)
Etiology of her shortness of breath with Exertion of her upper extremities  is unclear. EKG and clinical exam is benign, she has good exercise tolerance with other aerobic activities, predominantly using her legs. Pulses are normal. She does feel somewhat better after holding her blood pressure pill. She may have exercise-induced arrhythmia but she denies having any funny heart beats. She will monitor her heart rhythm using her phone when she has the symptoms again.  Recommended that she start a slow but steady exercise program with her upper extremities. I suspect she may have overdone it and in the past has not been using her arms but now that her knee has improved, she is more active. Recommended light upper body weights on a regular basis as part of her work out.  She continues to have problems, we would have her perform a routine treadmill study. Echocardiogram likely of little clinical benefit as he has a benign exam and normal EKG and she exercises on a treadmill with no symptoms.

## 2014-01-31 ENCOUNTER — Ambulatory Visit: Payer: Medicare Other | Admitting: Cardiovascular Disease

## 2014-03-12 ENCOUNTER — Telehealth: Payer: Self-pay | Admitting: Family Medicine

## 2014-03-12 DIAGNOSIS — E119 Type 2 diabetes mellitus without complications: Secondary | ICD-10-CM

## 2014-03-12 DIAGNOSIS — D500 Iron deficiency anemia secondary to blood loss (chronic): Secondary | ICD-10-CM

## 2014-03-12 NOTE — Telephone Encounter (Signed)
-----   Message from Ellamae Sia sent at 03/11/2014  9:57 AM EST ----- Regarding: Lab orders for Monday, 11.9.15 Labs for f/u appt

## 2014-03-14 ENCOUNTER — Other Ambulatory Visit (INDEPENDENT_AMBULATORY_CARE_PROVIDER_SITE_OTHER): Payer: Medicare Other

## 2014-03-14 DIAGNOSIS — D500 Iron deficiency anemia secondary to blood loss (chronic): Secondary | ICD-10-CM

## 2014-03-14 DIAGNOSIS — E119 Type 2 diabetes mellitus without complications: Secondary | ICD-10-CM

## 2014-03-14 LAB — CBC WITH DIFFERENTIAL/PLATELET
Basophils Absolute: 0.1 10*3/uL (ref 0.0–0.1)
Basophils Relative: 1.6 % (ref 0.0–3.0)
Eosinophils Absolute: 0.1 10*3/uL (ref 0.0–0.7)
Eosinophils Relative: 1.1 % (ref 0.0–5.0)
HCT: 38.3 % (ref 36.0–46.0)
Hemoglobin: 12.4 g/dL (ref 12.0–15.0)
LYMPHS ABS: 3.8 10*3/uL (ref 0.7–4.0)
Lymphocytes Relative: 55.7 % — ABNORMAL HIGH (ref 12.0–46.0)
MCHC: 32.4 g/dL (ref 30.0–36.0)
MCV: 86.5 fl (ref 78.0–100.0)
MONO ABS: 0.4 10*3/uL (ref 0.1–1.0)
Monocytes Relative: 6.3 % (ref 3.0–12.0)
Neutro Abs: 2.4 10*3/uL (ref 1.4–7.7)
Neutrophils Relative %: 35.3 % — ABNORMAL LOW (ref 43.0–77.0)
Platelets: 378 10*3/uL (ref 150.0–400.0)
RBC: 4.43 Mil/uL (ref 3.87–5.11)
RDW: 16.1 % — ABNORMAL HIGH (ref 11.5–15.5)
WBC: 6.8 10*3/uL (ref 4.0–10.5)

## 2014-03-14 LAB — HEMOGLOBIN A1C: Hgb A1c MFr Bld: 6.5 % (ref 4.6–6.5)

## 2014-03-16 ENCOUNTER — Encounter: Payer: Self-pay | Admitting: Family Medicine

## 2014-03-16 ENCOUNTER — Ambulatory Visit (INDEPENDENT_AMBULATORY_CARE_PROVIDER_SITE_OTHER): Payer: Medicare Other | Admitting: Family Medicine

## 2014-03-16 VITALS — BP 132/74 | HR 72 | Temp 97.7°F | Ht 62.0 in | Wt 194.5 lb

## 2014-03-16 DIAGNOSIS — E119 Type 2 diabetes mellitus without complications: Secondary | ICD-10-CM

## 2014-03-16 DIAGNOSIS — Z23 Encounter for immunization: Secondary | ICD-10-CM

## 2014-03-16 DIAGNOSIS — I10 Essential (primary) hypertension: Secondary | ICD-10-CM

## 2014-03-16 NOTE — Patient Instructions (Signed)
Follow up in 6 months for annual exam with labs prior  Labs are stable  Keep working on weight loss  Flu shot today

## 2014-03-16 NOTE — Assessment & Plan Note (Signed)
bp in fair control at this time   (up a bit since she just came from a workout) BP Readings from Last 1 Encounters:  03/16/14 132/74   No changes needed Disc lifstyle change with low sodium diet and exercise  Labs reviewed F/u 6 mo with lab prior for annual exam

## 2014-03-16 NOTE — Progress Notes (Signed)
Subjective:    Patient ID: [name], female    DOB: 1951/01/06, 63 y.o.   MRN: [id number]  HPI Here for f/u of chronic medical problems   Notes feet swell a bit at the end of the day   Obesity Wt down 3 more lb  bmi of 35 Still working hard at weight loss   bp is stable today (just worked out)  No cp or palpitations or headaches or edema  No side effects to medicines  BP Readings from Last 3 Encounters:  03/16/14 132/74  01/14/14 112/76  01/11/14 110/70      Diabetes Home sugar results  DM diet very good /sticking with DM diet - she will try to control her eating on upcoming cruise  Exercise - still works out regularly  Symptoms-none  A1C last  Lab Results  Component Value Date   HGBA1C 6.5 03/14/2014   This was prev 6.4 No problems with medications  Renal protection on ace  Last eye exam 7/15    Lab Results  Component Value Date   WBC 6.8 03/14/2014   HGB 12.4 03/14/2014   HCT 38.3 03/14/2014   MCV 86.5 03/14/2014   PLT 378.0 03/14/2014   blood count is back to normal   Had flu shot today  Patient Active Problem List   Diagnosis Date Noted  . Shortness of breath 01/14/2014  . Right arm pain 01/11/2014  . Exercise intolerance 01/11/2014  . S/P right TKA 09/28/2013  . Deformity of toenail 06/14/2013  . Other screening mammogram 12/18/2010  . Routine general medical examination at a health care facility 12/08/2010  . Osteoarthritis of knee 09/13/2010  . Knee pain, bilateral 09/11/2010  . Obesity   . Diabetes type 2, controlled 02/26/2010  . Essential hypertension 08/29/2009   Past Medical History  Diagnosis Date  . HTN (hypertension)   . Diabetes mellitus type II   . Obesity   . Arthritis    Past Surgical History  Procedure Laterality Date  . Total abdominal hysterectomy  10/1999    fibroids  . Combined abdominoplasty and liposuction  07/2000  . Total knee arthroplasty Right 09/28/2013    Procedure: RIGHT TOTAL KNEE ARTHROPLASTY;   Surgeon: Mauri Pole, MD;  Location: [facility];  Service: Orthopedics;  Laterality: Right;   History  Substance Use Topics  . Smoking status: Never Smoker   . Smokeless tobacco: Never Used  . Alcohol Use: No   Family History  Problem Relation Age of Onset  . Arthritis Mother   . Hypertension Mother   . Cancer Mother     breast, lung  . Diabetes Mother   . Hypertension Father    No Known Allergies Current Outpatient Prescriptions on File Prior to Visit  Medication Sig Dispense Refill  . fish oil-omega-3 fatty acids 1000 MG capsule Take 1 g by mouth daily.      Marland Kitchen lisinopril-hydrochlorothiazide (PRINZIDE,ZESTORETIC) 20-12.5 MG per tablet TAKE 1 tablet by mouth once daily    . metFORMIN (GLUCOPHAGE) 1000 MG tablet TAKE 1 TABLET BY MOUTH TWICE A DAY WITH MEAL 60 tablet 5  . Multiple Vitamin (MULTIVITAMIN) tablet Take 1 tablet by mouth daily.       No current facility-administered medications on file prior to visit.    Review of Systems    Review of Systems  Constitutional: Negative for fever, appetite change, fatigue and unexpected weight change.  Eyes: Negative for pain and visual disturbance.  Respiratory: Negative for cough and  shortness of breath.   Cardiovascular: Negative for cp or palpitations   pos for swollen ankles at end of the day Gastrointestinal: Negative for nausea, diarrhea and constipation.  Genitourinary: Negative for urgency and frequency.  Skin: Negative for pallor or rash   Neurological: Negative for weakness, light-headedness, numbness and headaches.  Hematological: Negative for adenopathy. Does not bruise/bleed easily.  Psychiatric/Behavioral: Negative for dysphoric mood. The patient is not nervous/anxious.      Objective:   Physical Exam  Constitutional: She appears well-developed and well-nourished. No distress.  obese and well appearing   HENT:  Head: Normocephalic and atraumatic.  Eyes: Conjunctivae and EOM are normal. Pupils are equal, round, and  reactive to light. No scleral icterus.  Neck: Normal range of motion. Neck supple. No JVD present. Carotid bruit is not present. No thyromegaly present.  Cardiovascular: Normal rate, regular rhythm, normal heart sounds and intact distal pulses.  Exam reveals no gallop.   Pulmonary/Chest: Effort normal and breath sounds normal. No respiratory distress. She has no wheezes. She has no rales.  Musculoskeletal: She exhibits no edema or tenderness.  Lymphadenopathy:    She has no cervical adenopathy.  Neurological: She is alert. She has normal reflexes. No cranial nerve deficit. She exhibits normal muscle tone. Coordination normal.  Skin: Skin is warm and dry. No rash noted. No erythema. No pallor.  Psychiatric: She has a normal mood and affect.          Assessment & Plan:   Problem List Items Addressed This Visit      Cardiovascular and Mediastinum   Essential hypertension    bp in fair control at this time   (up a bit since she just came from a workout) BP Readings from Last 1 Encounters:  03/16/14 132/74   No changes needed Disc lifstyle change with low sodium diet and exercise  Labs reviewed F/u 6 mo with lab prior for annual exam       Endocrine   Diabetes type 2, controlled - Primary    Lab Results  Component Value Date   HGBA1C 6.5 03/14/2014   Doing well  Enc further wt loss with healthy diet and exercise  She is motivated  No change in tx  opthy exam utd Flu vaccine today  F/u 6 mo for annual exam     Other Visit Diagnoses    Need for prophylactic vaccination and inoculation against influenza        Relevant Orders       Flu Vaccine QUAD 36+ mos PF IM (Fluarix Quad PF) (Completed)

## 2014-03-16 NOTE — Assessment & Plan Note (Signed)
Lab Results  Component Value Date   HGBA1C 6.5 03/14/2014   Doing well  Enc further wt loss with healthy diet and exercise  She is motivated  No change in tx  opthy exam utd Flu vaccine today  F/u 6 mo for annual exam

## 2014-03-16 NOTE — Progress Notes (Signed)
Pre visit review using our clinic review tool, if applicable. No additional management support is needed unless otherwise documented below in the visit note. 

## 2014-04-04 ENCOUNTER — Other Ambulatory Visit: Payer: Self-pay | Admitting: Family Medicine

## 2014-06-07 ENCOUNTER — Ambulatory Visit (INDEPENDENT_AMBULATORY_CARE_PROVIDER_SITE_OTHER): Payer: Medicare Other | Admitting: Family Medicine

## 2014-06-07 ENCOUNTER — Encounter: Payer: Self-pay | Admitting: Family Medicine

## 2014-06-07 VITALS — BP 130/76 | HR 89 | Temp 98.1°F | Wt 209.8 lb

## 2014-06-07 DIAGNOSIS — I10 Essential (primary) hypertension: Secondary | ICD-10-CM

## 2014-06-07 DIAGNOSIS — R600 Localized edema: Secondary | ICD-10-CM

## 2014-06-07 DIAGNOSIS — B353 Tinea pedis: Secondary | ICD-10-CM

## 2014-06-07 LAB — BASIC METABOLIC PANEL
BUN: 15 mg/dL (ref 6–23)
CALCIUM: 8.7 mg/dL (ref 8.4–10.5)
CO2: 32 mEq/L (ref 19–32)
CREATININE: 1.1 mg/dL (ref 0.40–1.20)
Chloride: 102 mEq/L (ref 96–112)
GFR: 64 mL/min (ref >60.00)
GLUCOSE: 155 mg/dL — AB (ref 70–99)
Potassium: 3.8 mEq/L (ref 3.5–5.1)
Sodium: 137 mEq/L (ref 135–145)

## 2014-06-07 LAB — BRAIN NATRIURETIC PEPTIDE: Pro B Natriuretic peptide (BNP): 105 pg/mL — ABNORMAL HIGH (ref 0.0–100.0)

## 2014-06-07 MED ORDER — NAFTIFINE HCL 1 % EX CREA: TOPICAL_CREAM | Freq: Every day | CUTANEOUS | Status: DC

## 2014-06-07 MED ORDER — FUROSEMIDE 20 MG PO TABS: 20.0000 mg | ORAL_TABLET | Freq: Every day | ORAL | Status: DC

## 2014-06-07 NOTE — Assessment & Plan Note (Signed)
Failed otc med Trial of naftin cream  F/u 2 wk re check  Disc keeping feet dry

## 2014-06-07 NOTE — Progress Notes (Signed)
Pre visit review using our clinic review tool, if applicable. No additional management support is needed unless otherwise documented below in the visit note. 

## 2014-06-07 NOTE — Patient Instructions (Addendum)
Hold lisinopril hct  Instead of that take lasix 20 mg once daily  Lab today  Px for naftin for feet - cream once daily/keep your feet as dry as possible   Follow up with me in about 2 weeks

## 2014-06-07 NOTE — Assessment & Plan Note (Signed)
Pt intol of current lisinopril hct  Given edema - will change to lasix 20 and re assess at f/u  Disc DASH diet and wt loss effort also

## 2014-06-07 NOTE — Assessment & Plan Note (Signed)
Check BNP and bmp today  Change lisinopril hct to lasix 20  F/u 2 weeks supp hose while up during the day  Low sodium diet

## 2014-06-07 NOTE — Progress Notes (Signed)
Subjective:    Patient ID: [name], female    DOB: 1951-04-03, 64 y.o.   MRN: [id number]  HPI Here for several issues   Athletes foot- otc med not helping   Having a lot of swelling in feet and legs  Wears some supp hose   If she takes 2 - makes her too dizzy  So she takes one - bp is controlled not dizzy  She sleeps better if she takes none of her medicine   Swells even if she takes all the hctz   Swelling since she was here in nov - ever since going on a cruise  It is dependent  Hose help but she cannot wear those all the time   Saw cardiology- and there were no worries about her heart  Swelling was not that bad then   She still exercises -walking   Still feels like her upper body- shoulder girdle - is "very weak"- that is not getting better despite working out and trying to exercise her upper body   Weight is up 15 lb  She has not been eating a whole lot  She is working out less than she was   Patient Active Problem List   Diagnosis Date Noted  . Shortness of breath 01/14/2014  . Right arm pain 01/11/2014  . Exercise intolerance 01/11/2014  . S/P right TKA 09/28/2013  . Deformity of toenail 06/14/2013  . Other screening mammogram 12/18/2010  . Routine general medical examination at a health care facility 12/08/2010  . Osteoarthritis of knee 09/13/2010  . Knee pain, bilateral 09/11/2010  . Obesity   . Diabetes type 2, controlled 02/26/2010  . Essential hypertension 08/29/2009   Past Medical History  Diagnosis Date  . HTN (hypertension)   . Diabetes mellitus type II   . Obesity   . Arthritis    Past Surgical History  Procedure Laterality Date  . Total abdominal hysterectomy  10/1999    fibroids  . Combined abdominoplasty and liposuction  07/2000  . Total knee arthroplasty Right 09/28/2013    Procedure: RIGHT TOTAL KNEE ARTHROPLASTY;  Surgeon: Mauri Pole, MD;  Location: [facility];  Service: Orthopedics;  Laterality: Right;   History  Substance  Use Topics  . Smoking status: Never Smoker   . Smokeless tobacco: Never Used  . Alcohol Use: No   Family History  Problem Relation Age of Onset  . Arthritis Mother   . Hypertension Mother   . Cancer Mother     breast, lung  . Diabetes Mother   . Hypertension Father    No Known Allergies Current Outpatient Prescriptions on File Prior to Visit  Medication Sig Dispense Refill  . fish oil-omega-3 fatty acids 1000 MG capsule Take 1 g by mouth daily.      Marland Kitchen lisinopril-hydrochlorothiazide (PRINZIDE,ZESTORETIC) 20-12.5 MG per tablet TAKE TWO (2) TABLETS BY MOUTH DAILY 60 tablet 5  . metFORMIN (GLUCOPHAGE) 1000 MG tablet TAKE 1 TABLET BY MOUTH TWICE A DAY WITH MEAL 60 tablet 5  . Multiple Vitamin (MULTIVITAMIN) tablet Take 1 tablet by mouth daily.       No current facility-administered medications on file prior to visit.        Review of Systems    Review of Systems  Constitutional: Negative for fever, appetite change, fatigue and unexpected weight change.  Eyes: Negative for pain and visual disturbance.  Respiratory: Negative for cough and shortness of breath.   Cardiovascular: Negative for cp or palpitations  neg for PND or orthopnea , pos for pedal edema  Gastrointestinal: Negative for nausea, diarrhea and constipation.  Genitourinary: Negative for urgency and frequency.  Skin: Negative for pallor and pos for itching/ peeling rash of feet (soles) MSK pos for poor exercise tolerance of upper body Neurological: Negative for weakness, light-headedness, numbness and headaches.  Hematological: Negative for adenopathy. Does not bruise/bleed easily.  Psychiatric/Behavioral: Negative for dysphoric mood. The patient is not nervous/anxious.      Objective:   Physical Exam  Constitutional: She appears well-developed and well-nourished. No distress.  obese and well appearing   HENT:  Head: Normocephalic and atraumatic.  Mouth/Throat: Oropharynx is clear and moist.  Eyes:  Conjunctivae and EOM are normal. Pupils are equal, round, and reactive to light. No scleral icterus.  Neck: Normal range of motion. Neck supple. No JVD present. Carotid bruit is not present. No thyromegaly present.  Cardiovascular: Normal rate, regular rhythm, normal heart sounds and intact distal pulses.   Pulmonary/Chest: Effort normal and breath sounds normal. No respiratory distress. She has no wheezes. She has no rales.  Abdominal: Soft. Bowel sounds are normal. She exhibits no distension and no mass. There is no tenderness.  Musculoskeletal: She exhibits edema. She exhibits no tenderness.  One plus edema to 1/2 way up shin  Lymphadenopathy:    She has no cervical adenopathy.  Neurological: She is alert. She has normal reflexes. No cranial nerve deficit. She exhibits normal muscle tone. Coordination normal.  Skin: Skin is warm and dry. Rash noted. No pallor.  Tinea pedis noted with scale on soles of feet and maceration between toes   Psychiatric: She has a normal mood and affect.          Assessment & Plan:   Problem List Items Addressed This Visit      Cardiovascular and Mediastinum   Essential hypertension - Primary    Pt intol of current lisinopril hct  Given edema - will change to lasix 20 and re assess at f/u  Disc DASH diet and wt loss effort also        Relevant Medications   furosemide (LASIX) tablet     Musculoskeletal and Integument   Tinea pedis    Failed otc med Trial of naftin cream  F/u 2 wk re check  Disc keeping feet dry      Relevant Medications   naftifine (NAFTIN) 1 % cream     Other   Pedal edema    Check BNP and bmp today  Change lisinopril hct to lasix 20  F/u 2 weeks supp hose while up during the day  Low sodium diet        Relevant Orders   Basic metabolic panel (Completed)   Brain natriuretic peptide (Completed)

## 2014-06-08 ENCOUNTER — Encounter: Payer: Self-pay | Admitting: *Deleted

## 2014-06-15 ENCOUNTER — Telehealth: Payer: Self-pay

## 2014-06-15 NOTE — Telephone Encounter (Signed)
Try 2 lasix today and tomorrow and let me know if any improvement on Friday Thanks

## 2014-06-15 NOTE — Telephone Encounter (Signed)
Called and spoken to patient. Notified patient of Dr Marliss Coots comments. Patient verbalized understanding.

## 2014-06-15 NOTE — Telephone Encounter (Signed)
Pt was seen 06/07/14 and started Lasix 20 mg one daily; pt is wearing support hose and when sitting has feet elevated. Pt said at end of day both legs up to the thigh and buttocks is swollen. Swelling goes down overnight. No SOB. Pt has f/u appt scheduled 06/24/14.Please advise. Pt request cb. [place].

## 2014-06-21 MED ORDER — SPIRONOLACTONE 50 MG PO TABS: 50.0000 mg | ORAL_TABLET | Freq: Every day | ORAL | Status: DC

## 2014-06-21 NOTE — Telephone Encounter (Signed)
Called patient and notified her of Dr Marliss Coots comments. Patient verbalized understanding. Sent in rx to [facility] and already let patient know.

## 2014-06-21 NOTE — Telephone Encounter (Signed)
Have her stop lasix  Please call in aldactone 50 mg 1 po qd  # 30 no refills -try this daily (a diff diuretic)- if she has taken before let me know Follow up as planned

## 2014-06-21 NOTE — Telephone Encounter (Signed)
Pt called back to say that Lasix 20 mg twice daily isn't helping with swelling. She tries to keep her legs elevated especially at night when sleeping, but as soon as she awakens the swelling returns.  She says the swelling is in her abd, buttocks, both legs up to her thighs. She isn't having SOB, but c/o dryness in mouth and nose, and that causes some trouble breathing. Please advise

## 2014-06-24 ENCOUNTER — Ambulatory Visit (INDEPENDENT_AMBULATORY_CARE_PROVIDER_SITE_OTHER): Payer: Medicare Other | Admitting: Family Medicine

## 2014-06-24 ENCOUNTER — Encounter: Payer: Self-pay | Admitting: Family Medicine

## 2014-06-24 VITALS — BP 128/78 | HR 95 | Temp 97.8°F | Wt 216.5 lb

## 2014-06-24 DIAGNOSIS — I10 Essential (primary) hypertension: Secondary | ICD-10-CM

## 2014-06-24 DIAGNOSIS — R600 Localized edema: Secondary | ICD-10-CM

## 2014-06-24 DIAGNOSIS — R0602 Shortness of breath: Secondary | ICD-10-CM

## 2014-06-24 LAB — POCT URINALYSIS DIPSTICK
Bilirubin, UA: NEGATIVE
Glucose, UA: NEGATIVE
Leukocytes, UA: NEGATIVE
Nitrite, UA: NEGATIVE
Spec Grav, UA: 1.02
Urobilinogen, UA: 0.2
pH, UA: 6

## 2014-06-24 LAB — RENAL FUNCTION PANEL
Albumin: 2.1 g/dL — ABNORMAL LOW (ref 3.5–5.2)
BUN: 11 mg/dL (ref 6–23)
CO2: 32 meq/L (ref 19–32)
Calcium: 8.8 mg/dL (ref 8.4–10.5)
Chloride: 99 meq/L (ref 96–112)
Creatinine, Ser: 1.17 mg/dL (ref 0.40–1.20)
GFR: 59.59 mL/min — ABNORMAL LOW
Glucose, Bld: 129 mg/dL — ABNORMAL HIGH (ref 70–99)
Phosphorus: 3.6 mg/dL (ref 2.3–4.6)
Potassium: 3.7 meq/L (ref 3.5–5.1)
Sodium: 136 meq/L (ref 135–145)

## 2014-06-24 LAB — HEPATIC FUNCTION PANEL
ALT: 19 U/L (ref 0–35)
AST: 45 U/L — AB (ref 0–37)
Albumin: 2.1 g/dL — ABNORMAL LOW (ref 3.5–5.2)
Alkaline Phosphatase: 177 U/L — ABNORMAL HIGH (ref 39–117)
BILIRUBIN DIRECT: 0.1 mg/dL (ref 0.0–0.3)
BILIRUBIN TOTAL: 0.4 mg/dL (ref 0.2–1.2)
Total Protein: 5.3 g/dL — ABNORMAL LOW (ref 6.0–8.3)

## 2014-06-24 LAB — BRAIN NATRIURETIC PEPTIDE: Pro B Natriuretic peptide (BNP): 82 pg/mL (ref 0.0–100.0)

## 2014-06-24 NOTE — Progress Notes (Signed)
Subjective:    Patient ID: [name], female    DOB: 12/26/1950, 64 y.o.   MRN: [id number]  HPI Here with swelling  Lasix 20 and lasix 40-no improvement at all   Aldactone - 50 mg - 3 days and no improvement  Getting more swollen Has some dry spots on her arms that do not itch   Still having shortness of breath  Cannot sleep at night - trouble breathing when lying down  Results for orders placed or performed in visit on 26/37/85  Basic metabolic panel  Result Value Ref Range   Sodium 137 135 - 145 mEq/L   Potassium 3.8 3.5 - 5.1 mEq/L   Chloride 102 96 - 112 mEq/L   CO2 32 19 - 32 mEq/L   Glucose, Bld 155 (H) 70 - 99 mg/dL   BUN 15 6 - 23 mg/dL   Creatinine, Ser 1.10 0.40 - 1.20 mg/dL   Calcium 8.7 8.4 - 10.5 mg/dL   GFR 64.00 >60.00 mL/min  Brain natriuretic peptide  Result Value Ref Range   Pro B Natriuretic peptide (BNP) 105.0 (H) 0.0 - 100.0 pg/mL    Has seen the cardiologist  Did work her up - did not do an echo   Patient Active Problem List   Diagnosis Date Noted  . Pedal edema 06/07/2014  . Tinea pedis 06/07/2014  . Shortness of breath 01/14/2014  . Right arm pain 01/11/2014  . Exercise intolerance 01/11/2014  . S/P right TKA 09/28/2013  . Deformity of toenail 06/14/2013  . Other screening mammogram 12/18/2010  . Routine general medical examination at a health care facility 12/08/2010  . Osteoarthritis of knee 09/13/2010  . Knee pain, bilateral 09/11/2010  . Obesity   . Diabetes type 2, controlled 02/26/2010  . Essential hypertension 08/29/2009   Past Medical History  Diagnosis Date  . HTN (hypertension)   . Diabetes mellitus type II   . Obesity   . Arthritis    Past Surgical History  Procedure Laterality Date  . Total abdominal hysterectomy  10/1999    fibroids  . Combined abdominoplasty and liposuction  07/2000  . Total knee arthroplasty Right 09/28/2013    Procedure: RIGHT TOTAL KNEE ARTHROPLASTY;  Surgeon: Mauri Pole, MD;  Location:  [facility];  Service: Orthopedics;  Laterality: Right;   History  Substance Use Topics  . Smoking status: Never Smoker   . Smokeless tobacco: Never Used  . Alcohol Use: No   Family History  Problem Relation Age of Onset  . Arthritis Mother   . Hypertension Mother   . Cancer Mother     breast, lung  . Diabetes Mother   . Hypertension Father    No Known Allergies Current Outpatient Prescriptions on File Prior to Visit  Medication Sig Dispense Refill  . fish oil-omega-3 fatty acids 1000 MG capsule Take 1 g by mouth daily.      . metFORMIN (GLUCOPHAGE) 1000 MG tablet TAKE 1 TABLET BY MOUTH TWICE A DAY WITH MEAL 60 tablet 5  . Multiple Vitamin (MULTIVITAMIN) tablet Take 1 tablet by mouth daily.      . naftifine (NAFTIN) 1 % cream Apply topically daily. To affected areas/feet 90 g 1  . spironolactone (ALDACTONE) 50 MG tablet Take 1 tablet (50 mg total) by mouth daily. 30 tablet 0   No current facility-administered medications on file prior to visit.    Review of Systems Review of Systems  Constitutional: Negative for fever, appetite change, fatigue and  unexpected weight change.  Eyes: Negative for pain and visual disturbance.  Respiratory: Negative for cough and pos for  shortness of breath.  (exertion and lying flat), pos for pedal edema  Cardiovascular: Negative for cp or palpitations    Gastrointestinal: Negative for nausea, diarrhea and constipation.  Genitourinary: Negative for urgency and frequency.  Skin: Negative for pallor or rash   Neurological: Negative for weakness, light-headedness, numbness and headaches.  Hematological: Negative for adenopathy. Does not bruise/bleed easily.  Psychiatric/Behavioral: Negative for dysphoric mood. The patient is not nervous/anxious.         Objective:   Physical Exam  Constitutional: She appears well-developed and well-nourished. No distress.  obese and well appearing   HENT:  Head: Normocephalic and atraumatic.  Mouth/Throat:  Oropharynx is clear and moist.  Eyes: Conjunctivae and EOM are normal. Pupils are equal, round, and reactive to light. No scleral icterus.  Neck: Normal range of motion. Neck supple. No JVD present. Carotid bruit is not present. No thyromegaly present.  Cardiovascular: Normal rate, regular rhythm, normal heart sounds and intact distal pulses.  Exam reveals no gallop.   No murmur heard. Pulmonary/Chest: Effort normal and breath sounds normal. No respiratory distress. She has no wheezes. She has no rales.  No crackles   Abdominal: Soft. Bowel sounds are normal. She exhibits no distension and no abdominal bruit. There is no tenderness. There is no rebound.  Musculoskeletal: She exhibits edema. She exhibits no tenderness.  One plus pitting edema to thigh  Lymphadenopathy:    She has no cervical adenopathy.  Neurological: She is alert. She has normal reflexes. No cranial nerve deficit. She exhibits normal muscle tone. Coordination normal.  Skin: Skin is warm and dry. No rash noted. No erythema. No pallor.  Psychiatric: She has a normal mood and affect.          Assessment & Plan:   Problem List Items Addressed This Visit      Cardiovascular and Mediastinum   Essential hypertension    bp in fair control at this time  BP Readings from Last 1 Encounters:  06/24/14 128/78   No changes needed Disc lifstyle change with low sodium diet and exercise  Has been on several diuretics for swelling  ua and lab today      Relevant Orders   Renal function panel (Completed)   Hepatic function panel (Completed)   Brain natriuretic peptide (Completed)     Other   Pedal edema - Primary    Worsening despite trial of several diuretics Wt gain noted None in face and around eyes  ua today (? Pos nephrotic syndrome) Liver and kidney lab today  Check 2D echo in light of continued sob (reassuring cardiac note however) inst to avoid sodium as much as possible       Relevant Orders   Renal  function panel (Completed)   Hepatic function panel (Completed)   Brain natriuretic peptide (Completed)   2D Echocardiogram without contrast   Urinalysis Dipstick (Completed)   Shortness of breath    Pt continues to have sob on exertion and also when lying down  Also increasing pedal edema  Nl lung exam  Will re check bnp Also 2D echocardiogram       Relevant Orders   2D Echocardiogram without contrast

## 2014-06-24 NOTE — Progress Notes (Signed)
Pre visit review using our clinic review tool, if applicable. No additional management support is needed unless otherwise documented below in the visit note. 

## 2014-06-24 NOTE — Patient Instructions (Signed)
Urine and blood tests today  Stop at check out to schedule echocardiogram  Hold the aldactone for now -since it is not helping and making you dizzy

## 2014-06-24 NOTE — Assessment & Plan Note (Signed)
Pt continues to have sob on exertion and also when lying down  Also increasing pedal edema  Nl lung exam  Will re check bnp Also 2D echocardiogram

## 2014-06-26 ENCOUNTER — Emergency Department (HOSPITAL_COMMUNITY): Payer: Medicare Other

## 2014-06-26 ENCOUNTER — Observation Stay (HOSPITAL_COMMUNITY)
Admission: EM | Admit: 2014-06-26 | Discharge: 2014-06-28 | Disposition: A | Discharge status: Home / Self Care | Payer: Medicare Other | Attending: Internal Medicine | Admitting: Internal Medicine

## 2014-06-26 ENCOUNTER — Encounter (HOSPITAL_COMMUNITY): Payer: Self-pay | Admitting: Emergency Medicine

## 2014-06-26 DIAGNOSIS — E860 Dehydration: Secondary | ICD-10-CM | POA: Diagnosis present

## 2014-06-26 DIAGNOSIS — R791 Abnormal coagulation profile: Secondary | ICD-10-CM

## 2014-06-26 DIAGNOSIS — M1990 Unspecified osteoarthritis, unspecified site: Secondary | ICD-10-CM | POA: Insufficient documentation

## 2014-06-26 DIAGNOSIS — R600 Localized edema: Secondary | ICD-10-CM | POA: Diagnosis present

## 2014-06-26 DIAGNOSIS — Z96651 Presence of right artificial knee joint: Secondary | ICD-10-CM | POA: Diagnosis not present

## 2014-06-26 DIAGNOSIS — N179 Acute kidney failure, unspecified: Secondary | ICD-10-CM | POA: Diagnosis present

## 2014-06-26 DIAGNOSIS — R55 Syncope and collapse: Principal | ICD-10-CM | POA: Insufficient documentation

## 2014-06-26 DIAGNOSIS — E669 Obesity, unspecified: Secondary | ICD-10-CM | POA: Diagnosis present

## 2014-06-26 DIAGNOSIS — E119 Type 2 diabetes mellitus without complications: Secondary | ICD-10-CM | POA: Diagnosis not present

## 2014-06-26 DIAGNOSIS — E876 Hypokalemia: Secondary | ICD-10-CM | POA: Diagnosis not present

## 2014-06-26 DIAGNOSIS — I951 Orthostatic hypotension: Secondary | ICD-10-CM | POA: Diagnosis present

## 2014-06-26 DIAGNOSIS — R0600 Dyspnea, unspecified: Secondary | ICD-10-CM | POA: Diagnosis not present

## 2014-06-26 DIAGNOSIS — I10 Essential (primary) hypertension: Secondary | ICD-10-CM | POA: Diagnosis not present

## 2014-06-26 DIAGNOSIS — R7989 Other specified abnormal findings of blood chemistry: Secondary | ICD-10-CM | POA: Diagnosis present

## 2014-06-26 LAB — BRAIN NATRIURETIC PEPTIDE: B NATRIURETIC PEPTIDE 5: 79.5 pg/mL (ref 0.0–100.0)

## 2014-06-26 LAB — I-STAT TROPONIN, ED: Troponin i, poc: 0.05 ng/mL (ref 0.00–0.08)

## 2014-06-26 LAB — BASIC METABOLIC PANEL
ANION GAP: 8 (ref 5–15)
BUN: 11 mg/dL (ref 6–23)
CALCIUM: 8.8 mg/dL (ref 8.4–10.5)
CO2: 30 mmol/L (ref 19–32)
CREATININE: 1.29 mg/dL — AB (ref 0.50–1.10)
Chloride: 98 mmol/L (ref 96–112)
GFR calc Af Amer: 49 mL/min — ABNORMAL LOW (ref >90)
GFR calc non Af Amer: 42 mL/min — ABNORMAL LOW (ref >90)
GLUCOSE: 125 mg/dL — AB (ref 70–99)
Potassium: 3.1 mmol/L — ABNORMAL LOW (ref 3.5–5.1)
Sodium: 136 mmol/L (ref 135–145)

## 2014-06-26 LAB — CBC
HEMATOCRIT: 38.3 % (ref 36.0–46.0)
Hemoglobin: 13 g/dL (ref 12.0–15.0)
MCH: 28.4 pg (ref 26.0–34.0)
MCHC: 33.9 g/dL (ref 30.0–36.0)
MCV: 83.6 fL (ref 78.0–100.0)
PLATELETS: 425 10*3/uL — AB (ref 150–400)
RBC: 4.58 MIL/uL (ref 3.87–5.11)
RDW: 17.5 % — AB (ref 11.5–15.5)
WBC: 7.1 10*3/uL (ref 4.0–10.5)

## 2014-06-26 LAB — GLUCOSE, CAPILLARY
Glucose-Capillary: 81 mg/dL (ref 70–99)
Glucose-Capillary: 99 mg/dL (ref 70–99)

## 2014-06-26 LAB — D-DIMER, QUANTITATIVE (NOT AT ARMC): D DIMER QUANT: 2.12 ug{FEU}/mL — AB (ref 0.00–0.48)

## 2014-06-26 MED ORDER — SODIUM CHLORIDE 0.9 % IV SOLN: INTRAVENOUS | Status: DC

## 2014-06-26 MED ORDER — ALUM & MAG HYDROXIDE-SIMETH 200-200-20 MG/5ML PO SUSP: 30.0000 mL | Freq: Four times a day (QID) | ORAL | Status: DC | PRN

## 2014-06-26 MED ORDER — ONDANSETRON HCL 4 MG PO TABS: 4.0000 mg | ORAL_TABLET | Freq: Four times a day (QID) | ORAL | Status: DC | PRN

## 2014-06-26 MED ORDER — ONDANSETRON HCL 4 MG/2ML IJ SOLN: 4.0000 mg | Freq: Four times a day (QID) | INTRAMUSCULAR | Status: DC | PRN

## 2014-06-26 MED ORDER — INSULIN ASPART 100 UNIT/ML ~~LOC~~ SOLN: 0.0000 [IU] | Freq: Three times a day (TID) | SUBCUTANEOUS | Status: DC

## 2014-06-26 MED ORDER — ACETAMINOPHEN 650 MG RE SUPP: 650.0000 mg | Freq: Four times a day (QID) | RECTAL | Status: DC | PRN

## 2014-06-26 MED ORDER — SODIUM CHLORIDE 0.9 % IV SOLN
INTRAVENOUS | Status: DC
  Administered 2014-06-27: 01:00:00 via INTRAVENOUS

## 2014-06-26 MED ORDER — ACETAMINOPHEN 325 MG PO TABS: 650.0000 mg | ORAL_TABLET | Freq: Four times a day (QID) | ORAL | Status: DC | PRN

## 2014-06-26 MED ORDER — MORPHINE SULFATE 2 MG/ML IJ SOLN: 1.0000 mg | INTRAMUSCULAR | Status: DC | PRN

## 2014-06-26 MED ORDER — TECHNETIUM TC 99M DIETHYLENETRIAME-PENTAACETIC ACID: 40.0000 | Freq: Once | INTRAVENOUS | Status: AC | PRN

## 2014-06-26 MED ORDER — HEPARIN SODIUM (PORCINE) 5000 UNIT/ML IJ SOLN
5000.0000 [IU] | Freq: Three times a day (TID) | INTRAMUSCULAR | Status: DC
  Administered 2014-06-26 – 2014-06-28 (×5): 5000 [IU] via SUBCUTANEOUS
  Filled 2014-06-26 (×8): qty 1

## 2014-06-26 MED ORDER — POTASSIUM CHLORIDE 10 MEQ/100ML IV SOLN
10.0000 meq | Freq: Once | INTRAVENOUS | Status: AC
  Administered 2014-06-26: 10 meq via INTRAVENOUS
  Filled 2014-06-26: qty 100

## 2014-06-26 MED ORDER — POTASSIUM CHLORIDE CRYS ER 20 MEQ PO TBCR
40.0000 meq | EXTENDED_RELEASE_TABLET | Freq: Once | ORAL | Status: AC
  Administered 2014-06-26: 40 meq via ORAL
  Filled 2014-06-26: qty 2

## 2014-06-26 MED ORDER — TECHNETIUM TO 99M ALBUMIN AGGREGATED
6.0000 | Freq: Once | INTRAVENOUS | Status: AC | PRN
  Administered 2014-06-26: 6 via INTRAVENOUS

## 2014-06-26 MED ORDER — INSULIN ASPART 100 UNIT/ML ~~LOC~~ SOLN: 0.0000 [IU] | Freq: Every day | SUBCUTANEOUS | Status: DC

## 2014-06-26 MED ORDER — SODIUM CHLORIDE 0.9 % IV SOLN
INTRAVENOUS | Status: DC
  Administered 2014-06-26 (×2): 75 mL/h via INTRAVENOUS
  Administered 2014-06-27: 02:00:00 via INTRAVENOUS

## 2014-06-26 MED ORDER — OXYCODONE HCL 5 MG PO TABS: 5.0000 mg | ORAL_TABLET | ORAL | Status: DC | PRN

## 2014-06-26 NOTE — ED Provider Notes (Signed)
CSN: [id number]     Arrival date & time 06/26/14  1232 History   First MD Initiated Contact with Patient 06/26/14 1240     Chief Complaint  Patient presents with  . Near Syncope  . Shortness of Breath     HPI Pt was seen at 1255. Per pt, c/o gradual onset and worsening of persistent bilat LE's edema for the past 1 to 2 months. Pt states she has been on multiple diuretics without change in her edema. Pt states she stopped her diuretics 3 days ago, but then developed "dizziness," SOB (esp on exertion) and generalized weakness. Pt describes her dizziness as near syncope and "everything just does black and I have to sit down before I pass out." Pt denies syncope. Denies CP/palpitations, no cough, no abd pain, no N/V/D, no back pain, no calf/LE pain or unilateral swelling.    Past Medical History  Diagnosis Date  . HTN (hypertension)   . Diabetes mellitus type II   . Obesity   . Arthritis    Past Surgical History  Procedure Laterality Date  . Total abdominal hysterectomy  10/1999    fibroids  . Combined abdominoplasty and liposuction  07/2000  . Total knee arthroplasty Right 09/28/2013    Procedure: RIGHT TOTAL KNEE ARTHROPLASTY;  Surgeon: Mauri Pole, MD;  Location: [facility];  Service: Orthopedics;  Laterality: Right;   Family History  Problem Relation Age of Onset  . Arthritis Mother   . Hypertension Mother   . Cancer Mother     breast, lung  . Diabetes Mother   . Hypertension Father    History  Substance Use Topics  . Smoking status: Never Smoker   . Smokeless tobacco: Never Used  . Alcohol Use: No    Review of Systems ROS: Statement: All systems negative except as marked or noted in the HPI; Constitutional: Negative for fever and chills. +generalized weakness.; ; Eyes: Negative for eye pain, redness and discharge. ; ; ENMT: Negative for ear pain, hoarseness, nasal congestion, sinus pressure and sore throat. ; ; Cardiovascular: Negative for chest pain, palpitations,  diaphoresis, +dyspnea and peripheral edema. ; ; Respiratory: Negative for cough, wheezing and stridor. ; ; Gastrointestinal: Negative for nausea, vomiting, diarrhea, abdominal pain, blood in stool, hematemesis, jaundice and rectal bleeding. . ; ; Genitourinary: Negative for dysuria, flank pain and hematuria. ; ; Musculoskeletal: Negative for back pain and neck pain. Negative for swelling and trauma.; ; Skin: Negative for pruritus, rash, abrasions, blisters, bruising and skin lesion.; ; Neuro: +near syncope. Negative for headache, lightheadedness and neck stiffness. Negative for altered level of consciousness , altered mental status, extremity weakness, paresthesias, involuntary movement, seizure and syncope.     Allergies  Review of patient's allergies indicates no known allergies.  Home Medications   Prior to Admission medications   Medication Sig Start Date End Date Taking? Authorizing Provider  metFORMIN (GLUCOPHAGE) 1000 MG tablet TAKE 1 TABLET BY MOUTH TWICE A DAY WITH MEAL 12/14/13  Yes Abner Greenspan, MD  naftifine (NAFTIN) 1 % cream Apply topically daily. To affected areas/feet 06/07/14  Yes Abner Greenspan, MD  pseudoephedrine-guaifenesin [facility] D) 60-600 MG per tablet Take 1 tablet by mouth every 12 (twelve) hours as needed for congestion.   Yes Historical Provider, MD  lisinopril-hydrochlorothiazide (PRINZIDE,ZESTORETIC) 20-12.5 MG per tablet Take 2 tablets by mouth daily.    Historical Provider, MD  spironolactone (ALDACTONE) 50 MG tablet Take 1 tablet (50 mg total) by mouth daily. 06/21/14  Abner Greenspan, MD   BP 107/63 mmHg  Pulse 90  Temp(Src) 97.9 F (36.6 C) (Oral)  Resp 17  Ht 5\' 1"  (1.549 m)  Wt 216 lb (97.977 kg)  BMI 40.83 kg/m2  SpO2 96% Physical Exam  1300: Physical examination:  Nursing notes reviewed; Vital signs and O2 SAT reviewed;  Constitutional: Well developed, Well nourished, Well hydrated, In no acute distress; Head:  Normocephalic, atraumatic; Eyes: EOMI,  PERRL, No scleral icterus; ENMT: Mouth and pharynx normal, Mucous membranes moist; Neck: Supple, Full range of motion, No lymphadenopathy; Cardiovascular: Regular rate and rhythm, No gallop; Respiratory: Breath sounds clear & equal bilaterally, No wheezes.  Speaking full sentences with ease, Normal respiratory effort/excursion; Chest: Nontender, Movement normal; Abdomen: Soft, Nontender, Nondistended, Normal bowel sounds; Genitourinary: No CVA tenderness; Extremities: Pulses normal, No tenderness, +3 pedal edema bilat, No calf asymmetry.; Neuro: AA&Ox3, Major CN grossly intact.  Speech clear. No gross focal motor or sensory deficits in extremities.; Skin: Color normal, Warm, Dry.   ED Course  Procedures     EKG Interpretation   Date/Time:  Sunday June 26 2014 12:35:40 EST Ventricular Rate:  94 PR Interval:  158 QRS Duration: 73 QT Interval:  431 QTC Calculation: 539 R Axis:   -29 Text Interpretation:  Sinus rhythm Left axis deviation Inferior infarct,  old Anterior infarct, old Prolonged QT interval Low voltage QRS Baseline  wander No old tracing to compare Confirmed by [facility]  MD, Nunzio Cory  [phone]) on 06/26/2014 1:30:41 PM      MDM  MDM Reviewed: previous chart, nursing note and vitals Reviewed previous: labs Interpretation: labs, ECG and x-ray     Results for orders placed or performed during the hospital encounter of 67/67/20  Basic metabolic panel    (if pt has PMH of COPD)  Result Value Ref Range   Sodium 136 135 - 145 mmol/L   Potassium 3.1 (L) 3.5 - 5.1 mmol/L   Chloride 98 96 - 112 mmol/L   CO2 30 19 - 32 mmol/L   Glucose, Bld 125 (H) 70 - 99 mg/dL   BUN 11 6 - 23 mg/dL   Creatinine, Ser 1.29 (H) 0.50 - 1.10 mg/dL   Calcium 8.8 8.4 - 10.5 mg/dL   GFR calc non Af Amer 42 (L) >90 mL/min   GFR calc Af Amer 49 (L) >90 mL/min   Anion gap 8 5 - 15  CBC     (if pt has PMH of COPD)  Result Value Ref Range   WBC 7.1 4.0 - 10.5 K/uL   RBC 4.58 3.87 - 5.11 MIL/uL    Hemoglobin 13.0 12.0 - 15.0 g/dL   HCT 38.3 36.0 - 46.0 %   MCV 83.6 78.0 - 100.0 fL   MCH 28.4 26.0 - 34.0 pg   MCHC 33.9 30.0 - 36.0 g/dL   RDW 17.5 (H) 11.5 - 15.5 %   Platelets 425 (H) 150 - 400 K/uL  Brain natriuretic peptide  Result Value Ref Range   B Natriuretic Peptide 79.5 0.0 - 100.0 pg/mL  D-dimer, quantitative  Result Value Ref Range   D-Dimer, Quant 2.12 (H) 0.00 - 0.48 ug/mL-FEU  I-stat troponin, ED (if patient has history of COPD)  Result Value Ref Range   Troponin i, poc 0.05 0.00 - 0.08 ng/mL   Comment 3           Dg Chest 2 View (if Patient Has Fever And/or Copd) 06/26/2014   CLINICAL DATA:  Dizziness, bilateral leg swelling  EXAM: CHEST  2 VIEW  COMPARISON:  09/13/2013  FINDINGS: Cardiomediastinal silhouette is stable. No acute infiltrate or pleural effusion. No pulmonary edema. Mild degenerative changes thoracic spine.  IMPRESSION: No active cardiopulmonary disease.   Electronically Signed   By: Lahoma Crocker M.D.   On: 06/26/2014 13:51     1530:   BUN/Cr elevated from baseline and pt is orthostatic on VS; will dose judicious IVF. Potassium repleted IV.  D-dimer elevated; V/Q scan pending. Dx and testing d/w pt.  Questions answered.  Verb understanding, agreeable to observation admit. T/C to [facility] Eb[facility] Hail, case discussed, including:  HPI, pertinent PM/SHx, VS/PE, dx testing, ED course and treatment:  Agreeable to admit, requests to write temporary orders, obtain tele observation bed to team [facility].   Francine Graven, DO 06/28/14 [phone]

## 2014-06-26 NOTE — ED Notes (Signed)
Onset 06/07/14 placed on a diuretic stated did not help. Changed medication 6 days ago and that medication also did not help.  Spoke with Doctor and stop taking medication 3 days ago. During this time last week developed dizziness, shortness of breath on exertion, and general weakness.

## 2014-06-26 NOTE — ED Notes (Signed)
States edema lower extremities started approximately 2 months ago.

## 2014-06-26 NOTE — Assessment & Plan Note (Signed)
bp in fair control at this time  BP Readings from Last 1 Encounters:  06/24/14 128/78   No changes needed Disc lifstyle change with low sodium diet and exercise  Has been on several diuretics for swelling  ua and lab today

## 2014-06-26 NOTE — ED Notes (Signed)
NM called to notify that medications for pt are on their way for administration for Pulmonary scan.

## 2014-06-26 NOTE — ED Notes (Signed)
Nuclear Medicine called [phone] states will perform test in approximately 2 hours. EDP notified.

## 2014-06-26 NOTE — Assessment & Plan Note (Signed)
Worsening despite trial of several diuretics Wt gain noted None in face and around eyes  ua today (? Pos nephrotic syndrome) Liver and kidney lab today  Check 2D echo in light of continued sob (reassuring cardiac note however) inst to avoid sodium as much as possible

## 2014-06-26 NOTE — H&P (Signed)
[facility] History and Physical                                                                                    [name], is a 64 y.o. female  MRN: [id number]   DOB - 1950-08-11  Admit Date - 06/26/2014  Outpatient Primary MD for the patient is [name], MD  With History of -  Past Medical History  Diagnosis Date  . HTN (hypertension)   . Diabetes mellitus type II   . Obesity   . Arthritis       Past Surgical History  Procedure Laterality Date  . Total abdominal hysterectomy  10/1999    fibroids  . Combined abdominoplasty and liposuction  07/2000  . Total knee arthroplasty Right 09/28/2013    Procedure: RIGHT TOTAL KNEE ARTHROPLASTY;  Surgeon: Mauri Pole, MD;  Location: [facility];  Service: Orthopedics;  Laterality: Right;    in for   Chief Complaint  Patient presents with  . Near Syncope  . Shortness of Breath     HPI [name]  is a 64 y.o. female, past medical history of diabetes, hypertension, obesity and arthritis. She also reports a history of bilateral lower extremity edema ongoing since November 2015. According to her problem list in Epic she has had issues with exercise intolerance and pedal edema in the past as well. She presents to the ER with complaints of lower extremity edema that is not responding to diuretic therapy as well as significant dizziness especially with standing that has been ongoing for several days. According to the patient her primary care physician has been making multiple adjustments in her medications to see if they can improve the edema and an outpatient echocardiogram has been planned. She previously was on Prinivil hydrochlorothiazide which was discontinued on February 4 in favor of Lasix. Her initial dose of Lasix was 40 mg daily and when she was not responding to that with appropriate reduction in edema the dosage was increased to 80 mg daily. Patient reports she was on the Lasix for 11 days. This was discontinued in favor  of Aldactone. By Thursday 2/18 because of ongoing dizziness and weakness primary care physician also stopped the Aldactone. Patient reports she adheres to a diet of mainly vegetables and fish. She also reports she drinks at least 32 ounces of water a day attempts to "flush out" her system. She denies orthopnea or dyspnea on exertion but says after she walks when she stands for a while she gets dizzy and feels very weak. She also endorses nonspecific symptoms of randomly occurring nausea with lower abdominal cramping.  In the ER she was mildly orthostatic, she was non-hypoxic on room air. Her creatinine had slightly increased to 1.29 from a baseline of around 1.10. Her troponin was normal at 0.05. Her BNP was 79.5. Her d-dimer though was elevated at 2.12 in the ER physician and has planned a nuclear medicine VQ scan to rule out PE. Important to note patient is not hypoxic and denies any chest pain or shortness of breath.  Review of Systems   In addition to the HPI above,  No Fever-chills, myalgias or  other constitutional symptoms No Headache, changes with Vision or hearing, new weakness, tingling, numbness in any extremity, No problems swallowing food or Liquids, indigestion/reflux No Chest pain, palpitations, orthopnea or DOE No emesis; no melena or hematochezia, no dark tarry stools, Bowel movements are regular, No dysuria, hematuria or flank pain No new skin rashes, lesions, masses or bruises, No new joints pains-aches No recent weight  loss No polyuria, polydypsia or polyphagia,  *A full 10 point Review of Systems was done, except as stated above, all other Review of Systems were negative.  Social History History  Substance Use Topics  . Smoking status: Never Smoker   . Smokeless tobacco: Never Used  . Alcohol Use: No    Family History Family History  Problem Relation Age of Onset  . Arthritis Mother   . Hypertension Mother   . Cancer Mother     breast, lung  . Diabetes Mother     . Hypertension Father     Prior to Admission medications   Medication Sig Start Date End Date Taking? Authorizing Provider  metFORMIN (GLUCOPHAGE) 1000 MG tablet TAKE 1 TABLET BY MOUTH TWICE A DAY WITH MEAL 12/14/13  Yes Abner Greenspan, MD  naftifine (NAFTIN) 1 % cream Apply topically daily. To affected areas/feet 06/07/14  Yes Abner Greenspan, MD  pseudoephedrine-guaifenesin [facility] D) 60-600 MG per tablet Take 1 tablet by mouth every 12 (twelve) hours as needed for congestion.   Yes Historical Provider, MD  lisinopril-hydrochlorothiazide (PRINZIDE,ZESTORETIC) 20-12.5 MG per tablet Take 2 tablets by mouth daily.    Historical Provider, MD  spironolactone (ALDACTONE) 50 MG tablet Take 1 tablet (50 mg total) by mouth daily. 06/21/14   Abner Greenspan, MD    No Known Allergies  Physical Exam  Vitals  Blood pressure 107/63, pulse 90, temperature 97.9 F (36.6 C), temperature source Oral, resp. rate 17, height 5\' 1"  (1.549 m), weight 216 lb (97.977 kg), SpO2 96 %.   General:  In no acute distress, appears healthy and well nourished  Psych:  Normal affect, Denies Suicidal or Homicidal ideations, Awake Alert, Oriented X 3. Speech and thought patterns are clear and appropriate, no apparent short term memory deficits  Neuro:   No focal neurological deficits, CN II through XII intact, Strength 5/5 all 4 extremities, Sensation intact all 4 extremities.  ENT:  Ears and Eyes appear Normal, Conjunctivae clear, PER. Moist oral mucosa without erythema or exudates.  Neck:  Supple, No lymphadenopathy appreciated  Respiratory:  Symmetrical chest wall movement, Good air movement bilaterally, CTAB. Room Air  Cardiac:  RRR, No Murmurs and no S3, 1-2+ bilateral LE edema noted and also feet with the feet very puffy and the toes are spared, no JVD, No carotid bruits, peripheral pulses palpable at 2+  Abdomen:  Positive bowel sounds, Soft, Non tender, Non distended,  No masses appreciated, no obvious  hepatosplenomegaly  Skin:  No Cyanosis, Normal Skin Turgor, No Skin Rash or Bruise.  Extremities: Symmetrical without obvious trauma or injury,  no effusions.  Data Review  CBC  Recent Labs Lab 06/26/14 1256  WBC 7.1  HGB 13.0  HCT 38.3  PLT 425*  MCV 83.6  MCH 28.4  MCHC 33.9  RDW 17.5*    Chemistries   Recent Labs Lab 06/24/14 0957 06/26/14 1256  NA 136 136  K 3.7 3.1*  CL 99 98  CO2 32 30  GLUCOSE 129* 125*  BUN 11 11  CREATININE 1.17 1.29*  CALCIUM 8.8 8.8  AST  45*  --   ALT 19  --   ALKPHOS 177*  --   BILITOT 0.4  --     estimated creatinine clearance is 46.6 mL/min (by C-G formula based on Cr of 1.29).  No results for input(s): TSH, T4TOTAL, T3FREE, THYROIDAB in the last 72 hours.  Invalid input(s): FREET3  Coagulation profile No results for input(s): INR, PROTIME in the last 168 hours.   Recent Labs  06/26/14 1256  DDIMER 2.12*    Cardiac Enzymes No results for input(s): CKMB, TROPONINI, MYOGLOBIN in the last 168 hours.  Invalid input(s): CK  Invalid input(s): POCBNP  Urinalysis    Component Value Date/Time   COLORURINE YELLOW 09/20/2013 [place] 09/20/2013 0857   LABSPEC 1.025 09/20/2013 0857   PHURINE 5.5 09/20/2013 [place] 09/20/2013 0857   HGBUR NEGATIVE 09/20/2013 0857   BILIRUBINUR neg 06/24/2014 0959   BILIRUBINUR NEGATIVE 09/20/2013 0857   KETONESUR NEGATIVE 09/20/2013 0857   PROTEINUR 3+ 06/24/2014 0959   PROTEINUR NEGATIVE 09/20/2013 0857   UROBILINOGEN 0.2 06/24/2014 0959   UROBILINOGEN 0.2 09/20/2013 0857   NITRITE neg 06/24/2014 0959   NITRITE NEGATIVE 09/20/2013 0857   LEUKOCYTESUR Negative 06/24/2014 0959    Imaging results:   Dg Chest 2 View (if Patient Has Fever And/or Copd)  06/26/2014   CLINICAL DATA:  Dizziness, bilateral leg swelling  EXAM: CHEST  2 VIEW  COMPARISON:  09/13/2013  FINDINGS: Cardiomediastinal silhouette is stable. No acute infiltrate or pleural  effusion. No pulmonary edema. Mild degenerative changes thoracic spine.  IMPRESSION: No active cardiopulmonary disease.   Electronically Signed   By: Lahoma Crocker M.D.   On: 06/26/2014 13:51     EKG: Sinus rhythm QTC 539 ms   Assessment & Plan  Active Problems:   Acute renal failure -Admit to medical floor; observation status -Seems directly related to overdiuresis so we'll discontinue all diuretics -We'll also hold metformin in favor of sliding scale insulin    Dehydration/Orthostatic hypotension -See above -Ck orthostatic vital signs in a.m. -IV fluids at 75 mL per hour -BMET in am    Essential hypertension -Blood pressure actually soft and likely related to volume depletion -Holding home antihypertensives and diuretics    Positive D dimer -Patient does not have any hypoxemia or chest pain so suspect elevated d-dimer directly related to abnormal renal function -Follow up on previously ordered VQ scan    Bilateral lower extremity edema -Has been chronic and ongoing since November 2015 -Check bilateral lower extremity duplex to rule out underlying DVT    Diabetes type 2, controlled -Metformin on hold secondary to acute renal failure -Provide sliding scale insulin    Obesity -Primary care physician already addressing weight reduction strategies    Hypokalemia -Oral replete    DVT Prophylaxis: Subcutaneous heparin  Family Communication:  No family at bedside   Code Status:  Full code  Condition: Stable   Time spent in minutes : 60   ELLIS,ALLISON L. ANP on 06/26/2014 at 3:49 PM  Between 7am to 7pm - Pager - [phone]  After 7pm go to [email or web address] - password [email or web address]  And look for the night coverage person covering me after hours  [facility]

## 2014-06-26 NOTE — Progress Notes (Signed)
LIANA CAMERER [id number] Admission Data: 06/26/2014 6:23 PM Attending Provider: Verlee Monte, MD  OMV:[facility] Tower, MD Consults/ Treatment Team:    ANDORA KRULL is a 64 y.o. female patient admitted from ED awake, alert  & orientated  X 3,  Full Code, VSS - Blood pressure 132/79, pulse 90, temperature 98.3 F (36.8 C), temperature source Oral, resp. rate 16, height 5\' 1"  (1.549 m), weight 99.338 kg (219 lb), SpO2 100 %.,  no c/o shortness of breath, no c/o chest pain, no distress noted. Tele # 16 placed.    IV site WDL:  Right A/C running NS.  Allergies:  No Known Allergies   Past Medical History  Diagnosis Date  . HTN (hypertension)   . Diabetes mellitus type II   . Obesity   . Arthritis     Pt orientation to unit, room and routine. Information packet given to patient/family and safety video watched.  Admission INP armband ID verified with patient/family, and in place. SR up x 2, fall risk assessment complete with Patient and family verbalizing understanding of risks associated with falls. Pt verbalizes an understanding of how to use the call bell and to call for help before getting out of bed.  Skin, clean-dry- intact without evidence of bruising, or skin tears.   No evidence of skin break down noted on exam.    Will cont to monitor and assist as needed.  Dayle Points, RN 06/26/2014 6:23 PM

## 2014-06-26 NOTE — ED Notes (Signed)
Patient ambulated in ED hallway steady gait and without incident denies shortness of breath or dizziness at this time. HR 103, RR 20, and pulse oximetry 97% RA.

## 2014-06-27 ENCOUNTER — Other Ambulatory Visit: Payer: Self-pay | Admitting: Radiology

## 2014-06-27 DIAGNOSIS — R0600 Dyspnea, unspecified: Secondary | ICD-10-CM

## 2014-06-27 DIAGNOSIS — R600 Localized edema: Secondary | ICD-10-CM

## 2014-06-27 DIAGNOSIS — I951 Orthostatic hypotension: Secondary | ICD-10-CM

## 2014-06-27 DIAGNOSIS — R55 Syncope and collapse: Principal | ICD-10-CM

## 2014-06-27 DIAGNOSIS — R0602 Shortness of breath: Secondary | ICD-10-CM

## 2014-06-27 LAB — URINALYSIS, ROUTINE W REFLEX MICROSCOPIC
GLUCOSE, UA: NEGATIVE mg/dL
KETONES UR: 15 mg/dL — AB
LEUKOCYTES UA: NEGATIVE
Nitrite: NEGATIVE
Specific Gravity, Urine: 1.018 (ref 1.005–1.030)
Urobilinogen, UA: 1 mg/dL (ref 0.0–1.0)
pH: 5.5 (ref 5.0–8.0)

## 2014-06-27 LAB — HEPATITIS B SURFACE ANTIGEN: HEP B S AG: NEGATIVE

## 2014-06-27 LAB — BASIC METABOLIC PANEL
ANION GAP: 7 (ref 5–15)
BUN: 11 mg/dL (ref 6–23)
CHLORIDE: 103 mmol/L (ref 96–112)
CO2: 26 mmol/L (ref 19–32)
Calcium: 8.3 mg/dL — ABNORMAL LOW (ref 8.4–10.5)
Creatinine, Ser: 1.2 mg/dL — ABNORMAL HIGH (ref 0.50–1.10)
GFR calc non Af Amer: 46 mL/min — ABNORMAL LOW (ref >90)
GFR, EST AFRICAN AMERICAN: 54 mL/min — AB (ref >90)
Glucose, Bld: 96 mg/dL (ref 70–99)
Potassium: 3.8 mmol/L (ref 3.5–5.1)
SODIUM: 136 mmol/L (ref 135–145)

## 2014-06-27 LAB — URINE MICROSCOPIC-ADD ON

## 2014-06-27 LAB — GLUCOSE, CAPILLARY
GLUCOSE-CAPILLARY: 99 mg/dL (ref 70–99)
GLUCOSE-CAPILLARY: 83 mg/dL (ref 70–99)
Glucose-Capillary: 91 mg/dL (ref 70–99)
Glucose-Capillary: 110 mg/dL — ABNORMAL HIGH (ref 70–99)

## 2014-06-27 LAB — PROTEIN / CREATININE RATIO, URINE
CREATININE, URINE: 135.19 mg/dL
Protein Creatinine Ratio: 8.5 — ABNORMAL HIGH (ref 0.00–0.15)
Total Protein, Urine: 1149 mg/dL

## 2014-06-27 LAB — HEPATITIS C ANTIBODY: HCV AB: NEGATIVE

## 2014-06-27 MED ORDER — SODIUM CHLORIDE 0.9 % IV BOLUS (SEPSIS)
500.0000 mL | Freq: Once | INTRAVENOUS | Status: AC
  Administered 2014-06-27: 500 mL via INTRAVENOUS

## 2014-06-27 MED ORDER — FUROSEMIDE 40 MG PO TABS: 40.0000 mg | ORAL_TABLET | Freq: Every day | ORAL | Status: DC

## 2014-06-27 MED ORDER — POTASSIUM CHLORIDE CRYS ER 20 MEQ PO TBCR
20.0000 meq | EXTENDED_RELEASE_TABLET | Freq: Every day | ORAL | Status: DC
  Administered 2014-06-27 – 2014-06-28 (×2): 20 meq via ORAL
  Filled 2014-06-27 (×2): qty 1

## 2014-06-27 MED ORDER — POTASSIUM CHLORIDE CRYS ER 20 MEQ PO TBCR: 20.0000 meq | EXTENDED_RELEASE_TABLET | Freq: Every day | ORAL | Status: DC

## 2014-06-27 MED ORDER — FUROSEMIDE 40 MG PO TABS
40.0000 mg | ORAL_TABLET | Freq: Every day | ORAL | Status: DC
  Administered 2014-06-27 – 2014-06-28 (×2): 40 mg via ORAL
  Filled 2014-06-27 (×2): qty 1

## 2014-06-27 NOTE — Progress Notes (Signed)
UR completed 

## 2014-06-27 NOTE — Discharge Summary (Signed)
PATIENT DETAILS Name: [name] Age: 64 y.o. Sex: female Date of Birth: [birth year] MRN: [id number]. Admitting Physician: Verlee Monte, MD ZDG:[facility] Tower, MD  Admit Date: 06/26/2014 Discharge date: 06/28/2014  Recommendations for Outpatient Follow-up:  Suspected Nephrotic Syndrome from DM or Obesity (FSGS)-please refer to Nephrology for further work up HIV/ANA serology pending-please follow Temporarily discontinued ACEI-as BP only able to tolerate Lasix-please reassess at next visit  PRIMARY DISCHARGE DIAGNOSIS:  Active Problems:   Diabetes type 2, controlled   Essential hypertension   Obesity   Acute renal failure   Dehydration   Orthostatic hypotension   Positive D dimer   Bilateral lower extremity edema   Hypokalemia   Dyspnea      PAST MEDICAL HISTORY: Past Medical History  Diagnosis Date  . HTN (hypertension)   . Diabetes mellitus type II   . Obesity   . Arthritis     DISCHARGE MEDICATIONS: Current Discharge Medication List    START taking these medications   Details  furosemide (LASIX) 40 MG tablet Take 1 tablet (40 mg total) by mouth daily. Qty: 30 tablet, Refills: 0    potassium chloride SA (K-DUR,KLOR-CON) 20 MEQ tablet Take 1 tablet (20 mEq total) by mouth daily. Qty: 30 tablet, Refills: 0      CONTINUE these medications which have NOT CHANGED   Details  metFORMIN (GLUCOPHAGE) 1000 MG tablet TAKE 1 TABLET BY MOUTH TWICE A DAY WITH MEAL Qty: 60 tablet, Refills: 5    naftifine (NAFTIN) 1 % cream Apply topically daily. To affected areas/feet Qty: 90 g, Refills: 1    pseudoephedrine-guaifenesin (MUCINEX D) 60-600 MG per tablet Take 1 tablet by mouth every 12 (twelve) hours as needed for congestion.      STOP taking these medications     lisinopril-hydrochlorothiazide (PRINZIDE,ZESTORETIC) 20-12.5 MG per tablet      spironolactone (ALDACTONE) 50 MG tablet         ALLERGIES:  No Known Allergies  BRIEF HPI:  See H&P, Labs, Consult  and Test reports for all details in brief, patient was admitted for evaluation of pre-syncope and lower ext edema  CONSULTATIONS:   None  PERTINENT RADIOLOGIC STUDIES: Dg Chest 2 View (if Patient Has Fever And/or Copd)  06/26/2014   CLINICAL DATA:  Dizziness, bilateral leg swelling  EXAM: CHEST  2 VIEW  COMPARISON:  09/13/2013  FINDINGS: Cardiomediastinal silhouette is stable. No acute infiltrate or pleural effusion. No pulmonary edema. Mild degenerative changes thoracic spine.  IMPRESSION: No active cardiopulmonary disease.   Electronically Signed   By: Lahoma Crocker M.D.   On: 06/26/2014 13:51   Nm Pulmonary Perf And Vent  06/26/2014   CLINICAL DATA:  Bilateral lower extremity swelling, shortness of breath and elevated D-dimer  EXAM: NUCLEAR MEDICINE VENTILATION - PERFUSION LUNG SCAN  TECHNIQUE: Ventilation images were obtained in multiple projections using inhaled aerosol technetium 99 M DTPA. Perfusion images were obtained in multiple projections after intravenous injection of Tc-55m MAA.  RADIOPHARMACEUTICALS:  40.0 mCi Tc-51m DTPA aerosol and 6.0 mCi Tc-70m MAA  COMPARISON:  Chest x-ray same date.  FINDINGS: Ventilation: The ventilation scan is nondiagnostic. Deep patient could not perform the ventilation part.  Perfusion: No segmental or subsegmental perfusion defects to suggest pulmonary embolism.  IMPRESSION: Negative perfusion lung scan.  No evidence of pulmonary embolism.   Electronically Signed   By: Marijo Sanes M.D.   On: 06/26/2014 17:25     PERTINENT LAB RESULTS: CBC:  Recent Labs  06/26/14 1256  WBC 7.1  HGB 13.0  HCT 38.3  PLT 425*   CMET CMP     Component Value Date/Time   NA 137 06/28/2014 0520   K 3.8 06/28/2014 0520   CL 103 06/28/2014 0520   CO2 31 06/28/2014 0520   GLUCOSE 101* 06/28/2014 0520   BUN 14 06/28/2014 0520   CREATININE 1.17* 06/28/2014 0520   CALCIUM 8.5 06/28/2014 0520   PROT 5.3* 06/24/2014 0957   ALBUMIN 2.1* 06/24/2014 0957   ALBUMIN 2.1*  06/24/2014 0957   AST 45* 06/24/2014 0957   ALT 19 06/24/2014 0957   ALKPHOS 177* 06/24/2014 0957   BILITOT 0.4 06/24/2014 0957   GFRNONAA 48* 06/28/2014 0520   GFRAA 55* 06/28/2014 0520    GFR Estimated Creatinine Clearance: 51.9 mL/min (by C-G formula based on Cr of 1.17). No results for input(s): LIPASE, AMYLASE in the last 72 hours. No results for input(s): CKTOTAL, CKMB, CKMBINDEX, TROPONINI in the last 72 hours. Invalid input(s): POCBNP  Recent Labs  06/26/14 1256  DDIMER 2.12*   No results for input(s): HGBA1C in the last 72 hours. No results for input(s): CHOL, HDL, LDLCALC, TRIG, CHOLHDL, LDLDIRECT in the last 72 hours. No results for input(s): TSH, T4TOTAL, T3FREE, THYROIDAB in the last 72 hours.  Invalid input(s): FREET3 No results for input(s): VITAMINB12, FOLATE, FERRITIN, TIBC, IRON, RETICCTPCT in the last 72 hours. Coags: No results for input(s): INR in the last 72 hours.  Invalid input(s): PT Microbiology: No results found for this or any previous visit (from the past 240 hour(s)).   BRIEF HOSPITAL COURSE:  Presyncope: Likely secondary to orthostatic hypotension from overdiuresis (on lasix/aldaactone prior to admission). Briefly hydrated overnight,with resolution of orthostatic vitals. 2D Echo showed preserved EF and grade 1 diastolic dysfunction. Telemetry was negative. VQ Scan and lower ext doppler was negative as well.    Bilateral lower extremity edema: Likely diabetic nephropathy-recent UA shows 3+ protein, albumin is also low. Urine protein/creatinine ratio significantly elevated >8! Resumed Lasix as orthostatics negative, currently BP to soft to add ACEI, please reassess renal function at next visit and add ACEI. Please refer patient to nephrology.Please follow HIV, ANA which are pending at time of discharge.Please note hep B/hepatitis C serology was negative    Minimal AKI vs CKD stage 3: please continue to monitor renal function closely as outpatient.  Given significant proteinuria,please refer to nephrology as outpatient. May need addition of ACE inhibitor when the people tolerate.   History of hypertension: Hold all antihypertensive medications for now, BP continues to be soft. We will cautiously resume Lasix.   Diabetes type 2, controlled: Resume Metformin on discharge. Last A1c in November 2015 was 6.5.If renal function worsens further may need to stop Metformin.   TODAY-DAY OF DISCHARGE:  Subjective:   [name] today has no headache,no chest abdominal pain,no new weakness tingling or numbness, feels much better wants to go home today.   Objective:   Blood pressure 94/59, pulse 88, temperature 98.2 F (36.8 C), temperature source Oral, resp. rate 18, height 5\' 1"  (1.549 m), weight 99.8 kg (220 lb 0.3 oz), SpO2 100 %.  Intake/Output Summary (Last 24 hours) at 06/28/14 0953 Last data filed at 06/28/14 0500  Gross per 24 hour  Intake      0 ml  Output   1550 ml  Net  -1550 ml   Filed Weights   06/26/14 1237 06/26/14 1810 06/28/14 0433  Weight: 97.977 kg (216 lb) 99.338 kg (219 lb) 99.8 kg (220  lb 0.3 oz)    Exam Awake Alert, Oriented *3, No new F.N deficits, Normal affect [place].AT,PERRAL Supple Neck,No JVD, No cervical lymphadenopathy appriciated.  Symmetrical Chest wall movement, Good air movement bilaterally, CTAB RRR,No Gallops,Rubs or new Murmurs, No Parasternal Heave +ve B.Sounds, Abd Soft, Non tender, No organomegaly appriciated, No rebound -guarding or rigidity. No Cyanosis, Clubbing, No new Rash or bruise. Still with 2+ edema  DISCHARGE CONDITION: Stable  DISPOSITION: Home  DISCHARGE INSTRUCTIONS:    Activity:  As tolerated   Diet recommendation: Diabetic Diet Heart Healthy diet  Discharge Instructions    Call MD for:  difficulty breathing, headache or visual disturbances    Complete by:  As directed      Call MD for:  extreme fatigue    Complete by:  As directed      Diet - low sodium heart  healthy    Complete by:  As directed      Diet Carb Modified    Complete by:  As directed      Increase activity slowly    Complete by:  As directed           Follow-up Information    Follow up with [name], MD. Schedule an appointment as soon as possible for a visit in 1 week.   Specialties:  Family Medicine, Radiology   Contact information:   [address] [place] [address] [phone]     Total Time spent on discharge equals 45 minutes.  SignedOren Binet 06/28/2014 9:53 AM

## 2014-06-27 NOTE — Progress Notes (Signed)
Echocardiogram 2D Echocardiogram has been performed.  [name] 06/27/2014, 11:10 AM

## 2014-06-27 NOTE — Progress Notes (Signed)
PATIENT DETAILS Name: [name] Age: 64 y.o. Sex: female Date of Birth: 01/09/1951 Admit Date: 06/26/2014 Admitting Physician Verlee Monte, MD [id number] Tower, MD  Subjective: No major issues overnight.  Assessment/Plan: Active Problems:   Presyncope: Likely secondary to orthostatic hypotension. Briefly hydrated overnight, stop IV fluids this morning. Recheck orthostatics. Await 2-D echocardiogram. Continue to monitor in telemetry. Ambulate with physical therapy.      Bilateral lower extremity edema: Likely diabetic nephropathy-recent UA shows 3+ protein, albumin is also low. Await transthoracic echocardiogram, check urine protein/creatinine ratio. If orthostatics better, restart diuretics, place TED hose. May need to be placed back on ACE inhibitor. Will check HIV, ANA, hep B/hepatitis C serology as well.    History of hypertension: Hold all antihypertensive medications for now, BP continues to be soft. We will cautiously resume Lasix.    Diabetes type 2, controlled: CBGs controlled, continue SSRI. Hold metformin while inpatient. Last A1c in November 2015 was 6.5.   Disposition: Remain inpatient-suspect home on 2/23  Antibiotics:  None   Anti-infectives    None      DVT Prophylaxis: Prophylactic  Heparin   Code Status: Full code   Family Communication None at bedside  Procedures:  None  CONSULTS:  None  Time spent 40 minutes-which includes 50% of the time with face-to-face with patient/ family and coordinating care related to the above assessment and plan.  MEDICATIONS: Scheduled Meds: . heparin  5,000 Units Subcutaneous 3 times per day  . insulin aspart  0-5 Units Subcutaneous QHS  . insulin aspart  0-9 Units Subcutaneous TID WC   Continuous Infusions:  PRN Meds:.acetaminophen **OR** acetaminophen, alum & mag hydroxide-simeth, morphine injection, ondansetron **OR** ondansetron (ZOFRAN) IV, oxyCODONE    PHYSICAL EXAM: Vital signs in last  24 hours: Filed Vitals:   06/26/14 2100 06/27/14 0037 06/27/14 0148 06/27/14 0523  BP: 97/68 93/60 124/62 112/63  Pulse: 91 91 102 90  Temp: 98 F (36.7 C) 98.6 F (37 C)  98.6 F (37 C)  TempSrc: Oral Oral  Oral  Resp: 18 16 20 18   Height:      Weight:      SpO2: 98% 100% 98% 98%    Weight change:  Filed Weights   06/26/14 1237 06/26/14 1810  Weight: 97.977 kg (216 lb) 99.338 kg (219 lb)   Body mass index is 41.4 kg/(m^2).   Gen Exam: Awake and alert with clear speech.   Neck: Supple, No JVD.   Chest: B/L Clear.   CVS: S1 S2 Regular, no murmurs.  Abdomen: soft, BS +, non tender, non distended.  Extremities: 2+ edema, lower extremities warm to touch. Neurologic: Non Focal.   Skin: No Rash.   Wounds: N/A.    Intake/Output from previous day:  Intake/Output Summary (Last 24 hours) at 06/27/14 0956 Last data filed at 06/27/14 0531  Gross per 24 hour  Intake 256.25 ml  Output    500 ml  Net -243.75 ml     LAB RESULTS: CBC  Recent Labs Lab 06/26/14 1256  WBC 7.1  HGB 13.0  HCT 38.3  PLT 425*  MCV 83.6  MCH 28.4  MCHC 33.9  RDW 17.5*    Chemistries   Recent Labs Lab 06/24/14 0957 06/26/14 1256 06/27/14 0550  NA 136 136 136  K 3.7 3.1* 3.8  CL 99 98 103  CO2 32 30 26  GLUCOSE 129* 125* 96  BUN 11 11 11   CREATININE 1.17 1.29*  1.20*  CALCIUM 8.8 8.8 8.3*    CBG:  Recent Labs Lab 06/26/14 1816 06/26/14 2312 06/27/14 0836  GLUCAP 81 99 91    GFR Estimated Creatinine Clearance: 50.5 mL/min (by C-G formula based on Cr of 1.2).  Coagulation profile No results for input(s): INR, PROTIME in the last 168 hours.  Cardiac Enzymes No results for input(s): CKMB, TROPONINI, MYOGLOBIN in the last 168 hours.  Invalid input(s): CK  Invalid input(s): POCBNP  Recent Labs  06/26/14 1256  DDIMER 2.12*   No results for input(s): HGBA1C in the last 72 hours. No results for input(s): CHOL, HDL, LDLCALC, TRIG, CHOLHDL, LDLDIRECT in the last 72  hours. No results for input(s): TSH, T4TOTAL, T3FREE, THYROIDAB in the last 72 hours.  Invalid input(s): FREET3 No results for input(s): VITAMINB12, FOLATE, FERRITIN, TIBC, IRON, RETICCTPCT in the last 72 hours. No results for input(s): LIPASE, AMYLASE in the last 72 hours.  Urine Studies No results for input(s): UHGB, CRYS in the last 72 hours.  Invalid input(s): UACOL, UAPR, USPG, UPH, UTP, UGL, UKET, UBIL, UNIT, UROB, ULEU, UEPI, UWBC, URBC, UBAC, CAST, UCOM, BILUA  MICROBIOLOGY: No results found for this or any previous visit (from the past 240 hour(s)).  RADIOLOGY STUDIES/RESULTS: Dg Chest 2 View (if Patient Has Fever And/or Copd)  06/26/2014   CLINICAL DATA:  Dizziness, bilateral leg swelling  EXAM: CHEST  2 VIEW  COMPARISON:  09/13/2013  FINDINGS: Cardiomediastinal silhouette is stable. No acute infiltrate or pleural effusion. No pulmonary edema. Mild degenerative changes thoracic spine.  IMPRESSION: No active cardiopulmonary disease.   Electronically Signed   By: Lahoma Crocker M.D.   On: 06/26/2014 13:51   Nm Pulmonary Perf And Vent  06/26/2014   CLINICAL DATA:  Bilateral lower extremity swelling, shortness of breath and elevated D-dimer  EXAM: NUCLEAR MEDICINE VENTILATION - PERFUSION LUNG SCAN  TECHNIQUE: Ventilation images were obtained in multiple projections using inhaled aerosol technetium 99 M DTPA. Perfusion images were obtained in multiple projections after intravenous injection of Tc-44m MAA.  RADIOPHARMACEUTICALS:  40.0 mCi Tc-46m DTPA aerosol and 6.0 mCi Tc-76m MAA  COMPARISON:  Chest x-ray same date.  FINDINGS: Ventilation: The ventilation scan is nondiagnostic. Deep patient could not perform the ventilation part.  Perfusion: No segmental or subsegmental perfusion defects to suggest pulmonary embolism.  IMPRESSION: Negative perfusion lung scan.  No evidence of pulmonary embolism.   Electronically Signed   By: Marijo Sanes M.D.   On: 06/26/2014 17:25    Oren Binet,  MD  [facility] Pager:[phone]  If 7PM-7AM, please contact night-coverage [email or web address] Password [facility] 06/27/2014, 9:56 AM

## 2014-06-27 NOTE — Progress Notes (Signed)
Patient BP 93/60, HR 101, Dr. Hilbert Bible notified, patient states she does have some dizziness when ambulating which is reason for her being admitted, patient resting comfortably in bed with no cardiac complaints, will continue to monitor.

## 2014-06-27 NOTE — Progress Notes (Signed)
VASCULAR LAB PRELIMINARY  PRELIMINARY  PRELIMINARY  PRELIMINARY  BLEV completed.    Preliminary report:  Significant soft tissue edema.  Negative DVT or superficial thrombus bilateral lower extremities.  August Albino, RVT 06/27/2014, 3:04 PM

## 2014-06-28 ENCOUNTER — Telehealth: Payer: Self-pay | Admitting: Family Medicine

## 2014-06-28 DIAGNOSIS — R600 Localized edema: Secondary | ICD-10-CM

## 2014-06-28 DIAGNOSIS — N049 Nephrotic syndrome with unspecified morphologic changes: Secondary | ICD-10-CM | POA: Insufficient documentation

## 2014-06-28 DIAGNOSIS — R55 Syncope and collapse: Secondary | ICD-10-CM | POA: Diagnosis not present

## 2014-06-28 DIAGNOSIS — N289 Disorder of kidney and ureter, unspecified: Secondary | ICD-10-CM | POA: Insufficient documentation

## 2014-06-28 LAB — BASIC METABOLIC PANEL
ANION GAP: 3 — AB (ref 5–15)
BUN: 14 mg/dL (ref 6–23)
CO2: 31 mmol/L (ref 19–32)
Calcium: 8.5 mg/dL (ref 8.4–10.5)
Chloride: 103 mmol/L (ref 96–112)
Creatinine, Ser: 1.17 mg/dL — ABNORMAL HIGH (ref 0.50–1.10)
GFR calc Af Amer: 55 mL/min — ABNORMAL LOW (ref >90)
GFR, EST NON AFRICAN AMERICAN: 48 mL/min — AB (ref >90)
GLUCOSE: 101 mg/dL — AB (ref 70–99)
POTASSIUM: 3.8 mmol/L (ref 3.5–5.1)
SODIUM: 137 mmol/L (ref 135–145)

## 2014-06-28 LAB — GLUCOSE, CAPILLARY: GLUCOSE-CAPILLARY: 106 mg/dL — AB (ref 70–99)

## 2014-06-28 LAB — HIV ANTIBODY (ROUTINE TESTING W REFLEX): HIV SCREEN 4TH GENERATION: NONREACTIVE

## 2014-06-28 MED ORDER — FUROSEMIDE 40 MG PO TABS: 40.0000 mg | ORAL_TABLET | Freq: Every day | ORAL | Status: DC

## 2014-06-28 NOTE — Care Management Note (Signed)
    Page 1 of 1   06/28/2014     1:40:21 PM CARE MANAGEMENT NOTE 06/28/2014  Patient:  [name], [name]   Account Number:  [id number]  Date Initiated:  06/28/2014  Documentation initiated by:  Tomi Bamberger  Subjective/Objective Assessment:   dxdm neuropathey  admit as obs     Action/Plan:   Anticipated DC Date:  06/28/2014   Anticipated DC Plan:  [place]  CM consult      Choice offered to / List presented to:             Status of service:  Completed, signed off Medicare Important Message given?  NA - LOS <3 / Initial given by admissions (If response is "NO", the following Medicare IM given date fields will be blank) Date Medicare IM given:   Medicare IM given by:   Date Additional Medicare IM given:   Additional Medicare IM given by:    Discharge Disposition:  HOME/SELF CARE  Per UR Regulation:  Reviewed for med. necessity/level of care/duration of stay  If discussed at [place] of Stay Meetings, dates discussed:    Comments:  06/28/14 [place], BSN [phone] no needs.

## 2014-06-28 NOTE — Progress Notes (Signed)
Patient discharge teaching given, including activity, diet, follow-up appoints, and medications. Patient verbalized understanding of all discharge instructions. IV access was d/c'd. Vitals are stable. Skin is intact except as charted in most recent assessments. Pt to be escorted out by NT, to be driven home by family. 

## 2014-06-28 NOTE — Progress Notes (Signed)
PT Cancellation Note  Patient Details Name: [name] MRN: [id number] DOB: 1951/03/24   Cancelled Treatment:    Reason Eval/Treat Not Completed: PT screened, no needs identified, will sign off. Pt stating she does not need PT and stood up and walked around room.  Did educate on safety and need to wear gripper socks.  Pt reports she has a lot of support with her neighbors and is ready to go home.  Pt politely declined any further PT assessment.  Will sign off.   Santiago Glad L. Tamala Julian, [place] Pager [phone] 06/28/2014    [name] 06/28/2014, 9:25 AM

## 2014-06-28 NOTE — Telephone Encounter (Signed)
Reviewed pt's labs and d/c summary Referral to nephrology for nephrotic syndrome

## 2014-06-29 NOTE — Telephone Encounter (Signed)
Called and spoken to patient. She is already aware of the referral and has appt with Dr Glori Bickers on 07/01/14.

## 2014-06-30 ENCOUNTER — Telehealth: Payer: Self-pay | Admitting: *Deleted

## 2014-06-30 NOTE — Telephone Encounter (Signed)
Referral request faxed to [facility] for Urgent appointment.

## 2014-06-30 NOTE — Telephone Encounter (Signed)
Patient called and cancelled her echo for 07/07/14. Patient stated that she was recently in the hospital at [facility] and had an echo there.

## 2014-07-01 ENCOUNTER — Ambulatory Visit: Payer: Medicare Other | Admitting: Family Medicine

## 2014-07-05 ENCOUNTER — Ambulatory Visit: Payer: Medicare Other | Admitting: Family Medicine

## 2014-07-06 ENCOUNTER — Ambulatory Visit (INDEPENDENT_AMBULATORY_CARE_PROVIDER_SITE_OTHER): Payer: Medicare Other | Admitting: Family Medicine

## 2014-07-06 ENCOUNTER — Encounter: Payer: Self-pay | Admitting: Family Medicine

## 2014-07-06 VITALS — BP 96/62 | HR 95 | Temp 97.4°F | Ht 61.0 in | Wt 216.8 lb

## 2014-07-06 DIAGNOSIS — N049 Nephrotic syndrome with unspecified morphologic changes: Secondary | ICD-10-CM

## 2014-07-06 DIAGNOSIS — I10 Essential (primary) hypertension: Secondary | ICD-10-CM

## 2014-07-06 DIAGNOSIS — I951 Orthostatic hypotension: Secondary | ICD-10-CM

## 2014-07-06 DIAGNOSIS — N289 Disorder of kidney and ureter, unspecified: Secondary | ICD-10-CM

## 2014-07-06 DIAGNOSIS — R600 Localized edema: Secondary | ICD-10-CM

## 2014-07-06 DIAGNOSIS — E119 Type 2 diabetes mellitus without complications: Secondary | ICD-10-CM

## 2014-07-06 LAB — RENAL FUNCTION PANEL
Albumin: 2.2 g/dL — ABNORMAL LOW (ref 3.5–5.2)
BUN: 15 mg/dL (ref 6–23)
CALCIUM: 9.1 mg/dL (ref 8.4–10.5)
CO2: 30 mEq/L (ref 19–32)
CREATININE: 1.27 mg/dL — AB (ref 0.40–1.20)
Chloride: 103 mEq/L (ref 96–112)
GFR: 54.2 mL/min — ABNORMAL LOW (ref >60.00)
Glucose, Bld: 108 mg/dL — ABNORMAL HIGH (ref 70–99)
POTASSIUM: 4.2 meq/L (ref 3.5–5.1)
Phosphorus: 3.3 mg/dL (ref 2.3–4.6)
SODIUM: 137 meq/L (ref 135–145)

## 2014-07-06 LAB — HEMOGLOBIN A1C: Hgb A1c MFr Bld: 6.4 % (ref 4.6–6.5)

## 2014-07-06 NOTE — Progress Notes (Signed)
Subjective:    Patient ID: [name], female    DOB: May 21, 1950, 64 y.o.   MRN: [id number]  HPI Here for hosp f/u 2/21-2/23  Dx with nephrotic syndrome   bp was too low from diuresis  Given fluids  Discharged on just the lasix due to bp too low for ace   Lab Results  Component Value Date   CREATININE 1.17* 06/28/2014   BUN 14 06/28/2014   NA 137 06/28/2014   K 3.8 06/28/2014   CL 103 06/28/2014   CO2 31 06/28/2014   2D echo in hospital was re assuring- she canceled the outpt one  Sees Dr Juleen China on 3/14   She is miserable -due to swelling all over - abd down to legs- (elevates legs at night)  She paces the floor due to general discomfort   She feels dizzy in the am - by eve is allright  Feels full- hard to eat because she feels so swollen    bp is still on the low side  She is wearing thigh high supp hose-even at night - wants to try some to the waist   No hx of kidney problems in family  No hx of this in her past either    Lab Results  Component Value Date   HGBA1C 6.5 03/14/2014  blood sugar remains very well controlled    On K  No cramps at all    Patient Active Problem List   Diagnosis Date Noted  . Nephrotic syndrome 06/28/2014  . Renal insufficiency 06/28/2014  . Acute renal failure 06/26/2014  . Dehydration 06/26/2014  . Orthostatic hypotension 06/26/2014  . Positive D dimer 06/26/2014  . Bilateral lower extremity edema 06/26/2014  . Hypokalemia 06/26/2014  . Dyspnea 06/26/2014  . Pedal edema 06/07/2014  . Tinea pedis 06/07/2014  . Shortness of breath 01/14/2014  . Right arm pain 01/11/2014  . Exercise intolerance 01/11/2014  . S/P right TKA 09/28/2013  . Deformity of toenail 06/14/2013  . Other screening mammogram 12/18/2010  . Routine general medical examination at a health care facility 12/08/2010  . Osteoarthritis of knee 09/13/2010  . Knee pain, bilateral 09/11/2010  . Obesity   . Diabetes type 2, controlled 02/26/2010  .  Essential hypertension 08/29/2009   Past Medical History  Diagnosis Date  . HTN (hypertension)   . Diabetes mellitus type II   . Obesity   . Arthritis    Past Surgical History  Procedure Laterality Date  . Total abdominal hysterectomy  10/1999    fibroids  . Combined abdominoplasty and liposuction  07/2000  . Total knee arthroplasty Right 09/28/2013    Procedure: RIGHT TOTAL KNEE ARTHROPLASTY;  Surgeon: Mauri Pole, MD;  Location: [facility];  Service: Orthopedics;  Laterality: Right;   History  Substance Use Topics  . Smoking status: Never Smoker   . Smokeless tobacco: Never Used  . Alcohol Use: No   Family History  Problem Relation Age of Onset  . Arthritis Mother   . Hypertension Mother   . Cancer Mother     breast, lung  . Diabetes Mother   . Hypertension Father    No Known Allergies Current Outpatient Prescriptions on File Prior to Visit  Medication Sig Dispense Refill  . furosemide (LASIX) 40 MG tablet Take 1 tablet (40 mg total) by mouth daily. 30 tablet 0  . metFORMIN (GLUCOPHAGE) 1000 MG tablet TAKE 1 TABLET BY MOUTH TWICE A DAY WITH MEAL 60 tablet 5  .  naftifine (NAFTIN) 1 % cream Apply topically daily. To affected areas/feet 90 g 1  . potassium chloride SA (K-DUR,KLOR-CON) 20 MEQ tablet Take 1 tablet (20 mEq total) by mouth daily. 30 tablet 0  . pseudoephedrine-guaifenesin (MUCINEX D) 60-600 MG per tablet Take 1 tablet by mouth every 12 (twelve) hours as needed for congestion.     No current facility-administered medications on file prior to visit.    Review of Systems    Review of Systems  Constitutional: Negative for fever, appetite change, and unexpected weight change.  Eyes: Negative for pain and visual disturbance.  Respiratory: Negative for cough and pos for sob when lying flat/ neg for PND  Cardiovascular: Negative for cp or palpitations    Gastrointestinal: Negative for nausea, diarrhea and constipation. pos for full feeling  Genitourinary: Negative  for urgency and frequency. neg for hematuria  Skin: Negative for pallor or rash   Neurological: Negative for weakness, light-headedness, numbness and headaches.  Hematological: Negative for adenopathy. Does not bruise/bleed easily.  Psychiatric/Behavioral: Negative for dysphoric mood. The patient is not nervous/anxious.      Objective:   Physical Exam  Constitutional: She appears well-developed and well-nourished. No distress.  HENT:  Head: Normocephalic and atraumatic.  Mouth/Throat: Oropharynx is clear and moist.  Eyes: Conjunctivae and EOM are normal. Pupils are equal, round, and reactive to light. No scleral icterus.  Neck: Normal range of motion. Neck supple. No JVD present. Carotid bruit is not present. No thyromegaly present.  Cardiovascular: Normal rate, regular rhythm, normal heart sounds and intact distal pulses.  Exam reveals no gallop.   Pulmonary/Chest: Effort normal and breath sounds normal. No respiratory distress. She has no wheezes. She has no rales.  No crackles   Abdominal: Soft. Bowel sounds are normal. She exhibits no distension and no mass. There is no tenderness. There is no rebound and no guarding.  Musculoskeletal: She exhibits edema. She exhibits no tenderness.  1-2 plus pedal edema to knee  No palp cords or tenderness   Lymphadenopathy:    She has no cervical adenopathy.  Neurological: She is alert. She has normal reflexes. No cranial nerve deficit. She exhibits normal muscle tone. Coordination normal.  Skin: Skin is warm and dry. No rash noted. No erythema. No pallor.  Psychiatric: She has a normal mood and affect.          Assessment & Plan:   Problem List Items Addressed This Visit      Cardiovascular and Mediastinum   Essential hypertension - Primary   Orthostatic hypotension    After over diuresis for sympt edema - now dx with nephrotic syndrome Holding ace Taking lasix only as needed  Pending nephrology appt           Endocrine    Diabetes type 2, controlled    A1C today Recent dx nephrotic syndrome       Relevant Orders   Hemoglobin A1c (Completed)     Genitourinary   Nephrotic syndrome    Disc condition with pt  Rev hospital records in detail  Watching for further orthostatic hypotension (holding ace)  On lasix as tolerated and K Lab today  Pending nephrology consult       Relevant Orders   Renal function panel (Completed)   Renal insufficiency    In diabetic female with neprhotic syndrome Pending nephrology appt Disc imp of fluid intake  Avoid nsaids and nephrotoxic meds Holding ace due to low bp -will hopefully be able to re start it  Relevant Orders   Renal function panel (Completed)     Other   Pedal edema    From nephrotic syndrome  Px for supp hose to waist given 15-20 mmHg as tolerated  Continues lasix as long as no hypotension  Lab today  Renal consult upcoming

## 2014-07-06 NOTE — Assessment & Plan Note (Signed)
A1C today Recent dx nephrotic syndrome

## 2014-07-06 NOTE — Assessment & Plan Note (Signed)
After over diuresis for sympt edema - now dx with nephrotic syndrome Holding ace Taking lasix only as needed  Pending nephrology appt

## 2014-07-06 NOTE — Progress Notes (Signed)
Pre visit review using our clinic review tool, if applicable. No additional management support is needed unless otherwise documented below in the visit note. 

## 2014-07-06 NOTE — Patient Instructions (Signed)
I think you have a condition called nephrotic syndrome, you are spilling protein from kidneys and this causes you to swell We will try support hose to the waist for relief  Lab today for kidney function and potassium level and A1C Call the kidney doctor's office - see if you can get on the cancellation list   If you get more dizzy , let me know   As long as you take lasix take the potassium also

## 2014-07-07 ENCOUNTER — Other Ambulatory Visit: Payer: Medicare Other

## 2014-07-07 ENCOUNTER — Encounter: Payer: Self-pay | Admitting: *Deleted

## 2014-07-07 NOTE — Assessment & Plan Note (Signed)
From nephrotic syndrome  Px for supp hose to waist given 15-20 mmHg as tolerated  Continues lasix as long as no hypotension  Lab today  Renal consult upcoming

## 2014-07-07 NOTE — Assessment & Plan Note (Signed)
Disc condition with pt  Rev hospital records in detail  Watching for further orthostatic hypotension (holding ace)  On lasix as tolerated and K Lab today  Pending nephrology consult

## 2014-07-07 NOTE — Assessment & Plan Note (Signed)
In diabetic female with neprhotic syndrome Pending nephrology appt Disc imp of fluid intake  Avoid nsaids and nephrotoxic meds Holding ace due to low bp -will hopefully be able to re start it

## 2014-07-22 ENCOUNTER — Ambulatory Visit: Payer: Self-pay | Admitting: Nephrology

## 2014-07-25 ENCOUNTER — Other Ambulatory Visit: Payer: Self-pay

## 2014-07-25 ENCOUNTER — Other Ambulatory Visit: Payer: Self-pay | Admitting: Nephrology

## 2014-07-25 MED ORDER — POTASSIUM CHLORIDE CRYS ER 20 MEQ PO TBCR
20.0000 meq | EXTENDED_RELEASE_TABLET | Freq: Every day | ORAL | Status: AC
Start: 2014-07-25 — End: ?

## 2014-07-27 ENCOUNTER — Observation Stay: Payer: Self-pay | Admitting: Nephrology

## 2014-07-27 LAB — URINALYSIS, COMPLETE
BACTERIA: NONE SEEN
BILIRUBIN, UR: NEGATIVE
GLUCOSE, UR: NEGATIVE mg/dL (ref 0–75)
Hyaline Cast: 10
Ketone: NEGATIVE
Leukocyte Esterase: NEGATIVE
Nitrite: NEGATIVE
Ph: 5 (ref 4.5–8.0)
Protein: >=500
RBC,UR: 10 /HPF (ref 0–5)
SPECIFIC GRAVITY: 1.017 (ref 1.003–1.030)

## 2014-07-27 LAB — HEMOGLOBIN: HGB: 12.1 g/dL (ref 12.0–16.0)

## 2014-07-28 LAB — CBC WITH DIFFERENTIAL/PLATELET
BASOS PCT: 0.7 %
Basophil #: 0.1 10*3/uL (ref 0.0–0.1)
EOS PCT: 0.2 %
Eosinophil #: 0 10*3/uL (ref 0.0–0.7)
HCT: 31.7 % — ABNORMAL LOW (ref 35.0–47.0)
HGB: 10.2 g/dL — AB (ref 12.0–16.0)
LYMPHS ABS: 4.6 10*3/uL — AB (ref 1.0–3.6)
Lymphocyte %: 44.3 %
MCH: 28 pg (ref 26.0–34.0)
MCHC: 32.2 g/dL (ref 32.0–36.0)
MCV: 87 fL (ref 80–100)
Monocyte #: 0.9 x10 3/mm (ref 0.2–0.9)
Monocyte %: 8.5 %
NEUTROS PCT: 46.3 %
Neutrophil #: 4.8 10*3/uL (ref 1.4–6.5)
Platelet: 521 10*3/uL — ABNORMAL HIGH (ref 150–440)
RBC: 3.64 10*6/uL — AB (ref 3.80–5.20)
RDW: 18.1 % — ABNORMAL HIGH (ref 11.5–14.5)
WBC: 10.4 10*3/uL (ref 3.6–11.0)

## 2014-07-28 LAB — BASIC METABOLIC PANEL
Anion Gap: 2 — ABNORMAL LOW (ref 7–16)
BUN: 30 mg/dL — ABNORMAL HIGH
CO2: 27 mmol/L
Calcium, Total: 7.8 mg/dL — ABNORMAL LOW
Chloride: 107 mmol/L
Creatinine: 1.33 mg/dL — ABNORMAL HIGH
EGFR (African American): 48 — ABNORMAL LOW
EGFR (Non-African Amer.): 42 — ABNORMAL LOW
Glucose: 83 mg/dL
POTASSIUM: 3.8 mmol/L
Sodium: 136 mmol/L

## 2014-08-01 LAB — PROTIME-INR
INR: 1.8
Prothrombin Time: 21.4 secs — ABNORMAL HIGH

## 2014-08-22 ENCOUNTER — Ambulatory Visit
Admit: 2014-08-22 | Disposition: A | Discharge status: Home / Self Care | Payer: Self-pay | Attending: Oncology | Admitting: Oncology

## 2014-08-22 LAB — COMPREHENSIVE METABOLIC PANEL
ALBUMIN: 1.7 g/dL — AB
ALK PHOS: 294 U/L — AB
ANION GAP: 9 (ref 7–16)
BUN: 29 mg/dL — ABNORMAL HIGH
Bilirubin,Total: 0.6 mg/dL
CHLORIDE: 92 mmol/L — AB
Calcium, Total: 8.2 mg/dL — ABNORMAL LOW
Co2: 35 mmol/L — ABNORMAL HIGH
Creatinine: 1.78 mg/dL — ABNORMAL HIGH
EGFR (African American): 34 — ABNORMAL LOW
EGFR (Non-African Amer.): 29 — ABNORMAL LOW
Glucose: 167 mg/dL — ABNORMAL HIGH
Potassium: 2.6 mmol/L — ABNORMAL LOW
SGOT(AST): 40 U/L
SGPT (ALT): 22 U/L
Sodium: 136 mmol/L
TOTAL PROTEIN: 5 g/dL — AB

## 2014-08-22 LAB — CBC CANCER CENTER
BASOS ABS: 0.1 x10 3/mm (ref 0.0–0.1)
Basophil %: 1.3 %
Eosinophil #: 0 x10 3/mm (ref 0.0–0.7)
Eosinophil %: 0.3 %
HCT: 38.3 % (ref 35.0–47.0)
HGB: 12.3 g/dL (ref 12.0–16.0)
Lymphocyte #: 4.2 x10 3/mm — ABNORMAL HIGH (ref 1.0–3.6)
Lymphocyte %: 45.4 %
MCH: 28 pg (ref 26.0–34.0)
MCHC: 32.1 g/dL (ref 32.0–36.0)
MCV: 87 fL (ref 80–100)
Monocyte #: 0.9 x10 3/mm (ref 0.2–0.9)
Monocyte %: 9.8 %
NEUTROS PCT: 43.2 %
Neutrophil #: 4 x10 3/mm (ref 1.4–6.5)
PLATELETS: 503 x10 3/mm — AB (ref 150–440)
RBC: 4.38 10*6/uL (ref 3.80–5.20)
RDW: 18.1 % — ABNORMAL HIGH (ref 11.5–14.5)
WBC: 9.2 x10 3/mm (ref 3.6–11.0)

## 2014-08-22 LAB — PROTIME-INR
INR: 1.9
Prothrombin Time: 22 secs — ABNORMAL HIGH

## 2014-08-22 LAB — PRO B NATRIURETIC PEPTIDE: B-Type Natriuretic Peptide: 157 pg/mL — ABNORMAL HIGH

## 2014-08-22 LAB — APTT: Activated PTT: 40.6 secs — ABNORMAL HIGH (ref 23.6–35.9)

## 2014-08-23 LAB — FREE K+L LT CHAIN, QT, UR (24 HOUR)

## 2014-08-24 LAB — PROT IMMUNOELECTROPHORES(ARMC)

## 2014-08-24 LAB — URINE IEP, RANDOM

## 2014-08-24 LAB — KAPPA/LAMBDA FREE LIGHT CHAINS (ARMC)

## 2014-08-29 LAB — SURGICAL PATHOLOGY

## 2014-08-30 ENCOUNTER — Ambulatory Visit
Admit: 2014-08-30 | Disposition: A | Discharge status: Home / Self Care | Payer: Self-pay | Attending: Oncology | Admitting: Oncology

## 2014-09-01 ENCOUNTER — Other Ambulatory Visit: Payer: Self-pay | Admitting: Oncology

## 2014-09-01 DIAGNOSIS — E859 Amyloidosis, unspecified: Secondary | ICD-10-CM

## 2014-09-07 ENCOUNTER — Other Ambulatory Visit: Payer: Self-pay | Admitting: Radiology

## 2014-09-08 ENCOUNTER — Encounter (HOSPITAL_COMMUNITY): Payer: Self-pay

## 2014-09-08 ENCOUNTER — Ambulatory Visit (HOSPITAL_COMMUNITY)
Admission: RE | Admit: 2014-09-08 | Discharge: 2014-09-08 | Disposition: A | Discharge status: Home / Self Care | Payer: Medicare Other | Source: Ambulatory Visit | Attending: Oncology | Admitting: Oncology

## 2014-09-08 DIAGNOSIS — N049 Nephrotic syndrome with unspecified morphologic changes: Secondary | ICD-10-CM | POA: Diagnosis not present

## 2014-09-08 DIAGNOSIS — D649 Anemia, unspecified: Secondary | ICD-10-CM | POA: Insufficient documentation

## 2014-09-08 DIAGNOSIS — E119 Type 2 diabetes mellitus without complications: Secondary | ICD-10-CM | POA: Diagnosis not present

## 2014-09-08 DIAGNOSIS — E859 Amyloidosis, unspecified: Secondary | ICD-10-CM | POA: Diagnosis present

## 2014-09-08 DIAGNOSIS — D473 Essential (hemorrhagic) thrombocythemia: Secondary | ICD-10-CM | POA: Insufficient documentation

## 2014-09-08 DIAGNOSIS — I129 Hypertensive chronic kidney disease with stage 1 through stage 4 chronic kidney disease, or unspecified chronic kidney disease: Secondary | ICD-10-CM | POA: Diagnosis not present

## 2014-09-08 DIAGNOSIS — N189 Chronic kidney disease, unspecified: Secondary | ICD-10-CM | POA: Diagnosis not present

## 2014-09-08 DIAGNOSIS — E669 Obesity, unspecified: Secondary | ICD-10-CM | POA: Diagnosis not present

## 2014-09-08 HISTORY — DX: Chronic kidney disease, unspecified: N18.9

## 2014-09-08 HISTORY — DX: Amyloidosis, unspecified: E85.9

## 2014-09-08 LAB — CBC WITH DIFFERENTIAL/PLATELET
BASOS ABS: 0 10*3/uL (ref 0.0–0.1)
BASOS PCT: 0 % (ref 0–1)
EOS PCT: 0 % (ref 0–5)
Eosinophils Absolute: 0 10*3/uL (ref 0.0–0.7)
HCT: 34.4 % — ABNORMAL LOW (ref 36.0–46.0)
Hemoglobin: 11.5 g/dL — ABNORMAL LOW (ref 12.0–15.0)
Lymphocytes Relative: 29 % (ref 12–46)
Lymphs Abs: 2.5 10*3/uL (ref 0.7–4.0)
MCH: 28.6 pg (ref 26.0–34.0)
MCHC: 33.4 g/dL (ref 30.0–36.0)
MCV: 85.6 fL (ref 78.0–100.0)
MONO ABS: 0.8 10*3/uL (ref 0.1–1.0)
Monocytes Relative: 9 % (ref 3–12)
NEUTROS ABS: 5.3 10*3/uL (ref 1.7–7.7)
Neutrophils Relative %: 62 % (ref 43–77)
PLATELETS: 442 10*3/uL — AB (ref 150–400)
RBC: 4.02 MIL/uL (ref 3.87–5.11)
RDW: 18.3 % — AB (ref 11.5–15.5)
WBC: 8.6 10*3/uL (ref 4.0–10.5)

## 2014-09-08 LAB — PROTIME-INR
INR: 1.93 — ABNORMAL HIGH (ref 0.00–1.49)
Prothrombin Time: 22.3 seconds — ABNORMAL HIGH (ref 11.6–15.2)

## 2014-09-08 LAB — BASIC METABOLIC PANEL
Anion gap: 5 (ref 5–15)
BUN: 26 mg/dL — ABNORMAL HIGH (ref 6–20)
CHLORIDE: 96 mmol/L — AB (ref 101–111)
CO2: 31 mmol/L (ref 22–32)
CREATININE: 1.58 mg/dL — AB (ref 0.44–1.00)
Calcium: 7.8 mg/dL — ABNORMAL LOW (ref 8.9–10.3)
GFR calc Af Amer: 39 mL/min — ABNORMAL LOW (ref >60)
GFR, EST NON AFRICAN AMERICAN: 33 mL/min — AB (ref >60)
Glucose, Bld: 138 mg/dL — ABNORMAL HIGH (ref 70–99)
Potassium: 2.9 mmol/L — ABNORMAL LOW (ref 3.5–5.1)
Sodium: 132 mmol/L — ABNORMAL LOW (ref 135–145)

## 2014-09-08 LAB — BONE MARROW EXAM

## 2014-09-08 LAB — APTT: aPTT: 43 seconds — ABNORMAL HIGH (ref 24–37)

## 2014-09-08 MED ORDER — MIDAZOLAM HCL 2 MG/2ML IJ SOLN
INTRAMUSCULAR | Status: AC
  Filled 2014-09-08: qty 6

## 2014-09-08 MED ORDER — MIDAZOLAM HCL 2 MG/2ML IJ SOLN
INTRAMUSCULAR | Status: AC | PRN
  Administered 2014-09-08: 1 mg via INTRAVENOUS

## 2014-09-08 MED ORDER — SODIUM CHLORIDE 0.9 % IV SOLN
INTRAVENOUS | Status: DC
  Administered 2014-09-08: 08:00:00 via INTRAVENOUS

## 2014-09-08 MED ORDER — FENTANYL CITRATE (PF) 100 MCG/2ML IJ SOLN
INTRAMUSCULAR | Status: AC | PRN
  Administered 2014-09-08: 25 ug via INTRAVENOUS

## 2014-09-08 MED ORDER — FENTANYL CITRATE (PF) 100 MCG/2ML IJ SOLN
INTRAMUSCULAR | Status: AC
Start: 2014-09-08 — End: 2014-09-08
  Filled 2014-09-08: qty 4

## 2014-09-08 NOTE — H&P (Signed)
Chief Complaint: Amyloidosis Nephrotic syndrome  Referring Physician(s): Finnegan,Timothy J  History of Present Illness: [name] is a 64 y.o. female   Pt diagnosed with nephrotic syndrome Generalized weakness; edema Renal bx per pt just last week with Radiology at [facility] Hx amyloidosis Now scheduled for bone marrow bx   Past Medical History  Diagnosis Date  . HTN (hypertension)   . Diabetes mellitus type II   . Obesity   . Arthritis   . Chronic kidney disease   . Amyloidosis     Past Surgical History  Procedure Laterality Date  . Total abdominal hysterectomy  10/1999    fibroids  . Combined abdominoplasty and liposuction  07/2000  . Total knee arthroplasty Right 09/28/2013    Procedure: RIGHT TOTAL KNEE ARTHROPLASTY;  Surgeon: Mauri Pole, MD;  Location: [facility];  Service: Orthopedics;  Laterality: Right;    Allergies: Review of patient's allergies indicates no known allergies.  Medications: Prior to Admission medications   Medication Sig Start Date End Date Taking? Authorizing Provider  metolazone (ZAROXOLYN) 5 MG tablet Take 5 mg by mouth daily.   Yes Historical Provider, MD  midodrine (PROAMATINE) 10 MG tablet Take 10 mg by mouth 3 (three) times daily.   Yes Historical Provider, MD  potassium chloride SA (K-DUR,KLOR-CON) 20 MEQ tablet Take 1 tablet (20 mEq total) by mouth daily. 07/25/14  Yes Abner Greenspan, MD  torsemide (DEMADEX) 20 MG tablet Take 20 mg by mouth 3 (three) times daily.   Yes Historical Provider, MD  furosemide (LASIX) 40 MG tablet Take 1 tablet (40 mg total) by mouth daily. Patient not taking: Reported on 09/02/2014 06/28/14   Jonetta Osgood, MD  metFORMIN (GLUCOPHAGE) 1000 MG tablet TAKE 1 TABLET BY MOUTH TWICE A DAY WITH MEAL Patient not taking: Reported on 09/02/2014 12/14/13   Abner Greenspan, MD  naftifine (NAFTIN) 1 % cream Apply topically daily. To affected areas/feet Patient taking differently: Apply 1 application  topically daily as needed (affected areas on feet.).  06/07/14   Abner Greenspan, MD     Family History  Problem Relation Age of Onset  . Arthritis Mother   . Hypertension Mother   . Cancer Mother     breast, lung  . Diabetes Mother   . Hypertension Father     History   Social History  . Marital Status: Single    Spouse Name: N/A  . Number of Children: 1  . Years of Education: N/A   Social History Main Topics  . Smoking status: Never Smoker   . Smokeless tobacco: Never Used  . Alcohol Use: No  . Drug Use: No  . Sexual Activity: Not on file   Other Topics Concern  . None   Social History Narrative    Review of Systems: A 12 point ROS discussed and pertinent positives are indicated in the HPI above.  All other systems are negative.  Review of Systems  Constitutional: Positive for activity change and fatigue. Negative for fever.  Eyes: Positive for itching.  Respiratory: Positive for shortness of breath. Negative for cough and wheezing.   Cardiovascular: Positive for leg swelling.  Gastrointestinal: Negative for nausea and abdominal pain.  Neurological: Positive for weakness.  Psychiatric/Behavioral: Negative for behavioral problems and confusion.    Vital Signs: BP 108/71 mmHg  Pulse 89  Temp(Src) 97.6 F (36.4 C) (Oral)  Resp 16  Ht $R'5\' 1"'zq$  (1.549 m)  Wt 96.616 kg (213 lb)  BMI 40.27 kg/m2  SpO2 98%  Physical Exam  Constitutional: She is oriented to person, place, and time. She appears well-nourished.  Cardiovascular: Normal rate, regular rhythm and normal heart sounds.   No murmur heard. Pulmonary/Chest: Effort normal and breath sounds normal. She has no wheezes.  Abdominal: Soft. Bowel sounds are normal. There is no tenderness.  Musculoskeletal: Normal range of motion. She exhibits edema.  Minimal B extr edema  Neurological: She is alert and oriented to person, place, and time.  Skin: Skin is warm and dry.  Psychiatric: She has a normal mood and affect.  Her behavior is normal. Judgment and thought content normal.  Nursing note and vitals reviewed.   Mallampati Score:  MD Evaluation Airway: WNL Heart: WNL Abdomen: WNL Chest/ Lungs: WNL ASA  Classification: 3 Mallampati/Airway Score: One  Imaging: Ct Chest Abdomen And Pelvis Wo ([facility] Hx)  08/31/2014   CLINICAL DATA:  Amyloidosis. Leg swelling since January. Diabetic. Elevated creatinine.  EXAM: CT CHEST, ABDOMEN AND PELVIS WITHOUT CONTRAST  TECHNIQUE: Multidetector CT imaging of the chest, abdomen and pelvis was performed following the standard protocol without IV contrast.  COMPARISON:  Clinic note of 08/22/2014. Chest radiograph 06/26/2014.  FINDINGS: CT CHEST FINDINGS  Mediastinum/Nodes: No axillary adenopathy. Aortic atherosclerosis. Tortuous descending thoracic aorta. Mild cardiomegaly, without pericardial effusion.  No mediastinal or definite hilar adenopathy, given limitations of unenhanced CT.  Lungs/Pleura: Trace left pleural fluid or thickening. Volume loss at the lung bases anteriorly, greater on the right. There is also bibasilar subsegmental atelectasis or scar.  Musculoskeletal: Lipoma in the right axilla measures 4.6 cm. Bilateral glenohumeral joint osteoarthritis.  CT ABDOMEN AND PELVIS FINDINGS  Hepatobiliary: Hepatomegaly, 20 cm. Small gallstones without acute cholecystitis or biliary ductal dilatation.  Pancreas: Normal, without mass or ductal dilatation.  Spleen: Normal  Adrenals/Urinary Tract: Normal left adrenal gland. Low-density right adrenal nodule measures 2.4 cm, consistent with an adenoma.  No renal calculi or hydronephrosis.    No bladder calculi.  Stomach/Bowel: Underdistended stomach. Normal colon, appendix, and terminal ileum. Normal small bowel.  Vascular/Lymphatic: Normal caliber of the aorta and branch vessels. No abdominopelvic adenopathy.  Reproductive: Hysterectomy.  No adnexal mass.  Other: Small volume abdominal pelvic ascites. Anasarca about the lower pelvis  and imaged upper extremities  Musculoskeletal: 8 mm lucent lesion in the right iliac. Thoracolumbar spondylosis. Prominent disc bulges at L3-4 and L4-5.  IMPRESSION: 1. Isolated subtle nonspecific subcentimeter right iliac lucent lesion. Otherwise, no evidence of multiple myeloma. 2. No adenopathy within the chest, abdomen, or pelvis. 3. Abdominal pelvic ascites and anasarca. Question fluid overload/congestive heart failure. 4. Right adrenal adenoma. 5. Trace left pleural fluid. 6. Cholelithiasis.   Electronically Signed   By: Abigail Miyamoto M.D.   On: 08/31/2014 08:58    Labs:  CBC:  Recent Labs  06/26/14 1256 07/27/14 1820 07/28/14 0550 08/22/14 1712 09/08/14 0730  WBC 7.1  --  10.4 9.2 8.6  HGB 13.0 12.1 10.2* 12.3 11.5*  HCT 38.3  --  31.7* 38.3 34.4*  PLT 425*  --  521* 503* 442*    COAGS:  Recent Labs  09/20/13 0940 07/27/14 0937 08/22/14 1712 09/08/14 0730  INR 0.95 1.8 1.9 1.93*  APTT 34  --  40.6* 43*    BMP:  Recent Labs  06/28/14 0520 07/06/14 1216 07/28/14 0550 08/22/14 1712 09/08/14 0730  NA 137 137 136 136 132*  K 3.8 4.2 3.8 2.6* 2.9*  CL 103 103 107 92* 96*  CO2 31 30 27  35* 31  GLUCOSE 101* 108* 83 167* 138*  BUN 14 15 30* 29* 26*  CALCIUM 8.5 9.1 7.8* 8.2* 7.8*  CREATININE 1.17* 1.27* 1.33* 1.78* 1.58*  GFRNONAA 48*  --  42* 29* 33*  GFRAA 55*  --  48* 34* 39*    LIVER FUNCTION TESTS:  Recent Labs  01/11/14 0917 06/24/14 0957 07/06/14 1216 08/22/14 1712  BILITOT 0.4 0.4  --   --   AST 19 45*  --  40  ALT 13 19  --  22  ALKPHOS 61 177*  --  294*  PROT 6.4 5.3*  --  5.0*  ALBUMIN 3.4* 2.1*  2.1* 2.2* 1.7*    TUMOR MARKERS: No results for input(s): AFPTM, CEA, CA199, CHROMGRNA in the last 8760 hours.  Assessment and Plan:  Hx amyloidosis Nephrotic syndrome Renal bx last week Now scheduled for BM bx Risks and Benefits discussed with the patient including, but not limited to bleeding, infection, damage to adjacent structures or  low yield requiring additional tests. All of the patient's questions were answered, patient is agreeable to proceed. Consent signed and in chart.   Thank you for this interesting consult.  I greatly enjoyed meeting Tiajah Oyster and look forward to participating in their care.  Signed: Neala Miggins A 09/08/2014, 8:16 AM   I spent a total of  20 Minutes   in face to face in clinical consultation, greater than 50% of which was counseling/coordinating care for BM bx

## 2014-09-08 NOTE — Procedures (Signed)
Successful RT ILIAC BM ASP AND CORE BX NO COMP STABLE PATH PENDING FULL REPORT IN PACS

## 2014-09-08 NOTE — Discharge Instructions (Signed)
Bone Marrow Aspiration, Bone Marrow Biopsy °Care After °Read the instructions outlined below and refer to this sheet in the next few weeks. These discharge instructions provide you with general information on caring for yourself after you leave the hospital. Your caregiver may also give you specific instructions. While your treatment has been planned according to the most current medical practices available, unavoidable complications occasionally occur. If you have any problems or questions after discharge, call your caregiver. °FINDING OUT THE RESULTS OF YOUR TEST °Not all test results are available during your visit. If your test results are not back during the visit, make an appointment with your caregiver to find out the results. Do not assume everything is normal if you have not heard from your caregiver or the medical facility. It is important for you to follow up on all of your test results.  °HOME CARE INSTRUCTIONS  °You have had sedation and may be sleepy or dizzy. Your thinking may not be as clear as usual. For the next 24 hours: °· Only take over-the-counter or prescription medicines for pain, discomfort, and or fever as directed by your caregiver. °· Do not drink alcohol. °· Do not smoke. °· Do not drive. °· Do not make important legal decisions. °· Do not operate heavy machinery. °· Do not care for small children by yourself. °· Keep your dressing clean and dry. You may replace dressing with a bandage after 24 hours. °· You may take a bath or shower after 24 hours. °· Use an ice pack for 20 minutes every 2 hours while awake for pain as needed. °SEEK MEDICAL CARE IF:  °· There is redness, swelling, or increasing pain at the biopsy site. °· There is pus coming from the biopsy site. °· There is drainage from a biopsy site lasting longer than one day. °· An unexplained oral temperature above 102° F (38.9° C) develops. °SEEK IMMEDIATE MEDICAL CARE IF:  °· You develop a rash. °· You have difficulty  breathing. °· You develop any reaction or side effects to medications given. °Document Released: 11/09/2004 Document Revised: 07/15/2011 Document Reviewed: 04/19/2008 °ExitCare® Patient Information ©2015 [employer]. This information is not intended to replace advice given to you by your health care provider. Make sure you discuss any questions you have with your health care provider. °Conscious Sedation °Sedation is the use of medicines to promote relaxation and relieve discomfort and anxiety. Conscious sedation is a type of sedation. Under conscious sedation you are less alert than normal but are still able to respond to instructions or stimulation. Conscious sedation is used during short medical and dental procedures. It is milder than deep sedation or general anesthesia and allows you to return to your regular activities sooner.  °LET YOUR HEALTH CARE PROVIDER KNOW ABOUT:  °· Any allergies you have. °· All medicines you are taking, including vitamins, herbs, eye drops, creams, and over-the-counter medicines. °· Use of steroids (by mouth or creams). °· Previous problems you or members of your family have had with the use of anesthetics. °· Any blood disorders you have. °· Previous surgeries you have had. °· Medical conditions you have. °· Possibility of pregnancy, if this applies. °· Use of cigarettes, alcohol, or illegal drugs. °RISKS AND COMPLICATIONS °Generally, this is a safe procedure. However, as with any procedure, problems can occur. Possible problems include: °· Oversedation. °· Trouble breathing on your own. You may need to have a breathing tube until you are awake and breathing on your own. °· Allergic reaction   to any of the medicines used for the procedure. °BEFORE THE PROCEDURE °· You may have blood tests done. These tests can help show how well your kidneys and liver are working. They can also show how well your blood clots. °· A physical exam will be done.   °· Only take medicines as directed by  your health care provider. You may need to stop taking medicines (such as blood thinners, aspirin, or nonsteroidal anti-inflammatory drugs) before the procedure.   °· Do not eat or drink at least 6 hours before the procedure or as directed by your health care provider. °· Arrange for a responsible adult, family member, or friend to take you home after the procedure. He or she should stay with you for at least 24 hours after the procedure, until the medicine has worn off. °PROCEDURE  °· An intravenous (IV) catheter will be inserted into one of your veins. Medicine will be able to flow directly into your body through this catheter. You may be given medicine through this tube to help prevent pain and help you relax. °· The medical or dental procedure will be done. °AFTER THE PROCEDURE °· You will stay in a recovery area until the medicine has worn off. Your blood pressure and pulse will be checked.   °·  Depending on the procedure you had, you may be allowed to go home when you can tolerate liquids and your pain is under control. °Document Released: 01/15/2001 Document Revised: 04/27/2013 Document Reviewed: 12/28/2012 °ExitCare® Patient Information ©2015 [employer]. This information is not intended to replace advice given to you by your health care provider. Make sure you discuss any questions you have with your health care provider. ° °

## 2014-09-12 ENCOUNTER — Encounter: Payer: Self-pay | Admitting: Oncology

## 2014-09-13 ENCOUNTER — Inpatient Hospital Stay: Payer: Medicare Other | Admitting: Internal Medicine

## 2014-09-13 ENCOUNTER — Inpatient Hospital Stay: Payer: Medicare Other | Attending: Internal Medicine | Admitting: Oncology

## 2014-09-13 VITALS — BP 96/69 | HR 86 | Temp 95.2°F | Resp 16 | Wt 211.0 lb

## 2014-09-13 DIAGNOSIS — E859 Amyloidosis, unspecified: Secondary | ICD-10-CM | POA: Insufficient documentation

## 2014-09-13 DIAGNOSIS — I10 Essential (primary) hypertension: Secondary | ICD-10-CM | POA: Diagnosis not present

## 2014-09-13 DIAGNOSIS — Z79899 Other long term (current) drug therapy: Secondary | ICD-10-CM | POA: Diagnosis not present

## 2014-09-13 DIAGNOSIS — E119 Type 2 diabetes mellitus without complications: Secondary | ICD-10-CM

## 2014-09-13 DIAGNOSIS — R0602 Shortness of breath: Secondary | ICD-10-CM

## 2014-09-13 DIAGNOSIS — R609 Edema, unspecified: Secondary | ICD-10-CM

## 2014-09-14 ENCOUNTER — Other Ambulatory Visit: Payer: Self-pay

## 2014-09-14 ENCOUNTER — Telehealth: Payer: Self-pay | Admitting: Family Medicine

## 2014-09-14 DIAGNOSIS — I10 Essential (primary) hypertension: Secondary | ICD-10-CM

## 2014-09-14 DIAGNOSIS — E859 Amyloidosis, unspecified: Secondary | ICD-10-CM

## 2014-09-14 DIAGNOSIS — E119 Type 2 diabetes mellitus without complications: Secondary | ICD-10-CM

## 2014-09-14 NOTE — Telephone Encounter (Signed)
-----   Message from Ellamae Sia sent at 09/09/2014 10:09 AM EDT ----- Regarding: Lab orders for Thursday, 5.12.16 Patient is scheduled for CPX labs, please order future labs, Thanks , Karna Christmas

## 2014-09-15 ENCOUNTER — Other Ambulatory Visit (INDEPENDENT_AMBULATORY_CARE_PROVIDER_SITE_OTHER): Payer: Medicare Other

## 2014-09-15 DIAGNOSIS — E119 Type 2 diabetes mellitus without complications: Secondary | ICD-10-CM | POA: Diagnosis not present

## 2014-09-15 DIAGNOSIS — I10 Essential (primary) hypertension: Secondary | ICD-10-CM

## 2014-09-15 LAB — COMPREHENSIVE METABOLIC PANEL
ALK PHOS: 463 U/L — AB (ref 39–117)
ALT: 16 U/L (ref 0–35)
AST: 39 U/L — AB (ref 0–37)
Albumin: 1.6 g/dL — ABNORMAL LOW (ref 3.5–5.2)
BUN: 35 mg/dL — ABNORMAL HIGH (ref 6–23)
CO2: 38 mEq/L — ABNORMAL HIGH (ref 19–32)
CREATININE: 1.54 mg/dL — AB (ref 0.40–1.20)
Calcium: 8.4 mg/dL (ref 8.4–10.5)
Chloride: 94 mEq/L — ABNORMAL LOW (ref 96–112)
GFR: 43.37 mL/min — ABNORMAL LOW (ref >60.00)
Glucose, Bld: 115 mg/dL — ABNORMAL HIGH (ref 70–99)
POTASSIUM: 3.3 meq/L — AB (ref 3.5–5.1)
SODIUM: 136 meq/L (ref 135–145)
Total Bilirubin: 0.6 mg/dL (ref 0.2–1.2)
Total Protein: 4.7 g/dL — ABNORMAL LOW (ref 6.0–8.3)

## 2014-09-15 LAB — LIPID PANEL
CHOL/HDL RATIO: 12
Cholesterol: 493 mg/dL — ABNORMAL HIGH (ref 0–200)
HDL: 42.1 mg/dL (ref >39.00)
LDL CALC: 421 mg/dL — AB (ref 0–99)
NonHDL: 450.9
Triglycerides: 151 mg/dL — ABNORMAL HIGH (ref 0.0–149.0)
VLDL: 30.2 mg/dL (ref 0.0–40.0)

## 2014-09-15 LAB — CBC WITH DIFFERENTIAL/PLATELET
HEMATOCRIT: 34.6 % — AB (ref 36.0–46.0)
Hemoglobin: 11.4 g/dL — ABNORMAL LOW (ref 12.0–15.0)
MCHC: 33.1 g/dL (ref 30.0–36.0)
MCV: 86.6 fl (ref 78.0–100.0)
Platelets: 374 10*3/uL (ref 150.0–400.0)
RBC: 3.99 Mil/uL (ref 3.87–5.11)
RDW: 19.6 % — AB (ref 11.5–15.5)
WBC: 7.6 10*3/uL (ref 4.0–10.5)

## 2014-09-15 LAB — HEMOGLOBIN A1C: HEMOGLOBIN A1C: 6.5 % (ref 4.6–6.5)

## 2014-09-15 LAB — TSH: TSH: 3.5 u[IU]/mL (ref 0.35–4.50)

## 2014-09-16 NOTE — Progress Notes (Signed)
[place]  [phone]) [phone] Fax:(336) [phone]  ID: Sherlie Ban OB: 01/07/1951  MR#: [id number]  PXT#:[id number]  Patient Care Team: Abner Greenspan, MD as PCP - General Lavonia Dana, MD as Consulting Physician (Nephrology)  CHIEF COMPLAINT:  Chief Complaint  Patient presents with  . Follow-up    amyloidosis/Discuss bone marrow biopsy results    INTERVAL HISTORY: Patient returns to clinic to discuss her imaging, bone marrow biopsy results, and possible treatment planning. She continues to have significant lower extremity peripheral edema that is uncomfortable, but otherwise feels well. She has no recent fevers or illnesses. She denies any pain. She has no neurologic complaints. She denies any chest pain or shortness of breath. She has no nausea, vomiting, constipation, or diarrhea. Patient offers no further specific complaints.   REVIEW OF SYSTEMS:   Review of Systems  Constitutional: Positive for malaise/fatigue.  Respiratory: Positive for shortness of breath.   Cardiovascular: Positive for leg swelling.  Neurological: Positive for weakness.    As per HPI. Otherwise, a complete review of systems is negatve.  PAST MEDICAL HISTORY: Past Medical History  Diagnosis Date  . HTN (hypertension)   . Diabetes mellitus type II   . Obesity   . Arthritis   . Chronic kidney disease   . Amyloidosis     PAST SURGICAL HISTORY: Past Surgical History  Procedure Laterality Date  . Total abdominal hysterectomy  10/1999    fibroids  . Combined abdominoplasty and liposuction  07/2000  . Total knee arthroplasty Right 09/28/2013    Procedure: RIGHT TOTAL KNEE ARTHROPLASTY;  Surgeon: Mauri Pole, MD;  Location: [facility];  Service: Orthopedics;  Laterality: Right;    FAMILY HISTORY Family History  Problem Relation Age of Onset  . Arthritis Mother   . Hypertension Mother   . Cancer Mother     breast, lung  . Diabetes Mother   . Hypertension Father          ADVANCED DIRECTIVES:    HEALTH MAINTENANCE: History  Substance Use Topics  . Smoking status: Never Smoker   . Smokeless tobacco: Never Used  . Alcohol Use: No     Colonoscopy:  PAP:  Bone density:  Lipid panel:  No Known Allergies  Current Outpatient Prescriptions  Medication Sig Dispense Refill  . metolazone (ZAROXOLYN) 5 MG tablet Take 5 mg by mouth daily.    . midodrine (PROAMATINE) 10 MG tablet Take 10 mg by mouth 3 (three) times daily.    . naftifine (NAFTIN) 1 % cream Apply topically daily. To affected areas/feet (Patient taking differently: Apply 1 application topically daily as needed (affected areas on feet.). ) 90 g 1  . potassium chloride SA (K-DUR,KLOR-CON) 20 MEQ tablet Take 1 tablet (20 mEq total) by mouth daily. 90 tablet 3  . torsemide (DEMADEX) 20 MG tablet Take 20 mg by mouth 3 (three) times daily.    . furosemide (LASIX) 40 MG tablet Take 1 tablet (40 mg total) by mouth daily. (Patient not taking: Reported on 09/02/2014) 30 tablet 0  . metFORMIN (GLUCOPHAGE) 1000 MG tablet TAKE 1 TABLET BY MOUTH TWICE A DAY WITH MEAL (Patient not taking: Reported on 09/02/2014) 60 tablet 5   No current facility-administered medications for this visit.    OBJECTIVE: Filed Vitals:   09/13/14 1301  BP: 96/69  Pulse: 86  Temp: 95.2 F (35.1 C)  Resp: 16     Body mass index is 39.88 kg/(m^2).    ECOG FS:0 - Asymptomatic  General: Well-developed, well-nourished, no acute distress. Eyes: anicteric sclera. Lungs: Clear to auscultation bilaterally. Heart: Regular rate and rhythm. No rubs, murmurs, or gallops. Abdomen: Soft, nontender, nondistended. No organomegaly noted, normoactive bowel sounds. Musculoskeletal: 2-3+ bilateral lower extremity edema. Neuro: Alert, answering all questions appropriately. Cranial nerves grossly intact. Skin: No rashes or petechiae noted. Psych: Normal affect.    LAB RESULTS:  Lab Results  Component Value Date   NA 136 09/15/2014    K 3.3* 09/15/2014   CL 94* 09/15/2014   CO2 38* 09/15/2014   GLUCOSE 115* 09/15/2014   BUN 35* 09/15/2014   CREATININE 1.54* 09/15/2014   CALCIUM 8.4 09/15/2014   PROT 4.7* 09/15/2014   ALBUMIN 1.6* 09/15/2014   AST 39* 09/15/2014   ALT 16 09/15/2014   ALKPHOS 463* 09/15/2014   BILITOT 0.6 09/15/2014   GFRNONAA 33* 09/08/2014   GFRAA 39* 09/08/2014    Lab Results  Component Value Date   WBC 7.6 09/15/2014   NEUTROABS 5.3 09/08/2014   HGB 11.4* 09/15/2014   HCT 34.6* 09/15/2014   MCV 86.6 09/15/2014   PLT 374.0 09/15/2014     STUDIES: Ct Biopsy  09/08/2014   CLINICAL DATA:  Amyloidosis, nephrotic syndrome, hypertension  EXAM: CT GUIDED RIGHT ILIAC BONE MARROW ASPIRATION AND CORE BIOPSY  Date:  5/5/20165/09/2014 9:32 am  Radiologist:  M. Daryll Brod, MD  Guidance:  CT  FLUOROSCOPY TIME:  NONE.  MEDICATIONS AND MEDICAL HISTORY: 1 MG VERSED, 25 MCG FENTANYL  ANESTHESIA/SEDATION: 15 MINUTES  CONTRAST:  None.  COMPLICATIONS: None  PROCEDURE: Informed consent was obtained from the patient following explanation of the procedure, risks, benefits and alternatives. The patient understands, agrees and consents for the procedure. All questions were addressed. A time out was performed.  The patient was positioned prone and noncontrast localization CT was performed of the pelvis to demonstrate the iliac marrow spaces.  Maximal barrier sterile technique utilized including caps, mask, sterile gowns, sterile gloves, large sterile drape, hand hygiene, and betadine prep.  Under sterile conditions and local anesthesia, an 11 gauge coaxial bone biopsy needle was advanced into the right iliac marrow space. Needle position was confirmed with CT imaging. Initially, bone marrow aspiration was performed. Next, the 11 gauge outer cannula was utilized to obtain a right iliac bone marrow core biopsy. Needle was removed. Hemostasis was obtained with compression. The patient tolerated the procedure well. Samples  were prepared with the cytotechnologist. No immediate complications.  IMPRESSION: CT guided right iliac bone marrow aspiration and core biopsy.   Electronically Signed   By: Jerilynn Mages.  Shick M.D.   On: 09/08/2014 10:43   Ct Chest Abdomen And Pelvis Wo ([facility] Hx)  08/31/2014   CLINICAL DATA:  Amyloidosis. Leg swelling since January. Diabetic. Elevated creatinine.  EXAM: CT CHEST, ABDOMEN AND PELVIS WITHOUT CONTRAST  TECHNIQUE: Multidetector CT imaging of the chest, abdomen and pelvis was performed following the standard protocol without IV contrast.  COMPARISON:  Clinic note of 08/22/2014. Chest radiograph 06/26/2014.  FINDINGS: CT CHEST FINDINGS  Mediastinum/Nodes: No axillary adenopathy. Aortic atherosclerosis. Tortuous descending thoracic aorta. Mild cardiomegaly, without pericardial effusion.  No mediastinal or definite hilar adenopathy, given limitations of unenhanced CT.  Lungs/Pleura: Trace left pleural fluid or thickening. Volume loss at the lung bases anteriorly, greater on the right. There is also bibasilar subsegmental atelectasis or scar.  Musculoskeletal: Lipoma in the right axilla measures 4.6 cm. Bilateral glenohumeral joint osteoarthritis.  CT ABDOMEN AND PELVIS FINDINGS  Hepatobiliary: Hepatomegaly, 20 cm. Small gallstones without acute  cholecystitis or biliary ductal dilatation.  Pancreas: Normal, without mass or ductal dilatation.  Spleen: Normal  Adrenals/Urinary Tract: Normal left adrenal gland. Low-density right adrenal nodule measures 2.4 cm, consistent with an adenoma.  No renal calculi or hydronephrosis.    No bladder calculi.  Stomach/Bowel: Underdistended stomach. Normal colon, appendix, and terminal ileum. Normal small bowel.  Vascular/Lymphatic: Normal caliber of the aorta and branch vessels. No abdominopelvic adenopathy.  Reproductive: Hysterectomy.  No adnexal mass.  Other: Small volume abdominal pelvic ascites. Anasarca about the lower pelvis and imaged upper extremities  Musculoskeletal: 8  mm lucent lesion in the right iliac. Thoracolumbar spondylosis. Prominent disc bulges at L3-4 and L4-5.  IMPRESSION: 1. Isolated subtle nonspecific subcentimeter right iliac lucent lesion. Otherwise, no evidence of multiple myeloma. 2. No adenopathy within the chest, abdomen, or pelvis. 3. Abdominal pelvic ascites and anasarca. Question fluid overload/congestive heart failure. 4. Right adrenal adenoma. 5. Trace left pleural fluid. 6. Cholelithiasis.   Electronically Signed   By: Abigail Miyamoto M.D.   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  PLAN:    1. Amyloidosis: Pathology results reviewed independently. Bone marrow biopsy results with approximately 10% plasma cells consistent with amyloidosis. CT scan results as above reviewed independently. Patient's SIEP and UIEP did not reveal M spike, both kappa and lambda light chains are elevated and serum and urine but the ratio is actually decreased. INR is also elevated. Patient's BNP is mildly elevated, therefore will get a MUGA scan to further evaluate her cardiac function. Patient has evidence of endorgan damage and will benefit from treatment with chemotherapy. Plan to use CyBorD followed by possible ASCT. Patient's daughter-in-law lives in [place], [place] and is considering having patient being treated closer to home. Referral will be made to [facility] for second opinion. Patient is unclear where she would like to take her treatment, therefore a follow-up was scheduled in approximately 2 weeks to initiate cycle 1.  Approximately 30 minutes was spent in discussion and consultation.   Patient expressed understanding and was in agreement with this plan. She also understands that She can call clinic at any time with any questions, concerns, or complaints.   No matching staging information was found for the patient.  Lloyd Huger, MD   09/16/2014 5:11 PM

## 2014-09-19 ENCOUNTER — Telehealth: Payer: Self-pay

## 2014-09-19 LAB — TISSUE HYBRIDIZATION (BONE MARROW)-NCBH

## 2014-09-19 LAB — CHROMOSOME ANALYSIS, BONE MARROW

## 2014-09-19 NOTE — Telephone Encounter (Signed)
Patient has an appointment @ [facility] with Dr. Melba Coon, for 2nd opinion, on 10/11/14 @1 :00.Marland KitchenMarland KitchenEsau Hartman

## 2014-09-19 NOTE — Patient Instructions (Addendum)
Cyclophosphamide injection What is this medicine? CYCLOPHOSPHAMIDE (sye kloe FOSS fa mide) is a chemotherapy drug. It slows the growth of cancer cells. This medicine is used to treat many types of cancer like lymphoma, myeloma, leukemia, breast cancer, and ovarian cancer, to name a few. This medicine may be used for other purposes; ask your health care provider or pharmacist if you have questions. COMMON BRAND NAME(S): Cytoxan, Neosar What should I tell my health care provider before I take this medicine? They need to know if you have any of these conditions: -blood disorders -history of other chemotherapy -infection -kidney disease -liver disease -recent or ongoing radiation therapy -tumors in the bone marrow -an unusual or allergic reaction to cyclophosphamide, other chemotherapy, other medicines, foods, dyes, or preservatives -pregnant or trying to get pregnant -breast-feeding How should I use this medicine? This drug is usually given as an injection into a vein or muscle or by infusion into a vein. It is administered in a hospital or clinic by a specially trained health care professional. Talk to your pediatrician regarding the use of this medicine in children. Special care may be needed. Overdosage: If you think you have taken too much of this medicine contact a poison control center or emergency room at once. NOTE: This medicine is only for you. Do not share this medicine with others. What if I miss a dose? It is important not to miss your dose. Call your doctor or health care professional if you are unable to keep an appointment. What may interact with this medicine? This medicine may interact with the following medications: -amiodarone -amphotericin B -azathioprine -certain antiviral medicines for HIV or AIDS such as protease inhibitors (e.g., indinavir, ritonavir) and zidovudine -certain blood pressure medications such as benazepril, captopril, enalapril, fosinopril,  lisinopril, moexipril, monopril, perindopril, quinapril, ramipril, trandolapril -certain cancer medications such as anthracyclines (e.g., daunorubicin, doxorubicin), busulfan, cytarabine, paclitaxel, pentostatin, tamoxifen, trastuzumab -certain diuretics such as chlorothiazide, chlorthalidone, hydrochlorothiazide, indapamide, metolazone -certain medicines that treat or prevent blood clots like warfarin -certain muscle relaxants such as succinylcholine -cyclosporine -etanercept -indomethacin -medicines to increase blood counts like filgrastim, pegfilgrastim, sargramostim -medicines used as general anesthesia -metronidazole -natalizumab This list may not describe all possible interactions. Give your health care provider a list of all the medicines, herbs, non-prescription drugs, or dietary supplements you use. Also tell them if you smoke, drink alcohol, or use illegal drugs. Some items may interact with your medicine. What should I watch for while using this medicine? Visit your doctor for checks on your progress. This drug may make you feel generally unwell. This is not uncommon, as chemotherapy can affect healthy cells as well as cancer cells. Report any side effects. Continue your course of treatment even though you feel ill unless your doctor tells you to stop. Drink water or other fluids as directed. Urinate often, even at night. In some cases, you may be given additional medicines to help with side effects. Follow all directions for their use. Call your doctor or health care professional for advice if you get a fever, chills or sore throat, or other symptoms of a cold or flu. Do not treat yourself. This drug decreases your body's ability to fight infections. Try to avoid being around people who are sick. This medicine may increase your risk to bruise or bleed. Call your doctor or health care professional if you notice any unusual bleeding. Be careful brushing and flossing your teeth or using a  toothpick because you may get an infection or bleed   more easily. If you have any dental work done, tell your dentist you are receiving this medicine. You may get drowsy or dizzy. Do not drive, use machinery, or do anything that needs mental alertness until you know how this medicine affects you. Do not become pregnant while taking this medicine or for 1 year after stopping it. Women should inform their doctor if they wish to become pregnant or think they might be pregnant. Men should not father a child while taking this medicine and for 4 months after stopping it. There is a potential for serious side effects to an unborn child. Talk to your health care professional or pharmacist for more information. Do not breast-feed an infant while taking this medicine. This medicine may interfere with the ability to have a child. This medicine has caused ovarian failure in some women. This medicine has caused reduced sperm counts in some men. You should talk with your doctor or health care professional if you are concerned about your fertility. If you are going to have surgery, tell your doctor or health care professional that you have taken this medicine. What side effects may I notice from receiving this medicine? Side effects that you should report to your doctor or health care professional as soon as possible: -allergic reactions like skin rash, itching or hives, swelling of the face, lips, or tongue -low blood counts - this medicine may decrease the number of white blood cells, red blood cells and platelets. You may be at increased risk for infections and bleeding. -signs of infection - fever or chills, cough, sore throat, pain or difficulty passing urine -signs of decreased platelets or bleeding - bruising, pinpoint red spots on the skin, black, tarry stools, blood in the urine -signs of decreased red blood cells - unusually weak or tired, fainting spells, lightheadedness -breathing problems -dark  urine -dizziness -palpitations -swelling of the ankles, feet, hands -trouble passing urine or change in the amount of urine -weight gain -yellowing of the eyes or skin Side effects that usually do not require medical attention (report to your doctor or health care professional if they continue or are bothersome): -changes in nail or skin color -hair loss -missed menstrual periods -mouth sores -nausea, vomiting This list may not describe all possible side effects. Call your doctor for medical advice about side effects. You may report side effects to FDA at 1-800-FDA-1088. Where should I keep my medicine? This drug is given in a hospital or clinic and will not be stored at home. NOTE: This sheet is a summary. It may not cover all possible information. If you have questions about this medicine, talk to your doctor, pharmacist, or health care provider.  2015, [employer]. (2012-03-06 16:22:58) Bortezomib injection What is this medicine? BORTEZOMIB (bor TEZ oh mib) is a chemotherapy drug. It slows the growth of cancer cells. This medicine is used to treat multiple myeloma, and certain lymphomas, such as mantle-cell lymphoma. This medicine may be used for other purposes; ask your health care provider or pharmacist if you have questions. COMMON BRAND NAME(S): Velcade What should I tell my health care provider before I take this medicine? They need to know if you have any of these conditions: -diabetes -heart disease -irregular heartbeat -liver disease -on hemodialysis -low blood counts, like low white blood cells, platelets, or hemoglobin -peripheral neuropathy -taking medicine for blood pressure -an unusual or allergic reaction to bortezomib, mannitol, boron, other medicines, foods, dyes, or preservatives -pregnant or trying to get pregnant -breast-feeding How should  I use this medicine? This medicine is for injection into a vein or for injection under the skin. It is given  by a health care professional in a hospital or clinic setting. Talk to your pediatrician regarding the use of this medicine in children. Special care may be needed. Overdosage: If you think you have taken too much of this medicine contact a poison control center or emergency room at once. NOTE: This medicine is only for you. Do not share this medicine with others. What if I miss a dose? It is important not to miss your dose. Call your doctor or health care professional if you are unable to keep an appointment. What may interact with this medicine? This medicine may interact with the following medications: -ketoconazole -rifampin -ritonavir -St. John's Wort This list may not describe all possible interactions. Give your health care provider a list of all the medicines, herbs, non-prescription drugs, or dietary supplements you use. Also tell them if you smoke, drink alcohol, or use illegal drugs. Some items may interact with your medicine. What should I watch for while using this medicine? Visit your doctor for checks on your progress. This drug may make you feel generally unwell. This is not uncommon, as chemotherapy can affect healthy cells as well as cancer cells. Report any side effects. Continue your course of treatment even though you feel ill unless your doctor tells you to stop. You may get drowsy or dizzy. Do not drive, use machinery, or do anything that needs mental alertness until you know how this medicine affects you. Do not stand or sit up quickly, especially if you are an older patient. This reduces the risk of dizzy or fainting spells. In some cases, you may be given additional medicines to help with side effects. Follow all directions for their use. Call your doctor or health care professional for advice if you get a fever, chills or sore throat, or other symptoms of a cold or flu. Do not treat yourself. This drug decreases your body's ability to fight infections. Try to avoid being  around people who are sick. This medicine may increase your risk to bruise or bleed. Call your doctor or health care professional if you notice any unusual bleeding. You may need blood work done while you are taking this medicine. In some patients, this medicine may cause a serious brain infection that may cause death. If you have any problems seeing, thinking, speaking, walking, or standing, tell your doctor right away. If you cannot reach your doctor, urgently seek other source of medical care. Do not become pregnant while taking this medicine. Women should inform their doctor if they wish to become pregnant or think they might be pregnant. There is a potential for serious side effects to an unborn child. Talk to your health care professional or pharmacist for more information. Do not breast-feed an infant while taking this medicine. Check with your doctor or health care professional if you get an attack of severe diarrhea, nausea and vomiting, or if you sweat a lot. The loss of too much body fluid can make it dangerous for you to take this medicine. What side effects may I notice from receiving this medicine? Side effects that you should report to your doctor or health care professional as soon as possible: -allergic reactions like skin rash, itching or hives, swelling of the face, lips, or tongue -breathing problems -changes in hearing -changes in vision -fast, irregular heartbeat -feeling faint or lightheaded, falls -pain, tingling, numbness in  the hands or feet -right upper belly pain -seizures -swelling of the ankles, feet, hands -unusual bleeding or bruising -unusually weak or tired -vomiting -yellowing of the eyes or skin Side effects that usually do not require medical attention (report to your doctor or health care professional if they continue or are bothersome): -changes in emotions or moods -constipation -diarrhea -loss of appetite -headache -irritation at site where  injected -nausea This list may not describe all possible side effects. Call your doctor for medical advice about side effects. You may report side effects to FDA at 1-800-FDA-1088. Where should I keep my medicine? This drug is given in a hospital or clinic and will not be stored at home. NOTE: This sheet is a summary. It may not cover all possible information. If you have questions about this medicine, talk to your doctor, pharmacist, or health care provider.  2015, [employer]. (2013-02-15 12:46:32) Dexamethasone injection What is this medicine? DEXAMETHASONE (dex a METH a sone) is a corticosteroid. It is used to treat inflammation of the skin, joints, lungs, and other organs. Common conditions treated include asthma, allergies, and arthritis. It is also used for other conditions, like blood disorders and diseases of the adrenal glands. This medicine may be used for other purposes; ask your health care provider or pharmacist if you have questions. COMMON BRAND NAME(S): Decadron, Solurex What should I tell my health care provider before I take this medicine? They need to know if you have any of these conditions: -blood clotting problems -Cushing's syndrome -diabetes -glaucoma -heart problems or disease -high blood pressure -infection like herpes, measles, tuberculosis, or chickenpox -kidney disease -liver disease -mental problems -myasthenia gravis -osteoporosis -previous heart attack -seizures -stomach, ulcer or intestine disease including colitis and diverticulitis -thyroid problem -an unusual or allergic reaction to dexamethasone, corticosteroids, other medicines, lactose, foods, dyes, or preservatives -pregnant or trying to get pregnant -breast-feeding How should I use this medicine? This medicine is for injection into a muscle, joint, lesion, soft tissue, or vein. It is given by a health care professional in a hospital or clinic setting. Talk to your pediatrician  regarding the use of this medicine in children. Special care may be needed. Overdosage: If you think you have taken too much of this medicine contact a poison control center or emergency room at once. NOTE: This medicine is only for you. Do not share this medicine with others. What if I miss a dose? This may not apply. If you are having a series of injections over a prolonged period, try not to miss an appointment. Call your doctor or health care professional to reschedule if you are unable to keep an appointment. What may interact with this medicine? Do not take this medicine with any of the following medications: -mifepristone, RU-486 -vaccines This medicine may also interact with the following medications: -amphotericin B -antibiotics like clarithromycin, erythromycin, and troleandomycin -aspirin and aspirin-like drugs -barbiturates like phenobarbital -carbamazepine -cholestyramine -cholinesterase inhibitors like donepezil, galantamine, rivastigmine, and tacrine -cyclosporine -digoxin -diuretics -ephedrine -female hormones, like estrogens or progestins and birth control pills -indinavir -isoniazid -ketoconazole -medicines for diabetes -medicines that improve muscle tone or strength for conditions like myasthenia gravis -NSAIDs, medicines for pain and inflammation, like ibuprofen or naproxen -phenytoin -rifampin -thalidomide -warfarin This list may not describe all possible interactions. Give your health care provider a list of all the medicines, herbs, non-prescription drugs, or dietary supplements you use. Also tell them if you smoke, drink alcohol, or use illegal drugs. Some items may interact with  your medicine. What should I watch for while using this medicine? Your condition will be monitored carefully while you are receiving this medicine. If you are taking this medicine for a long time, carry an identification card with your name and address, the type and dose of your  medicine, and your doctor's name and address. This medicine may increase your risk of getting an infection. Stay away from people who are sick. Tell your doctor or health care professional if you are around anyone with measles or chickenpox. Talk to your health care provider before you get any vaccines that you take this medicine. If you are going to have surgery, tell your doctor or health care professional that you have taken this medicine within the last twelve months. Ask your doctor or health care professional about your diet. You may need to lower the amount of salt you eat. The medicine can increase your blood sugar. If you are a diabetic check with your doctor if you need help adjusting the dose of your diabetic medicine. What side effects may I notice from receiving this medicine? Side effects that you should report to your doctor or health care professional as soon as possible: -allergic reactions like skin rash, itching or hives, swelling of the face, lips, or tongue -black or tarry stools -change in the amount of urine -changes in vision -confusion, excitement, restlessness, a false sense of well-being -fever, sore throat, sneezing, cough, or other signs of infection, wounds that will not heal -hallucinations -increased thirst -mental depression, mood swings, mistaken feelings of self importance or of being mistreated -pain in hips, back, ribs, arms, shoulders, or legs -pain, redness, or irritation at the injection site -redness, blistering, peeling or loosening of the skin, including inside the mouth -rounding out of face -swelling of feet or lower legs -unusual bleeding or bruising -unusual tired or weak -wounds that do not heal Side effects that usually do not require medical attention (report to your doctor or health care professional if they continue or are bothersome): -diarrhea or constipation -change in taste -headache -nausea, vomiting -skin problems, acne, thin and  shiny skin -touble sleeping -unusual growth of hair on the face or body -weight gain This list may not describe all possible side effects. Call your doctor for medical advice about side effects. You may report side effects to FDA at 1-800-FDA-1088. Where should I keep my medicine? This drug is given in a hospital or clinic and will not be stored at home. NOTE: This sheet is a summary. It may not cover all possible information. If you have questions about this medicine, talk to your doctor, pharmacist, or health care provider.  2015, [employer]. (2007-08-13 14:04:12)

## 2014-09-20 ENCOUNTER — Encounter: Payer: Self-pay | Admitting: Family Medicine

## 2014-09-20 ENCOUNTER — Ambulatory Visit
Admission: RE | Admit: 2014-09-20 | Discharge: 2014-09-20 | Disposition: A | Discharge status: Home / Self Care | Payer: Medicare Other | Source: Ambulatory Visit | Attending: Oncology | Admitting: Oncology

## 2014-09-20 ENCOUNTER — Ambulatory Visit (INDEPENDENT_AMBULATORY_CARE_PROVIDER_SITE_OTHER): Payer: Medicare Other | Admitting: Family Medicine

## 2014-09-20 VITALS — BP 110/64 | HR 89 | Temp 97.4°F | Ht 61.0 in | Wt 216.2 lb

## 2014-09-20 DIAGNOSIS — E119 Type 2 diabetes mellitus without complications: Secondary | ICD-10-CM

## 2014-09-20 DIAGNOSIS — N049 Nephrotic syndrome with unspecified morphologic changes: Secondary | ICD-10-CM | POA: Diagnosis not present

## 2014-09-20 DIAGNOSIS — Z0000 Encounter for general adult medical examination without abnormal findings: Secondary | ICD-10-CM | POA: Insufficient documentation

## 2014-09-20 DIAGNOSIS — I10 Essential (primary) hypertension: Secondary | ICD-10-CM | POA: Diagnosis not present

## 2014-09-20 DIAGNOSIS — R0602 Shortness of breath: Secondary | ICD-10-CM | POA: Diagnosis not present

## 2014-09-20 DIAGNOSIS — E669 Obesity, unspecified: Secondary | ICD-10-CM

## 2014-09-20 DIAGNOSIS — Z23 Encounter for immunization: Secondary | ICD-10-CM

## 2014-09-20 MED ORDER — POLYETHYLENE GLYCOL 3350 17 GM/SCOOP PO POWD: 17.0000 g | Freq: Every day | ORAL | Status: AC | PRN

## 2014-09-20 MED ORDER — TECHNETIUM TC 99M-LABELED RED BLOOD CELLS IV KIT
22.0000 | PACK | Freq: Once | INTRAVENOUS | Status: AC | PRN
  Administered 2014-09-20: 21.931 via INTRAVENOUS

## 2014-09-20 NOTE — Assessment & Plan Note (Signed)
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

## 2014-09-20 NOTE — Assessment & Plan Note (Signed)
Pt has actually lost wt despite gaining fluid wt  Continues low fat and low sugar diet

## 2014-09-20 NOTE — Assessment & Plan Note (Addendum)
Very well controlled now off all meds/no longer on metformin due to renal failure Less PO intake due to her ascites- but she does try to take in the right foods  Cholesterol is way up - suspect this is falsely elevated by current renal / protein imbalance issues however (not a problem before) Exercise as tol Continue to follow

## 2014-09-20 NOTE — Assessment & Plan Note (Signed)
Pt is actually currently struggling with orthostatic hypotension from diuretics with nephrotic syndrome Followed by renal

## 2014-09-20 NOTE — Progress Notes (Signed)
Subjective:    Patient ID: [name], female    DOB: 27-Feb-1951, 64 y.o.   MRN: [id number]  HPI Here for annual medicare wellness visit as well as chronic/acute medical problems   I have personally reviewed the Medicare Annual Wellness questionnaire and have noted 1. The patient's medical and social history 2. Their use of alcohol, tobacco or illicit drugs 3. Their current medications and supplements 4. The patient's functional ability including ADL's, fall risks, home safety risks and hearing or visual             impairment. 5. Diet and physical activities 6. Evidence for depression or mood disorders  The patients weight, height, BMI have been recorded in the chart and visual acuity is per eye clinic.  I have made referrals, counseling and provided education to the patient based review of the above and I have provided the pt with a written personalized care plan for preventive services. Reviewed and updated provider list, see scanned forms.  See scanned forms.  Routine anticipatory guidance given to patient.  See health maintenance. Colon cancer screening 10/11 - thinks 10 year recall  Breast cancer screening 2/15 - has not had one yet- wants to hold off for now in light of renal issues  Self breast exam no lumps  Flu vaccine 11/15 Tetanus vaccine  4/11 Pneumovax 8/12 , - will do prevnar  Zoster vaccine 12/13  Gyn- hysterectomy and no troubles  Advance directive-does not have a living will and POA  - given materials to work on  Cognitive function addressed- see scanned forms- and if abnormal then additional documentation follows.  Memory is allright   PMH and SH reviewed  Meds, vitals, and allergies reviewed.   ROS: See HPI.  Otherwise negative.      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 -considering a bone  marrow transplant  Trying to prevent need for dialysis  Feeling water logged and heavy - hard to get around  Gets cramps in her back - uses ice   Wt is up 6 lb  bmi 40 In obese range -but has fluid retention marked as well   Hypokalemia Lab Results  Component Value Date   K 3.3* 09/15/2014    Renal fxn stable Lab Results  Component Value Date   CREATININE 1.54* 09/15/2014   BUN 35* 09/15/2014   NA 136 09/15/2014   K 3.3* 09/15/2014   CL 94* 09/15/2014   CO2 38* 09/15/2014   Albumin is low at 1.6 Cr is stable  On zaroxolyn and demadex  Struggling to keep bp high enough   Stable anemia Lab Results  Component Value Date   WBC 7.6 09/15/2014   HGB 11.4* 09/15/2014   HCT 34.6* 09/15/2014   MCV 86.6 09/15/2014   PLT 374.0 09/15/2014    DM2 stable Lab Results  Component Value Date   HGBA1C 6.5 09/15/2014  even off metformin for 1 mo - but still stable    Opth utd exam  Very high lipids   Lab Results  Component Value Date   CHOL 493* 09/15/2014   CHOL 158 06/07/2013   CHOL 151 02/26/2012   Lab Results  Component Value Date   HDL 42.10 09/15/2014   HDL 54.30 06/07/2013   HDL 46.20 02/26/2012   Lab Results  Component Value  Date   LDLCALC 421* 09/15/2014   LDLCALC 88 06/07/2013   LDLCALC 87 02/26/2012   Lab Results  Component Value Date   TRIG 151.0* 09/15/2014   TRIG 81.0 06/07/2013   TRIG 88.0 02/26/2012   Lab Results  Component Value Date   CHOLHDL 12 09/15/2014   CHOLHDL 3 06/07/2013   CHOLHDL 3 02/26/2012   No results found for: LDLDIRECT   She eats an egg every day  Suspect falsely elevated from other labs  Otherwise is very very good with diet   Lab Results  Component Value Date   TSH 3.50 09/15/2014     Patient Active Problem List   Diagnosis Date Noted  . Encounter for Medicare annual wellness exam 09/20/2014  . Amyloidosis   . Nephrotic syndrome 06/28/2014  . Renal insufficiency 06/28/2014  . Acute renal failure  06/26/2014  . Dehydration 06/26/2014  . Orthostatic hypotension 06/26/2014  . Positive D dimer 06/26/2014  . Bilateral lower extremity edema 06/26/2014  . Hypokalemia 06/26/2014  . Dyspnea 06/26/2014  . Pedal edema 06/07/2014  . Tinea pedis 06/07/2014  . Shortness of breath 01/14/2014  . Right arm pain 01/11/2014  . Exercise intolerance 01/11/2014  . S/P right TKA 09/28/2013  . Deformity of toenail 06/14/2013  . Other screening mammogram 12/18/2010  . Routine general medical examination at a health care facility 12/08/2010  . Osteoarthritis of knee 09/13/2010  . Knee pain, bilateral 09/11/2010  . Obesity   . Diabetes type 2, controlled 02/26/2010  . Essential hypertension 08/29/2009   Past Medical History  Diagnosis Date  . HTN (hypertension)   . Diabetes mellitus type II   . Obesity   . Arthritis   . Chronic kidney disease   . Amyloidosis    Past Surgical History  Procedure Laterality Date  . Total abdominal hysterectomy  10/1999    fibroids  . Combined abdominoplasty and liposuction  07/2000  . Total knee arthroplasty Right 09/28/2013    Procedure: RIGHT TOTAL KNEE ARTHROPLASTY;  Surgeon: Mauri Pole, MD;  Location: [facility];  Service: Orthopedics;  Laterality: Right;   History  Substance Use Topics  . Smoking status: Never Smoker   . Smokeless tobacco: Never Used  . Alcohol Use: No   Family History  Problem Relation Age of Onset  . Arthritis Mother   . Hypertension Mother   . Cancer Mother     breast, lung  . Diabetes Mother   . Hypertension Father    No Known Allergies Current Outpatient Prescriptions on File Prior to Visit  Medication Sig Dispense Refill  . metolazone (ZAROXOLYN) 5 MG tablet Take 5 mg by mouth daily.    . midodrine (PROAMATINE) 10 MG tablet Take 10 mg by mouth 3 (three) times daily.    . naftifine (NAFTIN) 1 % cream Apply topically daily. To affected areas/feet (Patient taking differently: Apply 1 application topically daily as needed  (affected areas on feet.). ) 90 g 1  . potassium chloride SA (K-DUR,KLOR-CON) 20 MEQ tablet Take 1 tablet (20 mEq total) by mouth daily. 90 tablet 3  . torsemide (DEMADEX) 20 MG tablet Take 20 mg by mouth 3 (three) times daily.     No current facility-administered medications on file prior to visit.     Review of Systems Review of Systems  Constitutional: Negative for fever, appetite change, and unexpected weight change. pos for fatigue  Eyes: Negative for pain and visual disturbance.  Respiratory: Negative for cough and shortness of breath.  Cardiovascular: Negative for cp or palpitations    Gastrointestinal: Negative for nausea, diarrhea and constipation.  Genitourinary: Negative for urgency and frequency. pos for severe edema in abd and legs causing difficulty in mobility Skin: Negative for pallor or rash   Neurological: Negative for , , numbness and headaches. pos for generalized weakness pos for light headedness if bp gets low  Hematological: Negative for adenopathy. Does not bruise/bleed easily.  Psychiatric/Behavioral: Negative for dysphoric mood. The patient is not nervous/anxious.         Objective:   Physical Exam  Constitutional: She appears well-developed and well-nourished. No distress.  Obese but with notable wt loss in upper body  Frail appearing   HENT:  Head: Normocephalic and atraumatic.  Right Ear: External ear normal.  Left Ear: External ear normal.  Mouth/Throat: Oropharynx is clear and moist.  Eyes: Conjunctivae and EOM are normal. Pupils are equal, round, and reactive to light. No scleral icterus.  Neck: Normal range of motion. Neck supple. No JVD present. Carotid bruit is not present. No thyromegaly present.  Cardiovascular: Normal rate, regular rhythm, normal heart sounds and intact distal pulses.  Exam reveals no gallop.   Pulmonary/Chest: Effort normal and breath sounds normal. No respiratory distress. She has no wheezes. She exhibits no tenderness.    Abdominal: Soft. Bowel sounds are normal. She exhibits ascites. She exhibits no distension, no abdominal bruit and no mass. There is no hepatosplenomegaly. There is no tenderness. There is no CVA tenderness.  Genitourinary: No breast swelling, tenderness, discharge or bleeding.  Musculoskeletal: She exhibits no edema or tenderness.  Poor rom knees   Severe pedal edema -unable to get on table   Lymphadenopathy:    She has no cervical adenopathy.  Neurological: She is alert. She has normal reflexes. No cranial nerve deficit. She exhibits normal muscle tone. Coordination normal.  Skin: Skin is warm and dry. No rash noted. No erythema. No pallor.  Psychiatric: She has a normal mood and affect.          Assessment & Plan:   Problem List Items Addressed This Visit    Diabetes type 2, controlled    Very well controlled now off all meds/no longer on metformin due to renal failure Less PO intake due to her ascites- but she does try to take in the right foods  Cholesterol is way up - suspect this is falsely elevated by current renal / protein imbalance issues however (not a problem before) Exercise as tol Continue to follow       Encounter for Medicare annual wellness exam    Reviewed health habits including diet and exercise and skin cancer prevention Reviewed appropriate screening tests for age  Also reviewed health mt list, fam hx and immunization status , as well as social and family history   See HPI Labs reviewed In the midst of tx for amyloidosis and kidney failure Counseled on fall prev and also adv care planning (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) prevnar vaccine today  Pt wants to put off mammogram and dexa another year while prep for chemo and tx for renal failure       Essential hypertension - Primary    Pt is actually currently struggling with orthostatic hypotension from diuretics with nephrotic syndrome Followed by renal      Nephrotic syndrome     From amyloidosis- seeing renal and oncol (upcoming chemo for amyloid)  Renal failure- watched closely  Labs fairly stable today  May end up needing dialysis  On diuretics as tolerated watching out for hypotension        Obesity    Pt has actually lost wt despite gaining fluid wt  Continues low fat and low sugar diet        Other Visit Diagnoses    Need for vaccination with 13-polyvalent pneumococcal conjugate vaccine        Relevant Orders    Pneumococcal conjugate vaccine 13-valent (Completed)

## 2014-09-20 NOTE — Assessment & Plan Note (Signed)
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

## 2014-09-20 NOTE — Patient Instructions (Addendum)
prevnar shot today (pneumonia vaccine)  Please work on advance directive - the materials I gave you  Take care of yourself  Continue follow up with oncology and nephrology as planned  Labs look fairly stable - cholesterol went up however I think this may not be fully accurate- we will continue to follow it When you decide to get a mammogram- just make your appointment  We will discuss a bone density test in the future

## 2014-09-20 NOTE — Progress Notes (Signed)
Pre visit review using our clinic review tool, if applicable. No additional management support is needed unless otherwise documented below in the visit note. 

## 2014-09-22 ENCOUNTER — Inpatient Hospital Stay: Payer: Medicare Other

## 2014-09-28 ENCOUNTER — Emergency Department: Payer: Medicare Other

## 2014-09-28 ENCOUNTER — Inpatient Hospital Stay
Admission: EM | Admit: 2014-09-28 | Discharge: 2014-11-04 | DRG: 853 | Disposition: E | Payer: Medicare Other | Attending: Internal Medicine | Admitting: Internal Medicine

## 2014-09-28 ENCOUNTER — Ambulatory Visit: Payer: Medicare Other | Admitting: Oncology

## 2014-09-28 ENCOUNTER — Ambulatory Visit: Payer: Medicare Other

## 2014-09-28 ENCOUNTER — Other Ambulatory Visit: Payer: Self-pay | Admitting: Oncology

## 2014-09-28 ENCOUNTER — Inpatient Hospital Stay: Payer: Medicare Other

## 2014-09-28 ENCOUNTER — Encounter: Payer: Self-pay | Admitting: Emergency Medicine

## 2014-09-28 ENCOUNTER — Other Ambulatory Visit: Payer: Medicare Other

## 2014-09-28 DIAGNOSIS — Z9911 Dependence on respirator [ventilator] status: Secondary | ICD-10-CM

## 2014-09-28 DIAGNOSIS — G9340 Encephalopathy, unspecified: Secondary | ICD-10-CM | POA: Diagnosis present

## 2014-09-28 DIAGNOSIS — J9601 Acute respiratory failure with hypoxia: Secondary | ICD-10-CM | POA: Insufficient documentation

## 2014-09-28 DIAGNOSIS — I120 Hypertensive chronic kidney disease with stage 5 chronic kidney disease or end stage renal disease: Secondary | ICD-10-CM | POA: Diagnosis present

## 2014-09-28 DIAGNOSIS — R34 Anuria and oliguria: Secondary | ICD-10-CM | POA: Diagnosis present

## 2014-09-28 DIAGNOSIS — Z4659 Encounter for fitting and adjustment of other gastrointestinal appliance and device: Secondary | ICD-10-CM

## 2014-09-28 DIAGNOSIS — N170 Acute kidney failure with tubular necrosis: Secondary | ICD-10-CM | POA: Diagnosis present

## 2014-09-28 DIAGNOSIS — L03314 Cellulitis of groin: Secondary | ICD-10-CM | POA: Diagnosis present

## 2014-09-28 DIAGNOSIS — D638 Anemia in other chronic diseases classified elsewhere: Secondary | ICD-10-CM | POA: Diagnosis present

## 2014-09-28 DIAGNOSIS — Z90710 Acquired absence of both cervix and uterus: Secondary | ICD-10-CM | POA: Diagnosis not present

## 2014-09-28 DIAGNOSIS — N183 Chronic kidney disease, stage 3 (moderate): Secondary | ICD-10-CM | POA: Diagnosis present

## 2014-09-28 DIAGNOSIS — J690 Pneumonitis due to inhalation of food and vomit: Secondary | ICD-10-CM | POA: Diagnosis present

## 2014-09-28 DIAGNOSIS — E8809 Other disorders of plasma-protein metabolism, not elsewhere classified: Secondary | ICD-10-CM | POA: Diagnosis present

## 2014-09-28 DIAGNOSIS — K567 Ileus, unspecified: Secondary | ICD-10-CM | POA: Diagnosis present

## 2014-09-28 DIAGNOSIS — Z801 Family history of malignant neoplasm of trachea, bronchus and lung: Secondary | ICD-10-CM | POA: Diagnosis not present

## 2014-09-28 DIAGNOSIS — N19 Unspecified kidney failure: Secondary | ICD-10-CM

## 2014-09-28 DIAGNOSIS — E8770 Fluid overload, unspecified: Secondary | ICD-10-CM | POA: Diagnosis present

## 2014-09-28 DIAGNOSIS — T82514A Breakdown (mechanical) of infusion catheter, initial encounter: Secondary | ICD-10-CM | POA: Diagnosis present

## 2014-09-28 DIAGNOSIS — K7200 Acute and subacute hepatic failure without coma: Secondary | ICD-10-CM | POA: Diagnosis present

## 2014-09-28 DIAGNOSIS — R4182 Altered mental status, unspecified: Secondary | ICD-10-CM | POA: Diagnosis not present

## 2014-09-28 DIAGNOSIS — K319 Disease of stomach and duodenum, unspecified: Secondary | ICD-10-CM | POA: Diagnosis present

## 2014-09-28 DIAGNOSIS — A419 Sepsis, unspecified organism: Secondary | ICD-10-CM

## 2014-09-28 DIAGNOSIS — Z6830 Body mass index (BMI) 30.0-30.9, adult: Secondary | ICD-10-CM

## 2014-09-28 DIAGNOSIS — R0602 Shortness of breath: Secondary | ICD-10-CM

## 2014-09-28 DIAGNOSIS — E872 Acidosis: Secondary | ICD-10-CM | POA: Diagnosis present

## 2014-09-28 DIAGNOSIS — Z7401 Bed confinement status: Secondary | ICD-10-CM

## 2014-09-28 DIAGNOSIS — Z515 Encounter for palliative care: Secondary | ICD-10-CM

## 2014-09-28 DIAGNOSIS — M6289 Other specified disorders of muscle: Secondary | ICD-10-CM | POA: Diagnosis present

## 2014-09-28 DIAGNOSIS — N390 Urinary tract infection, site not specified: Secondary | ICD-10-CM | POA: Diagnosis present

## 2014-09-28 DIAGNOSIS — T859XXA Unspecified complication of internal prosthetic device, implant and graft, initial encounter: Secondary | ICD-10-CM | POA: Diagnosis present

## 2014-09-28 DIAGNOSIS — R509 Fever, unspecified: Secondary | ICD-10-CM | POA: Diagnosis not present

## 2014-09-28 DIAGNOSIS — Z452 Encounter for adjustment and management of vascular access device: Secondary | ICD-10-CM

## 2014-09-28 DIAGNOSIS — Z833 Family history of diabetes mellitus: Secondary | ICD-10-CM

## 2014-09-28 DIAGNOSIS — K7290 Hepatic failure, unspecified without coma: Secondary | ICD-10-CM | POA: Diagnosis not present

## 2014-09-28 DIAGNOSIS — Z9889 Other specified postprocedural states: Secondary | ICD-10-CM | POA: Diagnosis not present

## 2014-09-28 DIAGNOSIS — Z87441 Personal history of nephrotic syndrome: Secondary | ICD-10-CM | POA: Diagnosis not present

## 2014-09-28 DIAGNOSIS — Z8249 Family history of ischemic heart disease and other diseases of the circulatory system: Secondary | ICD-10-CM

## 2014-09-28 DIAGNOSIS — N186 End stage renal disease: Secondary | ICD-10-CM | POA: Diagnosis present

## 2014-09-28 DIAGNOSIS — T79A3XA Traumatic compartment syndrome of abdomen, initial encounter: Secondary | ICD-10-CM | POA: Diagnosis present

## 2014-09-28 DIAGNOSIS — L03311 Cellulitis of abdominal wall: Secondary | ICD-10-CM

## 2014-09-28 DIAGNOSIS — R6521 Severe sepsis with septic shock: Secondary | ICD-10-CM | POA: Diagnosis present

## 2014-09-28 DIAGNOSIS — Z79899 Other long term (current) drug therapy: Secondary | ICD-10-CM

## 2014-09-28 DIAGNOSIS — Z992 Dependence on renal dialysis: Secondary | ICD-10-CM

## 2014-09-28 DIAGNOSIS — Z6841 Body Mass Index (BMI) 40.0 and over, adult: Secondary | ICD-10-CM

## 2014-09-28 DIAGNOSIS — R1110 Vomiting, unspecified: Secondary | ICD-10-CM

## 2014-09-28 DIAGNOSIS — Z803 Family history of malignant neoplasm of breast: Secondary | ICD-10-CM | POA: Diagnosis not present

## 2014-09-28 DIAGNOSIS — E119 Type 2 diabetes mellitus without complications: Secondary | ICD-10-CM

## 2014-09-28 DIAGNOSIS — I959 Hypotension, unspecified: Secondary | ICD-10-CM

## 2014-09-28 DIAGNOSIS — N049 Nephrotic syndrome with unspecified morphologic changes: Secondary | ICD-10-CM | POA: Diagnosis present

## 2014-09-28 DIAGNOSIS — N179 Acute kidney failure, unspecified: Secondary | ICD-10-CM | POA: Diagnosis not present

## 2014-09-28 DIAGNOSIS — D62 Acute posthemorrhagic anemia: Secondary | ICD-10-CM | POA: Diagnosis present

## 2014-09-28 DIAGNOSIS — D72829 Elevated white blood cell count, unspecified: Secondary | ICD-10-CM | POA: Diagnosis not present

## 2014-09-28 DIAGNOSIS — Z8261 Family history of arthritis: Secondary | ICD-10-CM

## 2014-09-28 DIAGNOSIS — K208 Other esophagitis: Secondary | ICD-10-CM | POA: Diagnosis present

## 2014-09-28 DIAGNOSIS — T82534A Leakage of infusion catheter, initial encounter: Secondary | ICD-10-CM

## 2014-09-28 DIAGNOSIS — Z01818 Encounter for other preprocedural examination: Secondary | ICD-10-CM

## 2014-09-28 DIAGNOSIS — A409 Streptococcal sepsis, unspecified: Principal | ICD-10-CM | POA: Diagnosis present

## 2014-09-28 DIAGNOSIS — M1990 Unspecified osteoarthritis, unspecified site: Secondary | ICD-10-CM | POA: Diagnosis present

## 2014-09-28 DIAGNOSIS — D689 Coagulation defect, unspecified: Secondary | ICD-10-CM | POA: Diagnosis present

## 2014-09-28 DIAGNOSIS — L03115 Cellulitis of right lower limb: Secondary | ICD-10-CM | POA: Diagnosis present

## 2014-09-28 DIAGNOSIS — R400 Somnolence: Secondary | ICD-10-CM | POA: Diagnosis not present

## 2014-09-28 DIAGNOSIS — R680 Hypothermia, not associated with low environmental temperature: Secondary | ICD-10-CM | POA: Diagnosis present

## 2014-09-28 DIAGNOSIS — E859 Amyloidosis, unspecified: Secondary | ICD-10-CM | POA: Diagnosis present

## 2014-09-28 DIAGNOSIS — Z66 Do not resuscitate: Secondary | ICD-10-CM | POA: Diagnosis not present

## 2014-09-28 DIAGNOSIS — I10 Essential (primary) hypertension: Secondary | ICD-10-CM

## 2014-09-28 DIAGNOSIS — J189 Pneumonia, unspecified organism: Secondary | ICD-10-CM

## 2014-09-28 DIAGNOSIS — L8990 Pressure ulcer of unspecified site, unspecified stage: Secondary | ICD-10-CM | POA: Diagnosis present

## 2014-09-28 DIAGNOSIS — K922 Gastrointestinal hemorrhage, unspecified: Secondary | ICD-10-CM | POA: Diagnosis present

## 2014-09-28 DIAGNOSIS — E1122 Type 2 diabetes mellitus with diabetic chronic kidney disease: Secondary | ICD-10-CM | POA: Diagnosis not present

## 2014-09-28 DIAGNOSIS — Z96651 Presence of right artificial knee joint: Secondary | ICD-10-CM | POA: Diagnosis present

## 2014-09-28 DIAGNOSIS — L89302 Pressure ulcer of unspecified buttock, stage 2: Secondary | ICD-10-CM | POA: Diagnosis present

## 2014-09-28 LAB — COMPREHENSIVE METABOLIC PANEL
ALT: 23 U/L (ref 14–54)
ALT: 19 U/L (ref 14–54)
AST: 70 U/L — AB (ref 15–41)
AST: 75 U/L — AB (ref 15–41)
Albumin: <1 g/dL — ABNORMAL LOW (ref 3.5–5.0)
Albumin: 1 g/dL — ABNORMAL LOW (ref 3.5–5.0)
Alkaline Phosphatase: 928 U/L — ABNORMAL HIGH (ref 38–126)
Alkaline Phosphatase: 656 U/L — ABNORMAL HIGH (ref 38–126)
Anion gap: 15 (ref 5–15)
Anion gap: 16 — ABNORMAL HIGH (ref 5–15)
BILIRUBIN TOTAL: 4.6 mg/dL — AB (ref 0.3–1.2)
BUN: 47 mg/dL — AB (ref 6–20)
BUN: 49 mg/dL — AB (ref 6–20)
CHLORIDE: 97 mmol/L — AB (ref 101–111)
CO2: 28 mmol/L (ref 22–32)
CO2: 21 mmol/L — ABNORMAL LOW (ref 22–32)
CREATININE: 2.36 mg/dL — AB (ref 0.44–1.00)
Calcium: 8.2 mg/dL — ABNORMAL LOW (ref 8.9–10.3)
Calcium: 7.1 mg/dL — ABNORMAL LOW (ref 8.9–10.3)
Chloride: 90 mmol/L — ABNORMAL LOW (ref 101–111)
Creatinine, Ser: 2.31 mg/dL — ABNORMAL HIGH (ref 0.44–1.00)
GFR calc Af Amer: 24 mL/min — ABNORMAL LOW (ref >60)
GFR calc non Af Amer: 20 mL/min — ABNORMAL LOW (ref >60)
GFR calc non Af Amer: 21 mL/min — ABNORMAL LOW (ref >60)
GFR, EST AFRICAN AMERICAN: 24 mL/min — AB (ref >60)
GLUCOSE: 74 mg/dL (ref 65–99)
Glucose, Bld: 86 mg/dL (ref 65–99)
Potassium: 3.5 mmol/L (ref 3.5–5.1)
Potassium: 2.8 mmol/L — ABNORMAL LOW (ref 3.5–5.1)
Sodium: 133 mmol/L — ABNORMAL LOW (ref 135–145)
Sodium: 134 mmol/L — ABNORMAL LOW (ref 135–145)
TOTAL PROTEIN: 4.2 g/dL — AB (ref 6.5–8.1)
Total Bilirubin: 5.8 mg/dL — ABNORMAL HIGH (ref 0.3–1.2)
Total Protein: 4.7 g/dL — ABNORMAL LOW (ref 6.5–8.1)

## 2014-09-28 LAB — CBC
HCT: 35.5 % (ref 35.0–47.0)
HEMOGLOBIN: 11.5 g/dL — AB (ref 12.0–16.0)
MCH: 28.4 pg (ref 26.0–34.0)
MCHC: 32.3 g/dL (ref 32.0–36.0)
MCV: 88 fL (ref 80.0–100.0)
Platelets: 311 10*3/uL (ref 150–440)
RBC: 4.03 MIL/uL (ref 3.80–5.20)
RDW: 19.1 % — AB (ref 11.5–14.5)
WBC: 5.3 10*3/uL (ref 3.6–11.0)

## 2014-09-28 LAB — URINALYSIS COMPLETE WITH MICROSCOPIC (ARMC ONLY)
Cellular Cast, UA: 15
Glucose, UA: NEGATIVE mg/dL
KETONES UR: NEGATIVE mg/dL
LEUKOCYTES UA: NEGATIVE
Nitrite: NEGATIVE
PH: 5 (ref 5.0–8.0)
Protein, ur: 100 mg/dL — AB
Specific Gravity, Urine: 1.016 (ref 1.005–1.030)

## 2014-09-28 LAB — BLOOD GAS, ARTERIAL
Acid-base deficit: 0.7 mmol/L (ref 0.0–2.0)
BICARBONATE: 23.4 meq/L (ref 21.0–28.0)
O2 Saturation: 85 %
Patient temperature: 37
pCO2 arterial: 36 mmHg (ref 32.0–48.0)
pH, Arterial: 7.42 (ref 7.350–7.450)
pO2, Arterial: 49 mmHg — ABNORMAL LOW (ref 83.0–108.0)

## 2014-09-28 LAB — RENAL FUNCTION PANEL
Anion gap: 13 (ref 5–15)
BUN: 41 mg/dL — ABNORMAL HIGH (ref 6–20)
CO2: 23 mmol/L (ref 22–32)
Calcium: 7.2 mg/dL — ABNORMAL LOW (ref 8.9–10.3)
Chloride: 98 mmol/L — ABNORMAL LOW (ref 101–111)
Creatinine, Ser: 2.01 mg/dL — ABNORMAL HIGH (ref 0.44–1.00)
GFR calc Af Amer: 29 mL/min — ABNORMAL LOW (ref >60)
GFR, EST NON AFRICAN AMERICAN: 25 mL/min — AB (ref >60)
Glucose, Bld: 90 mg/dL (ref 65–99)
POTASSIUM: 3.4 mmol/L — AB (ref 3.5–5.1)
Phosphorus: 3.8 mg/dL (ref 2.5–4.6)
Sodium: 134 mmol/L — ABNORMAL LOW (ref 135–145)

## 2014-09-28 LAB — PROTIME-INR
INR: 2.58
Prothrombin Time: 27.8 seconds — ABNORMAL HIGH (ref 11.4–15.0)

## 2014-09-28 LAB — GLUCOSE, CAPILLARY
Glucose-Capillary: 48 mg/dL — ABNORMAL LOW (ref 65–99)
Glucose-Capillary: 80 mg/dL (ref 65–99)

## 2014-09-28 LAB — LACTIC ACID, PLASMA: LACTIC ACID, VENOUS: 5.7 mmol/L — AB (ref 0.5–2.0)

## 2014-09-28 MED ORDER — HEPARIN SODIUM (PORCINE) 5000 UNIT/ML IJ SOLN
5000.0000 [IU] | Freq: Three times a day (TID) | INTRAMUSCULAR | Status: DC
  Administered 2014-09-28 – 2014-10-02 (×13): 5000 [IU] via SUBCUTANEOUS
  Filled 2014-09-28 (×13): qty 1

## 2014-09-28 MED ORDER — SODIUM CHLORIDE 0.9 % IV SOLN: 1000.0000 mL | INTRAVENOUS | Status: DC

## 2014-09-28 MED ORDER — PIPERACILLIN-TAZOBACTAM 3.375 G IVPB
3.3750 g | Freq: Three times a day (TID) | INTRAVENOUS | Status: DC
  Administered 2014-09-28 – 2014-09-29 (×4): 3.375 g via INTRAVENOUS
  Filled 2014-09-28 (×8): qty 50

## 2014-09-28 MED ORDER — VASOPRESSIN 20 UNIT/ML IV SOLN
0.0300 [IU]/min | INTRAVENOUS | Status: DC
  Administered 2014-09-28 – 2014-10-10 (×14): 0.03 [IU]/min via INTRAVENOUS
  Filled 2014-09-28 (×14): qty 2

## 2014-09-28 MED ORDER — VANCOMYCIN HCL IN DEXTROSE 1-5 GM/200ML-% IV SOLN
1000.0000 mg | INTRAVENOUS | Status: DC
  Administered 2014-09-28 – 2014-09-30 (×3): 1000 mg via INTRAVENOUS
  Filled 2014-09-28 (×4): qty 200

## 2014-09-28 MED ORDER — NOREPINEPHRINE BITARTRATE 1 MG/ML IV SOLN
0.0000 ug/min | INTRAVENOUS | Status: DC
  Administered 2014-09-28: 24.533 ug/min via INTRAVENOUS
  Administered 2014-09-28 – 2014-09-29 (×5): 40 ug/min via INTRAVENOUS
  Administered 2014-09-30: 35 ug/min via INTRAVENOUS
  Administered 2014-09-30 – 2014-10-01 (×3): 40 ug/min via INTRAVENOUS
  Administered 2014-10-02: 35 ug/min via INTRAVENOUS
  Administered 2014-10-02: 30 ug/min via INTRAVENOUS
  Administered 2014-10-03 – 2014-10-04 (×2): 21 ug/min via INTRAVENOUS
  Administered 2014-10-04: 20 ug/min via INTRAVENOUS
  Administered 2014-10-05: 13 ug/min via INTRAVENOUS
  Administered 2014-10-06: 5 ug/min via INTRAVENOUS
  Filled 2014-09-28 (×20): qty 16

## 2014-09-28 MED ORDER — SODIUM CHLORIDE 0.9 % IV SOLN
1000.0000 mL | Freq: Once | INTRAVENOUS | Status: AC
  Administered 2014-09-28: 1000 mL via INTRAVENOUS

## 2014-09-28 MED ORDER — ONDANSETRON HCL 4 MG/2ML IJ SOLN
INTRAMUSCULAR | Status: AC
  Administered 2014-09-28: 4 mg via INTRAVENOUS
  Filled 2014-09-28: qty 2

## 2014-09-28 MED ORDER — VANCOMYCIN HCL IN DEXTROSE 1-5 GM/200ML-% IV SOLN
INTRAVENOUS | Status: AC
  Filled 2014-09-28: qty 200

## 2014-09-28 MED ORDER — SODIUM BICARBONATE 8.4 % IV SOLN
50.0000 meq | Freq: Once | INTRAVENOUS | Status: AC
  Administered 2014-09-28: 50 meq via INTRAVENOUS

## 2014-09-28 MED ORDER — MORPHINE SULFATE 2 MG/ML IJ SOLN
2.0000 mg | INTRAMUSCULAR | Status: DC | PRN
  Administered 2014-09-28 – 2014-10-05 (×11): 2 mg via INTRAVENOUS
  Filled 2014-09-28 (×11): qty 1

## 2014-09-28 MED ORDER — ONDANSETRON HCL 4 MG/2ML IJ SOLN
4.0000 mg | Freq: Once | INTRAMUSCULAR | Status: AC
  Administered 2014-09-28: 4 mg via INTRAVENOUS

## 2014-09-28 MED ORDER — NOREPINEPHRINE BITARTRATE 1 MG/ML IV SOLN
0.0000 ug/min | Freq: Once | INTRAVENOUS | Status: AC
  Administered 2014-09-28: 10 ug/min via INTRAVENOUS

## 2014-09-28 MED ORDER — ACETAMINOPHEN 650 MG RE SUPP
650.0000 mg | Freq: Four times a day (QID) | RECTAL | Status: DC | PRN
  Administered 2014-10-03: 650 mg via RECTAL
  Filled 2014-09-28: qty 1

## 2014-09-28 MED ORDER — CHLORHEXIDINE GLUCONATE 0.12 % MT SOLN
15.0000 mL | Freq: Two times a day (BID) | OROMUCOSAL | Status: DC
  Administered 2014-09-28 – 2014-10-03 (×9): 15 mL via OROMUCOSAL

## 2014-09-28 MED ORDER — NOREPINEPHRINE 4 MG/250ML-% IV SOLN
INTRAVENOUS | Status: AC
  Administered 2014-09-28: 4 mg via INTRAMUSCULAR
  Filled 2014-09-28: qty 250

## 2014-09-28 MED ORDER — SODIUM CHLORIDE 0.9 % IV SOLN
INTRAVENOUS | Status: DC
  Administered 2014-09-28: 05:00:00 via INTRAVENOUS

## 2014-09-28 MED ORDER — DEXTROSE 50 % IV SOLN
25.0000 mL | Freq: Once | INTRAVENOUS | Status: AC
  Administered 2014-09-28: 25 mL via INTRAVENOUS

## 2014-09-28 MED ORDER — VANCOMYCIN HCL IN DEXTROSE 1-5 GM/200ML-% IV SOLN
1000.0000 mg | Freq: Once | INTRAVENOUS | Status: AC
  Administered 2014-09-28: 1000 mg via INTRAVENOUS

## 2014-09-28 MED ORDER — ALBUMIN HUMAN 25 % IV SOLN
12.5000 g | Freq: Once | INTRAVENOUS | Status: AC
Start: 2014-09-28 — End: 2014-09-28
  Administered 2014-09-28: 12.5 g via INTRAVENOUS
  Filled 2014-09-28: qty 50

## 2014-09-28 MED ORDER — POTASSIUM CHLORIDE 10 MEQ/100ML IV SOLN
10.0000 meq | INTRAVENOUS | Status: DC
  Filled 2014-09-28 (×4): qty 100

## 2014-09-28 MED ORDER — HEPARIN SODIUM (PORCINE) 1000 UNIT/ML DIALYSIS
1000.0000 [IU] | INTRAMUSCULAR | Status: DC | PRN
  Administered 2014-10-01 – 2014-10-16 (×2): 1000 [IU] via INTRAVENOUS_CENTRAL
  Filled 2014-09-28: qty 6
  Filled 2014-09-28: qty 1
  Filled 2014-09-28 (×7): qty 6

## 2014-09-28 MED ORDER — ONDANSETRON HCL 4 MG PO TABS: 4.0000 mg | ORAL_TABLET | Freq: Four times a day (QID) | ORAL | Status: DC | PRN

## 2014-09-28 MED ORDER — SODIUM CHLORIDE 0.9 % IV SOLN: INTRAVENOUS | Status: DC

## 2014-09-28 MED ORDER — MIDODRINE HCL 5 MG PO TABS
10.0000 mg | ORAL_TABLET | Freq: Three times a day (TID) | ORAL | Status: DC
  Administered 2014-09-28 – 2014-10-17 (×45): 10 mg via ORAL
  Filled 2014-09-28 (×48): qty 2

## 2014-09-28 MED ORDER — BOOST / RESOURCE BREEZE PO LIQD: 1.0000 | Freq: Two times a day (BID) | ORAL | Status: DC

## 2014-09-28 MED ORDER — PIPERACILLIN-TAZOBACTAM 3.375 G IVPB
3.3750 g | Freq: Once | INTRAVENOUS | Status: AC
  Administered 2014-09-28: 3.375 g via INTRAVENOUS

## 2014-09-28 MED ORDER — ONDANSETRON HCL 4 MG/2ML IJ SOLN
4.0000 mg | Freq: Four times a day (QID) | INTRAMUSCULAR | Status: DC | PRN
  Administered 2014-09-28 – 2014-10-02 (×3): 4 mg via INTRAVENOUS
  Filled 2014-09-28 (×4): qty 2

## 2014-09-28 MED ORDER — CETYLPYRIDINIUM CHLORIDE 0.05 % MT LIQD
7.0000 mL | Freq: Two times a day (BID) | OROMUCOSAL | Status: DC
  Administered 2014-09-29 – 2014-10-03 (×10): 7 mL via OROMUCOSAL

## 2014-09-28 MED ORDER — PIPERACILLIN-TAZOBACTAM 3.375 G IVPB
INTRAVENOUS | Status: AC
  Administered 2014-09-28: 3.375 g via INTRAVENOUS
  Filled 2014-09-28: qty 50

## 2014-09-28 MED ORDER — NOREPINEPHRINE BITARTRATE 1 MG/ML IV SOLN
0.0000 ug/min | INTRAVENOUS | Status: DC
  Administered 2014-09-28: 25 ug/min via INTRAVENOUS
  Filled 2014-09-28 (×2): qty 4

## 2014-09-28 MED ORDER — POTASSIUM CHLORIDE 10 MEQ/100ML IV SOLN
10.0000 meq | INTRAVENOUS | Status: AC
  Administered 2014-09-28 (×4): 10 meq via INTRAVENOUS
  Filled 2014-09-28 (×4): qty 100

## 2014-09-28 MED ORDER — DOPAMINE-DEXTROSE 3.2-5 MG/ML-% IV SOLN
INTRAVENOUS | Status: AC
  Administered 2014-09-28: 800 mg
  Filled 2014-09-28: qty 250

## 2014-09-28 MED ORDER — ACETAMINOPHEN 325 MG PO TABS: 650.0000 mg | ORAL_TABLET | Freq: Four times a day (QID) | ORAL | Status: DC | PRN

## 2014-09-28 MED ORDER — VANCOMYCIN HCL 10 G IV SOLR
1250.0000 mg | INTRAVENOUS | Status: DC
  Filled 2014-09-28 (×2): qty 1250

## 2014-09-28 MED ORDER — SODIUM CHLORIDE 0.9 % IV SOLN
1000.0000 mL | INTRAVENOUS | Status: DC
  Administered 2014-09-28: 1000 mL via INTRAVENOUS

## 2014-09-28 MED ORDER — DEXTROSE 50 % IV SOLN
INTRAVENOUS | Status: AC
  Administered 2014-09-28: 25 mL via INTRAVENOUS
  Filled 2014-09-28: qty 50

## 2014-09-28 MED ORDER — POLYETHYLENE GLYCOL 3350 17 GM/SCOOP PO POWD
17.0000 g | Freq: Every day | ORAL | Status: DC | PRN
  Filled 2014-09-28: qty 255

## 2014-09-28 MED ORDER — SODIUM BICARBONATE 8.4 % IV SOLN
INTRAVENOUS | Status: AC
  Administered 2014-09-28: 06:00:00
  Filled 2014-09-28: qty 50

## 2014-09-28 MED ORDER — MORPHINE SULFATE 2 MG/ML IJ SOLN
2.0000 mg | Freq: Once | INTRAMUSCULAR | Status: AC
  Administered 2014-09-28: 2 mg via INTRAVENOUS
  Filled 2014-09-28: qty 1

## 2014-09-28 MED ORDER — NOREPINEPHRINE 4 MG/250ML-% IV SOLN
INTRAVENOUS | Status: AC
  Administered 2014-09-28: 2 ug/min
  Filled 2014-09-28: qty 250

## 2014-09-28 MED ORDER — PHENYLEPHRINE HCL 10 MG/ML IJ SOLN
0.0000 ug/min | INTRAVENOUS | Status: DC
  Administered 2014-09-29 (×2): 60 ug/min via INTRAVENOUS
  Administered 2014-09-29: 20 ug/min via INTRAVENOUS
  Administered 2014-09-29 (×4): 60 ug/min via INTRAVENOUS
  Administered 2014-09-30: 30 ug/min via INTRAVENOUS
  Administered 2014-09-30 (×2): 60 ug/min via INTRAVENOUS
  Filled 2014-09-28 (×11): qty 1

## 2014-09-28 NOTE — Progress Notes (Addendum)
[facility] [place] NAME: [name]    MR#:  [id number]  DATE OF BIRTH:  1950-11-26  SUBJECTIVE:  CHIEF COMPLAINT:   Chief Complaint  Patient presents with  . Altered Mental Status    No SOB. Feels weak.  REVIEW OF SYSTEMS:    Review of Systems  Constitutional: Positive for malaise/fatigue. Negative for fever and chills.  HENT: Negative for sore throat.   Eyes: Negative for blurred vision, double vision and pain.  Respiratory: Negative for cough, hemoptysis, shortness of breath and wheezing.   Cardiovascular: Positive for leg swelling. Negative for chest pain, palpitations and orthopnea.  Gastrointestinal: Negative for heartburn, nausea, vomiting, abdominal pain, diarrhea and constipation.  Genitourinary: Negative for dysuria and hematuria.  Musculoskeletal: Positive for joint pain. Negative for back pain.  Skin: Positive for rash (erythema over right abdominal wall).  Neurological: Positive for weakness. Negative for sensory change, speech change, focal weakness and headaches.  Endo/Heme/Allergies: Does not bruise/bleed easily.  Psychiatric/Behavioral: Negative for depression. The patient is not nervous/anxious.     Nutrition: Start ADA diet Tolerating Diet: Yes Tolerating PT: o     DRUG ALLERGIES:  No Known Allergies  VITALS:  Blood pressure 90/68, pulse 106, temperature 97.7 F (36.5 C), temperature source Oral, resp. rate 23, height 5\' 1"  (1.549 m), weight 107 kg (235 lb 14.3 oz), SpO2 93 %.  PHYSICAL EXAMINATION:   Physical Exam  GENERAL:  64 y.o.-year-old patient lying in the bed with no acute distress.  EYES: Pupils equal, round, reactive to light and accommodation. No scleral icterus. Extraocular muscles intact.  HEENT: Head atraumatic, normocephalic. Oropharynx and nasopharynx clear.  NECK:  Supple, no jugular venous distention. No thyroid enlargement, no tenderness.  LUNGS: Normal breath sounds  bilaterally, no wheezing, rales, rhonchi. No use of accessory muscles of respiration.  CARDIOVASCULAR: S1, S2 normal. No murmurs, rubs, or gallops.  ABDOMEN: Soft, nontender, nondistended. Bowel sounds present. No organomegaly or mass.  EXTREMITIES: No cyanosis, clubbing. 3+ LE edema NEUROLOGIC: Cranial nerves II through XII are intact. No focal Motor or sensory deficits b/l.   PSYCHIATRIC: The patient is alert and oriented x 3.  SKIN: No obvious rash, lesion, or ulcer. Erythema with warmth right abdominal wall  LABORATORY PANEL:   CBC  Recent Labs Lab 10/03/2014 0045  WBC 5.3  HGB 11.5*  HCT 35.5  PLT 311   ------------------------------------------------------------------------------------------------------------------  Chemistries   Recent Labs Lab 09/20/2014 0045  NA 133*  K 3.5  CL 90*  CO2 28  GLUCOSE 74  BUN 49*  CREATININE 2.36*  CALCIUM 8.2*  AST 70*  ALT 23  ALKPHOS 928*  BILITOT 4.6*   ------------------------------------------------------------------------------------------------------------------  Cardiac Enzymes No results for input(s): TROPONINI in the last 168 hours. ------------------------------------------------------------------------------------------------------------------  RADIOLOGY:  Dg Chest 1 View  09/14/2014   CLINICAL DATA:  Central line placement.  EXAM: CHEST  1 VIEW  COMPARISON:  06/26/2014  FINDINGS: Right IJ central line with tip crossing midline and terminating in the region of the left brachiocephalic vein.  Hypoventilation. When accounting for this there is no cardiomegaly or change in aortic tortuosity. No pneumothorax, edema, effusion, or definitive pneumonia.  These results were called by telephone at the time of interpretation on 09/10/2014 at 3:14 am to [place] , who verbally acknowledged these results.  IMPRESSION: 1. Right IJ central line with tip crossing midline and projecting at the left brachiocephalic vein. No pneumothorax.  2. Hypoventilation.   Electronically Signed  By: Monte Fantasia M.D.   On: 09/20/2014 03:15   Ct Head Wo Contrast  09/26/2014   CLINICAL DATA:  Altered mental status after fall from couch. Posterior neck pain. Initial encounter.  EXAM: CT HEAD WITHOUT CONTRAST  CT CERVICAL SPINE WITHOUT CONTRAST  TECHNIQUE: Multidetector CT imaging of the head and cervical spine was performed following the standard protocol without intravenous contrast. Multiplanar CT image reconstructions of the cervical spine were also generated.  COMPARISON:  None.  FINDINGS: CT HEAD FINDINGS  Skull and Sinuses:Negative for fracture or destructive process. The mastoids, middle ears, and imaged paranasal sinuses are clear.  Orbits: No acute abnormality.  Brain: No evidence of acute infarction, hemorrhage, hydrocephalus, or mass lesion/mass effect.  CT CERVICAL SPINE FINDINGS  Thyroid gland has indistinct margins with mild surrounding fat stranding. A sub cm dense nodule is noted on the right. No surrounding adenopathy.  No evidence of acute fracture or traumatic malalignment. No gross cervical canal hematoma. There is degenerative disc disease and endplate spurring, most progressed at C4-5, C5-6, and C6-7. Spurring is most notable at C5-6 with right uncovertebral spurs causing advanced foraminal stenosis. A superimposed right paracentral disc herniation with vacuum phenomenon contributes to the C5-6 right foraminal stenosis.  IMPRESSION: 1. No evidence of intracranial or cervical spine injury. 2. Mid and lower cervical degenerative disc disease with foraminal stenoses, most advanced on the right at C5-6. 3. Edema noted around the thyroid gland, suspicious for thyroiditis. Correlate with endocrine history and thyroid function tests.   Electronically Signed   By: Monte Fantasia M.D.   On: 09/08/2014 02:31   Ct Cervical Spine Wo Contrast  09/12/2014   CLINICAL DATA:  Altered mental status after fall from couch. Posterior neck pain. Initial  encounter.  EXAM: CT HEAD WITHOUT CONTRAST  CT CERVICAL SPINE WITHOUT CONTRAST  TECHNIQUE: Multidetector CT imaging of the head and cervical spine was performed following the standard protocol without intravenous contrast. Multiplanar CT image reconstructions of the cervical spine were also generated.  COMPARISON:  None.  FINDINGS: CT HEAD FINDINGS  Skull and Sinuses:Negative for fracture or destructive process. The mastoids, middle ears, and imaged paranasal sinuses are clear.  Orbits: No acute abnormality.  Brain: No evidence of acute infarction, hemorrhage, hydrocephalus, or mass lesion/mass effect.  CT CERVICAL SPINE FINDINGS  Thyroid gland has indistinct margins with mild surrounding fat stranding. A sub cm dense nodule is noted on the right. No surrounding adenopathy.  No evidence of acute fracture or traumatic malalignment. No gross cervical canal hematoma. There is degenerative disc disease and endplate spurring, most progressed at C4-5, C5-6, and C6-7. Spurring is most notable at C5-6 with right uncovertebral spurs causing advanced foraminal stenosis. A superimposed right paracentral disc herniation with vacuum phenomenon contributes to the C5-6 right foraminal stenosis.  IMPRESSION: 1. No evidence of intracranial or cervical spine injury. 2. Mid and lower cervical degenerative disc disease with foraminal stenoses, most advanced on the right at C5-6. 3. Edema noted around the thyroid gland, suspicious for thyroiditis. Correlate with endocrine history and thyroid function tests.   Electronically Signed   By: Monte Fantasia M.D.   On: 09/27/2014 02:31   Dg Chest Portable 1 View  09/30/2014   CLINICAL DATA:  Central line adjustment  EXAM: PORTABLE CHEST - 1 VIEW  COMPARISON:  Same day at 2:43 a.m.  FINDINGS: Shortening of the right IJ central line, but tip still terminates left of midline near the brachiocephalic vein.  Hypoventilation. Stable heart size and  mediastinal contours, size accentuated by  technique. There is interstitial crowding without edema, effusion, pneumothorax, or overt pneumonia.  IMPRESSION: 1. Despite right central line shortening the tip still terminates near the left brachiocephalic vein. 2. Hypoventilation.   Electronically Signed   By: Monte Fantasia M.D.   On: 09/20/2014 04:28     ASSESSMENT AND PLAN:   64 year old African American female history of nephrotic syndrome, amyloidosis, essential hypertension presenting with altered mental status/confusion after apparently falling out of bed. Admitted for septic shock  1. Septic shock with UTI and right flank abdominal wall cellulitis On abx. Still on 2 pressors. Has chronic mild hypotension.  2. Acute renal failure over CKD3 due to ATN. From septic shock. IVF> Pt was give albumin in ED but i doubt this will help. Nephrology consulted. Repeat Bun/Cr now. I/Os.  3. Type 2 diabetes, uncomplicated: Hold oral agents, at insulin slide scale every 6 hours Accu-Chek  4. Essential hypertension: Hold all agents  5.Venous thromboembolism prophylactic: Heparin subcutaneous   All the records are reviewed and case discussed with Care Management/Social Workerr. Management plans discussed with the patient, family and they are in agreement.  CODE STATUS: FULL CODE  DVT Prophylaxis: Hepari  TOTAL CRITICAL caRE TIME TAKING CARE OF THIS PATIENT: 35 minutes.   POSSIBLE D/C IN 3 DAYS, DEPENDING ON CLINICAL CONDITION.   Hillary Bow R M.D on 09/15/2014 at 12:03 PM  Between 7am to 6pm - Pager - [phone]  After 6pm go to [email or web address] - [email or web address] [facility]  [facility]  Office  [phone]  CC: Primary care physician; [name], MD

## 2014-09-28 NOTE — Consult Note (Signed)
[place]  [phone] [phone]  ID: Sherlie Ban OB: [birth year]  MR#: [id number]  VEH#:[id number]  Patient Care Team: Abner Greenspan, MD as PCP - General Lavonia Dana, MD as Consulting Physician (Nephrology)  CHIEF COMPLAINT:  Chief Complaint  Patient presents with  . Altered Mental Status    INTERVAL HISTORY: Patient is a 64 year old female recently diagnosed with amyloidosis and was scheduled to start chemotherapy today. Patient reportedly fell out of bed and was found to be very confused and significantly decreased blood pressure. She is now growing gram-positive cocci in her blood and her kidney disease has progressed where she is now requiring at least temporary dialysis. Patient feels improved since admission, but is no longer having fevers. She continues to feel significantly weak and fatigued. Patient feels generally terrible, but offers no further complaints.  REVIEW OF SYSTEMS:   Review of Systems  Constitutional: Positive for fever and malaise/fatigue.  Respiratory: Negative for shortness of breath.   Cardiovascular: Positive for leg swelling.  Neurological: Positive for weakness.    As per HPI. Otherwise, a complete review of systems is negatve.  PAST MEDICAL HISTORY: Past Medical History  Diagnosis Date  . HTN (hypertension)   . Diabetes mellitus type II   . Obesity   . Arthritis   . Chronic kidney disease   . Amyloidosis     PAST SURGICAL HISTORY: Past Surgical History  Procedure Laterality Date  . Total abdominal hysterectomy  10/1999    fibroids  . Combined abdominoplasty and liposuction  07/2000  . Total knee arthroplasty Right 09/28/2013    Procedure: RIGHT TOTAL KNEE ARTHROPLASTY;  Surgeon: Mauri Pole, MD;  Location: [facility];  Service: Orthopedics;  Laterality: Right;    FAMILY HISTORY Family History  Problem Relation Age of Onset  . Arthritis Mother   . Hypertension Mother   . Cancer Mother       breast, lung  . Diabetes Mother   . Hypertension Father        ADVANCED DIRECTIVES:    HEALTH MAINTENANCE: History  Substance Use Topics  . Smoking status: Never Smoker   . Smokeless tobacco: Never Used  . Alcohol Use: No     Colonoscopy:  PAP:  Bone density:  Lipid panel:  No Known Allergies  Current Facility-Administered Medications  Medication Dose Route Frequency Provider Last Rate Last Dose  . 0.9 %  sodium chloride infusion  1,000 mL Intravenous Continuous Gregor Hams, MD 999 mL/hr at 10/02/2014 0258 1,000 mL at 09/05/2014 0258  . 0.9 %  sodium chloride infusion   Intravenous Continuous Lytle Butte, MD 125 mL/hr at 09/11/2014 0500    . 0.9 %  sodium chloride infusion   Intravenous Continuous Lytle Butte, MD   Stopped at 09/08/2014 [id number]  . acetaminophen (TYLENOL) tablet 650 mg  650 mg Oral Q6H PRN Lytle Butte, MD       Or  . acetaminophen (TYLENOL) suppository 650 mg  650 mg Rectal Q6H PRN Lytle Butte, MD      . antiseptic oral rinse (CPC / CETYLPYRIDINIUM CHLORIDE 0.05%) solution 7 mL  7 mL Mouth Rinse q12n4p Harrie Foreman, MD   7 mL at 10/02/2014 1221  . chlorhexidine (PERIDEX) 0.12 % solution 15 mL  15 mL Mouth Rinse BID Harrie Foreman, MD   15 mL at 09/19/2014 0800  . feeding supplement (RESOURCE BREEZE) (RESOURCE BREEZE) liquid 1 Container  1 Container Oral BID BM  Hillary Bow, MD   1 Container at 09/06/2014 1553  . heparin injection 1,000-6,000 Units  1,000-6,000 Units CRRT PRN Murlean Iba, MD      . heparin injection 5,000 Units  5,000 Units Subcutaneous 3 times per day Lytle Butte, MD   5,000 Units at 09/11/2014 1558  . midodrine (PROAMATINE) tablet 10 mg  10 mg Oral TID Lytle Butte, MD   10 mg at 09/30/2014 1727  . morphine 2 MG/ML injection 2 mg  2 mg Intravenous Q4H PRN Lytle Butte, MD   2 mg at 09/07/2014 8315  . norepinephrine (LEVOPHED) 16 mg in dextrose 5 % 250 mL (0.064 mg/mL) infusion  0-40 mcg/min Intravenous Titrated Hillary Bow, MD 23  mL/hr at 09/08/2014 1203 24.533 mcg/min at 09/26/2014 1203  . ondansetron (ZOFRAN) tablet 4 mg  4 mg Oral Q6H PRN Lytle Butte, MD       Or  . ondansetron [facility]) injection 4 mg  4 mg Intravenous Q6H PRN Lytle Butte, MD   4 mg at 09/08/2014 1349  . piperacillin-tazobactam (ZOSYN) IVPB 3.375 g  3.375 g Intravenous 3 times per day Lytle Butte, MD   3.375 g at 09/10/2014 1558  . polyethylene glycol powder (GLYCOLAX/MIRALAX) container 17 g  17 g Oral Daily PRN Lytle Butte, MD      . potassium chloride 10 mEq in 100 mL IVPB  10 mEq Intravenous Q1 Hr x 4 Flora Lipps, MD   10 mEq at 09/08/2014 1800  . vancomycin (VANCOCIN) IVPB 1000 mg/200 mL premix  1,000 mg Intravenous Q24H Hillary Bow, MD   1,000 mg at 09/04/2014 1635  . vasopressin (PITRESSIN) 40 Units in sodium chloride 0.9 % 250 mL (0.16 Units/mL) infusion  0.03 Units/min Intravenous Continuous Lytle Butte, MD 11.3 mL/hr at 09/11/2014 0503 0.03 Units/min at 10/01/2014 0503    OBJECTIVE: Filed Vitals:   09/15/2014 1100  BP: 90/68  Pulse: 106  Temp: 97.7 F (36.5 C)  Resp: 23     Body mass index is 44.59 kg/(m^2).    ECOG FS:3 - Symptomatic, >50% confined to bed  General: Well-developed, well-nourished, no acute distress. Eyes: Pink conjunctiva, anicteric sclera. HEENT: Normocephalic, moist mucous membranes, clear oropharnyx. Lungs: Clear to auscultation bilaterally. Heart: Regular rate and rhythm. No rubs, murmurs, or gallops. Abdomen: Soft, nontender. Musculoskeletal: Bilateral lower extremity peripheral edema.. Neuro: Alert, answering all questions appropriately. Cranial nerves grossly intact. Skin: No rashes or petechiae noted. Psych: Normal affect.   LAB RESULTS:  Lab Results  Component Value Date   NA 134* 09/22/2014   K 2.8* 09/08/2014   CL 97* 10/03/2014   CO2 21* 10/04/2014   GLUCOSE 86 09/20/2014   BUN 47* 09/21/2014   CREATININE 2.31* 09/18/2014   CALCIUM 7.1* 09/20/2014   PROT 4.2* 09/27/2014   ALBUMIN 1.0* 09/10/2014    AST 75* 09/20/2014   ALT 19 09/15/2014   ALKPHOS 656* 09/22/2014   BILITOT 5.8* 09/26/2014   GFRNONAA 21* 09/25/2014   GFRAA 24* 09/08/2014    Lab Results  Component Value Date   WBC 5.3 10/03/2014   NEUTROABS 5.3 09/08/2014   HGB 11.5* 09/13/2014   HCT 35.5 09/16/2014   MCV 88.0 10/01/2014   PLT 311 09/18/2014     STUDIES: Dg Chest 1 View  09/25/2014   CLINICAL DATA:  Central line placement.  EXAM: CHEST  1 VIEW  COMPARISON:  06/26/2014  FINDINGS: Right IJ central line with tip crossing midline and terminating in  the region of the left brachiocephalic vein.  Hypoventilation. When accounting for this there is no cardiomegaly or change in aortic tortuosity. No pneumothorax, edema, effusion, or definitive pneumonia.  These results were called by telephone at the time of interpretation on 09/27/2014 at 3:14 am to [place] , who verbally acknowledged these results.  IMPRESSION: 1. Right IJ central line with tip crossing midline and projecting at the left brachiocephalic vein. No pneumothorax. 2. Hypoventilation.   Electronically Signed   By: Monte Fantasia M.D.   On: 09/12/2014 03:15   Ct Head Wo Contrast  10/03/2014   CLINICAL DATA:  Altered mental status after fall from couch. Posterior neck pain. Initial encounter.  EXAM: CT HEAD WITHOUT CONTRAST  CT CERVICAL SPINE WITHOUT CONTRAST  TECHNIQUE: Multidetector CT imaging of the head and cervical spine was performed following the standard protocol without intravenous contrast. Multiplanar CT image reconstructions of the cervical spine were also generated.  COMPARISON:  None.  FINDINGS: CT HEAD FINDINGS  Skull and Sinuses:Negative for fracture or destructive process. The mastoids, middle ears, and imaged paranasal sinuses are clear.  Orbits: No acute abnormality.  Brain: No evidence of acute infarction, hemorrhage, hydrocephalus, or mass lesion/mass effect.  CT CERVICAL SPINE FINDINGS  Thyroid gland has indistinct margins with mild surrounding  fat stranding. A sub cm dense nodule is noted on the right. No surrounding adenopathy.  No evidence of acute fracture or traumatic malalignment. No gross cervical canal hematoma. There is degenerative disc disease and endplate spurring, most progressed at C4-5, C5-6, and C6-7. Spurring is most notable at C5-6 with right uncovertebral spurs causing advanced foraminal stenosis. A superimposed right paracentral disc herniation with vacuum phenomenon contributes to the C5-6 right foraminal stenosis.  IMPRESSION: 1. No evidence of intracranial or cervical spine injury. 2. Mid and lower cervical degenerative disc disease with foraminal stenoses, most advanced on the right at C5-6. 3. Edema noted around the thyroid gland, suspicious for thyroiditis. Correlate with endocrine history and thyroid function tests.   Electronically Signed   By: Monte Fantasia M.D.   On: 09/26/2014 02:31   Ct Cervical Spine Wo Contrast  09/26/2014   CLINICAL DATA:  Altered mental status after fall from couch. Posterior neck pain. Initial encounter.  EXAM: CT HEAD WITHOUT CONTRAST  CT CERVICAL SPINE WITHOUT CONTRAST  TECHNIQUE: Multidetector CT imaging of the head and cervical spine was performed following the standard protocol without intravenous contrast. Multiplanar CT image reconstructions of the cervical spine were also generated.  COMPARISON:  None.  FINDINGS: CT HEAD FINDINGS  Skull and Sinuses:Negative for fracture or destructive process. The mastoids, middle ears, and imaged paranasal sinuses are clear.  Orbits: No acute abnormality.  Brain: No evidence of acute infarction, hemorrhage, hydrocephalus, or mass lesion/mass effect.  CT CERVICAL SPINE FINDINGS  Thyroid gland has indistinct margins with mild surrounding fat stranding. A sub cm dense nodule is noted on the right. No surrounding adenopathy.  No evidence of acute fracture or traumatic malalignment. No gross cervical canal hematoma. There is degenerative disc disease and  endplate spurring, most progressed at C4-5, C5-6, and C6-7. Spurring is most notable at C5-6 with right uncovertebral spurs causing advanced foraminal stenosis. A superimposed right paracentral disc herniation with vacuum phenomenon contributes to the C5-6 right foraminal stenosis.  IMPRESSION: 1. No evidence of intracranial or cervical spine injury. 2. Mid and lower cervical degenerative disc disease with foraminal stenoses, most advanced on the right at C5-6. 3. Edema noted around the thyroid gland, suspicious  for thyroiditis. Correlate with endocrine history and thyroid function tests.   Electronically Signed   By: Monte Fantasia M.D.   On: 09/18/2014 02:31   Nm Cardiac Muga Rest  09/20/2014   CLINICAL DATA:  . Evaluate cardiac function in relation to chemotherapy.  EXAM: NUCLEAR MEDICINE CARDIAC BLOOD POOL IMAGING (MUGA)  TECHNIQUE: Cardiac multi-gated acquisition was performed at rest following intravenous injection of Tc-17mlabeled red blood cells.  RADIOPHARMACEUTICALS:  21.9 mCi Technetium-932mn-vitro labeled red blood cells IV  COMPARISON:  None.  FINDINGS: No  focal wall motion abnormality of the left ventricle.  Calculated left ventricular ejection fraction equals 65%  IMPRESSION: Left ventricular ejection fraction equals 65%   Electronically Signed   By: StSuzy Bouchard.D.   On: 09/20/2014 09:31   Ct Biopsy  09/08/2014   CLINICAL DATA:  Amyloidosis, nephrotic syndrome, hypertension  EXAM: CT GUIDED RIGHT ILIAC BONE MARROW ASPIRATION AND CORE BIOPSY  Date:  5/5/20165/09/2014 9:32 am  Radiologist:  M. TrDaryll BrodMD  Guidance:  CT  FLUOROSCOPY TIME:  NONE.  MEDICATIONS AND MEDICAL HISTORY: 1 MG VERSED, 25 MCG FENTANYL  ANESTHESIA/SEDATION: 15 MINUTES  CONTRAST:  None.  COMPLICATIONS: None  PROCEDURE: Informed consent was obtained from the patient following explanation of the procedure, risks, benefits and alternatives. The patient understands, agrees and consents for the procedure. All  questions were addressed. A time out was performed.  The patient was positioned prone and noncontrast localization CT was performed of the pelvis to demonstrate the iliac marrow spaces.  Maximal barrier sterile technique utilized including caps, mask, sterile gowns, sterile gloves, large sterile drape, hand hygiene, and betadine prep.  Under sterile conditions and local anesthesia, an 11 gauge coaxial bone biopsy needle was advanced into the right iliac marrow space. Needle position was confirmed with CT imaging. Initially, bone marrow aspiration was performed. Next, the 11 gauge outer cannula was utilized to obtain a right iliac bone marrow core biopsy. Needle was removed. Hemostasis was obtained with compression. The patient tolerated the procedure well. Samples were prepared with the cytotechnologist. No immediate complications.  IMPRESSION: CT guided right iliac bone marrow aspiration and core biopsy.   Electronically Signed   By: M.Jerilynn Mages Shick M.D.   On: 09/08/2014 10:43   Dg Chest Portable 1 View  09/12/2014   CLINICAL DATA:  Central line adjustment  EXAM: PORTABLE CHEST - 1 VIEW  COMPARISON:  Same day at 2:43 a.m.  FINDINGS: Shortening of the right IJ central line, but tip still terminates left of midline near the brachiocephalic vein.  Hypoventilation. Stable heart size and mediastinal contours, size accentuated by technique. There is interstitial crowding without edema, effusion, pneumothorax, or overt pneumonia.  IMPRESSION: 1. Despite right central line shortening the tip still terminates near the left brachiocephalic vein. 2. Hypoventilation.   Electronically Signed   By: JoMonte Fantasia.D.   On: 09/04/2014 04:28   Ct Chest Abdomen And Pelvis Wo ([facility] H[facility])  08/31/2014   CLINICAL DATA:  Amyloidosis. Leg swelling since January. Diabetic. Elevated creatinine.  EXAM: CT CHEST, ABDOMEN AND PELVIS WITHOUT CONTRAST  TECHNIQUE: Multidetector CT imaging of the chest, abdomen and pelvis was performed following  the standard protocol without IV contrast.  COMPARISON:  Clinic note of 08/22/2014. Chest radiograph 06/26/2014.  FINDINGS: CT CHEST FINDINGS  Mediastinum/Nodes: No axillary adenopathy. Aortic atherosclerosis. Tortuous descending thoracic aorta. Mild cardiomegaly, without pericardial effusion.  No mediastinal or definite hilar adenopathy, given limitations of unenhanced CT.  Lungs/Pleura: Trace left pleural  fluid or thickening. Volume loss at the lung bases anteriorly, greater on the right. There is also bibasilar subsegmental atelectasis or scar.  Musculoskeletal: Lipoma in the right axilla measures 4.6 cm. Bilateral glenohumeral joint osteoarthritis.  CT ABDOMEN AND PELVIS FINDINGS  Hepatobiliary: Hepatomegaly, 20 cm. Small gallstones without acute cholecystitis or biliary ductal dilatation.  Pancreas: Normal, without mass or ductal dilatation.  Spleen: Normal  Adrenals/Urinary Tract: Normal left adrenal gland. Low-density right adrenal nodule measures 2.4 cm, consistent with an adenoma.  No renal calculi or hydronephrosis.    No bladder calculi.  Stomach/Bowel: Underdistended stomach. Normal colon, appendix, and terminal ileum. Normal small bowel.  Vascular/Lymphatic: Normal caliber of the aorta and branch vessels. No abdominopelvic adenopathy.  Reproductive: Hysterectomy.  No adnexal mass.  Other: Small volume abdominal pelvic ascites. Anasarca about the lower pelvis and imaged upper extremities  Musculoskeletal: 8 mm lucent lesion in the right iliac. Thoracolumbar spondylosis. Prominent disc bulges at L3-4 and L4-5.  IMPRESSION: 1. Isolated subtle nonspecific subcentimeter right iliac lucent lesion. Otherwise, no evidence of multiple myeloma. 2. No adenopathy within the chest, abdomen, or pelvis. 3. Abdominal pelvic ascites and anasarca. Question fluid overload/congestive heart failure. 4. Right adrenal adenoma. 5. Trace left pleural fluid. 6. Cholelithiasis.   Electronically Signed   By: Abigail Miyamoto M.D.    On: 08/31/2014 08:58    ASSESSMENT: Amyloidosis, gram-positive cocci septicemia.  PLAN:    1. Amyloidosis: Patient was scheduled to start chemotherapy today using Velcade, cyclophosphamide, dexamethasone. This can be delayed 2-3 weeks until her acute symptoms resolve. 2. Renal failure: Patient is now on dialysis. Unclear if this is temporary or permanent at this time. Appreciate nephrology input. 3. Infectious disease: Blood cultures positive for gram-positive cocci. Awaiting sensitivities. 4. Hypotension: Patient still requiring 2 pressors to maintain a blood pressure of 90/60s. Continue to monitor.  Appreciate consult, will follow.  Lloyd Huger, MD   09/17/2014 6:07 PM

## 2014-09-28 NOTE — Consult Note (Signed)
[facility] Vascular Consult Note  MRN : [id number]  [name] is a 64 y.o. ([birth year]) female who presents with chief complaint of  Chief Complaint  Patient presents with  . Altered Mental Status  .  History of Present Illness: The patient is a 64 y.o. female with a known history of amyloidosis, nephrotic syndrome, essential hypertension who presented to the emergency room with altered mental status. Apparently she sustained a fall at home. Reportedly she fell out of bed and EMS was called as she was unable to get up. On arrival at the emergency room she was noted to be markedly hypotensive with systolic blood pressure of 50s. She is unable to provide meaningful information at this time given her persistent decreased mental state.  From the emergency room she was admitted to the intensive care unit. Over the past 12 hours her condition has continued to deteriorate her volume overload has worsened and her renal failure has increased. She was seen by nephrology who feels she must dialysis urgently. Given these parameters temporally catheter is appropriate tunneled catheter other forms of AV access are not.  Current Facility-Administered Medications  Medication Dose Route Frequency Provider Last Rate Last Dose  . 0.9 %  sodium chloride infusion  1,000 mL Intravenous Continuous Gregor Hams, MD 999 mL/hr at 09/27/2014 0258 1,000 mL at 09/15/2014 0258  . 0.9 %  sodium chloride infusion   Intravenous Continuous Lytle Butte, MD 125 mL/hr at 09/21/2014 0500    . 0.9 %  sodium chloride infusion   Intravenous Continuous Lytle Butte, MD   Stopped at 10/02/2014 [phone]  . acetaminophen (TYLENOL) tablet 650 mg  650 mg Oral Q6H PRN Lytle Butte, MD       Or  . acetaminophen (TYLENOL) suppository 650 mg  650 mg Rectal Q6H PRN Lytle Butte, MD      . antiseptic oral rinse (CPC / CETYLPYRIDINIUM CHLORIDE 0.05%) solution 7 mL  7 mL Mouth Rinse q12n4p Harrie Foreman, MD   7  mL at 09/14/2014 1221  . chlorhexidine (PERIDEX) 0.12 % solution 15 mL  15 mL Mouth Rinse BID Harrie Foreman, MD   15 mL at 10/02/2014 0800  . feeding supplement (RESOURCE BREEZE) (RESOURCE BREEZE) liquid 1 Container  1 Container Oral BID BM Srikar Sudini, MD      . heparin injection 1,000-6,000 Units  1,000-6,000 Units CRRT PRN Murlean Iba, MD      . heparin injection 5,000 Units  5,000 Units Subcutaneous 3 times per day Lytle Butte, MD   5,000 Units at 09/21/2014 [phone]  . midodrine (PROAMATINE) tablet 10 mg  10 mg Oral TID Lytle Butte, MD   10 mg at 09/18/2014 1011  . morphine 2 MG/ML injection 2 mg  2 mg Intravenous Q4H PRN Lytle Butte, MD   2 mg at 09/30/2014 7425  . norepinephrine (LEVOPHED) 16 mg in dextrose 5 % 250 mL (0.064 mg/mL) infusion  0-40 mcg/min Intravenous Titrated Hillary Bow, MD 23 mL/hr at 10/03/2014 1203 24.533 mcg/min at 09/11/2014 1203  . ondansetron (ZOFRAN) tablet 4 mg  4 mg Oral Q6H PRN Lytle Butte, MD       Or  . ondansetron [facility]) injection 4 mg  4 mg Intravenous Q6H PRN Lytle Butte, MD   4 mg at 09/17/2014 1349  . piperacillin-tazobactam (ZOSYN) IVPB 3.375 g  3.375 g Intravenous 3 times per day Lytle Butte, MD  3.375 g at 09/11/2014 0649  . polyethylene glycol powder (GLYCOLAX/MIRALAX) container 17 g  17 g Oral Daily PRN Lytle Butte, MD      . potassium chloride 10 mEq in 100 mL IVPB  10 mEq Intravenous Q1 Hr x 4 Flora Lipps, MD      . vancomycin (VANCOCIN) IVPB 1000 mg/200 mL premix  1,000 mg Intravenous Q24H Srikar Sudini, MD      . vasopressin (PITRESSIN) 40 Units in sodium chloride 0.9 % 250 mL (0.16 Units/mL) infusion  0.03 Units/min Intravenous Continuous Lytle Butte, MD 11.3 mL/hr at 09/19/2014 0503 0.03 Units/min at 09/10/2014 0503    Past Medical History  Diagnosis Date  . HTN (hypertension)   . Diabetes mellitus type II   . Obesity   . Arthritis   . Chronic kidney disease   . Amyloidosis     Past Surgical History  Procedure Laterality Date  .  Total abdominal hysterectomy  10/1999    fibroids  . Combined abdominoplasty and liposuction  07/2000  . Total knee arthroplasty Right 09/28/2013    Procedure: RIGHT TOTAL KNEE ARTHROPLASTY;  Surgeon: Mauri Pole, MD;  Location: [facility];  Service: Orthopedics;  Laterality: Right;    Social History History  Substance Use Topics  . Smoking status: Never Smoker   . Smokeless tobacco: Never Used  . Alcohol Use: No    Family History Family History  Problem Relation Age of Onset  . Arthritis Mother   . Hypertension Mother   . Cancer Mother     breast, lung  . Diabetes Mother   . Hypertension Father     No Known Allergies   REVIEW OF SYSTEMS (Negative unless checked)  Constitutional: [] Weight loss  [] Fever  [] Chills Cardiac: [] Chest pain   [] Chest pressure   [] Palpitations   [x] Shortness of breath when laying flat   [x] Shortness of breath at rest   [x] Shortness of breath with exertion. Vascular:  [] Pain in legs with walking   [] Pain in legs at rest   [] Pain in legs when laying flat   [] Claudication   [] Pain in feet when walking  [] Pain in feet at rest  [x] Pain in feet when laying flat   [] History of DVT   [] Phlebitis   [x] Swelling in legs   [] Varicose veins   [] Non-healing ulcers Pulmonary:   [] Uses home oxygen   [] Productive cough   [] Hemoptysis   [] Wheeze  [] COPD   [] Asthma Neurologic:  [] Dizziness  [] Blackouts   [] Seizures   [] History of stroke   [] History of TIA  [] Aphasia   [] Temporary blindness   [] Dysphagia   [x] Weakness or numbness in arms   [x] Weakness or numbness in legs Musculoskeletal:  [] Arthritis   [] Joint swelling   [] Joint pain   [] Low back pain Hematologic:  [] Easy bruising  [] Easy bleeding   [] Hypercoagulable state   [] Anemic  [] Hepatitis Gastrointestinal:  [] Blood in stool   [] Vomiting blood  [] Gastroesophageal reflux/heartburn   [] Difficulty swallowing. Genitourinary:  [x] Chronic kidney disease   [] Difficult urination  [] Frequent urination  [] Burning with urination    [] Blood in urine Skin:  [] Rashes   [] Ulcers   [] Wounds Psychological:  [] History of anxiety   []  History of major depression.    Physical Examination  Filed Vitals:   09/10/2014 0800 09/04/2014 0900 09/23/2014 1000 09/27/2014 1100  BP: 90/77 96/70 95/70  90/68  Pulse: 102 105 107 106  Temp: 97.5 F (36.4 C)   97.7 F (36.5 C)  TempSrc: Oral  Oral  Resp: 22 27 25 23   Height:      Weight:      SpO2: 97% 95% 93% 93%   Body mass index is 44.59 kg/(m^2).  Head: [place]/AT, No temporalis wasting. Prominent temp pulse not noted. Ear/Nose/Throat: Hearing grossly intact, nares w/o erythema or drainage, oropharynx w/o Erythema/Exudate, Mallampati score: Class IV.  Dentition poor.  Eyes: PERRLA, EOMI.  Neck: Supple, no nuchal rigidity.  No bruit or JVD.  Pulmonary:  Good air movement, clear to auscultation bilaterally.  Cardiac: RRR, normal S1, S2, no Murmurs, rubs or gallops. Vascular: Massive edema bilateral lower extremities extending onto the abdominal wall   Vessel Right Left  Radial Palpable Palpable  Ulnar NotPalpable NotPalpable  Brachial Palpable Palpable  Carotid Palpable, without bruit Palpable, without bruit  Aorta Not palpable N/A  Femoral NotPalpable NotPalpable  Popliteal NotPalpable NotPalpable  PT NotPalpable NotPalpable  DP Notalpable NotPalpable   Gastrointestinal: soft, non-tender/non-distended. No guarding/reflex. No masses, surgical incisions, or scars. Musculoskeletal: M/S 2/5 throughout.  Extremities without ischemic changes.  No deformity or atrophy. No edema. Neurologic: CN 2-12 intact. lethargic. Motor exam as listed above. Psychiatric: Judgment intact, Mood & affect appropriate for pt's clinical situation. Dermatologic: No rashes or ulcers noted.  No cellulitis or open wounds. Lymph : No Cervical, Axillary, or Inguinal lymphadenopathy.   CBC Lab Results  Component Value Date   WBC 5.3 09/07/2014   HGB 11.5* 09/29/2014   HCT 35.5 09/07/2014   MCV 88.0  09/09/2014   PLT 311 09/10/2014    BMET    Component Value Date/Time   NA 134* 09/19/2014 1403   NA 136 08/22/2014 1712   K 2.8* 09/17/2014 1403   K 2.6* 08/22/2014 1712   CL 97* 09/05/2014 1403   CL 92* 08/22/2014 1712   CO2 21* 09/20/2014 1403   CO2 35* 08/22/2014 1712   GLUCOSE 86 09/06/2014 1403   GLUCOSE 167* 08/22/2014 1712   BUN 47* 09/18/2014 1403   BUN 29* 08/22/2014 1712   CREATININE 2.31* 10/03/2014 1403   CREATININE 1.78* 08/22/2014 1712   CALCIUM 7.1* 10/01/2014 1403   CALCIUM 8.2* 08/22/2014 1712   GFRNONAA 21* 09/05/2014 1403   GFRNONAA 29* 08/22/2014 1712   GFRAA 24* 09/23/2014 1403   GFRAA 34* 08/22/2014 1712   Estimated Creatinine Clearance: 27.4 mL/min (by C-G formula based on Cr of 2.31).  COAG Lab Results  Component Value Date   INR 2.58 09/05/2014   INR 1.93* 09/08/2014   INR 1.9 08/22/2014    Assessment/Plan  1. Acute kidney injury: The patient will require urgent dialysis and so temp cath will be placed.  There is a right Ij central line and we do not have 24 cm dialysis catheters therefore a right femoral cath can be placed.IV fluid hydration as above, goal mean arterial pressure greater than 65, we'll infuse 25 g albumin given nephrotic syndrome with undetectable albumin.  2. Septic shock: meeting septic criteria by temperature, heart rate present on arrival. Panculture. Broad-spectrum antibiotics including vancomycin/Zosyn and taper antibiotics when culture data returns. she has received a 30 mL/kg IV fluid bolus. Continue IV fluid hydration to keep mean arterial pressure greater than 65. We will repeat lactic acid given the initial is greater than 2.2. Given lactic acidosis, hypotension suspected general acidosis ABG is pending at this time however were empirically treat 1 and bicarbonate, increased Levophed to 30 mcg, and vasopressin.   3. Type 2 diabetes, uncomplicated: Hold oral agents, at insulin slide scale every 6  hours Accu-Chek  4.  Essential hypertension: Hold all agents  Consult level three  Balian Schaller, Dolores Lory, MD  09/27/2014 3:28 PM

## 2014-09-28 NOTE — H&P (Signed)
[place] [facility] [place] NAME: [name]    MR#:  [id number]  DATE OF BIRTH:  08-07-1950   DATE OF ADMISSION:  09/18/2014  PRIMARY CARE PHYSICIAN: [name], MD   REQUESTING/REFERRING PHYSICIAN: Owens Shark  CHIEF COMPLAINT:   Chief Complaint  Patient presents with  . Altered Mental Status    HISTORY OF PRESENT ILLNESS:  [name]  is a 64 y.o. female with a known history of amyloidosis, nephrotic syndrome, essential hypertension presenting with altered mental status. Per EMS and patient she is coming into the hospital after falling out of bed. However, markedly hypotensive on arrival systolic blood pressure of 50s. She is unable to provide meaningful information at this time given mental status/medical condition. Emergency room course: Patient being septic criteria, consult sepsis initiated, central line placed, Levophed ordered, labs return elevated lactic acid, ABG pending. she is able to protect airway, however unable to provide meaningful information.  PAST MEDICAL HISTORY:   Past Medical History  Diagnosis Date  . HTN (hypertension)   . Diabetes mellitus type II   . Obesity   . Arthritis   . Chronic kidney disease   . Amyloidosis     PAST SURGICAL HISTORY:   Past Surgical History  Procedure Laterality Date  . Total abdominal hysterectomy  10/1999    fibroids  . Combined abdominoplasty and liposuction  07/2000  . Total knee arthroplasty Right 09/28/2013    Procedure: RIGHT TOTAL KNEE ARTHROPLASTY;  Surgeon: Mauri Pole, MD;  Location: [facility];  Service: Orthopedics;  Laterality: Right;    SOCIAL HISTORY:   History  Substance Use Topics  . Smoking status: Never Smoker   . Smokeless tobacco: Never Used  . Alcohol Use: No    FAMILY HISTORY:   Family History  Problem Relation Age of Onset  . Arthritis Mother   . Hypertension Mother   . Cancer Mother     breast, lung  . Diabetes Mother   . Hypertension  Father     DRUG ALLERGIES:  No Known Allergies  REVIEW OF SYSTEMS:  Unable to obtain given patient's mental status/medical condition  MEDICATIONS AT HOME:   Prior to Admission medications   Medication Sig Start Date End Date Taking? Authorizing Provider  metolazone (ZAROXOLYN) 5 MG tablet Take 5 mg by mouth daily.    Historical Provider, MD  midodrine (PROAMATINE) 10 MG tablet Take 10 mg by mouth 3 (three) times daily.    Historical Provider, MD  naftifine (NAFTIN) 1 % cream Apply topically daily. To affected areas/feet Patient taking differently: Apply 1 application topically daily as needed (affected areas on feet.).  06/07/14   Abner Greenspan, MD  polyethylene glycol powder (GLYCOLAX/MIRALAX) powder Take 17 g by mouth daily as needed for moderate constipation. 09/20/14   Abner Greenspan, MD  potassium chloride SA (K-DUR,KLOR-CON) 20 MEQ tablet Take 1 tablet (20 mEq total) by mouth daily. 07/25/14   Abner Greenspan, MD  torsemide (DEMADEX) 20 MG tablet Take 20 mg by mouth 3 (three) times daily.    Historical Provider, MD      VITAL SIGNS:  Blood pressure 70/39, pulse 116, temperature 100.1 F (37.8 C), temperature source Rectal, resp. rate 30, height 5\' 3"  (1.6 m), weight 210 lb (95.255 kg), SpO2 95 %.  PHYSICAL EXAMINATION:   VITAL SIGNS: Filed Vitals:   09/22/2014 0315  BP: 70/39  Pulse: 116  Temp:   Resp: 30   GENERAL:63  y.o.female critically ill HEAD: Normocephalic, atraumatic.  EYES: Pupils equal, round, reactive to light. extraocular muscles intact  No scleral icterus.  MOUTH: Dry mucosal membrane. Dentition intact. No abscess noted.  EAR, NOSE, THROAT: Clear without exudates. No external lesions.  NECK: Supple. No thyromegaly. No nodules. No JVD.  PULMONARY: Coarse breath sounds throughout without frank wheeze rails or rhonci. No use of accessory muscles, Good respiratory effort. good air entry bilaterally CHEST: Nontender to palpation.  CARDIOVASCULAR: S1 and S2.  Tachycardic. No murmurs, rubs, or gallops. 3+ edema. Pedal pulses diminished bilaterally.  GASTROINTESTINAL: Soft, nontender, nondistended. No masses. Positive bowel sounds. No hepatosplenomegaly.  MUSCULOSKELETAL: No swelling, clubbing, or edema. Passive Range of motion full in all extremities.  NEUROLOGIC: Unable to assess given mental status/medical condition SKIN: Large area of erythema entire right flank downwards to vagina, warm to touch otherwise no further lesions, rashes, cyanosis or skin warm PSYCHIATRIC: She is awake, oriented to person, has difficulty providing meaningful information given mental status/medical condition     LABORATORY PANEL:   CBC  Recent Labs Lab 09/13/2014 0045  WBC 5.3  HGB 11.5*  HCT 35.5  PLT 311   ------------------------------------------------------------------------------------------------------------------  Chemistries   Recent Labs Lab 09/25/2014 0045  NA 133*  K 3.5  CL 90*  CO2 28  GLUCOSE 74  BUN 49*  CREATININE 2.36*  CALCIUM 8.2*  AST 70*  ALT 23  ALKPHOS 928*  BILITOT 4.6*   ------------------------------------------------------------------------------------------------------------------  Cardiac Enzymes No results for input(s): TROPONINI in the last 168 hours. ------------------------------------------------------------------------------------------------------------------  RADIOLOGY:  Dg Chest 1 View  09/04/2014   CLINICAL DATA:  Central line placement.  EXAM: CHEST  1 VIEW  COMPARISON:  06/26/2014  FINDINGS: Right IJ central line with tip crossing midline and terminating in the region of the left brachiocephalic vein.  Hypoventilation. When accounting for this there is no cardiomegaly or change in aortic tortuosity. No pneumothorax, edema, effusion, or definitive pneumonia.  These results were called by telephone at the time of interpretation on 09/10/2014 at 3:14 am to Tillar , who verbally acknowledged these results.   IMPRESSION: 1. Right IJ central line with tip crossing midline and projecting at the left brachiocephalic vein. No pneumothorax. 2. Hypoventilation.   Electronically Signed   By: Monte Fantasia M.D.   On: 09/21/2014 03:15   Ct Head Wo Contrast  09/04/2014   CLINICAL DATA:  Altered mental status after fall from couch. Posterior neck pain. Initial encounter.  EXAM: CT HEAD WITHOUT CONTRAST  CT CERVICAL SPINE WITHOUT CONTRAST  TECHNIQUE: Multidetector CT imaging of the head and cervical spine was performed following the standard protocol without intravenous contrast. Multiplanar CT image reconstructions of the cervical spine were also generated.  COMPARISON:  None.  FINDINGS: CT HEAD FINDINGS  Skull and Sinuses:Negative for fracture or destructive process. The mastoids, middle ears, and imaged paranasal sinuses are clear.  Orbits: No acute abnormality.  Brain: No evidence of acute infarction, hemorrhage, hydrocephalus, or mass lesion/mass effect.  CT CERVICAL SPINE FINDINGS  Thyroid gland has indistinct margins with mild surrounding fat stranding. A sub cm dense nodule is noted on the right. No surrounding adenopathy.  No evidence of acute fracture or traumatic malalignment. No gross cervical canal hematoma. There is degenerative disc disease and endplate spurring, most progressed at C4-5, C5-6, and C6-7. Spurring is most notable at C5-6 with right uncovertebral spurs causing advanced foraminal stenosis. A superimposed right paracentral disc herniation with vacuum phenomenon contributes to the C5-6 right foraminal stenosis.  IMPRESSION: 1. No evidence of intracranial or cervical spine injury. 2. Mid and lower cervical degenerative disc disease with foraminal stenoses, most advanced on the right at C5-6. 3. Edema noted around the thyroid gland, suspicious for thyroiditis. Correlate with endocrine history and thyroid function tests.   Electronically Signed   By: Monte Fantasia M.D.   On: 09/20/2014 02:31   Ct  Cervical Spine Wo Contrast  09/24/2014   CLINICAL DATA:  Altered mental status after fall from couch. Posterior neck pain. Initial encounter.  EXAM: CT HEAD WITHOUT CONTRAST  CT CERVICAL SPINE WITHOUT CONTRAST  TECHNIQUE: Multidetector CT imaging of the head and cervical spine was performed following the standard protocol without intravenous contrast. Multiplanar CT image reconstructions of the cervical spine were also generated.  COMPARISON:  None.  FINDINGS: CT HEAD FINDINGS  Skull and Sinuses:Negative for fracture or destructive process. The mastoids, middle ears, and imaged paranasal sinuses are clear.  Orbits: No acute abnormality.  Brain: No evidence of acute infarction, hemorrhage, hydrocephalus, or mass lesion/mass effect.  CT CERVICAL SPINE FINDINGS  Thyroid gland has indistinct margins with mild surrounding fat stranding. A sub cm dense nodule is noted on the right. No surrounding adenopathy.  No evidence of acute fracture or traumatic malalignment. No gross cervical canal hematoma. There is degenerative disc disease and endplate spurring, most progressed at C4-5, C5-6, and C6-7. Spurring is most notable at C5-6 with right uncovertebral spurs causing advanced foraminal stenosis. A superimposed right paracentral disc herniation with vacuum phenomenon contributes to the C5-6 right foraminal stenosis.  IMPRESSION: 1. No evidence of intracranial or cervical spine injury. 2. Mid and lower cervical degenerative disc disease with foraminal stenoses, most advanced on the right at C5-6. 3. Edema noted around the thyroid gland, suspicious for thyroiditis. Correlate with endocrine history and thyroid function tests.   Electronically Signed   By: Monte Fantasia M.D.   On: 09/06/2014 02:31    EKG:   Orders placed or performed during the hospital encounter of 06/26/14  . EKG 12-Lead  . EKG 12-Lead  . ED EKG  (IF patient has COPD)  . ED EKG  (IF patient has COPD)    IMPRESSION AND PLAN:   64 year old  African American female history of nephrotic syndrome, amyloidosis, essential hypertension presenting with altered mental status/confusion after apparently falling out of bed. On arrival to emergency department meeting septic criteria with hypotension.  1. Septic shock: meeting septic criteria by temperature, heart rate present on arrival. Source cellulitis based on with exam Code sepsis initiated. Panculture. Broad-spectrum antibiotics including vancomycin/Zosyn and taper antibiotics when culture data returns. she has received a 30 mL/kg IV fluid bolus. Continue IV fluid hydration to keep mean arterial pressure greater than 65. We will repeat lactic acid given the initial is greater than 2.2. Given lactic acidosis, hypotension suspected general acidosis ABG is pending at this time however were empirically treat 1 and bicarbonate, increased Levophed to 30 mcg, and vasopressin. Once in ICU trend his CVP to goal 8-10.  2. Acute kidney injury: IV fluid hydration as above, goal mean arterial pressure greater than 65, we'll infuse 25 g albumin given nephrotic syndrome with undetectable albumin. 3. Type 2 diabetes, uncomplicated: Hold oral agents, at insulin slide scale every 6 hours Accu-Chek 4. Essential hypertension: Hold all agents 5.Venous thromboembolism prophylactic: Heparin subcutaneous      All the records are reviewed and case discussed with ED provider. Management plans discussed with the patient, family and they are in agreement.  CODE STATUS: Full  TOTAL TIME TAKING CARE OF THIS PATIENT: 55 critical minutes.    Indie Nickerson,  Karenann Cai.D on 09/20/2014 at 3:34 AM  Between 7am to 6pm - Pager - [phone]  After 6pm: House Pager: - [phone]  [facility]  Office  [phone]  CC: Primary care physician; [name], MD

## 2014-09-28 NOTE — Progress Notes (Signed)
Subjective:   Patient known to our practice from out patient. She is followed by Dr Juleen China for amyloidosis She was scheduled to get chemo treatments for amyloidosis but presented to hospital after a fall. She is noted to have low grade fever and reddish rash over rt groin  Currently she is hypotensive and requiring 2 pressors  Objective:  Vital signs in last 24 hours:  Temp:  [97.5 F (36.4 C)-100.1 F (37.8 C)] 97.5 F (36.4 C) (05/25 0500) Pulse Rate:  [102-125] 106 (05/25 1100) Resp:  [17-30] 23 (05/25 1100) BP: (54-101)/(35-77) 90/68 mmHg (05/25 1100) SpO2:  [89 %-97 %] 93 % (05/25 1100) FiO2 (%):  [98 %] 98 % (05/25 0500) Weight:  [95.255 kg (210 lb)-107 kg (235 lb 14.3 oz)] 107 kg (235 lb 14.3 oz) (05/25 0500)  Weight change:  Filed Weights   09/10/2014 0112 09/22/2014 0500  Weight: 95.255 kg (210 lb) 107 kg (235 lb 14.3 oz)    Intake/Output: I/O last 3 completed shifts: In: 324.1 [I.V.:324.1] Out: -      Physical Exam: General: Gen, ill appearing  HEENT Muddy sclera, moist mucus membranes  Neck supple  Pulm/lungs Decreased breath sounds b/l  CVS/Heart Tachycardic, regular  Abdomen:  Soft, non tender  Extremities: +++ anasarca  Neurologic: Alert, follows commands  Skin: Rt groin cellulitis  Access:        Basic Metabolic Panel:  Recent Labs Lab 09/09/2014 0045  NA 133*  K 3.5  CL 90*  CO2 28  GLUCOSE 74  BUN 49*  CREATININE 2.36*  CALCIUM 8.2*     CBC:  Recent Labs Lab 09/06/2014 0045  WBC 5.3  HGB 11.5*  HCT 35.5  MCV 88.0  PLT 311      Microbiology: Results for orders placed or performed during the hospital encounter of 09/20/13  Surgical pcr screen     Status: None   Collection Time: 09/20/13  8:58 AM  Result Value Ref Range Status   MRSA, PCR NEGATIVE NEGATIVE Final   Staphylococcus aureus NEGATIVE NEGATIVE Final    Comment:        The Xpert SA Assay (FDA approved for NASAL specimens in patients over 9 years of age), is one  component of a comprehensive surveillance program.  Test performance has been validated by [employer] for patients greater than or equal to 71 year old. It is not intended to diagnose infection nor to guide or monitor treatment.    Coagulation Studies:  Recent Labs  09/05/2014 0045  LABPROT 27.8*  INR 2.58    Urinalysis:  Recent Labs  09/17/2014 0110  COLORURINE AMBER*  LABSPEC 1.016  PHURINE 5.0  GLUCOSEU NEGATIVE  HGBUR 1+*  BILIRUBINUR 2+*  KETONESUR NEGATIVE  PROTEINUR 100*  NITRITE NEGATIVE  LEUKOCYTESUR NEGATIVE      Imaging: Dg Chest 1 View  09/05/2014   CLINICAL DATA:  Central line placement.  EXAM: CHEST  1 VIEW  COMPARISON:  06/26/2014  FINDINGS: Right IJ central line with tip crossing midline and terminating in the region of the left brachiocephalic vein.  Hypoventilation. When accounting for this there is no cardiomegaly or change in aortic tortuosity. No pneumothorax, edema, effusion, or definitive pneumonia.  These results were called by telephone at the time of interpretation on 09/30/2014 at 3:14 am to Catharine , who verbally acknowledged these results.  IMPRESSION: 1. Right IJ central line with tip crossing midline and projecting at the left brachiocephalic vein. No pneumothorax. 2. Hypoventilation.   Electronically  Signed   By: Monte Fantasia M.D.   On: 09/05/2014 03:15   Ct Head Wo Contrast  09/27/2014   CLINICAL DATA:  Altered mental status after fall from couch. Posterior neck pain. Initial encounter.  EXAM: CT HEAD WITHOUT CONTRAST  CT CERVICAL SPINE WITHOUT CONTRAST  TECHNIQUE: Multidetector CT imaging of the head and cervical spine was performed following the standard protocol without intravenous contrast. Multiplanar CT image reconstructions of the cervical spine were also generated.  COMPARISON:  None.  FINDINGS: CT HEAD FINDINGS  Skull and Sinuses:Negative for fracture or destructive process. The mastoids, middle ears, and imaged paranasal sinuses  are clear.  Orbits: No acute abnormality.  Brain: No evidence of acute infarction, hemorrhage, hydrocephalus, or mass lesion/mass effect.  CT CERVICAL SPINE FINDINGS  Thyroid gland has indistinct margins with mild surrounding fat stranding. A sub cm dense nodule is noted on the right. No surrounding adenopathy.  No evidence of acute fracture or traumatic malalignment. No gross cervical canal hematoma. There is degenerative disc disease and endplate spurring, most progressed at C4-5, C5-6, and C6-7. Spurring is most notable at C5-6 with right uncovertebral spurs causing advanced foraminal stenosis. A superimposed right paracentral disc herniation with vacuum phenomenon contributes to the C5-6 right foraminal stenosis.  IMPRESSION: 1. No evidence of intracranial or cervical spine injury. 2. Mid and lower cervical degenerative disc disease with foraminal stenoses, most advanced on the right at C5-6. 3. Edema noted around the thyroid gland, suspicious for thyroiditis. Correlate with endocrine history and thyroid function tests.   Electronically Signed   By: Monte Fantasia M.D.   On: 09/22/2014 02:31   Ct Cervical Spine Wo Contrast  09/21/2014   CLINICAL DATA:  Altered mental status after fall from couch. Posterior neck pain. Initial encounter.  EXAM: CT HEAD WITHOUT CONTRAST  CT CERVICAL SPINE WITHOUT CONTRAST  TECHNIQUE: Multidetector CT imaging of the head and cervical spine was performed following the standard protocol without intravenous contrast. Multiplanar CT image reconstructions of the cervical spine were also generated.  COMPARISON:  None.  FINDINGS: CT HEAD FINDINGS  Skull and Sinuses:Negative for fracture or destructive process. The mastoids, middle ears, and imaged paranasal sinuses are clear.  Orbits: No acute abnormality.  Brain: No evidence of acute infarction, hemorrhage, hydrocephalus, or mass lesion/mass effect.  CT CERVICAL SPINE FINDINGS  Thyroid gland has indistinct margins with mild  surrounding fat stranding. A sub cm dense nodule is noted on the right. No surrounding adenopathy.  No evidence of acute fracture or traumatic malalignment. No gross cervical canal hematoma. There is degenerative disc disease and endplate spurring, most progressed at C4-5, C5-6, and C6-7. Spurring is most notable at C5-6 with right uncovertebral spurs causing advanced foraminal stenosis. A superimposed right paracentral disc herniation with vacuum phenomenon contributes to the C5-6 right foraminal stenosis.  IMPRESSION: 1. No evidence of intracranial or cervical spine injury. 2. Mid and lower cervical degenerative disc disease with foraminal stenoses, most advanced on the right at C5-6. 3. Edema noted around the thyroid gland, suspicious for thyroiditis. Correlate with endocrine history and thyroid function tests.   Electronically Signed   By: Monte Fantasia M.D.   On: 09/25/2014 02:31   Dg Chest Portable 1 View  09/05/2014   CLINICAL DATA:  Central line adjustment  EXAM: PORTABLE CHEST - 1 VIEW  COMPARISON:  Same day at 2:43 a.m.  FINDINGS: Shortening of the right IJ central line, but tip still terminates left of midline near the brachiocephalic vein.  Hypoventilation.  Stable heart size and mediastinal contours, size accentuated by technique. There is interstitial crowding without edema, effusion, pneumothorax, or overt pneumonia.  IMPRESSION: 1. Despite right central line shortening the tip still terminates near the left brachiocephalic vein. 2. Hypoventilation.   Electronically Signed   By: Monte Fantasia M.D.   On: 09/26/2014 04:28     Medications:   . sodium chloride 1,000 mL (09/29/2014 0258)  . sodium chloride 125 mL/hr at 09/25/2014 0500  . sodium chloride Stopped (09/26/2014 0549)  . norepinephrine (LEVOPHED) Adult infusion    . vasopressin (PITRESSIN) infusion - *FOR SHOCK* 0.03 Units/min (09/21/2014 0503)   . antiseptic oral rinse  7 mL Mouth Rinse q12n4p  . chlorhexidine  15 mL Mouth Rinse BID   . heparin  5,000 Units Subcutaneous 3 times per day  . midodrine  10 mg Oral TID  . piperacillin-tazobactam (ZOSYN)  IV  3.375 g Intravenous 3 times per day  . vancomycin  1,250 mg Intravenous Q24H   acetaminophen **OR** acetaminophen, morphine injection, ondansetron **OR** ondansetron (ZOFRAN) IV, polyethylene glycol powder  Assessment/ Plan:  64 y.o. African [place] female with diabetes mellitus type 2, hypertension, morbid obesity, osteoarthritis, status post right knee surgery, nephrotic syndrome with edema, hypoalbuminemia, renal insufficiency and proteinuria.  1. ARF with CKD st 3 . Baseline cr 1.3. (08/2014)  2. Nephrotic Syndrome with proteinuria: diagnosis of Renal Amyloidosis.  3. Generalized Edema 4. Hypoalbuminemia 5. Right groin cellulitis /sepsis  6. Diabetes Mellitus type II 7. Hypotension  Very difficult situation. Hypotension and edema are due to 3rd spacing from underlying cause of amyloidosis Until definitive treatment can be done, will have to provide oncotic support with iv albumin recognizing that it is a temporizing measure. Will get vascular consult for temporary dialysis cathter placement.  Considering CRRT starting tomorrow unless renal function is signifcantally improved. At present UOP is 85 cc over last 12 hrs Dose meds for Cr Cl < 10    LOS: 0 [place] 5/25/201611:53 AM

## 2014-09-28 NOTE — ED Notes (Signed)
Pt to ct; accompanied by Genevive Bi, RN

## 2014-09-28 NOTE — Op Note (Signed)
  OPERATIVE NOTE   PROCEDURE: 1. Insertion of temporary dialysis catheter catheter right femoral approach.  PRE-OPERATIVE DIAGNOSIS: Acute renal failure  POST-OPERATIVE DIAGNOSIS: Same  SURGEON: Katha Cabal M.D.  ANESTHESIA: 1% lidocaine local infiltration  ESTIMATED BLOOD LOSS: Minimal cc  INDICATIONS:   [name] is a 64 y.o. female who presents with worsening renal failure and volume overload. She will require hemodialysis urgently and therefore temporary catheter is selected.  DESCRIPTION: After obtaining full informed written consent, the patient was positioned supine. The right groin was prepped and draped in a sterile fashion. Ultrasound was placed in a sterile sleeve. Ultrasound was utilized to identify the femoral vein which is noted to be echolucent and compressible indicating patency. Images recorded for the permanent record. Under real-time visualization a Seldinger needle is inserted into the vein and the guidewires advanced without difficulty. Small counterincision was made at the wire insertion site. Dilator is passed over the wire and the temporary dialysis catheter catheter is fed over the wire without difficulty.  All lumens aspirate and flush easily and are packed with heparin saline. Catheter secured to the skin of the thigh with 2-0 silk. A sterile dressing is applied with Biopatch.  COMPLICATIONS: None  CONDITION: Unchanged  Katha Cabal, M.D. [facility]. Office:  [phone]

## 2014-09-28 NOTE — Progress Notes (Signed)
  [facility] NOTE  Pharmacy Consult for Antibiotic Dosing, Electrolyte Management  Indication: Septic Shock, ICU Status   No Known Allergies  Patient Measurements: Height: 5\' 1"  (154.9 cm) Weight: 235 lb 14.3 oz (107 kg) IBW/kg (Calculated) : 47.8   Vital Signs: Temp: 97.5 F (36.4 C) (05/25 0500) Temp Source: Oral (05/25 0500) BP: 90/68 mmHg (05/25 1100) Pulse Rate: 106 (05/25 1100) Intake/Output from previous day: 05/24 0701 - 05/25 0700 In: 324.1 [I.V.:324.1] Out: -  Intake/Output from this shift: Total I/O In: 230.1 [I.V.:230.1] Out: -  Vent settings for last 24 hours: Vent Mode:  [-]  FiO2 (%):  [98 %] 98 %  Labs:  Recent Labs  09/30/2014 0045  WBC 5.3  HGB 11.5*  HCT 35.5  PLT 311  INR 2.58  CREATININE 2.36*  ALBUMIN <1.0*  PROT 4.7*  AST 70*  ALT 23  ALKPHOS 928*  BILITOT 4.6*   Estimated Creatinine Clearance: 26.8 mL/min (by C-G formula based on Cr of 2.36).   Recent Labs  09/19/2014 0442 09/14/2014 0537  GLUCAP 48* 80    Microbiology: No results found for this or any previous visit (from the past 720 hour(s)).  Medications:  Scheduled:  . antiseptic oral rinse  7 mL Mouth Rinse q12n4p  . chlorhexidine  15 mL Mouth Rinse BID  . heparin  5,000 Units Subcutaneous 3 times per day  . midodrine  10 mg Oral TID  . piperacillin-tazobactam (ZOSYN)  IV  3.375 g Intravenous 3 times per day  . vancomycin  1,250 mg Intravenous Q24H   Infusions:  . sodium chloride 1,000 mL (09/11/2014 0258)  . sodium chloride 125 mL/hr at 09/22/2014 0500  . sodium chloride Stopped (09/30/2014 0549)  . norepinephrine (LEVOPHED) Adult infusion    . vasopressin (PITRESSIN) infusion - *FOR SHOCK* 0.03 Units/min (09/07/2014 0503)    Assessment: 64 yo female admitted to ICU for septic shock requiring norepinephrine and vasopressin. Patient on day 1 of Vancomycin and Zosyn.   Plan:   1. Antibiotics: Patient currently ordered vancomycin 1g IV Q24hr for  goal trough of 15-20 and Zosyn EI 3.375g IV Q8hr. Will monitor renal function closely. Will obtain trough prior to dose on 5/28.    2. Electrolytes: Electrolytes WNL. Will recheck with am labs.   Pharmacy will continue to monitor and adjust per consult.    Theola Cuellar L 09/07/2014,11:55 AM

## 2014-09-28 NOTE — Progress Notes (Signed)
Call to Dr. Darvin Neighbours to advise all 4 bottles Blood Cultures (+) gram (+) cocci in chains.

## 2014-09-28 NOTE — ED Provider Notes (Signed)
[facility] Emergency Department Provider Note  ____________________________________________  Time seen: 12:45 AM  I have reviewed the triage vital signs and the nursing notes.   HISTORY  Chief Complaint Altered Mental Status  History Limited by altered mental status    HPI [name] is a 64 y.o. female presents via EMS with history per EMS patient "rolled out of bed".  Patient noted to be febrile, hypotensive and tachycardic on presentation emergency. Patient markedly confused.     Past Medical History  Diagnosis Date  . HTN (hypertension)   . Diabetes mellitus type II   . Obesity   . Arthritis   . Chronic kidney disease   . Amyloidosis     Patient Active Problem List   Diagnosis Date Noted  . Septic shock 09/15/2014  . Encounter for Medicare annual wellness exam 09/20/2014  . Amyloidosis   . Nephrotic syndrome 06/28/2014  . Renal insufficiency 06/28/2014  . Acute renal failure 06/26/2014  . Dehydration 06/26/2014  . Orthostatic hypotension 06/26/2014  . Positive D dimer 06/26/2014  . Bilateral lower extremity edema 06/26/2014  . Hypokalemia 06/26/2014  . Dyspnea 06/26/2014  . Pedal edema 06/07/2014  . Tinea pedis 06/07/2014  . Shortness of breath 01/14/2014  . Right arm pain 01/11/2014  . Exercise intolerance 01/11/2014  . S/P right TKA 09/28/2013  . Deformity of toenail 06/14/2013  . Other screening mammogram 12/18/2010  . Routine general medical examination at a health care facility 12/08/2010  . Osteoarthritis of knee 09/13/2010  . Knee pain, bilateral 09/11/2010  . Obesity   . Diabetes type 2, controlled 02/26/2010  . Essential hypertension 08/29/2009    Past Surgical History  Procedure Laterality Date  . Total abdominal hysterectomy  10/1999    fibroids  . Combined abdominoplasty and liposuction  07/2000  . Total knee arthroplasty Right 09/28/2013    Procedure: RIGHT TOTAL KNEE ARTHROPLASTY;  Surgeon: Mauri Pole, MD;  Location: [facility] ORS;  Service: Orthopedics;  Laterality: Right;    Current Outpatient Rx  Name  Route  Sig  Dispense  Refill  . metolazone (ZAROXOLYN) 5 MG tablet   Oral   Take 5 mg by mouth daily.         . midodrine (PROAMATINE) 10 MG tablet   Oral   Take 10 mg by mouth 3 (three) times daily.         . naftifine (NAFTIN) 1 % cream   Topical   Apply topically daily. To affected areas/feet Patient taking differently: Apply 1 application topically daily as needed (affected areas on feet.).    90 g   1   . polyethylene glycol powder (GLYCOLAX/MIRALAX) powder   Oral   Take 17 g by mouth daily as needed for moderate constipation.   3350 g   1   . potassium chloride SA (K-DUR,KLOR-CON) 20 MEQ tablet   Oral   Take 1 tablet (20 mEq total) by mouth daily.   90 tablet   3   . torsemide (DEMADEX) 20 MG tablet   Oral   Take 20 mg by mouth 3 (three) times daily.           Allergies Review of patient's allergies indicates no known allergies.  Family History  Problem Relation Age of Onset  . Arthritis Mother   . Hypertension Mother   . Cancer Mother     breast, lung  . Diabetes Mother   . Hypertension Father     Social History  History  Substance Use Topics  . Smoking status: Never Smoker   . Smokeless tobacco: Never Used  . Alcohol Use: No    Review of Systems  Constitutional: Positive for fever. Eyes: Negative for visual changes. ENT: Negative for sore throat. Cardiovascular: Negative for chest pain. Respiratory: Negative for shortness of breath. Gastrointestinal: Negative for abdominal pain, vomiting and diarrhea. Genitourinary: Negative for dysuria. Musculoskeletal: Negative for back pain. Skin: Negative for rash. Neurological: Negative for headaches, focal weakness or numbness. Psychiatric: Positive confusion  10-point ROS otherwise negative.  ____________________________________________   PHYSICAL EXAM:  VITAL SIGNS: ED Triage  Vitals  Enc Vitals Group     BP 09/09/2014 0112 54/35 mmHg     Pulse Rate 09/27/2014 0112 107     Resp 09/06/2014 0112 24     Temp 09/19/2014 0112 100.1 F (37.8 C)     Temp Source 09/27/2014 0112 Rectal     SpO2 --      Weight 09/17/2014 0112 210 lb (95.255 kg)     Height 09/10/2014 0112 5\' 3"  (1.6 m)     Head Cir --      Peak Flow --      Pain Score 09/08/2014 0114 0     Pain Loc --      Pain Edu? --      Excl. in [place]? --    Constitutional: Alert and oriented. Well appearing and in no distress. Eyes: Conjunctivae are normal. PERRL. Normal extraocular movements. ENT   Head: Normocephalic and atraumatic.   Nose: No congestion/rhinnorhea.   Mouth/Throat: Mucous membranes are moist.   Neck: No stridor. Hematological/Lymphatic/Immunilogical: No cervical lymphadenopathy. Cardiovascular: Normal rate, regular rhythm. Normal and symmetric distal pulses are present in all extremities. No murmurs, rubs, or gallops. Respiratory: Normal respiratory effort without tachypnea nor retractions. Breath sounds are clear and equal bilaterally. No wheezes/rales/rhonchi. Gastrointestinal: Soft and nontender. No distention. There is no CVA tenderness. Genitourinary: deferred Musculoskeletal: Nontender with normal range of motion in all extremities. No joint effusions.  No lower extremity tenderness nor edema. Neurologic:  Normal speech and language. No gross focal neurologic deficits are appreciated. Speech is normal.  Skin:  Blanching erythema noted on abdomen extending down to perineum consistent with cellulitis Psychiatric: Mood and affect are normal. Speech and behavior are normal. Patient exhibits appropriate insight and judgment.  ____________________________________________    LABS (pertinent positives/negatives)  Labs Reviewed  CBC - Abnormal; Notable for the following:    Hemoglobin 11.5 (*)    RDW 19.1 (*)    All other components within normal limits  COMPREHENSIVE METABOLIC PANEL -  Abnormal; Notable for the following:    Sodium 133 (*)    Chloride 90 (*)    BUN 49 (*)    Creatinine, Ser 2.36 (*)    Calcium 8.2 (*)    Total Protein 4.7 (*)    Albumin <1.0 (*)    AST 70 (*)    Alkaline Phosphatase 928 (*)    Total Bilirubin 4.6 (*)    GFR calc non Af Amer 20 (*)    GFR calc Af Amer 24 (*)    All other components within normal limits  URINALYSIS COMPLETEWITH MICROSCOPIC ([facility])  - Abnormal; Notable for the following:    Color, Urine AMBER (*)    APPearance CLOUDY (*)    Bilirubin Urine 2+ (*)    Hgb urine dipstick 1+ (*)    Protein, ur 100 (*)    Bacteria, UA RARE (*)  Squamous Epithelial / LPF 0-5 (*)    All other components within normal limits  PROTIME-INR - Abnormal; Notable for the following:    Prothrombin Time 27.8 (*)    All other components within normal limits  LACTIC ACID, PLASMA - Abnormal; Notable for the following:    Lactic Acid, Venous 5.7 (*)    All other components within normal limits  BLOOD GAS, ARTERIAL - Abnormal; Notable for the following:    pO2, Arterial 49 (*)    All other components within normal limits  CULTURE, BLOOD (ROUTINE X 2)  CULTURE, BLOOD (ROUTINE X 2)  CBC  CREATININE, SERUM     ____________________________________________   EKG   Date: 09/26/2014  Rate: 109  Rhythm: Sinus tachycardia  QRS Axis: normal  Intervals: normal  ST/T Wave abnormalities: normal  Conduction Disutrbances: none  Narrative Interpretation: unremarkable      ____________________________________________    RADIOLOGY right IJ terminating in the brachiocephalic vein.  ____________________________________________   PROCEDURES  Procedure(s) performed: Right internal jugular central venous line inserted by me  Critical Care performed:CRITICAL CARE Performed by: Gregor Hams   Total critical care time: 90 Critical care time was exclusive of separately billable procedures and treating other patients.  Critical  care was necessary to treat or prevent imminent or life-threatening deterioration.  Critical care was time spent personally by me on the following activities: development of treatment plan with patient and/or surrogate as well as nursing, discussions with consultants, evaluation of patient's response to treatment, examination of patient, obtaining history from patient or surrogate, ordering and performing treatments and interventions, ordering and review of laboratory studies, ordering and review of radiographic studies, pulse oximetry and re-evaluation of patient's condition.   ____________________________________________   INITIAL IMPRESSION / ASSESSMENT AND PLAN / ED COURSE  Pertinent labs & imaging results that were available during my care of the patient were reviewed by me and considered in my medical decision making (see chart for details).  History of physical exam, lab data consistent with sepsis, cellulitis. Patient received 30 mL/kg of IV normal saline, IV vancomycin 1 g, IV Zosyn 3.375 mg. central venous access terminating near the  brachiocephalic vein. As such line was changed via the Seldinger technique however despite his intervention repeat x-ray revealed that catheter was still located in the  brachiocephalic vein. Hospitalist staff notified who stated that he would have fluoroscopy guided readjustment.  ____________________________________________   FINAL CLINICAL IMPRESSION(S) / ED DIAGNOSES  Final diagnoses:  Sepsis, due to unspecified organism  Abdominal wall cellulitis      Gregor Hams, MD 09/24/2014 [phone]

## 2014-09-28 NOTE — Progress Notes (Signed)
Patient daughter at bedside with update of patient status. Ms Benham was to receive her first round of chemotherapy with Dr. Grayland Ormond at 09:00 today. Called and updated [place] that patient is here in ICU.  Dgt states patient has been hypotensive with amyloidosis, SBP usually beetween 90-100. Dr. Rolly Salter also manages patient concerning her B/P and medications.

## 2014-09-28 NOTE — Care Management (Signed)
Patient was to have received first chemo treatment today for her Amyloidosis.  She has stage 3 kidney disease and it is anticipated she is going to require crrt. She is requiring levophed and vasopressin for hypotension

## 2014-09-28 NOTE — ED Notes (Signed)
Pt via ems from home after falling on the couch; pt has reduced LOC. Pt is oriented but very drowsy.

## 2014-09-28 NOTE — Progress Notes (Addendum)
Pt lying in bed rested quietly , pt has no co of pain or distress currently, on arrival BS was 48 pt treated with protocol and rechecked 80. Pt on levophed , vasopressin, blood pressure improved

## 2014-09-28 NOTE — Progress Notes (Signed)
Pharmacy consulted to dose vancomycin in this 64yo F admitted for septic shock from possible cellulitis per note, and being treated with zosyn 3.375gm IV Q8hrs and vancomycin 1250mg  IV Q24hrs. Currently requiring norepinephrine and vasopressin to maintain pressure.   Norepinephrine rate: 67mcg/min Vasopressin rate: 0.03unit/min   Consider addition of hydrocortisone 50mg  IV Q6hrs if continued signs of septic shock. Consider addition of 3rd vasopressor if NE doses greater than 6mcg/min required. Continue current antibiotics of vancomycin and zosyn. Trough ordered for 5/28  Pharmacy will continue to follow.  [id number] [place], [place] D., BCPS 09/13/2014

## 2014-09-28 NOTE — ED Notes (Signed)
Levophed titrated to 30 mcg/min per Dr. Lavetta Nielsen.

## 2014-09-28 NOTE — Progress Notes (Signed)
Pharmacy Consult for CRRT  Medication Adjustment Indication: Septic Shock, ICU Status   No Known Allergies  Patient Measurements: Height: 5\' 1"  (154.9 cm) Weight: 235 lb 14.3 oz (107 kg) IBW/kg (Calculated) : 47.8   Vital Signs: Temp: 97.7 F (36.5 C) (05/25 1100) Temp Source: Oral (05/25 1100) BP: 90/68 mmHg (05/25 1100) Pulse Rate: 106 (05/25 1100) Intake/Output from previous day: 05/24 0701 - 05/25 0700 In: 324.1 [I.V.:324.1] Out: -  Intake/Output from this shift: Total I/O In: 1079.7 [I.V.:1079.7] Out: 85 [Urine:85] Vent settings for last 24 hours: Vent Mode:  [-]  FiO2 (%):  [98 %] 98 %  Labs:  Recent Labs  09/16/2014 0045 09/11/2014 1403  WBC 5.3  --   HGB 11.5*  --   HCT 35.5  --   PLT 311  --   INR 2.58  --   CREATININE 2.36* 2.31*  ALBUMIN <1.0* 1.0*  PROT 4.7* 4.2*  AST 70* 75*  ALT 23 19  ALKPHOS 928* 656*  BILITOT 4.6* 5.8*   Estimated Creatinine Clearance: 27.4 mL/min (by C-G formula based on Cr of 2.31).   Recent Labs  09/08/2014 0442 10/04/2014 0537  GLUCAP 48* 80    Microbiology: Recent Results (from the past 720 hour(s))  Blood culture (routine x 2)     Status: None (Preliminary result)   Collection Time: 10/03/2014 12:45 AM  Result Value Ref Range Status   Specimen Description BLOOD  Final   Special Requests NONE  Final   Culture  Setup Time   Final    GRAM POSITIVE COCCI IN CHAINS IN BOTH AEROBIC AND ANAEROBIC BOTTLES CRITICAL RESULT CALLED TO, READ BACK BY AND VERIFIED WITH: PAM CRAWFORD AT 1406 10/03/2014 DV    Culture GRAM POSITIVE COCCI IDENTIFICATION TO FOLLOW   Final   Report Status PENDING  Incomplete  Blood culture (routine x 2)     Status: None (Preliminary result)   Collection Time: 09/08/2014 12:45 AM  Result Value Ref Range Status   Specimen Description BLOOD  Final   Special Requests NONE  Final   Culture  Setup Time   Final    GRAM POSITIVE COCCI IN CHAINS IN BOTH AEROBIC AND ANAEROBIC BOTTLES CRITICAL RESULT  CALLED TO, READ BACK BY AND VERIFIED WITH: PAM CRAWFORD AT 1406 09/27/2014 DV    Culture GRAM POSITIVE COCCI IDENTIFICATION TO FOLLOW   Final   Report Status PENDING  Incomplete    Medications:  Scheduled:  . antiseptic oral rinse  7 mL Mouth Rinse q12n4p  . chlorhexidine  15 mL Mouth Rinse BID  . feeding supplement (RESOURCE BREEZE)  1 Container Oral BID BM  . heparin  5,000 Units Subcutaneous 3 times per day  . midodrine  10 mg Oral TID  . piperacillin-tazobactam (ZOSYN)  IV  3.375 g Intravenous 3 times per day  . potassium chloride  10 mEq Intravenous Q1 Hr x 4  . vancomycin  1,000 mg Intravenous Q24H   Infusions:  . sodium chloride 1,000 mL (09/22/2014 0258)  . sodium chloride 125 mL/hr at 09/09/2014 0500  . sodium chloride Stopped (09/23/2014 0549)  . norepinephrine (LEVOPHED) Adult infusion 24.533 mcg/min (09/07/2014 1203)  . vasopressin (PITRESSIN) infusion - *FOR SHOCK* 0.03 Units/min (09/19/2014 0503)    Assessment: 64 yo female admitted to ICU for septic shock requiring norepinephrine and vasopressin. Patient on day 1 of Vancomycin and Zosyn.   Plan:   Current dosing of Vancomycin 1 gram IV Q24h and Zosyn EI 3.375gm IV Q8h is  appropriate for CRRT. Other medications do not appear to require adjustment at this time.   Pharmacy will continue to monitor and adjust per consult.    Chinita Greenland PharmD Clinical Pharmacist 09/22/2014

## 2014-09-28 NOTE — Progress Notes (Signed)
Patient alert and oriented x 4, lethargic. ST on cardiac monitor, 2L n/c, SATs 94%. Right IJ TLC, levophed and vasopressin infusing, levophed titrated upward this shift, spoke with dr. Mortimer Fries to advise Levophed at 55 mcg, MD order to add Neosynephrine, order placed. Also MD order for ABG. Right femoral vascath placed today, CRRT initiated at 1630, CVVHD, 2.5 TF, 300 BF, 100 UF. Potassium 2.8, IV replacement and 4K potassium bath with CRRT. Carb control diet, poor appetite. Patient states she normally stands up to eat, not able to with hypotension and CRRT initiated. Foley catheter, poor UOP, dr. Candiss Norse aware.  Patient son and daughter in law at bedside, updated on patient status and POC.

## 2014-09-28 NOTE — Progress Notes (Signed)
Initial Nutrition Assessment  DOCUMENTATION CODES:     INTERVENTION:   (Medical Nutrition Supplement: Recommend boost breeze BID between meals, Meals and snacks: Cater to pt  prefernces)  NUTRITION DIAGNOSIS:  Inadequate oral intake related to acute illness as evidenced by meal completion < 50%.    GOAL:  Patient will meet greater than or equal to 90% of their needs    MONITOR:   (Medical Nutrition Supplement, Electrolyte and renal profile, Anthropometric)  REASON FOR ASSESSMENT:  Malnutrition Screening Tool    ASSESSMENT:  Pt admitted after fall out of bed, sepsis, hypotension, cellulitis  Past Medical History  Diagnosis Date  . HTN (hypertension)   . Diabetes mellitus type II   . Obesity   . Arthritis   . Chronic kidney disease   . Amyloidosis    Electrolyte and Renal Profile:    Recent Labs Lab 09/30/2014 0045  BUN 49*  CREATININE 2.36*  NA 133*  K 3.5   Glucose Profile:  Recent Labs  09/25/2014 0442 09/29/2014 0537  GLUCAP 48* 80   Medications: levophed, vassopressin, NS at 155ml/hr, human albumin  Pt reports best meal is breakfast each day.  At lunch and dinner typically eats few bites secondary to edema and feeling full quickly with intake.  Dtr reports pt eats like a bird after breakfast. Dtr reports this has been going since end of Feb/March.   Height:  Ht Readings from Last 1 Encounters:  09/18/2014 5\' 1"  (1.549 m)    Weight:  Wt Readings from Last 1 Encounters:  09/27/2014 235 lb 14.3 oz (107 kg)    Daughter reports  weight gain and loss secondary to fluid issues.    Wt Readings from Last 10 Encounters:  09/22/2014 235 lb 14.3 oz (107 kg)  09/20/14 216 lb 4 oz (98.09 kg)  09/13/14 210 lb 15.7 oz (95.7 kg)  07/06/14 216 lb 12.8 oz (98.34 kg)  06/24/14 216 lb 8 oz (98.204 kg)  06/07/14 209 lb 12.8 oz (95.165 kg)  03/16/14 194 lb 8 oz (88.225 kg)  01/14/14 197 lb 12 oz (89.699 kg)  01/11/14 193 lb 12 oz (87.884 kg)  09/28/13 203 lb  (92.08 kg)   Nutrition-Focused physical exam completed. Findings are WDL for fat depletion, muscle depletion, and  Moderate for edema.   BMI:  Body mass index is 44.59 kg/(m^2).  Estimated Nutritional Needs:  Kcal:  BEE 962 kcals (IF 1.0-1.2, AF 1.3)  1250-1500 kcals/d Using IBW of 48kg  Protein:  1.0-1.2 g/d (48-58 g/d) Using IBW of 48kg  Fluid:  (25-15ml/kg) 1200-1435ml/d  Skin:  Reviewed, no issues  Diet Order:  Diet Carb Modified Fluid consistency:: Thin; Room service appropriate?: Yes  EDUCATION NEEDS:  No education needs identified at this time   Intake/Output Summary (Last 24 hours) at 09/27/2014 1319 Last data filed at 09/11/2014 1203  Gross per 24 hour  Intake 1403.8 ml  Output     85 ml  Net 1318.8 ml    Last BM:  5/24  MODERATE Care Level Yaniel Limbaugh B. Zenia Resides, [place], [place] (pager)

## 2014-09-29 ENCOUNTER — Inpatient Hospital Stay (HOSPITAL_COMMUNITY)
Admit: 2014-09-29 | Discharge: 2014-09-29 | Disposition: A | Discharge status: Home / Self Care | Payer: Medicare Other | Attending: Internal Medicine | Admitting: Internal Medicine

## 2014-09-29 DIAGNOSIS — I129 Hypertensive chronic kidney disease with stage 1 through stage 4 chronic kidney disease, or unspecified chronic kidney disease: Secondary | ICD-10-CM

## 2014-09-29 DIAGNOSIS — N183 Chronic kidney disease, stage 3 (moderate): Secondary | ICD-10-CM

## 2014-09-29 DIAGNOSIS — E8809 Other disorders of plasma-protein metabolism, not elsewhere classified: Secondary | ICD-10-CM

## 2014-09-29 DIAGNOSIS — D72829 Elevated white blood cell count, unspecified: Secondary | ICD-10-CM

## 2014-09-29 DIAGNOSIS — Z515 Encounter for palliative care: Secondary | ICD-10-CM

## 2014-09-29 DIAGNOSIS — R509 Fever, unspecified: Secondary | ICD-10-CM

## 2014-09-29 LAB — RENAL FUNCTION PANEL
ANION GAP: 12 (ref 5–15)
Albumin: <1 g/dL — ABNORMAL LOW (ref 3.5–5.0)
Anion gap: 10 (ref 5–15)
BUN: 28 mg/dL — ABNORMAL HIGH (ref 6–20)
BUN: 24 mg/dL — AB (ref 6–20)
CALCIUM: 7.8 mg/dL — AB (ref 8.9–10.3)
CO2: 23 mmol/L (ref 22–32)
CO2: 23 mmol/L (ref 22–32)
Calcium: 7.4 mg/dL — ABNORMAL LOW (ref 8.9–10.3)
Chloride: 99 mmol/L — ABNORMAL LOW (ref 101–111)
Chloride: 98 mmol/L — ABNORMAL LOW (ref 101–111)
Creatinine, Ser: 1.53 mg/dL — ABNORMAL HIGH (ref 0.44–1.00)
Creatinine, Ser: 1.34 mg/dL — ABNORMAL HIGH (ref 0.44–1.00)
GFR calc Af Amer: 40 mL/min — ABNORMAL LOW (ref >60)
GFR calc Af Amer: 47 mL/min — ABNORMAL LOW (ref >60)
GFR calc non Af Amer: 35 mL/min — ABNORMAL LOW (ref >60)
GFR calc non Af Amer: 41 mL/min — ABNORMAL LOW (ref >60)
GLUCOSE: 89 mg/dL (ref 65–99)
GLUCOSE: 104 mg/dL — AB (ref 65–99)
PHOSPHORUS: 3.5 mg/dL (ref 2.5–4.6)
PHOSPHORUS: 2.9 mg/dL (ref 2.5–4.6)
Potassium: 4 mmol/L (ref 3.5–5.1)
Potassium: 4.3 mmol/L (ref 3.5–5.1)
SODIUM: 133 mmol/L — AB (ref 135–145)
Sodium: 132 mmol/L — ABNORMAL LOW (ref 135–145)

## 2014-09-29 LAB — BLOOD GAS, ARTERIAL
Acid-base deficit: 3 mmol/L — ABNORMAL HIGH (ref 0.0–2.0)
Allens test (pass/fail): POSITIVE — AB
Bicarbonate: 21.1 mEq/L (ref 21.0–28.0)
FIO2: 0.28 %
O2 Saturation: 91 %
PO2 ART: 61 mmHg — AB (ref 83.0–108.0)
Patient temperature: 37
pCO2 arterial: 34 mmHg (ref 32.0–48.0)
pH, Arterial: 7.4 (ref 7.350–7.450)

## 2014-09-29 LAB — CBC
HEMATOCRIT: 33.5 % — AB (ref 35.0–47.0)
Hemoglobin: 10.9 g/dL — ABNORMAL LOW (ref 12.0–16.0)
MCH: 29 pg (ref 26.0–34.0)
MCHC: 32.6 g/dL (ref 32.0–36.0)
MCV: 89.1 fL (ref 80.0–100.0)
Platelets: 204 10*3/uL (ref 150–440)
RBC: 3.76 MIL/uL — ABNORMAL LOW (ref 3.80–5.20)
RDW: 19.9 % — AB (ref 11.5–14.5)
WBC: 21.4 10*3/uL — ABNORMAL HIGH (ref 3.6–11.0)

## 2014-09-29 LAB — BASIC METABOLIC PANEL
ANION GAP: 8 (ref 5–15)
BUN: 31 mg/dL — AB (ref 6–20)
CHLORIDE: 101 mmol/L (ref 101–111)
CO2: 24 mmol/L (ref 22–32)
Calcium: 7.6 mg/dL — ABNORMAL LOW (ref 8.9–10.3)
Creatinine, Ser: 1.53 mg/dL — ABNORMAL HIGH (ref 0.44–1.00)
GFR calc Af Amer: 40 mL/min — ABNORMAL LOW (ref >60)
GFR calc non Af Amer: 35 mL/min — ABNORMAL LOW (ref >60)
Glucose, Bld: 82 mg/dL (ref 65–99)
Potassium: 4 mmol/L (ref 3.5–5.1)
Sodium: 133 mmol/L — ABNORMAL LOW (ref 135–145)

## 2014-09-29 LAB — MAGNESIUM: Magnesium: 1.4 mg/dL — ABNORMAL LOW (ref 1.7–2.4)

## 2014-09-29 LAB — PHOSPHORUS: Phosphorus: 3.7 mg/dL (ref 2.5–4.6)

## 2014-09-29 LAB — APTT: APTT: 54 s — AB (ref 24–36)

## 2014-09-29 MED ORDER — MAGNESIUM SULFATE 2 GM/50ML IV SOLN
2.0000 g | Freq: Once | INTRAVENOUS | Status: AC
  Administered 2014-09-29: 2 g via INTRAVENOUS
  Filled 2014-09-29: qty 50

## 2014-09-29 MED ORDER — HYDROCORTISONE NA SUCCINATE PF 100 MG IJ SOLR
50.0000 mg | Freq: Four times a day (QID) | INTRAMUSCULAR | Status: DC
  Administered 2014-09-29 – 2014-10-03 (×16): 50 mg via INTRAVENOUS
  Filled 2014-09-29 (×16): qty 2

## 2014-09-29 NOTE — Progress Notes (Signed)
[place] [facility] [place] NAME: [name]    MR#:  [id number]  DATE OF BIRTH:  [birth year]  SUBJECTIVE:  CHIEF COMPLAINT:   Chief Complaint  Patient presents with  . Altered Mental Status    No SOB. Drowzy  REVIEW OF SYSTEMS:    ROS  Unable to give history due to encephalopathy     DRUG ALLERGIES:  No Known Allergies  VITALS:  Blood pressure 72/57, pulse 101, temperature 97.5 F (36.4 C), temperature source Oral, resp. rate 18, height 5\' 1"  (1.549 m), weight 111.449 kg (245 lb 11.2 oz), SpO2 96 %.  PHYSICAL EXAMINATION:   Physical Exam  GENERAL:  64 y.o.-year-old patient lying in the bed drowzy. EYES: Pupils equal, round, reactive to light and accommodation. No scleral icterus. Extraocular muscles intact. HEENT: Head atraumatic, normocephalic. Oropharynx and nasopharynx clear. NECK:  Supple, no jugular venous distention. No thyroid enlargement, no tenderness. LUNGS: Normal breath sounds bilaterally, no wheezing, rales, rhonchi. No use of accessory muscles of respiration. CARDIOVASCULAR: S1, S2 normal. No murmurs, rubs, or gallops. ABDOMEN: Soft, nontender, nondistended. Bowel sounds present. No organomegaly or mass. EXTREMITIES: No cyanosis, clubbing. Anasarca. NEUROLOGIC: Drowzy.lethargic. Moves all extremities. PSYCHIATRIC: The patient is drowzy. SKIN:  Erythema with warmth right abdominal wall, groin. Blister lateral thigh  LABORATORY PANEL:   CBC  Recent Labs Lab 09/29/14 0437  WBC 21.4*  HGB 10.9*  HCT 33.5*  PLT 204   ------------------------------------------------------------------------------------------------------------------  Chemistries   Recent Labs Lab 09/26/2014 1403  09/29/14 0409 09/29/14 0830  NA 134*  < > 133* 132*  K 2.8*  < > 4.0 4.0  CL 97*  < > 101 99*  CO2 21*  < > 24 23  GLUCOSE 86  < > 82 89  BUN 47*  < > 31* 28*  CREATININE 2.31*  < > 1.53* 1.53*  CALCIUM 7.1*  < > 7.6*  7.4*  MG  --   --  1.4*  --   AST 75*  --   --   --   ALT 19  --   --   --   ALKPHOS 656*  --   --   --   BILITOT 5.8*  --   --   --   < > = values in this interval not displayed. ------------------------------------------------------------------------------------------------------------------  Cardiac Enzymes No results for input(s): TROPONINI in the last 168 hours. ------------------------------------------------------------------------------------------------------------------  RADIOLOGY:  Dg Chest 1 View  09/29/2014   CLINICAL DATA:  Central line placement.  EXAM: CHEST  1 VIEW  COMPARISON:  06/26/2014  FINDINGS: Right IJ central line with tip crossing midline and terminating in the region of the left brachiocephalic vein.  Hypoventilation. When accounting for this there is no cardiomegaly or change in aortic tortuosity. No pneumothorax, edema, effusion, or definitive pneumonia.  These results were called by telephone at the time of interpretation on 09/24/2014 at 3:14 am to [place] , who verbally acknowledged these results.  IMPRESSION: 1. Right IJ central line with tip crossing midline and projecting at the left brachiocephalic vein. No pneumothorax. 2. Hypoventilation.   Electronically Signed   By: Monte Fantasia M.D.   On: 09/23/2014 03:15   Ct Head Wo Contrast  09/16/2014   CLINICAL DATA:  Altered mental status after fall from couch. Posterior neck pain. Initial encounter.  EXAM: CT HEAD WITHOUT CONTRAST  CT CERVICAL SPINE WITHOUT CONTRAST  TECHNIQUE: Multidetector CT imaging of the head and cervical spine was  performed following the standard protocol without intravenous contrast. Multiplanar CT image reconstructions of the cervical spine were also generated.  COMPARISON:  None.  FINDINGS: CT HEAD FINDINGS  Skull and Sinuses:Negative for fracture or destructive process. The mastoids, middle ears, and imaged paranasal sinuses are clear.  Orbits: No acute abnormality.  Brain: No evidence  of acute infarction, hemorrhage, hydrocephalus, or mass lesion/mass effect.  CT CERVICAL SPINE FINDINGS  Thyroid gland has indistinct margins with mild surrounding fat stranding. A sub cm dense nodule is noted on the right. No surrounding adenopathy.  No evidence of acute fracture or traumatic malalignment. No gross cervical canal hematoma. There is degenerative disc disease and endplate spurring, most progressed at C4-5, C5-6, and C6-7. Spurring is most notable at C5-6 with right uncovertebral spurs causing advanced foraminal stenosis. A superimposed right paracentral disc herniation with vacuum phenomenon contributes to the C5-6 right foraminal stenosis.  IMPRESSION: 1. No evidence of intracranial or cervical spine injury. 2. Mid and lower cervical degenerative disc disease with foraminal stenoses, most advanced on the right at C5-6. 3. Edema noted around the thyroid gland, suspicious for thyroiditis. Correlate with endocrine history and thyroid function tests.   Electronically Signed   By: Monte Fantasia M.D.   On: 09/24/2014 02:31   Ct Cervical Spine Wo Contrast  09/17/2014   CLINICAL DATA:  Altered mental status after fall from couch. Posterior neck pain. Initial encounter.  EXAM: CT HEAD WITHOUT CONTRAST  CT CERVICAL SPINE WITHOUT CONTRAST  TECHNIQUE: Multidetector CT imaging of the head and cervical spine was performed following the standard protocol without intravenous contrast. Multiplanar CT image reconstructions of the cervical spine were also generated.  COMPARISON:  None.  FINDINGS: CT HEAD FINDINGS  Skull and Sinuses:Negative for fracture or destructive process. The mastoids, middle ears, and imaged paranasal sinuses are clear.  Orbits: No acute abnormality.  Brain: No evidence of acute infarction, hemorrhage, hydrocephalus, or mass lesion/mass effect.  CT CERVICAL SPINE FINDINGS  Thyroid gland has indistinct margins with mild surrounding fat stranding. A sub cm dense nodule is noted on the right.  No surrounding adenopathy.  No evidence of acute fracture or traumatic malalignment. No gross cervical canal hematoma. There is degenerative disc disease and endplate spurring, most progressed at C4-5, C5-6, and C6-7. Spurring is most notable at C5-6 with right uncovertebral spurs causing advanced foraminal stenosis. A superimposed right paracentral disc herniation with vacuum phenomenon contributes to the C5-6 right foraminal stenosis.  IMPRESSION: 1. No evidence of intracranial or cervical spine injury. 2. Mid and lower cervical degenerative disc disease with foraminal stenoses, most advanced on the right at C5-6. 3. Edema noted around the thyroid gland, suspicious for thyroiditis. Correlate with endocrine history and thyroid function tests.   Electronically Signed   By: Monte Fantasia M.D.   On: 09/16/2014 02:31   Dg Chest Portable 1 View  09/07/2014   CLINICAL DATA:  Central line adjustment  EXAM: PORTABLE CHEST - 1 VIEW  COMPARISON:  Same day at 2:43 a.m.  FINDINGS: Shortening of the right IJ central line, but tip still terminates left of midline near the brachiocephalic vein.  Hypoventilation. Stable heart size and mediastinal contours, size accentuated by technique. There is interstitial crowding without edema, effusion, pneumothorax, or overt pneumonia.  IMPRESSION: 1. Despite right central line shortening the tip still terminates near the left brachiocephalic vein. 2. Hypoventilation.   Electronically Signed   By: Monte Fantasia M.D.   On: 10/02/2014 04:28     ASSESSMENT AND  PLAN:   64 year old African American female history of nephrotic syndrome, amyloidosis, essential hypertension presenting with altered mental status/confusion after apparently falling out of bed. Admitted for septic shock  * Septic shock with UTI and right flank abdominal wall cellulitis with GPC chains bacteremia On abx.  Worsening with 3 pressors now. Consult ID for further input.  * Acute renal failure over CKD3 due  to ATN. From septic shock. On CRRT. Appreciate nephrology help. Severe hypoalbuminemia  * Acute encephalopathy - Due to acute illness. CT head nothing acute.  * Anemia of chronic disease- stable  * Type 2 diabetes, uncomplicated: Hold oral agents, at insulin slide scale every 6 hours Accu-Chek  * Essential hypertension: Hold all agents  * Venous thromboembolism prophylactic: Heparin subcutaneous   All the records are reviewed and case discussed with Care Management/Social Workerr. Management plans discussed with the patient, family and they are in agreement.  CODE STATUS: FULL CODE Discussed with daughter-in-law at length. Explained poor prognosis and no good response to aggressive treatment. Discussed regarding consulting palliative care.  DVT Prophylaxis: Heparin  TOTAL CRITICAL CARE TIME TAKING CARE OF THIS PATIENT: 40 minutes.   POSSIBLE D/C IN 3-4 DAYS, DEPENDING ON CLINICAL CONDITION.   Hillary Bow R M.D on 09/29/2014 at 9:41 AM  Between 7am to 6pm - Pager - [phone]  After 6pm go to [email or web address] - password [email or web address] [facility]  [facility]  Office  [phone]  CC: Primary care physician; [name], MD

## 2014-09-29 NOTE — Consult Note (Signed)
WOC wound consult note Reason for Consult:Cellulitis to right lateral thigh and groin, present on admission. edema in both lower extremities, extending from above knees to feet.  *tmporary dialysis catheter catheter right femoral approach in place.  Wound type: Cellulitis to right lateral thigh, groin and under abdominal pannus. Currently on Vancomycin. Pressure Ulcer POA: N/A Measurement:Large raised blister present on right lateral thigh.  Unclear if it is serum or blood filled.  18 cm x 14 cm (raised + 4.2 cm)  Generalized redness to groin upper thighs and under abdominal pannus. Wound TIR:[id number] Drainage (amount, consistency, odor) Minimal serous weeping.  Periwound:Erythema, tender to touch.  Dressing procedure/placement/frequency:Cleanse skin to groin, abdominal pannus and right lateral thigh gently with NS. Apply Interdry Ag wicking fabric to skin folds. Measure and cut length of InterDry Ag+ to fit in skin folds that have skin breakdown Tuck InterDry  Ag+ fabric into skin folds in a single layer, allow for 2 inches of overhang from skin edges to allow for wicking to occur May remove to bathe; dry area thoroughly and then tuck into affected areas again Do not apply any creams or ointments when using InterDry Ag+ DO NOT THROW AWAY FOR 5 DAYS unless soiled with stool DO NOT [facility] product, this will inactivate the silver in the material  New sheet of Interdry Ag+ should be applied after 5 days of use if patient continues to have skin breakdown   Will not follow at this time.  Please re-consult if needed.  Domenic Moras RN BSN [place] Pager [phone]

## 2014-09-29 NOTE — Progress Notes (Signed)
*  PRELIMINARY RESULTS* Echocardiogram 2D Echocardiogram has been performed.  Laqueta Jean Hege 09/29/2014, 2:31 PM

## 2014-09-29 NOTE — Progress Notes (Signed)
Pt lying in bed lethargic but responds appropriately. SR/ST . sats 98% 2lnc. Pt has c/o pain earlier in shift , morphine effective. Pt cont. On CRRT/CVVHD 2.5/100UF/250BF. Pt has foley with poor UOP . Cont on 3 pressors titrated  for  MAP>65. Further assessment via flowsheet

## 2014-09-29 NOTE — Progress Notes (Signed)
Subjective:   Remains critically ill CRRT was started last evening. UF 100 cc/hr. Almost Anuric   Currently requiring 3 pressors Electrolytes are acceptable   Objective:  Vital signs in last 24 hours:  Temp:  [97.1 F (36.2 C)-97.8 F (36.6 C)] 97.5 F (36.4 C) (05/26 0700) Pulse Rate:  [100-109] 101 (05/25 2100) Resp:  [14-25] 18 (05/26 0900) BP: (56-95)/(35-73) 72/57 mmHg (05/26 0900) SpO2:  [91 %-97 %] 96 % (05/26 0700) Weight:  [111.449 kg (245 lb 11.2 oz)] 111.449 kg (245 lb 11.2 oz) (05/26 0500)  Weight change: 16.193 kg (35 lb 11.2 oz) Filed Weights   09/17/2014 0112 10/02/2014 0500 09/29/14 0500  Weight: 95.255 kg (210 lb) 107 kg (235 lb 14.3 oz) 111.449 kg (245 lb 11.2 oz)    Intake/Output: I/O last 3 completed shifts: In: 7017 [I.V.:3301; IV Piggyback:350] Out: 7939 [Urine:210; Other:1238]     Physical Exam: General: NAD, laying in bed  HEENT Eyes closed, moist mucus membraned  Neck supple  Pulm/lungs Decreased breath sounds at bases, normal resp effort  CVS/Heart tachycardic  Abdomen:  Soft, non tender  Extremities: +++ anasarca  Neurologic: Resting quietly, follows simple commands  Skin: + edema  Access: Temp cath- rt femoral       Basic Metabolic Panel:  Recent Labs Lab 09/08/2014 0045 09/06/2014 1403 10/01/2014 1851 09/29/14 0409  NA 133* 134* 134* 133*  K 3.5 2.8* 3.4* 4.0  CL 90* 97* 98* 101  CO2 28 21* 23 24  GLUCOSE 74 86 90 82  BUN 49* 47* 41* 31*  CREATININE 2.36* 2.31* 2.01* 1.53*  CALCIUM 8.2* 7.1* 7.2* 7.6*  MG  --   --   --  1.4*  PHOS  --   --  3.8 3.7     CBC:  Recent Labs Lab 09/30/2014 0045 09/29/14 0437  WBC 5.3 21.4*  HGB 11.5* 10.9*  HCT 35.5 33.5*  MCV 88.0 89.1  PLT 311 204      Microbiology: Results for orders placed or performed during the hospital encounter of 09/27/2014  Blood culture (routine x 2)     Status: None (Preliminary result)   Collection Time: 09/09/2014 12:45 AM  Result Value Ref Range Status    Specimen Description BLOOD  Final   Special Requests NONE  Final   Culture  Setup Time   Final    GRAM POSITIVE COCCI IN CHAINS IN BOTH AEROBIC AND ANAEROBIC BOTTLES CRITICAL RESULT CALLED TO, READ BACK BY AND VERIFIED WITH: PAM CRAWFORD AT 1406 09/27/2014 DV    Culture GRAM POSITIVE COCCI IDENTIFICATION TO FOLLOW   Final   Report Status PENDING  Incomplete  Blood culture (routine x 2)     Status: None (Preliminary result)   Collection Time: 09/12/2014 12:45 AM  Result Value Ref Range Status   Specimen Description BLOOD  Final   Special Requests NONE  Final   Culture  Setup Time   Final    GRAM POSITIVE COCCI IN CHAINS IN BOTH AEROBIC AND ANAEROBIC BOTTLES CRITICAL RESULT CALLED TO, READ BACK BY AND VERIFIED WITH: PAM CRAWFORD AT 1406 09/15/2014 DV    Culture GRAM POSITIVE COCCI IDENTIFICATION TO FOLLOW   Final   Report Status PENDING  Incomplete    Coagulation Studies:  Recent Labs  10/04/2014 0045  LABPROT 27.8*  INR 2.58    Urinalysis:  Recent Labs  10/01/2014 0110  COLORURINE AMBER*  LABSPEC 1.016  PHURINE 5.0  GLUCOSEU NEGATIVE  HGBUR 1+*  BILIRUBINUR 2+*  KETONESUR NEGATIVE  PROTEINUR 100*  NITRITE NEGATIVE  LEUKOCYTESUR NEGATIVE      Imaging: Dg Chest 1 View  09/10/2014   CLINICAL DATA:  Central line placement.  EXAM: CHEST  1 VIEW  COMPARISON:  06/26/2014  FINDINGS: Right IJ central line with tip crossing midline and terminating in the region of the left brachiocephalic vein.  Hypoventilation. When accounting for this there is no cardiomegaly or change in aortic tortuosity. No pneumothorax, edema, effusion, or definitive pneumonia.  These results were called by telephone at the time of interpretation on 09/27/2014 at 3:14 am to [place] , who verbally acknowledged these results.  IMPRESSION: 1. Right IJ central line with tip crossing midline and projecting at the left brachiocephalic vein. No pneumothorax. 2. Hypoventilation.   Electronically Signed   By: Monte Fantasia M.D.   On: 09/10/2014 03:15   Ct Head Wo Contrast  09/08/2014   CLINICAL DATA:  Altered mental status after fall from couch. Posterior neck pain. Initial encounter.  EXAM: CT HEAD WITHOUT CONTRAST  CT CERVICAL SPINE WITHOUT CONTRAST  TECHNIQUE: Multidetector CT imaging of the head and cervical spine was performed following the standard protocol without intravenous contrast. Multiplanar CT image reconstructions of the cervical spine were also generated.  COMPARISON:  None.  FINDINGS: CT HEAD FINDINGS  Skull and Sinuses:Negative for fracture or destructive process. The mastoids, middle ears, and imaged paranasal sinuses are clear.  Orbits: No acute abnormality.  Brain: No evidence of acute infarction, hemorrhage, hydrocephalus, or mass lesion/mass effect.  CT CERVICAL SPINE FINDINGS  Thyroid gland has indistinct margins with mild surrounding fat stranding. A sub cm dense nodule is noted on the right. No surrounding adenopathy.  No evidence of acute fracture or traumatic malalignment. No gross cervical canal hematoma. There is degenerative disc disease and endplate spurring, most progressed at C4-5, C5-6, and C6-7. Spurring is most notable at C5-6 with right uncovertebral spurs causing advanced foraminal stenosis. A superimposed right paracentral disc herniation with vacuum phenomenon contributes to the C5-6 right foraminal stenosis.  IMPRESSION: 1. No evidence of intracranial or cervical spine injury. 2. Mid and lower cervical degenerative disc disease with foraminal stenoses, most advanced on the right at C5-6. 3. Edema noted around the thyroid gland, suspicious for thyroiditis. Correlate with endocrine history and thyroid function tests.   Electronically Signed   By: Monte Fantasia M.D.   On: 09/10/2014 02:31   Ct Cervical Spine Wo Contrast  09/09/2014   CLINICAL DATA:  Altered mental status after fall from couch. Posterior neck pain. Initial encounter.  EXAM: CT HEAD WITHOUT CONTRAST  CT CERVICAL SPINE  WITHOUT CONTRAST  TECHNIQUE: Multidetector CT imaging of the head and cervical spine was performed following the standard protocol without intravenous contrast. Multiplanar CT image reconstructions of the cervical spine were also generated.  COMPARISON:  None.  FINDINGS: CT HEAD FINDINGS  Skull and Sinuses:Negative for fracture or destructive process. The mastoids, middle ears, and imaged paranasal sinuses are clear.  Orbits: No acute abnormality.  Brain: No evidence of acute infarction, hemorrhage, hydrocephalus, or mass lesion/mass effect.  CT CERVICAL SPINE FINDINGS  Thyroid gland has indistinct margins with mild surrounding fat stranding. A sub cm dense nodule is noted on the right. No surrounding adenopathy.  No evidence of acute fracture or traumatic malalignment. No gross cervical canal hematoma. There is degenerative disc disease and endplate spurring, most progressed at C4-5, C5-6, and C6-7. Spurring is most notable at C5-6 with right uncovertebral spurs causing advanced foraminal  stenosis. A superimposed right paracentral disc herniation with vacuum phenomenon contributes to the C5-6 right foraminal stenosis.  IMPRESSION: 1. No evidence of intracranial or cervical spine injury. 2. Mid and lower cervical degenerative disc disease with foraminal stenoses, most advanced on the right at C5-6. 3. Edema noted around the thyroid gland, suspicious for thyroiditis. Correlate with endocrine history and thyroid function tests.   Electronically Signed   By: Monte Fantasia M.D.   On: 10/02/2014 02:31   Dg Chest Portable 1 View  10/02/2014   CLINICAL DATA:  Central line adjustment  EXAM: PORTABLE CHEST - 1 VIEW  COMPARISON:  Same day at 2:43 a.m.  FINDINGS: Shortening of the right IJ central line, but tip still terminates left of midline near the brachiocephalic vein.  Hypoventilation. Stable heart size and mediastinal contours, size accentuated by technique. There is interstitial crowding without edema, effusion,  pneumothorax, or overt pneumonia.  IMPRESSION: 1. Despite right central line shortening the tip still terminates near the left brachiocephalic vein. 2. Hypoventilation.   Electronically Signed   By: Monte Fantasia M.D.   On: 09/07/2014 04:28     Medications:   . sodium chloride 1,000 mL (09/22/2014 0258)  . sodium chloride 125 mL/hr at 09/07/2014 0500  . sodium chloride Stopped (09/23/2014 0549)  . norepinephrine (LEVOPHED) Adult infusion 40 mcg/min (09/29/14 0724)  . phenylephrine (NEO-SYNEPHRINE) Adult infusion 60 mcg/min (09/29/14 2841)  . vasopressin (PITRESSIN) infusion - *FOR SHOCK* 0.03 Units/min (09/29/14 3244)   . antiseptic oral rinse  7 mL Mouth Rinse q12n4p  . chlorhexidine  15 mL Mouth Rinse BID  . feeding supplement (RESOURCE BREEZE)  1 Container Oral BID BM  . heparin  5,000 Units Subcutaneous 3 times per day  . magnesium sulfate 1 - 4 g bolus IVPB  2 g Intravenous Once  . midodrine  10 mg Oral TID  . piperacillin-tazobactam (ZOSYN)  IV  3.375 g Intravenous 3 times per day  . vancomycin  1,000 mg Intravenous Q24H   acetaminophen **OR** acetaminophen, heparin, morphine injection, ondansetron **OR** ondansetron (ZOFRAN) IV, polyethylene glycol powder  Assessment/ Plan:   64 y.o. African [place] female with diabetes mellitus type 2, hypertension, morbid obesity, osteoarthritis, status post right knee surgery, nephrotic syndrome with edema, hypoalbuminemia, renal insufficiency and proteinuria.  1. ARF with CKD st 3 . Baseline cr 1.3. (08/2014)  2. Nephrotic Syndrome with proteinuria: Renal Amyloidosis.  3. Generalized Edema 4. Hypoalbuminemia 5. Right groin cellulitis /sepsis  6. Diabetes Mellitus type II 7. Hypotension  Very difficult situation. Hypotension and edema are due to 3rd spacing from underlying cause of amyloidosis Until definitive treatment can be done, will provide oncotic support with iv albumin recognizing that it is a temporary measure.   CRRT started on  5/25. UF rate 100 cc/hr. Net i/o +1800 Dose meds for Cr Cl < 10 Poor prognosis - discussed with family- daughter in law   LOS: 1 Dystany Duffy 5/26/20169:20 AM

## 2014-09-29 NOTE — Consult Note (Signed)
Palliative Medicine Inpatient Consult Note   Name: [name] Date: 09/29/2014 MRN: [id number]  DOB: [birth year]  Referring Physician: Hillary Bow, MD  Palliative Care consult requested for this 64 y.o. female for goals of medical therapy in patient with amyloidosis (markers - for multiple myeloma), CKD stage III, T2DM, HTN, h/o uterine fibroids, and s/p total abdominal hysterectomy.  Pt presented to ER from home with AMS.  ER workup significant for lactic acidosis, severe hypoalbuminemia, and ARF. Pt admitted to CCU for treatment and evaluation of sepsis.  Family at bedside.  Pt resting in bed on CRRT and three pressors.  Pt is lethargic and states she feels weak.     REVIEW OF SYSTEMS:  Pain: None Dyspnea:  No Nausea/Vomiting:  No Diarrhea:  No Fatigue:   Yes All other systems were reviewed and found to be negative  SPIRITUAL SUPPORT SYSTEM: Yes.  SOCIAL HISTORY:  reports that she has never smoked. She has never used smokeless tobacco. She reports that she does not drink alcohol or use illicit drugs.  LEGAL DOCUMENTS:  None  CODE STATUS: Full code  PAST MEDICAL HISTORY: Past Medical History  Diagnosis Date  . HTN (hypertension)   . Diabetes mellitus type II   . Obesity   . Arthritis   . Chronic kidney disease   . Amyloidosis     PAST SURGICAL HISTORY:  Past Surgical History  Procedure Laterality Date  . Total abdominal hysterectomy  10/1999    fibroids  . Combined abdominoplasty and liposuction  07/2000  . Total knee arthroplasty Right 09/28/2013    Procedure: RIGHT TOTAL KNEE ARTHROPLASTY;  Surgeon: Mauri Pole, MD;  Location: [facility];  Service: Orthopedics;  Laterality: Right;    ALLERGIES:  has No Known Allergies.  MEDICATIONS:  Current Facility-Administered Medications  Medication Dose Route Frequency Provider Last Rate Last Dose  . 0.9 %  sodium chloride infusion  1,000 mL Intravenous Continuous Gregor Hams, MD 999 mL/hr at 09/12/2014 0258  1,000 mL at 09/17/2014 0258  . 0.9 %  sodium chloride infusion   Intravenous Continuous Lytle Butte, MD 125 mL/hr at 09/23/2014 0500    . 0.9 %  sodium chloride infusion   Intravenous Continuous Lytle Butte, MD   Stopped at 09/19/2014 [phone]  . acetaminophen (TYLENOL) tablet 650 mg  650 mg Oral Q6H PRN Lytle Butte, MD       Or  . acetaminophen (TYLENOL) suppository 650 mg  650 mg Rectal Q6H PRN Lytle Butte, MD      . antiseptic oral rinse (CPC / CETYLPYRIDINIUM CHLORIDE 0.05%) solution 7 mL  7 mL Mouth Rinse q12n4p Harrie Foreman, MD   7 mL at 09/09/2014 1221  . chlorhexidine (PERIDEX) 0.12 % solution 15 mL  15 mL Mouth Rinse BID Harrie Foreman, MD   15 mL at 09/20/2014 1959  . feeding supplement (RESOURCE BREEZE) (RESOURCE BREEZE) liquid 1 Container  1 Container Oral BID BM Srikar Sudini, MD   1 Container at 09/14/2014 1553  . heparin injection 1,000-6,000 Units  1,000-6,000 Units CRRT PRN Murlean Iba, MD      . heparin injection 5,000 Units  5,000 Units Subcutaneous 3 times per day Lytle Butte, MD   5,000 Units at 09/29/14 0510  . magnesium sulfate IVPB 2 g 50 mL  2 g Intravenous Once Flora Lipps, MD   2 g at 09/29/14 0920  . midodrine (PROAMATINE) tablet 10 mg  10 mg Oral TID Shanon Brow  Woodfin Ganja, MD   10 mg at 09/29/14 0920  . morphine 2 MG/ML injection 2 mg  2 mg Intravenous Q4H PRN Lytle Butte, MD   2 mg at 10/03/2014 1944  . norepinephrine (LEVOPHED) 16 mg in dextrose 5 % 250 mL (0.064 mg/mL) infusion  0-40 mcg/min Intravenous Titrated Srikar Sudini, MD 37.5 mL/hr at 09/29/14 0724 40 mcg/min at 09/29/14 0724  . ondansetron (ZOFRAN) tablet 4 mg  4 mg Oral Q6H PRN Lytle Butte, MD       Or  . ondansetron [facility]) injection 4 mg  4 mg Intravenous Q6H PRN Lytle Butte, MD   4 mg at 09/22/2014 1349  . phenylephrine (NEO-SYNEPHRINE) 10 mg in dextrose 5 % 250 mL (0.04 mg/mL) infusion  0-400 mcg/min Intravenous Titrated Flora Lipps, MD 90 mL/hr at 09/29/14 0920 60 mcg/min at 09/29/14 0920  .  piperacillin-tazobactam (ZOSYN) IVPB 3.375 g  3.375 g Intravenous 3 times per day Lytle Butte, MD   3.375 g at 09/29/14 0510  . polyethylene glycol powder (GLYCOLAX/MIRALAX) container 17 g  17 g Oral Daily PRN Lytle Butte, MD      . vancomycin (VANCOCIN) IVPB 1000 mg/200 mL premix  1,000 mg Intravenous Q24H Hillary Bow, MD   1,000 mg at 10/02/2014 1635  . vasopressin (PITRESSIN) 40 Units in sodium chloride 0.9 % 250 mL (0.16 Units/mL) infusion  0.03 Units/min Intravenous Continuous Lytle Butte, MD 11.3 mL/hr at 09/29/14 0611 0.03 Units/min at 09/29/14 0611    Vital Signs: BP 72/57 mmHg  Pulse 101  Temp(Src) 97.5 F (36.4 C) (Oral)  Resp 18  Ht $R'5\' 1"'bv$  (1.549 m)  Wt 111.449 kg (245 lb 11.2 oz)  BMI 46.45 kg/m2  SpO2 96% Filed Weights   09/22/2014 0112 09/23/2014 0500 09/29/14 0500  Weight: 95.255 kg (210 lb) 107 kg (235 lb 14.3 oz) 111.449 kg (245 lb 11.2 oz)    Estimated body mass index is 46.45 kg/(m^2) as calculated from the following:   Height as of this encounter: $RemoveBeforeD'5\' 1"'[id number]$  (1.549 m).   Weight as of this encounter: 111.449 kg (245 lb 11.2 oz).  PERFORMANCE STATUS (ECOG) : 2 - Symptomatic, <50% confined to bed  PHYSICAL EXAM: [place]: Critically ill appearing HEENT: OP clear, ETT present Neck: Trachea midline  Cardiovascular: regular rate and rhythm Pulmonary/Chest: good air movemnt ant fields, no audible wheeze Abdominal: Soft. Hypoactive bowel sounds GU: Foley present, dark urine Extremities: 3 + pitting edema BLE's and BUE's Neurological: Opens eyes to voice, moves extremities Skin: Warm, dry and intact. R flank area with edema, no redness noted Psychiatric: Unable to assess   LABS: CBC:    Component Value Date/Time   WBC 21.4* 09/29/2014 0437   WBC 9.2 08/22/2014 1712   HGB 10.9* 09/29/2014 0437   HGB 12.3 08/22/2014 1712   HCT 33.5* 09/29/2014 0437   HCT 38.3 08/22/2014 1712   PLT 204 09/29/2014 0437   PLT 503* 08/22/2014 1712   MCV 89.1 09/29/2014 0437   MCV  87 08/22/2014 1712   NEUTROABS 5.3 09/08/2014 0730   NEUTROABS 4.0 08/22/2014 1712   LYMPHSABS 2.5 09/08/2014 0730   LYMPHSABS 4.2* 08/22/2014 1712   MONOABS 0.8 09/08/2014 0730   MONOABS 0.9 08/22/2014 1712   EOSABS 0.0 09/08/2014 0730   EOSABS 0.0 08/22/2014 1712   BASOSABS 0.0 09/08/2014 0730   BASOSABS 0.1 08/22/2014 1712   Comprehensive Metabolic Panel:    Component Value Date/Time   NA 132* 09/29/2014 0830  NA 136 08/22/2014 1712   K 4.0 09/29/2014 0830   K 2.6* 08/22/2014 1712   CL 99* 09/29/2014 0830   CL 92* 08/22/2014 1712   CO2 23 09/29/2014 0830   CO2 35* 08/22/2014 1712   BUN 28* 09/29/2014 0830   BUN 29* 08/22/2014 1712   CREATININE 1.53* 09/29/2014 0830   CREATININE 1.78* 08/22/2014 1712   GLUCOSE 89 09/29/2014 0830   GLUCOSE 167* 08/22/2014 1712   CALCIUM 7.4* 09/29/2014 0830   CALCIUM 8.2* 08/22/2014 1712   AST 75* 09/27/2014 1403   AST 40 08/22/2014 1712   ALT 19 09/21/2014 1403   ALT 22 08/22/2014 1712   ALKPHOS 656* 09/09/2014 1403   ALKPHOS 294* 08/22/2014 1712   BILITOT 5.8* 09/24/2014 1403   PROT 4.2* 10/04/2014 1403   PROT 5.0* 08/22/2014 1712   ALBUMIN <1.0* 09/29/2014 0830   ALBUMIN 1.7* 08/22/2014 1712    IMPRESSION: Ms. Amy is a 64 y.o. female with amyloidosis (markers - for multiple myeloma), CKD stage III, T2DM, HTN, h/o uterine fibroids, and s/p total abdominal hysterectomy.  Pt presented to ER from home with AMS.  ER workup significant for lactic acidosis, severe hypoalbuminemia, and ARF. Pt admitted to CCU for treatment and evaluation of sepsis.  Family at bedside.  Pt resting in bed on CRRT and three pressors.  Pt is lethargic and states she feels weak.    Pt with leukocytosis and continued severe hypoalbuminemia today.  BC show gram + cocci x2. Pt is on CRRT and 3 pressors.  Pt is resting in bed and defers all questions to daughter in law, [name], at bedside.  Family states pt lives at home alone and due to increasing fatigue  and swelling over last 2-3 weeks pt has become more dependent on grandson, who lives with pt currently, to help.  Family confirms pt was due for chemotherapy yesterday for amyloidosis and would like for pt to continue if she can recover from current acute episode.  Family states they have spoken with pt about intubation and CPR.  Pt has stated to family she would want this and shakes her head yes during my discussion with family.  Family has not spoke about trach or PEG if pt could not be extubated.  Daughtyer-in-law would like pt's son involved in that conversation.  Daughter in-law states if pt recovered from this acute episode, but could not return home that they have ample room for pt at their house.  Will revisit when son is present as he is home sleeping now after spending the night.  Family understands pt is critically ill.  PLAN: 1. Full Code 2. Continue current treatment    More than 50% of the visit was spent in counseling/coordination of care: Yes  Time Spent: 63minutes

## 2014-09-30 DIAGNOSIS — E1122 Type 2 diabetes mellitus with diabetic chronic kidney disease: Secondary | ICD-10-CM

## 2014-09-30 DIAGNOSIS — A419 Sepsis, unspecified organism: Secondary | ICD-10-CM

## 2014-09-30 LAB — RENAL FUNCTION PANEL
Albumin: <1 g/dL — ABNORMAL LOW (ref 3.5–5.0)
Albumin: 1.1 g/dL — ABNORMAL LOW (ref 3.5–5.0)
Anion gap: 7 (ref 5–15)
Anion gap: 7 (ref 5–15)
Anion gap: 9 (ref 5–15)
BUN: 16 mg/dL (ref 6–20)
BUN: 17 mg/dL (ref 6–20)
BUN: 20 mg/dL (ref 6–20)
CALCIUM: 7.7 mg/dL — AB (ref 8.9–10.3)
CHLORIDE: 99 mmol/L — AB (ref 101–111)
CO2: 24 mmol/L (ref 22–32)
CO2: 25 mmol/L (ref 22–32)
CO2: 25 mmol/L (ref 22–32)
CREATININE: 1.01 mg/dL — AB (ref 0.44–1.00)
Calcium: 7.8 mg/dL — ABNORMAL LOW (ref 8.9–10.3)
Calcium: 7.9 mg/dL — ABNORMAL LOW (ref 8.9–10.3)
Chloride: 100 mmol/L — ABNORMAL LOW (ref 101–111)
Chloride: 101 mmol/L (ref 101–111)
Creatinine, Ser: 1.11 mg/dL — ABNORMAL HIGH (ref 0.44–1.00)
Creatinine, Ser: 1.19 mg/dL — ABNORMAL HIGH (ref 0.44–1.00)
GFR calc Af Amer: 54 mL/min — ABNORMAL LOW (ref >60)
GFR calc Af Amer: 59 mL/min — ABNORMAL LOW (ref >60)
GFR calc non Af Amer: 57 mL/min — ABNORMAL LOW (ref >60)
GFR, EST NON AFRICAN AMERICAN: 47 mL/min — AB (ref >60)
GFR, EST NON AFRICAN AMERICAN: 51 mL/min — AB (ref >60)
Glucose, Bld: 110 mg/dL — ABNORMAL HIGH (ref 65–99)
Glucose, Bld: 111 mg/dL — ABNORMAL HIGH (ref 65–99)
Glucose, Bld: 112 mg/dL — ABNORMAL HIGH (ref 65–99)
PHOSPHORUS: 2.6 mg/dL (ref 2.5–4.6)
POTASSIUM: 4.3 mmol/L (ref 3.5–5.1)
POTASSIUM: 4.3 mmol/L (ref 3.5–5.1)
Phosphorus: 2.1 mg/dL — ABNORMAL LOW (ref 2.5–4.6)
Phosphorus: 1.9 mg/dL — ABNORMAL LOW (ref 2.5–4.6)
Potassium: 4.2 mmol/L (ref 3.5–5.1)
SODIUM: 131 mmol/L — AB (ref 135–145)
SODIUM: 133 mmol/L — AB (ref 135–145)
Sodium: 133 mmol/L — ABNORMAL LOW (ref 135–145)

## 2014-09-30 LAB — CULTURE, BLOOD (ROUTINE X 2)

## 2014-09-30 LAB — BLOOD GAS, VENOUS
Acid-Base Excess: 0 mmol/L (ref 0.0–3.0)
BICARBONATE: 24.8 meq/L (ref 21.0–28.0)
FIO2: 0.28 %
Patient temperature: 37
pCO2, Ven: 40 mmHg — ABNORMAL LOW (ref 44.0–60.0)
pH, Ven: 7.4 (ref 7.320–7.430)

## 2014-09-30 LAB — CBC
HCT: 31.3 % — ABNORMAL LOW (ref 35.0–47.0)
Hemoglobin: 10 g/dL — ABNORMAL LOW (ref 12.0–16.0)
MCH: 28.3 pg (ref 26.0–34.0)
MCHC: 31.9 g/dL — ABNORMAL LOW (ref 32.0–36.0)
MCV: 88.9 fL (ref 80.0–100.0)
Platelets: 149 10*3/uL — ABNORMAL LOW (ref 150–440)
RBC: 3.53 MIL/uL — ABNORMAL LOW (ref 3.80–5.20)
RDW: 20.4 % — ABNORMAL HIGH (ref 11.5–14.5)
WBC: 32.7 10*3/uL — ABNORMAL HIGH (ref 3.6–11.0)

## 2014-09-30 LAB — MAGNESIUM
MAGNESIUM: 1.6 mg/dL — AB (ref 1.7–2.4)
Magnesium: 1.8 mg/dL (ref 1.7–2.4)

## 2014-09-30 LAB — APTT: APTT: 57 s — AB (ref 24–36)

## 2014-09-30 LAB — VANCOMYCIN, TROUGH: Vancomycin Tr: 10 ug/mL (ref 10–20)

## 2014-09-30 MED ORDER — MAGNESIUM SULFATE 2 GM/50ML IV SOLN
2.0000 g | Freq: Once | INTRAVENOUS | Status: AC
  Administered 2014-09-30: 2 g via INTRAVENOUS
  Filled 2014-09-30: qty 50

## 2014-09-30 MED ORDER — ALBUMIN HUMAN 25 % IV SOLN
12.5000 g | Freq: Four times a day (QID) | INTRAVENOUS | Status: DC
  Administered 2014-09-30 – 2014-10-01 (×4): 12.5 g via INTRAVENOUS
  Filled 2014-09-30 (×9): qty 50

## 2014-09-30 MED ORDER — CLINDAMYCIN PHOSPHATE 600 MG/50ML IV SOLN
600.0000 mg | Freq: Three times a day (TID) | INTRAVENOUS | Status: DC
  Administered 2014-09-30 – 2014-10-10 (×31): 600 mg via INTRAVENOUS
  Filled 2014-09-30 (×35): qty 50

## 2014-09-30 MED ORDER — CEFTRIAXONE SODIUM IN DEXTROSE 40 MG/ML IV SOLN
2.0000 g | INTRAVENOUS | Status: DC
  Administered 2014-10-01 – 2014-10-04 (×4): 2 g via INTRAVENOUS
  Filled 2014-09-30 (×6): qty 50

## 2014-09-30 MED ORDER — PUREFLOW DIALYSIS SOLUTION
INTRAVENOUS | Status: DC
  Administered 2014-10-01 – 2014-10-02 (×3): via INTRAVENOUS_CENTRAL

## 2014-09-30 MED ORDER — PHENYLEPHRINE HCL 10 MG/ML IJ SOLN
0.0000 ug/min | INTRAMUSCULAR | Status: DC
  Administered 2014-09-30: 37 ug/min via INTRAVENOUS
  Administered 2014-10-01: 12 ug/min via INTRAVENOUS
  Administered 2014-10-01: 25 ug/min via INTRAVENOUS
  Administered 2014-10-01: 21 ug/min via INTRAVENOUS
  Filled 2014-09-30 (×3): qty 4

## 2014-09-30 MED ORDER — CEFTRIAXONE SODIUM IN DEXTROSE 20 MG/ML IV SOLN
1.0000 g | INTRAVENOUS | Status: DC
  Administered 2014-09-30: 1 g via INTRAVENOUS
  Filled 2014-09-30: qty 50

## 2014-09-30 MED ORDER — CEFTRIAXONE SODIUM IN DEXTROSE 20 MG/ML IV SOLN
1.0000 g | INTRAVENOUS | Status: DC
  Administered 2014-09-30: 1 g via INTRAVENOUS
  Filled 2014-09-30 (×2): qty 50

## 2014-09-30 NOTE — Progress Notes (Signed)
[facility] NOTE  Pharmacy Consult for Antibiotic Dosing, Electrolyte Management  Indication: Septic Shock, ICU Status   No Known Allergies  Patient Measurements: Height: 5\' 1"  (154.9 cm) Weight: 234 lb 9.6 oz (106.414 kg) IBW/kg (Calculated) : 47.8   Vital Signs: Temp: 98.3 F (36.8 C) (05/27 1934) Temp Source: Oral (05/27 1934) BP: 88/70 mmHg (05/27 2200) Pulse Rate: 106 (05/27 2200) Intake/Output from previous day: 05/26 0701 - 05/27 0700 In: 3434.2 [I.V.:3234.2; IV Piggyback:200] Out: 2353 [Urine:30] Intake/Output from this shift: Total I/O In: 332.8 [I.V.:232.8; IV Piggyback:100] Out: 194 [Other:194] Vent settings for last 24 hours:    Labs:  Recent Labs  09/27/2014 0045 09/30/2014 1403  09/29/14 0409 09/29/14 0437  09/29/14 2356 09/30/14 0436 09/30/14 0836 09/30/14 1345  WBC 5.3  --   --   --  21.4*  --   --   --  32.7*  --   HGB 11.5*  --   --   --  10.9*  --   --   --  10.0*  --   HCT 35.5  --   --   --  33.5*  --   --   --  31.3*  --   PLT 311  --   --   --  204  --   --   --  149*  --   APTT  --   --   --   --  54*  --   --  57*  --   --   INR 2.58  --   --   --   --   --   --   --   --   --   CREATININE 2.36* 2.31*  < > 1.53*  --   < > 1.19*  --  1.11* 1.01*  MG  --   --   --  1.4*  --   --   --  1.6*  --  1.8  PHOS  --   --   < > 3.7  --   < > 2.6  --  2.1* 1.9*  ALBUMIN <1.0* 1.0*  < >  --   --   < > <1.0*  --  <1.0* 1.1*  PROT 4.7* 4.2*  --   --   --   --   --   --   --   --   AST 70* 75*  --   --   --   --   --   --   --   --   ALT 23 19  --   --   --   --   --   --   --   --   ALKPHOS 928* 656*  --   --   --   --   --   --   --   --   BILITOT 4.6* 5.8*  --   --   --   --   --   --   --   --   < > = values in this interval not displayed. Estimated Creatinine Clearance: 62.4 mL/min (by C-G formula based on Cr of 1.01).   Recent Labs  10/01/2014 0442 10/03/2014 0537  GLUCAP 48* 80    Microbiology: Recent Results (from  the past 720 hour(s))  Blood culture (routine x 2)     Status: None (Preliminary result)   Collection Time: 10/02/2014 12:45 AM  Result Value Ref  Range Status   Specimen Description BLOOD  Final   Special Requests NONE  Final   Culture  Setup Time   Final    GRAM POSITIVE COCCI IN CHAINS IN BOTH AEROBIC AND ANAEROBIC BOTTLES CRITICAL RESULT CALLED TO, READ BACK BY AND VERIFIED WITH: PAM CRAWFORD AT 1406 09/24/2014 DV    Culture   Final    STREPTOCOCCUS SPECIES SUSCEPTIBILITIES TO FOLLOW    Report Status PENDING  Incomplete  Blood culture (routine x 2)     Status: None   Collection Time: 09/25/2014 12:45 AM  Result Value Ref Range Status   Specimen Description BLOOD  Final   Special Requests NONE  Final   Culture  Setup Time   Final    GRAM POSITIVE COCCI IN CHAINS IN BOTH AEROBIC AND ANAEROBIC BOTTLES CRITICAL RESULT CALLED TO, READ BACK BY AND VERIFIED WITH: PAM CRAWFORD AT 1406 09/16/2014 DV    Culture   Final    STREPTOCOCCUS SPECIES REFER TO ACCESSION [id number] COLLECTED ON 09/20/2014 AT 0045 FOR SENSITIVITIES    Report Status 09/30/2014 FINAL  Final    Medications:  Scheduled:  . albumin human  12.5 g Intravenous Q6H  . antiseptic oral rinse  7 mL Mouth Rinse q12n4p  . cefTRIAXone (ROCEPHIN)  IV  1 g Intravenous Q24H  . [START ON 10/01/2014] cefTRIAXone (ROCEPHIN)  IV  2 g Intravenous Q24H  . chlorhexidine  15 mL Mouth Rinse BID  . clindamycin (CLEOCIN) IV  600 mg Intravenous 3 times per day  . feeding supplement (RESOURCE BREEZE)  1 Container Oral BID BM  . heparin  5,000 Units Subcutaneous 3 times per day  . hydrocortisone sod succinate (SOLU-CORTEF) inj  50 mg Intravenous Q6H  . midodrine  10 mg Oral TID   Infusions:  . norepinephrine (LEVOPHED) Adult infusion 40 mcg/min (09/30/14 2300)  . phenylephrine (NEO-SYNEPHRINE) Adult infusion 25.067 mcg/min (09/30/14 2300)  . pureflow 1,000 mL/hr at 09/30/14 1403  . vasopressin (PITRESSIN) infusion - *FOR SHOCK* 0.03 Units/min  (09/30/14 2300)    Assessment: 64 yo female admitted to ICU for septic shock requiring norepinephrine and vasopressin. Patient on day 1 of Vancomycin and Zosyn.   Plan:   1. Antibiotics: Patient transitioned off vancomycin to ceftriaxone 2g IV Q24hr and Clindamycin 600mg  IV Q8hr. Will continue to monitor cultures, WBC and temperatures. Patient is receiving stress dose steroids which is likely contributing to patient's leukocytosis.     2. Electrolytes: Magnesium 2g IV x 1 ordered. All other electrolytes WNL. Will recheck with am labs.   Pharmacy will continue to monitor and adjust per consult.    Simpson,Michael L 09/30/2014,11:16 PM

## 2014-09-30 NOTE — Progress Notes (Signed)
Subjective:   Remains critically ill CRRT was started on 5/25. UF 100 cc/hr. Almost Anuric   Currently requiring 3 pressors Electrolytes are acceptable Neo to be quad concentrated Rt leg cellulitis appears to be worsening  Objective:  Vital signs in last 24 hours:  Temp:  [98 F (36.7 C)-98.8 F (37.1 C)] 98.1 F (36.7 C) (05/27 0736) Pulse Rate:  [68] 68 (05/26 1900) Resp:  [14-30] 18 (05/27 0900) BP: (74-182)/(37-148) 77/61 mmHg (05/27 0900) SpO2:  [94 %-100 %] 99 % (05/27 0900) Weight:  [106.414 kg (234 lb 9.6 oz)] 106.414 kg (234 lb 9.6 oz) (05/27 0600)  Weight change: -5.035 kg (-11 lb 1.6 oz) Filed Weights   10/03/2014 0500 09/29/14 0500 09/30/14 0600  Weight: 107 kg (235 lb 14.3 oz) 111.449 kg (245 lb 11.2 oz) 106.414 kg (234 lb 9.6 oz)    Intake/Output: I/O last 3 completed shifts: In: 4355.6 [I.V.:4105.6; IV Piggyback:250] Out: 7824 [Urine:155; Other:3468]     Physical Exam: General: NAD, laying in bed  HEENT Eyes closed, moist mucus membranes  Neck supple  Pulm/lungs Decreased breath sounds at bases, normal resp effort  CVS/Heart tachycardic  Abdomen:  Soft, non tender  Extremities: +++ anasarca  Neurologic: Resting quietly, follows simple commands  Skin: + edema, rt groin cellulitis appears worse today  Access: Temp cath- rt femoral       Basic Metabolic Panel:  Recent Labs Lab 09/29/14 0409 09/29/14 0830 09/29/14 1522 09/29/14 2356 09/30/14 0436 09/30/14 0836  NA 133* 132* 133* 133*  --  131*  K 4.0 4.0 4.3 4.3  --  4.2  CL 101 99* 98* 99*  --  100*  CO2 24 23 23 25   --  24  GLUCOSE 82 89 104* 110*  --  112*  BUN 31* 28* 24* 20  --  17  CREATININE 1.53* 1.53* 1.34* 1.19*  --  1.11*  CALCIUM 7.6* 7.4* 7.8* 7.8*  --  7.7*  MG 1.4*  --   --   --  1.6*  --   PHOS 3.7 3.5 2.9 2.6  --  2.1*     CBC:  Recent Labs Lab 10/03/2014 0045 09/29/14 0437 09/30/14 0836  WBC 5.3 21.4* 32.7*  HGB 11.5* 10.9* 10.0*  HCT 35.5 33.5* 31.3*  MCV  88.0 89.1 88.9  PLT 311 204 149*      Microbiology: Results for orders placed or performed during the hospital encounter of 09/25/2014  Blood culture (routine x 2)     Status: None (Preliminary result)   Collection Time: 09/30/2014 12:45 AM  Result Value Ref Range Status   Specimen Description BLOOD  Final   Special Requests NONE  Final   Culture  Setup Time   Final    GRAM POSITIVE COCCI IN CHAINS IN BOTH AEROBIC AND ANAEROBIC BOTTLES CRITICAL RESULT CALLED TO, READ BACK BY AND VERIFIED WITH: PAM CRAWFORD AT 1406 10/03/2014 DV    Culture   Final    STREPTOCOCCUS SPECIES SUSCEPTIBILITIES TO FOLLOW    Report Status PENDING  Incomplete  Blood culture (routine x 2)     Status: None   Collection Time: 09/15/2014 12:45 AM  Result Value Ref Range Status   Specimen Description BLOOD  Final   Special Requests NONE  Final   Culture  Setup Time   Final    GRAM POSITIVE COCCI IN CHAINS IN BOTH AEROBIC AND ANAEROBIC BOTTLES CRITICAL RESULT CALLED TO, READ BACK BY AND VERIFIED WITH: PAM CRAWFORD AT 1406 09/29/2014  DV    Culture   Final    STREPTOCOCCUS SPECIES REFER TO ACCESSION [id number] COLLECTED ON 09/12/2014 AT 0045 FOR SENSITIVITIES    Report Status 09/30/2014 FINAL  Final    Coagulation Studies:  Recent Labs  09/09/2014 0045  LABPROT 27.8*  INR 2.58    Urinalysis:  Recent Labs  09/17/2014 0110  COLORURINE AMBER*  LABSPEC 1.016  PHURINE 5.0  GLUCOSEU NEGATIVE  HGBUR 1+*  BILIRUBINUR 2+*  KETONESUR NEGATIVE  PROTEINUR 100*  NITRITE NEGATIVE  LEUKOCYTESUR NEGATIVE      Imaging: No results found.   Medications:   . sodium chloride 1,000 mL (09/06/2014 0258)  . sodium chloride 125 mL/hr at 09/14/2014 0500  . sodium chloride Stopped (09/14/2014 0549)  . norepinephrine (LEVOPHED) Adult infusion 34.987 mcg/min (09/30/14 0814)  . phenylephrine (NEO-SYNEPHRINE) Adult infusion 30 mcg/min (09/30/14 0814)  . vasopressin (PITRESSIN) infusion - *FOR SHOCK* 0.03 Units/min (09/30/14 0814)    . albumin human  12.5 g Intravenous Q6H  . antiseptic oral rinse  7 mL Mouth Rinse q12n4p  . chlorhexidine  15 mL Mouth Rinse BID  . feeding supplement (RESOURCE BREEZE)  1 Container Oral BID BM  . heparin  5,000 Units Subcutaneous 3 times per day  . hydrocortisone sod succinate (SOLU-CORTEF) inj  50 mg Intravenous Q6H  . midodrine  10 mg Oral TID  . vancomycin  1,000 mg Intravenous Q24H   acetaminophen **OR** acetaminophen, heparin, morphine injection, ondansetron **OR** ondansetron (ZOFRAN) IV, polyethylene glycol powder  Assessment/ Plan:   64 y.o. African [place] female with diabetes mellitus type 2, hypertension, morbid obesity, osteoarthritis, status post right knee surgery, nephrotic syndrome with edema, hypoalbuminemia, renal insufficiency and proteinuria.  1. ARF with CKD st 3 . Baseline cr 1.3. (08/2014)  2. Nephrotic Syndrome with proteinuria: Renal Amyloidosis.  3. Generalized Edema 4. Hypoalbuminemia 5. Right groin cellulitis /sepsis- strep species  6. Diabetes Mellitus type II 7. Hypotension  Very difficult situation. Hypotension and edema are due to 3rd spacing from underlying cause of amyloidosis Until definitive treatment can be done, will provide oncotic support with iv albumin recognizing that it is a temporary measure.  WBC increasing likely from Steroids and strep cellulitis CRRT started on 5/25. UF rate 100 cc/hr. Net i/o +1000 last 24 hrs Dose meds for Cr Cl < 10. Concentrate all ivs as much as possible Poor prognosis - discussed with family- son   LOS: 2 [name] 5/27/20169:36 AM

## 2014-09-30 NOTE — Progress Notes (Signed)
Pt remains lethargic and weak. Pt has c/o pain x1 and moans when touched. Pt . Blister to rt side has opened serous fluid expelled and remains leaking area covered . Pt had family at bedside this shift all night supportive in care . Pt cont on CRRT. Tolerated tx. Well. Foley 30ml output. Pt cont on 3 pressors with minimal titration .

## 2014-09-30 NOTE — Consult Note (Signed)
Palliative Medicine Inpatient Consult Follow Up Note   Name: [name] Date: 09/30/2014 MRN: [id number]  DOB: 09-28-1950  Referring Physician: Hillary Bow, MD  Palliative Care consult requested for this 64 y.o. female for goals of medical therapy in patient with amyloidosis (markers - for multiple myeloma), CKD stage III, T2DM, HTN, h/o uterine fibroids, and s/p total abdominal hysterectomy.  Pt resting in bed on CRRT and three pressors. Pt is lethargic.  Son at bedside.   REVIEW OF SYSTEMS:  Pain: None Dyspnea: No Nausea/Vomiting: No Diarrhea: No Fatigue: Yes All other systems were reviewed and found to be negative  CODE STATUS: Full code   PAST MEDICAL HISTORY: Past Medical History  Diagnosis Date  . HTN (hypertension)   . Diabetes mellitus type II   . Obesity   . Arthritis   . Chronic kidney disease   . Amyloidosis     PAST SURGICAL HISTORY:  Past Surgical History  Procedure Laterality Date  . Total abdominal hysterectomy  10/1999    fibroids  . Combined abdominoplasty and liposuction  07/2000  . Total knee arthroplasty Right 09/28/2013    Procedure: RIGHT TOTAL KNEE ARTHROPLASTY;  Surgeon: Mauri Pole, MD;  Location: [facility];  Service: Orthopedics;  Laterality: Right;    Vital Signs: BP 77/61 mmHg  Pulse 68  Temp(Src) 98.1 F (36.7 C) (Oral)  Resp 18  Ht _0  (1.549 m)  Wt 106.414 kg (234 lb 9.6 oz)  BMI 44.35 kg/m2  SpO2 99% Filed Weights   10/02/2014 0500 09/29/14 0500 09/30/14 0600  Weight: 107 kg (235 lb 14.3 oz) 111.449 kg (245 lb 11.2 oz) 106.414 kg (234 lb 9.6 oz)    Estimated body mass index is 44.35 kg/(m^2) as calculated from the following:   Height as of this encounter: _1  (1.549 m).   Weight as of this encounter: 106.414 kg (234 lb 9.6 oz).  PHYSICAL EXAM: Generall: Critically ill appearing HEENT: OP clear, ETT present Neck: Trachea midline  Cardiovascular: regular rate and rhythm Pulmonary/Chest: good air movemnt  ant fields, no audible wheeze Abdominal: Soft. Hypoactive bowel sounds GU: Foley present, scant dark urine Extremities: 3 + pitting edema BLE's and BUE's Neurological: Opens eyes to voice, moves extremities Skin: Warm, dry and intact. R flank wound with gauze, CDI Psychiatric: Unable to assess  LABS: CBC:    Component Value Date/Time   WBC 32.7* 09/30/2014 0836   WBC 9.2 08/22/2014 1712   HGB 10.0* 09/30/2014 0836   HGB 12.3 08/22/2014 1712   HCT 31.3* 09/30/2014 0836   HCT 38.3 08/22/2014 1712   PLT 149* 09/30/2014 0836   PLT 503* 08/22/2014 1712   MCV 88.9 09/30/2014 0836   MCV 87 08/22/2014 1712   NEUTROABS 5.3 09/08/2014 0730   NEUTROABS 4.0 08/22/2014 1712   LYMPHSABS 2.5 09/08/2014 0730   LYMPHSABS 4.2* 08/22/2014 1712   MONOABS 0.8 09/08/2014 0730   MONOABS 0.9 08/22/2014 1712   EOSABS 0.0 09/08/2014 0730   EOSABS 0.0 08/22/2014 1712   BASOSABS 0.0 09/08/2014 0730   BASOSABS 0.1 08/22/2014 1712   Comprehensive Metabolic Panel:    Component Value Date/Time   NA 131* 09/30/2014 0836   NA 136 08/22/2014 1712   K 4.2 09/30/2014 0836   K 2.6* 08/22/2014 1712   CL 100* 09/30/2014 0836   CL 92* 08/22/2014 1712   CO2 24 09/30/2014 0836   CO2 35* 08/22/2014 1712   BUN 17 09/30/2014 0836   BUN 29* 08/22/2014 1712  CREATININE 1.11* 09/30/2014 0836   CREATININE 1.78* 08/22/2014 1712   GLUCOSE 112* 09/30/2014 0836   GLUCOSE 167* 08/22/2014 1712   CALCIUM 7.7* 09/30/2014 0836   CALCIUM 8.2* 08/22/2014 1712   AST 75* 09/13/2014 1403   AST 40 08/22/2014 1712   ALT 19 09/09/2014 1403   ALT 22 08/22/2014 1712   ALKPHOS 656* 09/04/2014 1403   ALKPHOS 294* 08/22/2014 1712   BILITOT 5.8* 09/08/2014 1403   PROT 4.2* 09/27/2014 1403   PROT 5.0* 08/22/2014 1712   ALBUMIN <1.0* 09/30/2014 0836   ALBUMIN 1.7* 08/22/2014 1712    IMPRESSION:  Ms. Arteaga is a 64 y.o. female with amyloidosis (markers - for multiple myeloma), CKD stage III, T2DM, HTN, h/o uterine fibroids,  and s/p total abdominal hysterectomy. Pt presented to ER from home with AMS. ER workup significant for lactic acidosis, severe hypoalbuminemia, and ARF. Pt admitted to CCU for treatment and evaluation of sepsis. Family at bedside.  Pt with increase in leukocytosis today. Still awaiting sensitivities.  Pt continues to be on CRRT and three pressors.  Son at bedside and went over current care .  Son confirms aggressive treatment is wanted including Full Code and long term dialysis if indicated.    PLAN: 1. Full Code     More than 50% of the visit was spent in counseling/coordination of care: YES  Time Spent: 25 minutes

## 2014-09-30 NOTE — Progress Notes (Signed)
[place] [facility] [place] NAME: [name]    MR#:  [id number]  DATE OF BIRTH:  [birth year]  SUBJECTIVE:  CHIEF COMPLAINT:   Chief Complaint  Patient presents with  . Altered Mental Status    Critically ill. Moans. On 3 pressors. Not eating. Anuric. Son at bedside. Getting CRRT.  REVIEW OF SYSTEMS:    ROS  Unable to give history due to encephalopathy     DRUG ALLERGIES:  No Known Allergies  VITALS:  Blood pressure 86/77, pulse 68, temperature 98.1 F (36.7 C), temperature source Oral, resp. rate 20, height 5\' 1"  (1.549 m), weight 106.414 kg (234 lb 9.6 oz), SpO2 95 %.  PHYSICAL EXAMINATION:   Physical Exam  GENERAL:  64 y.o.-year-old patient lying in the bed drowzy. EYES: Pupils equal, round, reactive to light and accommodation. No scleral icterus. Extraocular muscles intact. HEENT: Head atraumatic, normocephalic. Oropharynx and nasopharynx clear. NECK:  Supple, no jugular venous distention. No thyroid enlargement, no tenderness. LUNGS: Normal breath sounds bilaterally, no wheezing, rales, rhonchi. No use of accessory muscles of respiration. CARDIOVASCULAR: S1, S2 normal. No murmurs, rubs, or gallops. ABDOMEN: Soft, nontender, nondistended. Bowel sounds present. No organomegaly or mass. EXTREMITIES: No cyanosis, clubbing. Anasarca. Open superficial ulcer right lateral thigh. NEUROLOGIC: Drowzy.lethargic. Moves all extremities. PSYCHIATRIC: The patient is drowzy. SKIN:  Erythema with warmth right abdominal wall, groin. Blister lateral thigh  LABORATORY PANEL:   CBC  Recent Labs Lab 09/30/14 0836  WBC 32.7*  HGB 10.0*  HCT 31.3*  PLT 149*   ------------------------------------------------------------------------------------------------------------------  Chemistries   Recent Labs Lab 09/05/2014 1403  09/30/14 0436 09/30/14 0836  NA 134*  < >  --  131*  K 2.8*  < >  --  4.2  CL 97*  < >  --  100*  CO2  21*  < >  --  24  GLUCOSE 86  < >  --  112*  BUN 47*  < >  --  17  CREATININE 2.31*  < >  --  1.11*  CALCIUM 7.1*  < >  --  7.7*  MG  --   < > 1.6*  --   AST 75*  --   --   --   ALT 19  --   --   --   ALKPHOS 656*  --   --   --   BILITOT 5.8*  --   --   --   < > = values in this interval not displayed. ------------------------------------------------------------------------------------------------------------------  Cardiac Enzymes No results for input(s): TROPONINI in the last 168 hours. ------------------------------------------------------------------------------------------------------------------  RADIOLOGY:  No results found.   ASSESSMENT AND PLAN:   64 year old African American female history of nephrotic syndrome, amyloidosis, essential hypertension presenting with altered mental status/confusion after apparently falling out of bed. Admitted for septic shock  * Septic shock with UTI and right flank abdominal wall cellulitis with streptococcus bacteremia On broad spectrum abx. Worsening with 3 pressors now. Consult ID for further input. Discussed with Dr. Ola Spurr.  Echo-No vegetations. On solucortef Discussed with Dr. Mortimer Fries.  * Acute renal failure over CKD3 due to ATN. From septic shock. On CRRT. Appreciate nephrology help. Severe hypoalbuminemia. Getting IV albumin.  * Acute encephalopathy - Due to acute illness. CT head nothing acute.  * Anemia of chronic disease- stable  * Type 2 diabetes, uncomplicated: Hold oral agents, at insulin slide scale every 6 hours Accu-Chek  * Essential hypertension: Hold all agents  *  Venous thromboembolism prophylactic: Heparin subcutaneous   All the records are reviewed and case discussed with Care Management/Social Workerr. Management plans discussed with the patient, family and they are in agreement.  CODE STATUS: FULL CODE  Discussed with son at length. Explained poor prognosis and no good response to aggressive  treatment. Also discussed with Dr. Phifer/ Dr. Candiss Norse  DVT Prophylaxis: Heparin  TOTAL CRITICAL CARE TIME TAKING CARE OF THIS PATIENT: 45 minutes.   POSSIBLE D/C IN 3-4 DAYS, DEPENDING ON CLINICAL CONDITION.   Hillary Bow R M.D on 09/30/2014 at 10:49 AM  Between 7am to 6pm - Pager - [phone]  After 6pm go to [email or web address] - password [email or web address] [facility]  [facility]  Office  [phone]  CC: Primary care physician; [name], MD

## 2014-09-30 NOTE — Consult Note (Signed)
[facility] Infectious Disease     Reason for Consult Sepsis, cellultis    Referring Physician: Hillary Bow Date of Admission:  09/24/2014   Active Problems:   Septic shock   HPI: [name] is a 64 y.o. 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 Has been treated with CVVHD and IV abx. She also has BCX + strep species. Cellulitis is starting to develop bullae and denude.  Past Medical History  Diagnosis Date  . HTN (hypertension)   . Diabetes mellitus type II   . Obesity   . Arthritis   . Chronic kidney disease   . Amyloidosis    Past Surgical History  Procedure Laterality Date  . Total abdominal hysterectomy  10/1999    fibroids  . Combined abdominoplasty and liposuction  07/2000  . Total knee arthroplasty Right 09/28/2013    Procedure: RIGHT TOTAL KNEE ARTHROPLASTY;  Surgeon: Mauri Pole, MD;  Location: [facility];  Service: Orthopedics;  Laterality: Right;   History  Substance Use Topics  . Smoking status: Never Smoker   . Smokeless tobacco: Never Used  . Alcohol Use: No   Family History  Problem Relation Age of Onset  . Arthritis Mother   . Hypertension Mother   . Cancer Mother     breast, lung  . Diabetes Mother   . Hypertension Father     Allergies: No Known Allergies  Current antibiotics: Antibiotics Given (last 72 hours)    Date/Time Action Medication Dose Rate   09/21/2014 0649 Given   piperacillin-tazobactam (ZOSYN) IVPB 3.375 g 3.375 g 12.5 mL/hr   09/27/2014 1558 Given   piperacillin-tazobactam (ZOSYN) IVPB 3.375 g 3.375 g 12.5 mL/hr   09/17/2014 1635 Given   vancomycin (VANCOCIN) IVPB 1000 mg/200 mL premix 1,000 mg 200 mL/hr   09/16/2014 2148 Given   piperacillin-tazobactam (ZOSYN) IVPB 3.375 g 3.375 g 12.5 mL/hr   09/29/14 0510 Given   piperacillin-tazobactam (ZOSYN) IVPB 3.375 g 3.375 g 12.5 mL/hr    09/29/14 1421 Given   vancomycin (VANCOCIN) IVPB 1000 mg/200 mL premix 1,000 mg 200 mL/hr   09/30/14 1419 Given  [unable to give until after vanc trough drawn]   vancomycin (VANCOCIN) IVPB 1000 mg/200 mL premix 1,000 mg 200 mL/hr      MEDICATIONS: . albumin human  12.5 g Intravenous Q6H  . antiseptic oral rinse  7 mL Mouth Rinse q12n4p  . chlorhexidine  15 mL Mouth Rinse BID  . feeding supplement (RESOURCE BREEZE)  1 Container Oral BID BM  . heparin  5,000 Units Subcutaneous 3 times per day  . hydrocortisone sod succinate (SOLU-CORTEF) inj  50 mg Intravenous Q6H  . midodrine  10 mg Oral TID  . vancomycin  1,000 mg Intravenous Q24H    Review of Systems - 11 systems reviewed and negative per HPI   OBJECTIVE: Temp:  [97.8 F (36.6 C)-98.8 F (37.1 C)] 97.8 F (36.6 C) (05/27 1100) Pulse Rate:  [68] 68 (05/26 1900) Resp:  [16-30] 18 (05/27 1340) BP: (74-182)/(37-148) 90/63 mmHg (05/27 1340) SpO2:  [94 %-99 %] 95 % (05/27 1340) Weight:  [106.414 kg (234 lb 9.6 oz)] 106.414 kg (234 lb 9.6 oz) (05/27 0600) GEN critically ill HEAD: Normocephalic, atraumatic.  EYES: Pupils equal, round, reactive to light. extraocular muscles intact No scleral icterus.  MOUTH: Dry mucosal membrane. Dentition intact. No abscess noted.  EAR,  NOSE, THROAT: Clear without exudates. No external lesions.  NECK: Supple. No thyromegaly. No nodules. No JVD.  PULMONARY: Coarse breath sounds throughout without frank wheeze rails or rhonci. No use of accessory muscles, Good respiratory effort. good air entry bilaterally CHEST: Nontender to palpation.  CARDIOVASCULAR: S1 and S2. Tachycardic. No murmurs, rubs, or gallops. 3+ edema. Pedal pulses diminished bilaterally.  GASTROINTESTINAL: Soft, nontender, nondistended. No masses. Positive bowel sounds. No hepatosplenomegaly.  MUSCULOSKELETAL: No swelling, clubbing, or edema. Passive Range of motion full in all extremities.  NEUROLOGIC: confused,  moans SKIN: Large area of erythema entire right flank downwards to vagina, somewhat cool to touch - large area of denuded bllae, no drainage of purulencePSYCHIATRIC: She is awake, oriented to person, has difficulty providing meaningful information given mental status/medical condition Access R IJ, R HD cath in femoral location Foley in place  LABS: Results for orders placed or performed during the hospital encounter of 09/27/2014 (from the past 48 hour(s))  Renal function panel     Status: Abnormal   Collection Time: 09/25/2014  6:51 PM  Result Value Ref Range   Sodium 134 (L) 135 - 145 mmol/L   Potassium 3.4 (L) 3.5 - 5.1 mmol/L   Chloride 98 (L) 101 - 111 mmol/L   CO2 23 22 - 32 mmol/L   Glucose, Bld 90 65 - 99 mg/dL   BUN 41 (H) 6 - 20 mg/dL   Creatinine, Ser 2.01 (H) 0.44 - 1.00 mg/dL   Calcium 7.2 (L) 8.9 - 10.3 mg/dL   Phosphorus 3.8 2.5 - 4.6 mg/dL   Albumin <1.0 (L) 3.5 - 5.0 g/dL   GFR calc non Af Amer 25 (L) >60 mL/min   GFR calc Af Amer 29 (L) >60 mL/min    Comment: (NOTE) The eGFR has been calculated using the CKD EPI equation. This calculation has not been validated in all clinical situations. eGFR's persistently <60 mL/min signify possible Chronic Kidney Disease.    Anion gap 13 5 - 15  Blood gas, arterial     Status: Abnormal   Collection Time: 09/15/2014  7:33 PM  Result Value Ref Range   FIO2 0.28 %   Delivery systems NASAL CANNULA    pH, Arterial 7.40 7.350 - 7.450   pCO2 arterial 34 32.0 - 48.0 mmHg   pO2, Arterial 61 (L) 83.0 - 108.0 mmHg   Bicarbonate 21.1 21.0 - 28.0 mEq/L   Acid-base deficit 3.0 (H) 0.0 - 2.0 mmol/L   O2 Saturation 91.0 %   Patient temperature 37.0    Collection site RIGHT RADIAL    Sample type ARTERIAL DRAW    Allens test (pass/fail) POSITIVE (A) PASS  Basic metabolic panel     Status: Abnormal   Collection Time: 09/29/14  4:09 AM  Result Value Ref Range   Sodium 133 (L) 135 - 145 mmol/L   Potassium 4.0 3.5 - 5.1 mmol/L   Chloride  101 101 - 111 mmol/L   CO2 24 22 - 32 mmol/L   Glucose, Bld 82 65 - 99 mg/dL   BUN 31 (H) 6 - 20 mg/dL   Creatinine, Ser 1.53 (H) 0.44 - 1.00 mg/dL   Calcium 7.6 (L) 8.9 - 10.3 mg/dL   GFR calc non Af Amer 35 (L) >60 mL/min   GFR calc Af Amer 40 (L) >60 mL/min    Comment: (NOTE) The eGFR has been calculated using the CKD EPI equation. This calculation has not been validated in all clinical situations. eGFR's persistently <60 mL/min signify   possible Chronic Kidney Disease.    Anion gap 8 5 - 15  Magnesium     Status: Abnormal   Collection Time: 09/29/14  4:09 AM  Result Value Ref Range   Magnesium 1.4 (L) 1.7 - 2.4 mg/dL  Phosphorus     Status: None   Collection Time: 09/29/14  4:09 AM  Result Value Ref Range   Phosphorus 3.7 2.5 - 4.6 mg/dL  CBC     Status: Abnormal   Collection Time: 09/29/14  4:37 AM  Result Value Ref Range   WBC 21.4 (H) 3.6 - 11.0 K/uL   RBC 3.76 (L) 3.80 - 5.20 MIL/uL   Hemoglobin 10.9 (L) 12.0 - 16.0 g/dL   HCT 33.5 (L) 35.0 - 47.0 %   MCV 89.1 80.0 - 100.0 fL   MCH 29.0 26.0 - 34.0 pg   MCHC 32.6 32.0 - 36.0 g/dL   RDW 19.9 (H) 11.5 - 14.5 %   Platelets 204 150 - 440 K/uL  APTT     Status: Abnormal   Collection Time: 09/29/14  4:37 AM  Result Value Ref Range   aPTT 54 (H) 24 - 36 seconds    Comment:        IF BASELINE aPTT IS ELEVATED, SUGGEST PATIENT RISK ASSESSMENT BE USED TO DETERMINE APPROPRIATE ANTICOAGULANT THERAPY.   Renal function     Status: Abnormal   Collection Time: 09/29/14  8:30 AM  Result Value Ref Range   Sodium 132 (L) 135 - 145 mmol/L   Potassium 4.0 3.5 - 5.1 mmol/L   Chloride 99 (L) 101 - 111 mmol/L   CO2 23 22 - 32 mmol/L   Glucose, Bld 89 65 - 99 mg/dL   BUN 28 (H) 6 - 20 mg/dL   Creatinine, Ser 1.53 (H) 0.44 - 1.00 mg/dL   Calcium 7.4 (L) 8.9 - 10.3 mg/dL   Phosphorus 3.5 2.5 - 4.6 mg/dL   Albumin <1.0 (L) 3.5 - 5.0 g/dL   GFR calc non Af Amer 35 (L) >60 mL/min   GFR calc Af Amer 40 (L) >60 mL/min    Comment:  (NOTE) The eGFR has been calculated using the CKD EPI equation. This calculation has not been validated in all clinical situations. eGFR's persistently <60 mL/min signify possible Chronic Kidney Disease.    Anion gap 10 5 - 15  Renal function     Status: Abnormal   Collection Time: 09/29/14  3:22 PM  Result Value Ref Range   Sodium 133 (L) 135 - 145 mmol/L   Potassium 4.3 3.5 - 5.1 mmol/L   Chloride 98 (L) 101 - 111 mmol/L   CO2 23 22 - 32 mmol/L   Glucose, Bld 104 (H) 65 - 99 mg/dL   BUN 24 (H) 6 - 20 mg/dL   Creatinine, Ser 1.34 (H) 0.44 - 1.00 mg/dL   Calcium 7.8 (L) 8.9 - 10.3 mg/dL   Phosphorus 2.9 2.5 - 4.6 mg/dL   Albumin <1.0 (L) 3.5 - 5.0 g/dL   GFR calc non Af Amer 41 (L) >60 mL/min   GFR calc Af Amer 47 (L) >60 mL/min    Comment: (NOTE) The eGFR has been calculated using the CKD EPI equation. This calculation has not been validated in all clinical situations. eGFR's persistently <60 mL/min signify possible Chronic Kidney Disease.    Anion gap 12 5 - 15  Renal function     Status: Abnormal   Collection Time: 09/29/14 11:56 PM  Result Value Ref Range  Sodium 133 (L) 135 - 145 mmol/L   Potassium 4.3 3.5 - 5.1 mmol/L   Chloride 99 (L) 101 - 111 mmol/L   CO2 25 22 - 32 mmol/L   Glucose, Bld 110 (H) 65 - 99 mg/dL   BUN 20 6 - 20 mg/dL   Creatinine, Ser 1.19 (H) 0.44 - 1.00 mg/dL   Calcium 7.8 (L) 8.9 - 10.3 mg/dL   Phosphorus 2.6 2.5 - 4.6 mg/dL   Albumin <1.0 (L) 3.5 - 5.0 g/dL   GFR calc non Af Amer 47 (L) >60 mL/min   GFR calc Af Amer 54 (L) >60 mL/min    Comment: (NOTE) The eGFR has been calculated using the CKD EPI equation. This calculation has not been validated in all clinical situations. eGFR's persistently <60 mL/min signify possible Chronic Kidney Disease.    Anion gap 9 5 - 15  Magnesium     Status: Abnormal   Collection Time: 09/30/14  4:36 AM  Result Value Ref Range   Magnesium 1.6 (L) 1.7 - 2.4 mg/dL  APTT     Status: Abnormal   Collection  Time: 09/30/14  4:36 AM  Result Value Ref Range   aPTT 57 (H) 24 - 36 seconds    Comment:        IF BASELINE aPTT IS ELEVATED, SUGGEST PATIENT RISK ASSESSMENT BE USED TO DETERMINE APPROPRIATE ANTICOAGULANT THERAPY.   Renal function     Status: Abnormal   Collection Time: 09/30/14  8:36 AM  Result Value Ref Range   Sodium 131 (L) 135 - 145 mmol/L   Potassium 4.2 3.5 - 5.1 mmol/L   Chloride 100 (L) 101 - 111 mmol/L   CO2 24 22 - 32 mmol/L   Glucose, Bld 112 (H) 65 - 99 mg/dL   BUN 17 6 - 20 mg/dL   Creatinine, Ser 1.11 (H) 0.44 - 1.00 mg/dL   Calcium 7.7 (L) 8.9 - 10.3 mg/dL   Phosphorus 2.1 (L) 2.5 - 4.6 mg/dL   Albumin <1.0 (L) 3.5 - 5.0 g/dL   GFR calc non Af Amer 51 (L) >60 mL/min   GFR calc Af Amer 59 (L) >60 mL/min    Comment: (NOTE) The eGFR has been calculated using the CKD EPI equation. This calculation has not been validated in all clinical situations. eGFR's persistently <60 mL/min signify possible Chronic Kidney Disease.    Anion gap 7 5 - 15  CBC     Status: Abnormal   Collection Time: 09/30/14  8:36 AM  Result Value Ref Range   WBC 32.7 (H) 3.6 - 11.0 K/uL   RBC 3.53 (L) 3.80 - 5.20 MIL/uL   Hemoglobin 10.0 (L) 12.0 - 16.0 g/dL   HCT 31.3 (L) 35.0 - 47.0 %   MCV 88.9 80.0 - 100.0 fL   MCH 28.3 26.0 - 34.0 pg   MCHC 31.9 (L) 32.0 - 36.0 g/dL   RDW 20.4 (H) 11.5 - 14.5 %   Platelets 149 (L) 150 - 440 K/uL  Blood gas, venous     Status: Abnormal   Collection Time: 09/30/14 10:00 AM  Result Value Ref Range   FIO2 0.28 %   pH, Ven 7.40 7.320 - 7.430   pCO2, Ven 40 (L) 44.0 - 60.0 mmHg   Bicarbonate 24.8 21.0 - 28.0 mEq/L   Acid-Base Excess 0.0 0.0 - 3.0 mmol/L   Patient temperature 37.0    Collection site VEIN    Sample type VEIN   Renal function       Status: Abnormal   Collection Time: 09/30/14  1:45 PM  Result Value Ref Range   Sodium 133 (L) 135 - 145 mmol/L   Potassium 4.3 3.5 - 5.1 mmol/L   Chloride 101 101 - 111 mmol/L   CO2 25 22 - 32 mmol/L    Glucose, Bld 111 (H) 65 - 99 mg/dL   BUN 16 6 - 20 mg/dL   Creatinine, Ser 1.01 (H) 0.44 - 1.00 mg/dL   Calcium 7.9 (L) 8.9 - 10.3 mg/dL   Phosphorus 1.9 (L) 2.5 - 4.6 mg/dL   Albumin 1.1 (L) 3.5 - 5.0 g/dL   GFR calc non Af Amer 57 (L) >60 mL/min   GFR calc Af Amer >60 >60 mL/min    Comment: (NOTE) The eGFR has been calculated using the CKD EPI equation. This calculation has not been validated in all clinical situations. eGFR's persistently <60 mL/min signify possible Chronic Kidney Disease.    Anion gap 7 5 - 15  Vancomycin, trough     Status: None   Collection Time: 09/30/14  1:55 PM  Result Value Ref Range   Vancomycin Tr 10 10 - 20 ug/mL   No components found for: ESR, C REACTIVE PROTEIN MICRO: Recent Results (from the past 720 hour(s))  Blood culture (routine x 2)     Status: None (Preliminary result)   Collection Time: 10/01/2014 12:45 AM  Result Value Ref Range Status   Specimen Description BLOOD  Final   Special Requests NONE  Final   Culture  Setup Time   Final    GRAM POSITIVE COCCI IN CHAINS IN BOTH AEROBIC AND ANAEROBIC BOTTLES CRITICAL RESULT CALLED TO, READ BACK BY AND VERIFIED WITH: PAM CRAWFORD AT 1406 09/27/2014 DV    Culture   Final    STREPTOCOCCUS SPECIES SUSCEPTIBILITIES TO FOLLOW    Report Status PENDING  Incomplete  Blood culture (routine x 2)     Status: None   Collection Time: 09/20/2014 12:45 AM  Result Value Ref Range Status   Specimen Description BLOOD  Final   Special Requests NONE  Final   Culture  Setup Time   Final    GRAM POSITIVE COCCI IN CHAINS IN BOTH AEROBIC AND ANAEROBIC BOTTLES CRITICAL RESULT CALLED TO, READ BACK BY AND VERIFIED WITH: PAM CRAWFORD AT 1406 09/05/2014 DV    Culture   Final    STREPTOCOCCUS SPECIES REFER TO ACCESSION [id number] COLLECTED ON 10/02/2014 AT 0045 FOR SENSITIVITIES    Report Status 09/30/2014 FINAL  Final    IMAGING: Dg Chest 1 View  10/02/2014   CLINICAL DATA:  Central line placement.  EXAM: CHEST  1 VIEW   COMPARISON:  06/26/2014  FINDINGS: Right IJ central line with tip crossing midline and terminating in the region of the left brachiocephalic vein.  Hypoventilation. When accounting for this there is no cardiomegaly or change in aortic tortuosity. No pneumothorax, edema, effusion, or definitive pneumonia.  These results were called by telephone at the time of interpretation on 09/13/2014 at 3:14 am to RN Steven , who verbally acknowledged these results.  IMPRESSION: 1. Right IJ central line with tip crossing midline and projecting at the left brachiocephalic vein. No pneumothorax. 2. Hypoventilation.   Electronically Signed   By: Jonathon  Watts M.D.   On: 09/24/2014 03:15   Ct Head Wo Contrast  09/25/2014   CLINICAL DATA:  Altered mental status after fall from couch. Posterior neck pain. Initial encounter.  EXAM: CT HEAD WITHOUT CONTRAST  CT CERVICAL SPINE WITHOUT   CONTRAST  TECHNIQUE: Multidetector CT imaging of the head and cervical spine was performed following the standard protocol without intravenous contrast. Multiplanar CT image reconstructions of the cervical spine were also generated.  COMPARISON:  None.  FINDINGS: CT HEAD FINDINGS  Skull and Sinuses:Negative for fracture or destructive process. The mastoids, middle ears, and imaged paranasal sinuses are clear.  Orbits: No acute abnormality.  Brain: No evidence of acute infarction, hemorrhage, hydrocephalus, or mass lesion/mass effect.  CT CERVICAL SPINE FINDINGS  Thyroid gland has indistinct margins with mild surrounding fat stranding. A sub cm dense nodule is noted on the right. No surrounding adenopathy.  No evidence of acute fracture or traumatic malalignment. No gross cervical canal hematoma. There is degenerative disc disease and endplate spurring, most progressed at C4-5, C5-6, and C6-7. Spurring is most notable at C5-6 with right uncovertebral spurs causing advanced foraminal stenosis. A superimposed right paracentral disc herniation with vacuum  phenomenon contributes to the C5-6 right foraminal stenosis.  IMPRESSION: 1. No evidence of intracranial or cervical spine injury. 2. Mid and lower cervical degenerative disc disease with foraminal stenoses, most advanced on the right at C5-6. 3. Edema noted around the thyroid gland, suspicious for thyroiditis. Correlate with endocrine history and thyroid function tests.   Electronically Signed   By: Jonathon  Watts M.D.   On: 09/20/2014 02:31   Ct Cervical Spine Wo Contrast  09/16/2014   CLINICAL DATA:  Altered mental status after fall from couch. Posterior neck pain. Initial encounter.  EXAM: CT HEAD WITHOUT CONTRAST  CT CERVICAL SPINE WITHOUT CONTRAST  TECHNIQUE: Multidetector CT imaging of the head and cervical spine was performed following the standard protocol without intravenous contrast. Multiplanar CT image reconstructions of the cervical spine were also generated.  COMPARISON:  None.  FINDINGS: CT HEAD FINDINGS  Skull and Sinuses:Negative for fracture or destructive process. The mastoids, middle ears, and imaged paranasal sinuses are clear.  Orbits: No acute abnormality.  Brain: No evidence of acute infarction, hemorrhage, hydrocephalus, or mass lesion/mass effect.  CT CERVICAL SPINE FINDINGS  Thyroid gland has indistinct margins with mild surrounding fat stranding. A sub cm dense nodule is noted on the right. No surrounding adenopathy.  No evidence of acute fracture or traumatic malalignment. No gross cervical canal hematoma. There is degenerative disc disease and endplate spurring, most progressed at C4-5, C5-6, and C6-7. Spurring is most notable at C5-6 with right uncovertebral spurs causing advanced foraminal stenosis. A superimposed right paracentral disc herniation with vacuum phenomenon contributes to the C5-6 right foraminal stenosis.  IMPRESSION: 1. No evidence of intracranial or cervical spine injury. 2. Mid and lower cervical degenerative disc disease with foraminal stenoses, most advanced on  the right at C5-6. 3. Edema noted around the thyroid gland, suspicious for thyroiditis. Correlate with endocrine history and thyroid function tests.   Electronically Signed   By: Jonathon  Watts M.D.   On: 10/04/2014 02:31   Nm Cardiac Muga Rest  09/20/2014   CLINICAL DATA:  . Evaluate cardiac function in relation to chemotherapy.  EXAM: NUCLEAR MEDICINE CARDIAC BLOOD POOL IMAGING (MUGA)  TECHNIQUE: Cardiac multi-gated acquisition was performed at rest following intravenous injection of Tc-99m labeled red blood cells.  RADIOPHARMACEUTICALS:  21.9 mCi Technetium-99m in-vitro labeled red blood cells IV  COMPARISON:  None.  FINDINGS: No  focal wall motion abnormality of the left ventricle.  Calculated left ventricular ejection fraction equals 65%  IMPRESSION: Left ventricular ejection fraction equals 65%   Electronically Signed   By: Stewart  Edmunds M.D.     On: 09/20/2014 09:31   Ct Biopsy  09/08/2014   CLINICAL DATA:  Amyloidosis, nephrotic syndrome, hypertension  EXAM: CT GUIDED RIGHT ILIAC BONE MARROW ASPIRATION AND CORE BIOPSY  Date:  5/5/20165/09/2014 9:32 am  Radiologist:  M. Trevor Shick, MD  Guidance:  CT  FLUOROSCOPY TIME:  NONE.  MEDICATIONS AND MEDICAL HISTORY: 1 MG VERSED, 25 MCG FENTANYL  ANESTHESIA/SEDATION: 15 MINUTES  CONTRAST:  None.  COMPLICATIONS: None  PROCEDURE: Informed consent was obtained from the patient following explanation of the procedure, risks, benefits and alternatives. The patient understands, agrees and consents for the procedure. All questions were addressed. A time out was performed.  The patient was positioned prone and noncontrast localization CT was performed of the pelvis to demonstrate the iliac marrow spaces.  Maximal barrier sterile technique utilized including caps, mask, sterile gowns, sterile gloves, large sterile drape, hand hygiene, and betadine prep.  Under sterile conditions and local anesthesia, an 11 gauge coaxial bone biopsy needle was advanced into the right  iliac marrow space. Needle position was confirmed with CT imaging. Initially, bone marrow aspiration was performed. Next, the 11 gauge outer cannula was utilized to obtain a right iliac bone marrow core biopsy. Needle was removed. Hemostasis was obtained with compression. The patient tolerated the procedure well. Samples were prepared with the cytotechnologist. No immediate complications.  IMPRESSION: CT guided right iliac bone marrow aspiration and core biopsy.   Electronically Signed   By: M.  Shick M.D.   On: 09/08/2014 10:43   Dg Chest Portable 1 View  09/26/2014   CLINICAL DATA:  Central line adjustment  EXAM: PORTABLE CHEST - 1 VIEW  COMPARISON:  Same day at 2:43 a.m.  FINDINGS: Shortening of the right IJ central line, but tip still terminates left of midline near the brachiocephalic vein.  Hypoventilation. Stable heart size and mediastinal contours, size accentuated by technique. There is interstitial crowding without edema, effusion, pneumothorax, or overt pneumonia.  IMPRESSION: 1. Despite right central line shortening the tip still terminates near the left brachiocephalic vein. 2. Hypoventilation.   Electronically Signed   By: Jonathon  Watts M.D.   On: 09/29/2014 04:28    Assessment:   [name] is a 63 y.o. 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   Recommendations Change vanco and zosyn to ceftriaxone for streptococcal bacteremia Add clinda for toxin production for now  FU BCX to document clearance  Thank you very much for allowing me to participate in the care of this patient. Please call with questions.   David P. Fitzgerald, MD   

## 2014-09-30 NOTE — Progress Notes (Signed)
Pt remains on CRRT, therapy fluid 1L/hr, UF 100 ml/hr, and Blood flow 250.  Pt remains on vasopressin, levophed, and neosynephrine to maintain map >60.  NSR/sinus tach, lungs clear on 2L Ricardo.  Pt very lethargic, mumbles, pt kept NPO for entirety of shift.  Foley produced 25 ml this shift, performed bladder scan with no urine held in bladder.  Afebrile.

## 2014-10-01 ENCOUNTER — Inpatient Hospital Stay: Payer: Medicare Other

## 2014-10-01 LAB — CULTURE, BLOOD (ROUTINE X 2)

## 2014-10-01 LAB — RENAL FUNCTION PANEL
ALBUMIN: 1.5 g/dL — AB (ref 3.5–5.0)
ANION GAP: 8 (ref 5–15)
ANION GAP: 9 (ref 5–15)
Albumin: 1.4 g/dL — ABNORMAL LOW (ref 3.5–5.0)
Albumin: 1.7 g/dL — ABNORMAL LOW (ref 3.5–5.0)
Albumin: 1.7 g/dL — ABNORMAL LOW (ref 3.5–5.0)
Albumin: 2.4 g/dL — ABNORMAL LOW (ref 3.5–5.0)
Anion gap: 10 (ref 5–15)
Anion gap: 12 (ref 5–15)
Anion gap: 8 (ref 5–15)
BUN: 17 mg/dL (ref 6–20)
BUN: 18 mg/dL (ref 6–20)
BUN: 20 mg/dL (ref 6–20)
BUN: 21 mg/dL — AB (ref 6–20)
BUN: 22 mg/dL — ABNORMAL HIGH (ref 6–20)
CALCIUM: 8 mg/dL — AB (ref 8.9–10.3)
CALCIUM: 8.3 mg/dL — AB (ref 8.9–10.3)
CHLORIDE: 100 mmol/L — AB (ref 101–111)
CHLORIDE: 101 mmol/L (ref 101–111)
CO2: 25 mmol/L (ref 22–32)
CO2: 25 mmol/L (ref 22–32)
CO2: 25 mmol/L (ref 22–32)
CO2: 25 mmol/L (ref 22–32)
CO2: 26 mmol/L (ref 22–32)
CREATININE: 1.21 mg/dL — AB (ref 0.44–1.00)
Calcium: 8 mg/dL — ABNORMAL LOW (ref 8.9–10.3)
Calcium: 8.2 mg/dL — ABNORMAL LOW (ref 8.9–10.3)
Calcium: 8.3 mg/dL — ABNORMAL LOW (ref 8.9–10.3)
Chloride: 100 mmol/L — ABNORMAL LOW (ref 101–111)
Chloride: 99 mmol/L — ABNORMAL LOW (ref 101–111)
Chloride: 99 mmol/L — ABNORMAL LOW (ref 101–111)
Creatinine, Ser: 1.1 mg/dL — ABNORMAL HIGH (ref 0.44–1.00)
Creatinine, Ser: 1.14 mg/dL — ABNORMAL HIGH (ref 0.44–1.00)
Creatinine, Ser: 1.15 mg/dL — ABNORMAL HIGH (ref 0.44–1.00)
Creatinine, Ser: 1.16 mg/dL — ABNORMAL HIGH (ref 0.44–1.00)
GFR calc Af Amer: 53 mL/min — ABNORMAL LOW (ref >60)
GFR calc Af Amer: 60 mL/min — ABNORMAL LOW (ref >60)
GFR calc non Af Amer: 49 mL/min — ABNORMAL LOW (ref >60)
GFR calc non Af Amer: 49 mL/min — ABNORMAL LOW (ref >60)
GFR calc non Af Amer: 48 mL/min — ABNORMAL LOW (ref >60)
GFR, EST AFRICAN AMERICAN: 57 mL/min — AB (ref >60)
GFR, EST AFRICAN AMERICAN: 57 mL/min — AB (ref >60)
GFR, EST AFRICAN AMERICAN: 56 mL/min — AB (ref >60)
GFR, EST NON AFRICAN AMERICAN: 46 mL/min — AB (ref >60)
GFR, EST NON AFRICAN AMERICAN: 52 mL/min — AB (ref >60)
GLUCOSE: 109 mg/dL — AB (ref 65–99)
GLUCOSE: 122 mg/dL — AB (ref 65–99)
GLUCOSE: 142 mg/dL — AB (ref 65–99)
GLUCOSE: 163 mg/dL — AB (ref 65–99)
Glucose, Bld: 156 mg/dL — ABNORMAL HIGH (ref 65–99)
POTASSIUM: 4.6 mmol/L (ref 3.5–5.1)
Phosphorus: 2.1 mg/dL — ABNORMAL LOW (ref 2.5–4.6)
Phosphorus: 2.1 mg/dL — ABNORMAL LOW (ref 2.5–4.6)
Phosphorus: 2.3 mg/dL — ABNORMAL LOW (ref 2.5–4.6)
Phosphorus: 2.5 mg/dL (ref 2.5–4.6)
Phosphorus: 2.7 mg/dL (ref 2.5–4.6)
Potassium: 4.2 mmol/L (ref 3.5–5.1)
Potassium: 4.4 mmol/L (ref 3.5–5.1)
Potassium: 4.5 mmol/L (ref 3.5–5.1)
Potassium: 4.6 mmol/L (ref 3.5–5.1)
SODIUM: 133 mmol/L — AB (ref 135–145)
Sodium: 132 mmol/L — ABNORMAL LOW (ref 135–145)
Sodium: 135 mmol/L (ref 135–145)
Sodium: 135 mmol/L (ref 135–145)
Sodium: 137 mmol/L (ref 135–145)

## 2014-10-01 LAB — APTT: APTT: 61 s — AB (ref 24–36)

## 2014-10-01 LAB — CBC
HCT: 27.4 % — ABNORMAL LOW (ref 35.0–47.0)
Hemoglobin: 8.8 g/dL — ABNORMAL LOW (ref 12.0–16.0)
MCH: 28.3 pg (ref 26.0–34.0)
MCHC: 32 g/dL (ref 32.0–36.0)
MCV: 88.6 fL (ref 80.0–100.0)
PLATELETS: 105 10*3/uL — AB (ref 150–440)
RBC: 3.09 MIL/uL — ABNORMAL LOW (ref 3.80–5.20)
RDW: 20.4 % — AB (ref 11.5–14.5)
WBC: 35.4 10*3/uL — ABNORMAL HIGH (ref 3.6–11.0)

## 2014-10-01 LAB — ALKALINE PHOSPHATASE: ALK PHOS: 520 U/L — AB (ref 38–126)

## 2014-10-01 LAB — BILIRUBIN, FRACTIONATED(TOT/DIR/INDIR)
BILIRUBIN DIRECT: 5.2 mg/dL — AB (ref 0.1–0.5)
BILIRUBIN INDIRECT: 1.8 mg/dL — AB (ref 0.3–0.9)
Total Bilirubin: 7 mg/dL — ABNORMAL HIGH (ref 0.3–1.2)

## 2014-10-01 LAB — AMMONIA: Ammonia: 18 umol/L (ref 9–35)

## 2014-10-01 LAB — PROTEIN, TOTAL: Total Protein: 4.4 g/dL — ABNORMAL LOW (ref 6.5–8.1)

## 2014-10-01 LAB — AST: AST: 84 U/L — AB (ref 15–41)

## 2014-10-01 LAB — MAGNESIUM: Magnesium: 1.8 mg/dL (ref 1.7–2.4)

## 2014-10-01 LAB — ALT: ALT: 25 U/L (ref 14–54)

## 2014-10-01 MED ORDER — ALBUTEROL SULFATE (2.5 MG/3ML) 0.083% IN NEBU
2.5000 mg | INHALATION_SOLUTION | Freq: Four times a day (QID) | RESPIRATORY_TRACT | Status: DC | PRN
  Administered 2014-10-01 – 2014-10-03 (×3): 2.5 mg via RESPIRATORY_TRACT
  Filled 2014-10-01 (×4): qty 3

## 2014-10-01 MED ORDER — LACTULOSE 10 GM/15ML PO SOLN
20.0000 g | Freq: Two times a day (BID) | ORAL | Status: DC
  Administered 2014-10-04 – 2014-10-17 (×28): 20 g via ORAL
  Filled 2014-10-01 (×30): qty 30

## 2014-10-01 MED ORDER — POTASSIUM PHOSPHATES 15 MMOLE/5ML IV SOLN
15.0000 mmol | Freq: Once | INTRAVENOUS | Status: AC
  Administered 2014-10-01: 15 mmol via INTRAVENOUS
  Filled 2014-10-01: qty 5

## 2014-10-01 MED ORDER — ALBUMIN HUMAN 25 % IV SOLN
25.0000 g | Freq: Four times a day (QID) | INTRAVENOUS | Status: DC
  Administered 2014-10-01 – 2014-10-05 (×17): 25 g via INTRAVENOUS
  Filled 2014-10-01 (×22): qty 100

## 2014-10-01 NOTE — Progress Notes (Signed)
Subjective:  Pt progressing well with CRRT.   Good UF noted.  Remains on pressors.   Oligoanuria persists.  Objective:  Vital signs in last 24 hours:  Temp:  [97.8 F (36.6 C)-98.4 F (36.9 C)] 98.3 F (36.8 C) (05/28 0700) Pulse Rate:  [101-109] 101 (05/28 0700) Resp:  [16-26] 18 (05/28 0700) BP: (77-156)/(42-126) 104/67 mmHg (05/28 0700) SpO2:  [88 %-100 %] 100 % (05/28 0700) Weight:  [101.1 kg (222 lb 14.2 oz)] 101.1 kg (222 lb 14.2 oz) (05/28 0500)  Weight change: -5.314 kg (-11 lb 11.4 oz) Filed Weights   09/29/14 0500 09/30/14 0600 10/01/14 0500  Weight: 111.449 kg (245 lb 11.2 oz) 106.414 kg (234 lb 9.6 oz) 101.1 kg (222 lb 14.2 oz)    Intake/Output: I/O last 3 completed shifts: In: 4682.6 [I.V.:3932.6; IV Piggyback:750] Out: 3132 [Urine:30; Other:3102]     Physical Exam: General: NAD, laying in bed  HEENT Eyes closed, moist mucus membranes  Neck supple  Pulm/lungs Decreased breath sounds at bases, normal resp effort  CVS/Heart S1S2 no rubs  Abdomen:  Soft, non tender, BS present  Extremities: +++ anasarca  Neurologic: sedated  Skin: + edema, rt groin cellulitis   Access: Temp cath- rt femoral       Basic Metabolic Panel:  Recent Labs Lab 09/29/14 0409  09/29/14 2356 09/30/14 0436 09/30/14 0836 09/30/14 1345 09/30/14 2337 10/01/14 0611  NA 133*  < > 133*  --  131* 133* 135 132*  K 4.0  < > 4.3  --  4.2 4.3 4.4 4.2  CL 101  < > 99*  --  100* 101 100* 99*  CO2 24  < > 25  --  24 25 25 25   GLUCOSE 82  < > 110*  --  112* 111* 109* 122*  BUN 31*  < > 20  --  17 16 17 18   CREATININE 1.53*  < > 1.19*  --  1.11* 1.01* 1.10* 1.15*  CALCIUM 7.6*  < > 7.8*  --  7.7* 7.9* 8.3* 8.0*  MG 1.4*  --   --  1.6*  --  1.8  --  1.8  PHOS 3.7  < > 2.6  --  2.1* 1.9* 2.1* 2.1*  < > = values in this interval not displayed.   CBC:  Recent Labs Lab 09/08/2014 0045 09/29/14 0437 09/30/14 0836 10/01/14 0611  WBC 5.3 21.4* 32.7* 35.4*  HGB 11.5* 10.9* 10.0*  8.8*  HCT 35.5 33.5* 31.3* 27.4*  MCV 88.0 89.1 88.9 88.6  PLT 311 204 149* 105*      Microbiology: Results for orders placed or performed during the hospital encounter of 09/29/2014  Blood culture (routine x 2)     Status: None (Preliminary result)   Collection Time: 10/04/2014 12:45 AM  Result Value Ref Range Status   Specimen Description BLOOD  Final   Special Requests NONE  Final   Culture  Setup Time   Final    GRAM POSITIVE COCCI IN CHAINS IN BOTH AEROBIC AND ANAEROBIC BOTTLES CRITICAL RESULT CALLED TO, READ BACK BY AND VERIFIED WITH: PAM CRAWFORD AT 1406 10/04/2014 DV    Culture   Final    STREPTOCOCCUS SPECIES SUSCEPTIBILITIES TO FOLLOW    Report Status PENDING  Incomplete  Blood culture (routine x 2)     Status: None   Collection Time: 10/01/2014 12:45 AM  Result Value Ref Range Status   Specimen Description BLOOD  Final   Special Requests NONE  Final  Culture  Setup Time   Final    GRAM POSITIVE COCCI IN CHAINS IN BOTH AEROBIC AND ANAEROBIC BOTTLES CRITICAL RESULT CALLED TO, READ BACK BY AND VERIFIED WITH: PAM CRAWFORD AT 1406 09/22/2014 DV    Culture   Final    STREPTOCOCCUS SPECIES REFER TO ACCESSION [id number] COLLECTED ON 10/03/2014 AT 0045 FOR SENSITIVITIES    Report Status 09/30/2014 FINAL  Final    Coagulation Studies: No results for input(s): LABPROT, INR in the last 72 hours.  Urinalysis: No results for input(s): COLORURINE, LABSPEC, PHURINE, GLUCOSEU, HGBUR, BILIRUBINUR, KETONESUR, PROTEINUR, UROBILINOGEN, NITRITE, LEUKOCYTESUR in the last 72 hours.  Invalid input(s): APPERANCEUR    Imaging: No results found.   Medications:   . norepinephrine (LEVOPHED) Adult infusion 40 mcg/min (10/01/14 0700)  . phenylephrine (NEO-SYNEPHRINE) Adult infusion 25.067 mcg/min (10/01/14 0700)  . pureflow 1,000 mL/hr at 09/30/14 1403  . vasopressin (PITRESSIN) infusion - *FOR SHOCK* 0.03 Units/min (10/01/14 0700)   . albumin human  12.5 g Intravenous Q6H  . antiseptic oral  rinse  7 mL Mouth Rinse q12n4p  . cefTRIAXone (ROCEPHIN)  IV  1 g Intravenous Q24H  . cefTRIAXone (ROCEPHIN)  IV  2 g Intravenous Q24H  . chlorhexidine  15 mL Mouth Rinse BID  . clindamycin (CLEOCIN) IV  600 mg Intravenous 3 times per day  . feeding supplement (RESOURCE BREEZE)  1 Container Oral BID BM  . heparin  5,000 Units Subcutaneous 3 times per day  . hydrocortisone sod succinate (SOLU-CORTEF) inj  50 mg Intravenous Q6H  . midodrine  10 mg Oral TID   acetaminophen **OR** acetaminophen, albuterol, heparin, morphine injection, ondansetron **OR** ondansetron (ZOFRAN) IV, polyethylene glycol powder  Assessment/ Plan:   64 y.o. African [place] female with diabetes mellitus type 2, hypertension, morbid obesity, osteoarthritis, status post right knee surgery, nephrotic syndrome with edema, hypoalbuminemia, renal insufficiency and proteinuria.  1. ARF with CKD st 3 . Baseline cr 1.3. (08/2014)  2. Nephrotic Syndrome with proteinuria: Renal Amyloidosis.  3. Generalized Edema 4. Hypoalbuminemia 5. Right groin cellulitis /sepsis- strep species  6. Diabetes Mellitus type II 7. Hypotension  The patient's condition is likely to progress to end-stage renal disease. Oliguria persists at this time. Therefore we will continue with continuous renal replacement therapy at present. We will increase albumen to 25 g IV every 6 hours and effort to limit third spacing of fluid. Treatment of the underlying condition is needed to improve her condition. For now continue all supportive care.   LOS: 3 [name] 5/28/20167:58 AM

## 2014-10-01 NOTE — Progress Notes (Signed)
[facility] NOTE  Pharmacy Consult for Antibiotic Dosing, Electrolyte Management,CRRT   Indication: Septic Shock, ICU Status   No Known Allergies  Patient Measurements: Height: 5\' 1"  (154.9 cm) Weight: 222 lb 14.2 oz (101.1 kg) IBW/kg (Calculated) : 47.8   Vital Signs: Temp: 98.2 F (36.8 C) (05/28 1200) Temp Source: Oral (05/28 1200) BP: 105/69 mmHg (05/28 1700) Pulse Rate: 37 (05/28 1700) Intake/Output from previous day: 05/27 0701 - 05/28 0700 In: 1248.4 [I.V.:698.4; IV Piggyback:550] Out: 2159 [Urine:25] Intake/Output from this shift: Total I/O In: 465 [IV Piggyback:465] Out: 608 [Other:608] Vent settings for last 24 hours:    Labs:  Recent Labs  09/29/14 0437  09/30/14 0436 09/30/14 0836 09/30/14 1345  10/01/14 0611 10/01/14 1211 10/01/14 1606  WBC 21.4*  --   --  32.7*  --   --  35.4*  --   --   HGB 10.9*  --   --  10.0*  --   --  8.8*  --   --   HCT 33.5*  --   --  31.3*  --   --  27.4*  --   --   PLT 204  --   --  149*  --   --  105*  --   --   APTT 54*  --  57*  --   --   --  61*  --   --   CREATININE  --   < >  --  1.11* 1.01*  < > 1.15* 1.14* 1.16*  MG  --   --  1.6*  --  1.8  --  1.8  --   --   PHOS  --   < >  --  2.1* 1.9*  < > 2.1* 2.3* 2.7  ALBUMIN  --   < >  --  <1.0* 1.1*  < > 1.5* 1.7* 1.7*  PROT  --   --   --   --   --   --  4.4*  --   --   AST  --   --   --   --   --   --  84*  --   --   ALT  --   --   --   --   --   --  25  --   --   ALKPHOS  --   --   --   --   --   --  520*  --   --   BILITOT  --   --   --   --   --   --  7.0*  --   --   BILIDIR  --   --   --   --   --   --  5.2*  --   --   IBILI  --   --   --   --   --   --  1.8*  --   --   < > = values in this interval not displayed. Estimated Creatinine Clearance: 52.7 mL/min (by C-G formula based on Cr of 1.16).  No results for input(s): GLUCAP in the last 72 hours.  Microbiology: Recent Results (from the past 720 hour(s))  Blood culture (routine x 2)      Status: None   Collection Time: 09/13/2014 12:45 AM  Result Value Ref Range Status   Specimen Description BLOOD  Final   Special Requests NONE  Final  Culture  Setup Time   Final    GRAM POSITIVE COCCI IN CHAINS IN BOTH AEROBIC AND ANAEROBIC BOTTLES CRITICAL RESULT CALLED TO, READ BACK BY AND VERIFIED WITH: PAM CRAWFORD AT 1406 09/19/2014 DV    Culture STREPTOCOCCUS SPECIES  Final   Report Status 10/01/2014 FINAL  Final   Organism ID, Bacteria STREPTOCOCCUS SPECIES  Final      Susceptibility   Streptococcus species - MIC (ETEST)*    ERYTHROMYCIN Value in next row Resistant      RESISTANT6    PENICILLIN Value in next row Intermediate      INTERMEDIATE0.25    CEFTRIAXONE Value in next row Sensitive      SENSITIVE0.047    VANCOMYCIN Value in next row Sensitive      SENSITIVE1.0    LEVOFLOXACIN Value in next row Sensitive      SENSITIVE1.0    * STREPTOCOCCUS SPECIES  Blood culture (routine x 2)     Status: None   Collection Time: 09/27/2014 12:45 AM  Result Value Ref Range Status   Specimen Description BLOOD  Final   Special Requests NONE  Final   Culture  Setup Time   Final    GRAM POSITIVE COCCI IN CHAINS IN BOTH AEROBIC AND ANAEROBIC BOTTLES CRITICAL RESULT CALLED TO, READ BACK BY AND VERIFIED WITH: PAM CRAWFORD AT 1406 09/13/2014 DV    Culture   Final    STREPTOCOCCUS SPECIES REFER TO ACCESSION [id number] COLLECTED ON 09/30/2014 AT 0045 FOR SENSITIVITIES    Report Status 09/30/2014 FINAL  Final    Medications:  Scheduled:  . albumin human  25 g Intravenous Q6H  . antiseptic oral rinse  7 mL Mouth Rinse q12n4p  . cefTRIAXone (ROCEPHIN)  IV  2 g Intravenous Q24H  . chlorhexidine  15 mL Mouth Rinse BID  . clindamycin (CLEOCIN) IV  600 mg Intravenous 3 times per day  . feeding supplement (RESOURCE BREEZE)  1 Container Oral BID BM  . heparin  5,000 Units Subcutaneous 3 times per day  . hydrocortisone sod succinate (SOLU-CORTEF) inj  50 mg Intravenous Q6H  . lactulose  20 g Oral BID   . midodrine  10 mg Oral TID   Infusions:  . norepinephrine (LEVOPHED) Adult infusion 40 mcg/min (10/01/14 1719)  . phenylephrine (NEO-SYNEPHRINE) Adult infusion 12 mcg/min (10/01/14 1719)  . pureflow 1,000 mL/hr at 10/01/14 0830  . vasopressin (PITRESSIN) infusion - *FOR SHOCK* 0.03 Units/min (10/01/14 1719)    Assessment: 64 yo female admitted to ICU for septic shock requiring norepinephrine and vasopressin. Patient now on CRRT. Ceftriaxone/Clinda.  Plan:   1. Antibiotics: Patient transitioned off vancomycin to ceftriaxone 2g IV Q24hr and Clindamycin 600mg  IV Q8hr. Will continue to monitor cultures, WBC and temperatures. Patient is receiving stress dose steroids which is likely contributing to patient's leukocytosis.     2. Electrolytes: Phos= 2.1. Potassium Phosphate 15 mmol x 1 ordered. Will recheck with am labs.   Pharmacy will continue to monitor and adjust per consult.    Mallary Kreger A 10/01/2014,5:26 PM

## 2014-10-01 NOTE — Consult Note (Signed)
GI Inpatient Consult Note  Reason for Consult: elev bilirubin, alkp   Attending Requesting Consult:  Sudini  History of Present Illness: [name] is a 64 y.o. female with past medical history notable for severe obesity, DM 2, amyloidosis, chronic knee disease who initially presented to the hospital for evaluation of altered mental status and fall. She was admitted to the hospital for further management. In the course of this workup it was noted that she appeared to be septic due to a cellulitis. She also has a elevated total bilirubin and elevated alkaline phosphatase. GI is consulted for further recommendations.  At this time she is alert and oriented 0 and only withdraws to pain. Therefore this history is per the chart. This hospitalization includes antibiotics initially Zosyn and then a switch to ceftriaxone. She had a CT abdomen and pelvis today which showed abdominal fluid and anasarca but otherwise no concerning abdominal features. Her liver was unremarkable as was her biliary system.  Noted appear she was recently diagnosed with amyloidosis. She was planned to start on chemotherapy but this was not started due to her other medical problems.  Past Medical History:  Past Medical History  Diagnosis Date  . HTN (hypertension)   . Diabetes mellitus type II   . Obesity   . Arthritis   . Chronic kidney disease   . Amyloidosis     Problem List: Patient Active Problem List   Diagnosis Date Noted  . Septic shock 09/12/2014  . Encounter for Medicare annual wellness exam 09/20/2014  . Amyloidosis   . Nephrotic syndrome 06/28/2014  . Renal insufficiency 06/28/2014  . Acute renal failure 06/26/2014  . Dehydration 06/26/2014  . Orthostatic hypotension 06/26/2014  . Positive D dimer 06/26/2014  . Bilateral lower extremity edema 06/26/2014  . Hypokalemia 06/26/2014  . Dyspnea 06/26/2014  . Pedal edema 06/07/2014  . Tinea pedis 06/07/2014  . Shortness of breath 01/14/2014  .  Right arm pain 01/11/2014  . Exercise intolerance 01/11/2014  . S/P right TKA 09/28/2013  . Deformity of toenail 06/14/2013  . Other screening mammogram 12/18/2010  . Routine general medical examination at a health care facility 12/08/2010  . Osteoarthritis of knee 09/13/2010  . Knee pain, bilateral 09/11/2010  . Obesity   . Diabetes type 2, controlled 02/26/2010  . Essential hypertension 08/29/2009    Past Surgical History: Past Surgical History  Procedure Laterality Date  . Total abdominal hysterectomy  10/1999    fibroids  . Combined abdominoplasty and liposuction  07/2000  . Total knee arthroplasty Right 09/28/2013    Procedure: RIGHT TOTAL KNEE ARTHROPLASTY;  Surgeon: Mauri Pole, MD;  Location: [facility];  Service: Orthopedics;  Laterality: Right;    Allergies: No Known Allergies  Home Medications: Prescriptions prior to admission  Medication Sig Dispense Refill Last Dose  . metolazone (ZAROXOLYN) 5 MG tablet Take 5 mg by mouth daily.   unknown  . midodrine (PROAMATINE) 10 MG tablet Take 5 mg by mouth 3 (three) times daily.    unknown  . polyethylene glycol powder (GLYCOLAX/MIRALAX) powder Take 17 g by mouth daily as needed for moderate constipation. 3350 g 1 unknown  . potassium chloride SA (K-DUR,KLOR-CON) 20 MEQ tablet Take 1 tablet (20 mEq total) by mouth daily. 90 tablet 3 unknown  . torsemide (DEMADEX) 20 MG tablet Take 20 mg by mouth 2 (two) times daily as needed (swelling).    unknown   Home medication reconciliation was completed with the patient.   Scheduled Inpatient  Medications:   . albumin human  25 g Intravenous Q6H  . antiseptic oral rinse  7 mL Mouth Rinse q12n4p  . cefTRIAXone (ROCEPHIN)  IV  2 g Intravenous Q24H  . chlorhexidine  15 mL Mouth Rinse BID  . clindamycin (CLEOCIN) IV  600 mg Intravenous 3 times per day  . feeding supplement (RESOURCE BREEZE)  1 Container Oral BID BM  . heparin  5,000 Units Subcutaneous 3 times per day  . hydrocortisone sod  succinate (SOLU-CORTEF) inj  50 mg Intravenous Q6H  . midodrine  10 mg Oral TID  . potassium phosphate IVPB (mmol)  15 mmol Intravenous Once    Continuous Inpatient Infusions:   . norepinephrine (LEVOPHED) Adult infusion 40 mcg/min (10/01/14 1335)  . phenylephrine (NEO-SYNEPHRINE) Adult infusion 18 mcg/min (10/01/14 1418)  . pureflow 1,000 mL/hr at 10/01/14 0830  . vasopressin (PITRESSIN) infusion - *FOR SHOCK* 0.03 Units/min (10/01/14 1335)    PRN Inpatient Medications:  acetaminophen **OR** acetaminophen, albuterol, heparin, morphine injection, ondansetron **OR** ondansetron (ZOFRAN) IV, polyethylene glycol powder  Family History: family history includes Arthritis in her mother; Cancer in her mother; Diabetes in her mother; Hypertension in her father and mother.   Social History:   reports that she has never smoked. She has never used smokeless tobacco. She reports that she does not drink alcohol or use illicit drugs.   Review of Systems:  Cannot obtain, obtunded.   Physical Examination: BP 104/58 mmHg  Pulse 106  Temp(Src) 98.2 F (36.8 C) (Oral)  Resp 19  Ht 5\' 1"  (1.549 m)  Wt 222 lb 14.2 oz (101.1 kg)  BMI 42.14 kg/m2  SpO2 95% Gen: NAD, alert and oriented x 0, obtunded,  Morbidly obese HEENT: [place]/AT, cannot assess ocular movements.  Neck: supple, no JVD or thyromegaly Chest: CTA bilaterally, no wheezes, crackles, or other adventitious sounds CV: RRR, no m/g/c/r Abd: soft, + diffuse TTP,  Bowel sounds are decreased and high pitched.  no guarding, ridigity Ext: 2+ edema bilat, + catheter in groin.  Lymph: no LAD  Data: Lab Results  Component Value Date   WBC 35.4* 10/01/2014   HGB 8.8* 10/01/2014   HCT 27.4* 10/01/2014   MCV 88.6 10/01/2014   PLT 105* 10/01/2014    Recent Labs Lab 09/29/14 0437 09/30/14 0836 10/01/14 0611  HGB 10.9* 10.0* 8.8*   Lab Results  Component Value Date   NA 133* 10/01/2014   K 4.6 10/01/2014   CL 100* 10/01/2014   CO2 25  10/01/2014   BUN 20 10/01/2014   CREATININE 1.14* 10/01/2014   Lab Results  Component Value Date   ALT 25 10/01/2014   AST 84* 10/01/2014   ALKPHOS 520* 10/01/2014   BILITOT 7.0* 10/01/2014    Recent Labs Lab 09/17/2014 0045  10/01/14 0611  APTT  --   < > 61*  INR 2.58  --   --   < > = values in this interval not displayed.   Assessment/Plan: [name] is a 64 y.o. female with above complicated past medical history who initially presented for evaluation of altered mental status fall hypotension. GI consultedor elevated total bilirubin alkaline phosphatase. She is critically ill at this time with sepsis and has a poor mental status. The CT scan would argue against an obstructive jaundice cause of the elevated bilirubin and alkaline phosphatase. She likely has cholestasis due to sepsis and to medications. However her elevated INR of 2.6 on presentation is also concerning for liver failure which may be in  the setting of chronic liver disease with worsening in the setting of acute illness.  Recommendations: #1: Follow daily liver enzymes. #2: Follow daily INR  #3: Treat underlying sepsis as you are doing #4: Check an ammonia level #5: Start lactulose titrate to 2-3 stools daily #6: Consider palliative care consult. She would not be a candidate for liver transplant.  Thank you for the consult. Please call with questions or concerns.  [name], Grace Blight, MD

## 2014-10-01 NOTE — Progress Notes (Signed)
Pt remains on CRRT, tolerating ultrafiltration of 100 ml/hr.  Pt remains very lethargic, arouses to voice/pain at times.  Moans, pt kept npo.  NSR/sinus tach, lungs clear with wheezing at times.  Abdomen tender, CT abdomen/pelvis obtained.  Minimal urine output.  Remains on levophed, vasopressin, and neosynephrine to maintain map >60.  Titrating down on neosynephrine.

## 2014-10-01 NOTE — Plan of Care (Signed)
Problem: Consults Goal: Nutrition Consult-if indicated Outcome: Not Met (add Reason) Pt remains very lethargic, unable to tolerate PO  Problem: Phase I Progression Outcomes Goal: Uremic symptoms managed Outcome: Not Met (add Reason) Remains on CRRT, oliguric Goal: OOB as tolerated unless otherwise ordered Outcome: Not Met (add Reason) Pt lethargic, hemodyamically unstable, on 3 vasopressors Goal: Pt./family verbalizes plan of care Outcome: Progressing Family verbalizes some understanding of pt critical state and plan for now

## 2014-10-01 NOTE — Progress Notes (Signed)
Discussed with Dr. Grayland Ormond regarding options for treating amyloidosis. Unfortunately chemo cannot be given at this time. Will check IgG and replace if low. May not help but need to try.  Patient critically ill. High risk for cardiac arrest and death.

## 2014-10-01 NOTE — Progress Notes (Addendum)
[facility] [place] NAME: [name]    MR#:  [id number]  DATE OF BIRTH:  1951-02-08  SUBJECTIVE:  CHIEF COMPLAINT:   Chief Complaint  Patient presents with  . Altered Mental Status    Critically ill. Opens eyes but not verbal. On 3 pressors. Not eating. Anuric. Getting CRRT.  REVIEW OF SYSTEMS:    ROS  Unable to give history due to encephalopathy     DRUG ALLERGIES:  No Known Allergies  VITALS:  Blood pressure 104/67, pulse 101, temperature 98.3 F (36.8 C), temperature source Oral, resp. rate 18, height 5\' 1"  (1.549 m), weight 101.1 kg (222 lb 14.2 oz), SpO2 100 %.  PHYSICAL EXAMINATION:   Physical Exam  GENERAL:  64 y.o.-year-old patient lying in the bed drowzy. EYES: Pupils equal, round, reactive to light and accommodation. No scleral icterus. Extraocular muscles intact. HEENT: Head atraumatic, normocephalic. Oropharynx and nasopharynx clear. NECK:  Supple, no jugular venous distention. No thyroid enlargement, no tenderness. Right IJ TL. LUNGS: Normal breath sounds bilaterally, no wheezing, rales, rhonchi. No use of accessory muscles of respiration. CARDIOVASCULAR: S1, S2 normal. No murmurs, rubs, or gallops. ABDOMEN: Soft, nontender, nondistended. Bowel sounds present. No organomegaly or mass. EXTREMITIES: No cyanosis, clubbing. Anasarca. Open superficial ulcer right lateral thigh. NEUROLOGIC: Drowzy.lethargic.  PSYCHIATRIC: The patient is drowzy. SKIN:  Erythema with warmth right abdominal wall, groin,right lateral thigh. Superficial ulcer right thigh.  LABORATORY PANEL:   CBC  Recent Labs Lab 10/01/14 0611  WBC 35.4*  HGB 8.8*  HCT 27.4*  PLT 105*   ------------------------------------------------------------------------------------------------------------------  Chemistries   Recent Labs Lab 09/09/2014 1403  10/01/14 0611  NA 134*  < > 132*  K 2.8*  < > 4.2  CL 97*  < > 99*  CO2 21*  <  > 25  GLUCOSE 86  < > 122*  BUN 47*  < > 18  CREATININE 2.31*  < > 1.15*  CALCIUM 7.1*  < > 8.0*  MG  --   < > 1.8  AST 75*  --   --   ALT 19  --   --   ALKPHOS 656*  --   --   BILITOT 5.8*  --   --   < > = values in this interval not displayed. ------------------------------------------------------------------------------------------------------------------  Cardiac Enzymes No results for input(s): TROPONINI in the last 168 hours. ------------------------------------------------------------------------------------------------------------------  RADIOLOGY:  No results found.   ASSESSMENT AND PLAN:   64 year old African American female history of nephrotic syndrome, amyloidosis, essential hypertension presenting with altered mental status/confusion after apparently falling out of bed. Admitted for septic shock  * Septic shock with right flank/thigh cellulitis with streptococcus bacteremia On broad spectrum abx. Changed to Ceftriaxone and clindamycin. No improvement inspite of aggressive care. Appreciate Dr. Blane Ohara input.  Echo-No vegetations. On solucortef  * Acute renal failure over CKD3 due to ATN. From septic shock. On CRRT. Appreciate nephrology help. Severe hypoalbuminemia. Getting IV albumin. Will likely progress to ESRD.  * Acute encephalopathy - Due to acute illness. CT head nothing acute.  * Anemia of chronic disease- stable  * Type 2 diabetes, uncomplicated: Hold oral agents, at insulin slide scale every 6 hours Accu-Chek  * Essential hypertension: Held all meds  * Venous thromboembolism prophylactic: Heparin subcutaneous  * Nutrition Will need to discuss with family regarding Tube feeds.   All the records are reviewed and case discussed with Care Management/Social Workerr. Management plans discussed with the patient,  family and they are in agreement.  CODE STATUS: FULL CODE  Discussed with son at length. Explained poor prognosis and no good  response to aggressive treatment. Also discussed with Dr. Phifer/ Dr. Candiss Norse  DVT Prophylaxis: Heparin  TOTAL CRITICAL CARE TIME TAKING CARE OF THIS PATIENT: 40 minutes.   POSSIBLE D/C IN 3-4 DAYS, DEPENDING ON CLINICAL CONDITION.   Hillary Bow R M.D on 10/01/2014 at 8:09 AM  Between 7am to 6pm - Pager - [phone]  After 6pm go to [email or web address] - password [email or web address]  Office  [phone]  CC: Primary care physician; [name], MD     Significant bilirubinemia with mostly direct. Will get Ct abdomen for any abdominal source of infection and also consult GI.

## 2014-10-01 NOTE — Plan of Care (Signed)
Problem: Phase II Progression Outcomes Goal: Urinary output increased Outcome: Not Met (add Reason) Oliquric, remains on CRRT Goal: Tol increased activity, up in chair for at least 4 hrs/HD pt Outcome: Not Applicable Date Met:  22/44/97 Pt on CRRT, lethargic and hemodyamically unstable Goal: Tolerating diet Outcome: Not Met (add Reason) NPO, lethargic and unable to tolerate PO Goal: Hemodynamically stable Outcome: Progressing titrating down on neosynephrine

## 2014-10-01 NOTE — Progress Notes (Signed)
Patient developed expiratory wheezes.  Dr Reece Levy notified.  Order received for breathing treatment prn. Cardiopulmonary notified.

## 2014-10-02 ENCOUNTER — Inpatient Hospital Stay: Payer: Medicare Other

## 2014-10-02 LAB — CBC WITH DIFFERENTIAL/PLATELET
Band Neutrophils: 5 % (ref 0–10)
Basophils Absolute: 0 10*3/uL (ref 0.0–0.1)
Basophils Relative: 0 % (ref 0–1)
Blasts: 0 %
EOS PCT: 0 % (ref 0–5)
Eosinophils Absolute: 0 10*3/uL (ref 0.0–0.7)
HCT: 18.6 % — ABNORMAL LOW (ref 35.0–47.0)
Hemoglobin: 5.9 g/dL — ABNORMAL LOW (ref 12.0–16.0)
Lymphocytes Relative: 6 % — ABNORMAL LOW (ref 12–46)
Lymphs Abs: 1.5 10*3/uL (ref 0.7–4.0)
MCH: 28 pg (ref 26.0–34.0)
MCHC: 31.6 g/dL — AB (ref 32.0–36.0)
MCV: 88.6 fL (ref 80.0–100.0)
METAMYELOCYTES PCT: 0 %
MONO ABS: 2 10*3/uL — AB (ref 0.1–1.0)
Monocytes Relative: 8 % (ref 3–12)
Myelocytes: 0 %
NEUTROS PCT: 81 % — AB (ref 43–77)
Neutro Abs: 21.1 10*3/uL — ABNORMAL HIGH (ref 1.7–7.7)
Other: 0 %
PLATELETS: 68 10*3/uL — AB (ref 150–440)
Promyelocytes Absolute: 0 %
RBC: 2.1 MIL/uL — AB (ref 3.80–5.20)
RDW: 20.4 % — ABNORMAL HIGH (ref 11.5–14.5)
WBC: 24.6 10*3/uL — AB (ref 3.6–11.0)
nRBC: 3 /100 WBC — ABNORMAL HIGH

## 2014-10-02 LAB — PREPARE RBC (CROSSMATCH)

## 2014-10-02 LAB — COMPREHENSIVE METABOLIC PANEL
ALT: 29 U/L (ref 14–54)
AST: 94 U/L — ABNORMAL HIGH (ref 15–41)
Albumin: 2.8 g/dL — ABNORMAL LOW (ref 3.5–5.0)
Alkaline Phosphatase: 319 U/L — ABNORMAL HIGH (ref 38–126)
Anion gap: 9 (ref 5–15)
BILIRUBIN TOTAL: 8.9 mg/dL — AB (ref 0.3–1.2)
BUN: 27 mg/dL — AB (ref 6–20)
CALCIUM: 8.3 mg/dL — AB (ref 8.9–10.3)
CHLORIDE: 100 mmol/L — AB (ref 101–111)
CO2: 26 mmol/L (ref 22–32)
Creatinine, Ser: 1.14 mg/dL — ABNORMAL HIGH (ref 0.44–1.00)
GFR calc Af Amer: 57 mL/min — ABNORMAL LOW (ref >60)
GFR calc non Af Amer: 49 mL/min — ABNORMAL LOW (ref >60)
Glucose, Bld: 176 mg/dL — ABNORMAL HIGH (ref 65–99)
Potassium: 4.7 mmol/L (ref 3.5–5.1)
SODIUM: 135 mmol/L (ref 135–145)
Total Protein: 4.8 g/dL — ABNORMAL LOW (ref 6.5–8.1)

## 2014-10-02 LAB — RENAL FUNCTION PANEL
ALBUMIN: 2.5 g/dL — AB (ref 3.5–5.0)
ALBUMIN: 3.1 g/dL — AB (ref 3.5–5.0)
Anion gap: 6 (ref 5–15)
Anion gap: 8 (ref 5–15)
BUN: 25 mg/dL — ABNORMAL HIGH (ref 6–20)
BUN: 27 mg/dL — ABNORMAL HIGH (ref 6–20)
CALCIUM: 8.2 mg/dL — AB (ref 8.9–10.3)
CALCIUM: 8.3 mg/dL — AB (ref 8.9–10.3)
CO2: 25 mmol/L (ref 22–32)
CO2: 26 mmol/L (ref 22–32)
CREATININE: 1.16 mg/dL — AB (ref 0.44–1.00)
CREATININE: 1.19 mg/dL — AB (ref 0.44–1.00)
Chloride: 102 mmol/L (ref 101–111)
Chloride: 101 mmol/L (ref 101–111)
GFR calc Af Amer: 56 mL/min — ABNORMAL LOW (ref >60)
GFR, EST AFRICAN AMERICAN: 54 mL/min — AB (ref >60)
GFR, EST NON AFRICAN AMERICAN: 48 mL/min — AB (ref >60)
GFR, EST NON AFRICAN AMERICAN: 47 mL/min — AB (ref >60)
Glucose, Bld: 184 mg/dL — ABNORMAL HIGH (ref 65–99)
Glucose, Bld: 181 mg/dL — ABNORMAL HIGH (ref 65–99)
PHOSPHORUS: 3.1 mg/dL (ref 2.5–4.6)
POTASSIUM: 4.5 mmol/L (ref 3.5–5.1)
Phosphorus: 2.3 mg/dL — ABNORMAL LOW (ref 2.5–4.6)
Potassium: 5.1 mmol/L (ref 3.5–5.1)
SODIUM: 135 mmol/L (ref 135–145)
Sodium: 133 mmol/L — ABNORMAL LOW (ref 135–145)

## 2014-10-02 LAB — CBC
HEMATOCRIT: 19.1 % — AB (ref 35.0–47.0)
HEMOGLOBIN: 6.1 g/dL — AB (ref 12.0–16.0)
MCH: 28.5 pg (ref 26.0–34.0)
MCHC: 32.1 g/dL (ref 32.0–36.0)
MCV: 88.7 fL (ref 80.0–100.0)
PLATELETS: 74 10*3/uL — AB (ref 150–440)
RBC: 2.16 MIL/uL — AB (ref 3.80–5.20)
RDW: 19.8 % — ABNORMAL HIGH (ref 11.5–14.5)
WBC: 26 10*3/uL — ABNORMAL HIGH (ref 3.6–11.0)

## 2014-10-02 LAB — FIBRIN DERIVATIVES D-DIMER (ARMC ONLY): FIBRIN DERIVATIVES D-DIMER (ARMC): 4515 — AB (ref 0–499)

## 2014-10-02 LAB — ABO/RH: ABO/RH(D): A POS

## 2014-10-02 LAB — IGG: IgG (Immunoglobin G), Serum: 458 mg/dL — ABNORMAL LOW (ref 700–1600)

## 2014-10-02 LAB — MAGNESIUM: Magnesium: 1.7 mg/dL (ref 1.7–2.4)

## 2014-10-02 LAB — FIBRINOGEN: Fibrinogen: 516 mg/dL — ABNORMAL HIGH (ref 210–470)

## 2014-10-02 LAB — APTT: APTT: 57 s — AB (ref 24–36)

## 2014-10-02 LAB — GLUCOSE, CAPILLARY: GLUCOSE-CAPILLARY: 157 mg/dL — AB (ref 65–99)

## 2014-10-02 LAB — PROTIME-INR
INR: 5.86 — AB
INR: 5.87 — AB
PROTHROMBIN TIME: 52.2 s — AB (ref 11.4–15.0)
PROTHROMBIN TIME: 52.4 s — AB (ref 11.4–15.0)

## 2014-10-02 LAB — LACTATE DEHYDROGENASE: LDH: 312 U/L — ABNORMAL HIGH (ref 98–192)

## 2014-10-02 LAB — FIBRIN DEGRADATION PROD.(ARMC ONLY): Fibrin Degradation Prod.: >40 ug/mL — AB (ref <10)

## 2014-10-02 MED ORDER — INSULIN ASPART 100 UNIT/ML ~~LOC~~ SOLN
0.0000 [IU] | Freq: Four times a day (QID) | SUBCUTANEOUS | Status: DC
  Administered 2014-10-03 (×3): 2 [IU] via SUBCUTANEOUS
  Administered 2014-10-03: 1 [IU] via SUBCUTANEOUS
  Administered 2014-10-04 (×2): 3 [IU] via SUBCUTANEOUS
  Filled 2014-10-02 (×2): qty 2
  Filled 2014-10-02: qty 1
  Filled 2014-10-02: qty 2
  Filled 2014-10-02 (×2): qty 3

## 2014-10-02 MED ORDER — SODIUM CHLORIDE 0.9 % IV SOLN
Freq: Once | INTRAVENOUS | Status: AC
  Administered 2014-10-02: 12:00:00 via INTRAVENOUS

## 2014-10-02 MED ORDER — CLINIMIX E/DEXTROSE (5/20) 5 % IV SOLN
INTRAVENOUS | Status: DC
Start: 2014-10-02 — End: 2014-10-03
  Administered 2014-10-03: 06:00:00 via INTRAVENOUS
  Filled 2014-10-02: qty 600

## 2014-10-02 MED ORDER — PUREFLOW DIALYSIS SOLUTION
INTRAVENOUS | Status: DC
  Administered 2014-10-02 (×2): via INTRAVENOUS_CENTRAL
  Filled 2014-10-02: qty 5000

## 2014-10-02 MED ORDER — DEXTROSE 5 % IV SOLN
10.0000 mg | Freq: Once | INTRAVENOUS | Status: AC
  Administered 2014-10-02: 10 mg via INTRAVENOUS
  Filled 2014-10-02: qty 1

## 2014-10-02 MED ORDER — POTASSIUM PHOSPHATES 15 MMOLE/5ML IV SOLN
15.0000 mmol | Freq: Once | INTRAVENOUS | Status: AC
  Administered 2014-10-02: 15 mmol via INTRAVENOUS
  Filled 2014-10-02: qty 5

## 2014-10-02 NOTE — Progress Notes (Signed)
Opens eyes and occassionally says one word clearly. More often is nonverbal and  makes grunting noises. Upper extremities move purposefully but does not follow commands. Has had one of 2 units PRBC's.  And first unit FFP infusing. . Sats stable on 2 L/[place].   Lung sounds have progressively changed with increased wheezing and fine crackles.  Nausea x1 with very scant amount black liquid emesis. Zofran given. CCRT bath changed to 2 K+due to  K=+5.1 .  No urine output. No port available to start TPN until blood  Products are completed.  Family members here to see all day long. All remaining very hopeful and are aware of 2 organ failure- liver and kidney.

## 2014-10-02 NOTE — Plan of Care (Signed)
Problem: Phase I Progression Outcomes Goal: Hemodynamically stable Outcome: Progressing BP iproving some with blood products . Weaned off neo. Weaning levophed slowly.  Vasopressin continues Goal: Pt./family verbalizes plan of care Outcome: Progressing Family very hopeful and believe they see improvements in her LOC and slight decrease in edema  Problem: Phase II Progression Outcomes Goal: Urinary output increased Outcome: Not Met (add Reason) Anuric

## 2014-10-02 NOTE — Progress Notes (Signed)
Patient placed on Bipap @ 40%

## 2014-10-02 NOTE — Progress Notes (Signed)
GI Inpatient Follow-up Note  Patient Identification: [name] is a 64 y.o. female with multi organ failure, sepsis, likely liver failure  Subjective:  Very little has changed since yesterday. They're attempting to wean her off of multiple pressors. She is continuing continuous dialysis. INR was close to 6 twice. She is mumbling some words but continues to have very altered mental status. Unable to give review of systems.  Scheduled Inpatient Medications:  . albumin human  25 g Intravenous Q6H  . antiseptic oral rinse  7 mL Mouth Rinse q12n4p  . cefTRIAXone (ROCEPHIN)  IV  2 g Intravenous Q24H  . chlorhexidine  15 mL Mouth Rinse BID  . clindamycin (CLEOCIN) IV  600 mg Intravenous 3 times per day  . hydrocortisone sod succinate (SOLU-CORTEF) inj  50 mg Intravenous Q6H  . insulin aspart  0-9 Units Subcutaneous 4 times per day  . lactulose  20 g Oral BID  . midodrine  10 mg Oral TID    Continuous Inpatient Infusions:   . Marland KitchenTPN (CLINIMIX-E) Adult    . norepinephrine (LEVOPHED) Adult infusion 29.973 mcg/min (10/02/14 1000)  . phenylephrine (NEO-SYNEPHRINE) Adult infusion Stopped (10/02/14 1100)  . pureflow 1,000 mL/hr at 10/02/14 0020  . vasopressin (PITRESSIN) infusion - *FOR SHOCK* 0.03 Units/min (10/02/14 1400)    PRN Inpatient Medications:  acetaminophen **OR** acetaminophen, albuterol, heparin, morphine injection, ondansetron **OR** ondansetron (ZOFRAN) IV, polyethylene glycol powder    Physical Examination: BP 91/59 mmHg  Pulse 99  Temp(Src) 97.3 F (36.3 C) (Axillary)  Resp 19  Ht 5\' 1"  (1.549 m)  Wt 222 lb 14.2 oz (101.1 kg)  BMI 42.14 kg/m2  SpO2 92% Gen: NAD, alert and oriented x 0,  Neck: supple, no JVD or thyromegaly Chest: coarse bilat CV: RRR, no m/g/c/r Abd: distended, nt, decreased bowel sounds Ext: trace edema, well perfused with 2+ pulses,   Data: Lab Results  Component Value Date   WBC 24.6* 10/02/2014   HGB 5.9* 10/02/2014   HCT 18.6*  10/02/2014   MCV 88.6 10/02/2014   PLT 68* 10/02/2014    Recent Labs Lab 10/01/14 0611 10/02/14 0633 10/02/14 1030  HGB 8.8* 6.1* 5.9*   Lab Results  Component Value Date   NA 135 10/02/2014   K 4.7 10/02/2014   CL 100* 10/02/2014   CO2 26 10/02/2014   BUN 27* 10/02/2014   CREATININE 1.14* 10/02/2014   Lab Results  Component Value Date   ALT 29 10/02/2014   AST 94* 10/02/2014   ALKPHOS 319* 10/02/2014   BILITOT 8.9* 10/02/2014    Recent Labs Lab 10/02/14 0151  APTT 57*  INR 5.87*   Assessment/Plan: [name] is a 64 y.o. female multi organ failure, sepsis, likely liver failure.  The INR of 6 has a very poor prognosis. She is in multiorgan failure. Shee is not a transplant candidate.  Recommendations: - Palliative care consult - Continue to follow daily INR and liver enzymes -Give FFP only if evidence of active bleeding -cont Lactulose for hepatic encephalopathy -Continue to treat underlying source of sepsis, supportive care though overall prognosis is poor  Please call with questions or concerns.  [name], Gina Blight, MD

## 2014-10-02 NOTE — Progress Notes (Signed)
Subjective:  Remains critically ill at this point in time. Still on 3 pressors. Continues to progress well on CRRT. Receiving albumen as well. Urine output only 10 cc over the past 24 hours.  Objective:  Vital signs in last 24 hours:  Temp:  [97.8 F (36.6 C)-98.5 F (36.9 C)] 98.4 F (36.9 C) (05/29 0727) Pulse Rate:  [28-108] 102 (05/29 1000) Resp:  [15-26] 19 (05/29 1000) BP: (82-110)/(48-77) 94/63 mmHg (05/29 1000) SpO2:  [88 %-100 %] 100 % (05/29 1000) Weight:  [101.1 kg (222 lb 14.2 oz)] 101.1 kg (222 lb 14.2 oz) (05/29 0421)  Weight change: 0 kg (0 lb) Filed Weights   09/30/14 0600 10/01/14 0500 10/02/14 0421  Weight: 106.414 kg (234 lb 9.6 oz) 101.1 kg (222 lb 14.2 oz) 101.1 kg (222 lb 14.2 oz)    Intake/Output: I/O last 3 completed shifts: In: 2082.3 [I.V.:1267.3; IV Piggyback:815] Out: 3382 [Urine:10; Other:3221]     Physical Exam: General: NAD, laying in bed  HEENT Eyes closed, moist mucus membranes  Neck supple  Pulm/lungs Decreased breath sounds at bases, normal resp effort  CVS/Heart S1S2 no rubs  Abdomen:  Soft, non tender, BS present  Extremities: +++ anasarca  Neurologic: arousable  Skin: + edema, rt groin cellulitis   Access: Temp cath- rt femoral       Basic Metabolic Panel:  Recent Labs Lab 09/29/14 0409  09/30/14 0436  09/30/14 1345  10/01/14 0611 10/01/14 1211 10/01/14 1606 10/01/14 2303 10/02/14 0151 10/02/14 0633  NA 133*  < >  --   < > 133*  < > 132* 133* 137 135  --  133*  K 4.0  < >  --   < > 4.3  < > 4.2 4.6 4.6 4.5  --  4.5  CL 101  < >  --   < > 101  < > 99* 100* 99* 101  --  102  CO2 24  < >  --   < > 25  < > 25 25 26 25   --  25  GLUCOSE 82  < >  --   < > 111*  < > 122* 142* 156* 163*  --  184*  BUN 31*  < >  --   < > 16  < > 18 20 21* 22*  --  25*  CREATININE 1.53*  < >  --   < > 1.01*  < > 1.15* 1.14* 1.16* 1.21*  --  1.16*  CALCIUM 7.6*  < >  --   < > 7.9*  < > 8.0* 8.0* 8.3* 8.2*  --  8.2*  MG 1.4*  --  1.6*  --   1.8  --  1.8  --   --   --  1.7  --   PHOS 3.7  < >  --   < > 1.9*  < > 2.1* 2.3* 2.7 2.5  --  2.3*  < > = values in this interval not displayed.   CBC:  Recent Labs Lab 09/08/2014 0045 09/29/14 0437 09/30/14 0836 10/01/14 0611  WBC 5.3 21.4* 32.7* 35.4*  HGB 11.5* 10.9* 10.0* 8.8*  HCT 35.5 33.5* 31.3* 27.4*  MCV 88.0 89.1 88.9 88.6  PLT 311 204 149* 105*      Microbiology: Results for orders placed or performed during the hospital encounter of 09/04/2014  Blood culture (routine x 2)     Status: None   Collection Time: 09/11/2014 12:45 AM  Result Value Ref Range Status  Specimen Description BLOOD  Final   Special Requests NONE  Final   Culture  Setup Time   Final    GRAM POSITIVE COCCI IN CHAINS IN BOTH AEROBIC AND ANAEROBIC BOTTLES CRITICAL RESULT CALLED TO, READ BACK BY AND VERIFIED WITH: PAM CRAWFORD AT 1406 09/14/2014 DV    Culture STREPTOCOCCUS SPECIES  Final   Report Status 10/01/2014 FINAL  Final   Organism ID, Bacteria STREPTOCOCCUS SPECIES  Final      Susceptibility   Streptococcus species - MIC (ETEST)*    ERYTHROMYCIN Value in next row Resistant      RESISTANT6    PENICILLIN Value in next row Intermediate      INTERMEDIATE0.25    CEFTRIAXONE Value in next row Sensitive      SENSITIVE0.047    VANCOMYCIN Value in next row Sensitive      SENSITIVE1.0    LEVOFLOXACIN Value in next row Sensitive      SENSITIVE1.0    * STREPTOCOCCUS SPECIES  Blood culture (routine x 2)     Status: None   Collection Time: 09/29/2014 12:45 AM  Result Value Ref Range Status   Specimen Description BLOOD  Final   Special Requests NONE  Final   Culture  Setup Time   Final    GRAM POSITIVE COCCI IN CHAINS IN BOTH AEROBIC AND ANAEROBIC BOTTLES CRITICAL RESULT CALLED TO, READ BACK BY AND VERIFIED WITH: PAM CRAWFORD AT 1406 09/13/2014 DV    Culture   Final    STREPTOCOCCUS SPECIES REFER TO ACCESSION [id number] COLLECTED ON 09/21/2014 AT 0045 FOR SENSITIVITIES    Report Status 09/30/2014 FINAL   Final    Coagulation Studies:  Recent Labs  10/01/14 2303 10/02/14 0151  LABPROT 52.2* 52.4*  INR 5.86* 5.87*    Urinalysis: No results for input(s): COLORURINE, LABSPEC, PHURINE, GLUCOSEU, HGBUR, BILIRUBINUR, KETONESUR, PROTEINUR, UROBILINOGEN, NITRITE, LEUKOCYTESUR in the last 72 hours.  Invalid input(s): APPERANCEUR    Imaging: Ct Abdomen Pelvis Wo Contrast  10/01/2014   CLINICAL DATA:  Right flank cellulitis and edema.  EXAM: CT ABDOMEN AND PELVIS WITHOUT CONTRAST  TECHNIQUE: Multidetector CT imaging of the abdomen and pelvis was performed following the standard protocol without IV contrast.  COMPARISON:  08/31/2014  FINDINGS: Lower chest: Small bilateral pleural effusions are noted left greater than right. There is airspace consolidation noted within both lung bases. Patchy ground-glass and airspace opacity within the medial left upper lobe is noted, image number 3/series 4.  Hepatobiliary: There is no suspicious liver abnormality identified. Increased attenuation within the gallbladder may Roux represent stones and/or sludge. No biliary dilatation.  Pancreas: Normal appearance of the pancreas.  Spleen: The spleen is on unremarkable.  Adrenals/Urinary Tract: Normal appearance of the left adrenal gland. Similar appearance of low-attenuation right adrenal gland nodule measuring 2.9 cm, image 30/series 2. The kidneys are both on unremarkable. The urinary bladder is collapsed around a Foley catheter balloon.  Stomach/Bowel: Moderate distension of the stomach containing fluid level. The small bowel loops have a normal course and caliber. Normal appearance of the colon.  Vascular/Lymphatic: There is a right femoral dialysis catheter with tip in the IVC. Abdominal aorta has a normal caliber. No adenopathy.  Reproductive: Previous hysterectomy.  No adnexal mass.  Other: Moderate free fluid is identified within the abdomen and pelvis. There is diffuse body wall edema and skin thickening. No focal  fluid collection identified to suggest abscess.  Musculoskeletal: Degenerative disc disease noted within the lumbar spine.  IMPRESSION: 1. Bilateral pleural effusions,  ascites and anasarca is identified consistent with fluid overload state. 2. Subcutaneous fat stranding and skin thickening may be related to anasarca or cellulitis. 3. No fluid collections identified. 4. Left lung opacities may reflect pneumonia and/r or areas of asymmetric edema.   Electronically Signed   By: Kerby Moors M.D.   On: 10/01/2014 15:17     Medications:   . norepinephrine (LEVOPHED) Adult infusion 29.973 mcg/min (10/02/14 1000)  . phenylephrine (NEO-SYNEPHRINE) Adult infusion 6.133 mcg/min (10/02/14 1000)  . pureflow 1,000 mL/hr at 10/02/14 0020  . vasopressin (PITRESSIN) infusion - *FOR SHOCK* 0.03 Units/min (10/02/14 1000)   . albumin human  25 g Intravenous Q6H  . antiseptic oral rinse  7 mL Mouth Rinse q12n4p  . cefTRIAXone (ROCEPHIN)  IV  2 g Intravenous Q24H  . chlorhexidine  15 mL Mouth Rinse BID  . clindamycin (CLEOCIN) IV  600 mg Intravenous 3 times per day  . feeding supplement (RESOURCE BREEZE)  1 Container Oral BID BM  . hydrocortisone sod succinate (SOLU-CORTEF) inj  50 mg Intravenous Q6H  . lactulose  20 g Oral BID  . midodrine  10 mg Oral TID  . potassium phosphate IVPB (mmol)  15 mmol Intravenous Once   acetaminophen **OR** acetaminophen, albuterol, heparin, morphine injection, ondansetron **OR** ondansetron (ZOFRAN) IV, polyethylene glycol powder  Assessment/ Plan:   64 y.o. African [place] female with diabetes mellitus type 2, hypertension, morbid obesity, osteoarthritis, status post right knee surgery, nephrotic syndrome with edema, hypoalbuminemia, renal insufficiency and proteinuria.  1. ARF with CKD st 3 . Baseline cr 1.3. (08/2014)  2. Nephrotic Syndrome with proteinuria: Renal Amyloidosis.  3. Generalized Edema 4. Hypoalbuminemia 5. Right groin cellulitis /sepsis- strep species   6. Diabetes Mellitus type II 7. Hypotension  Plan: Patient remains critically ill at this point in time. Altered anuria persists at this time. Therefore we will continue CRRT for now. Continue ultrafiltration target of 100 cc per hour. Continue albumen infusion as well. Ultimately treatment guided towards her amyloidosis will be needed. Would also recommend some form of feeding as well. We will need to discuss with hospitalist.  LOS: 4 [name] 5/29/201610:19 AM

## 2014-10-02 NOTE — Progress Notes (Signed)
Dr. Volanda Napoleon informed of resp status. See Assessment.   Chest xray ordered

## 2014-10-02 NOTE — Progress Notes (Signed)
CRITICAL VALUE ALERT  Critical value received:  INR 5.86  Date of notification:  10/02/2014  Time of notification:  0040  Critical value read back:Yes.    Nurse who received alert: Angeline Slim, RN  MD notified (1st page):  0040  Time of first page:  0040  MD notified (2nd page):  Time of second page:  Responding MD:  Patriciaann Clan, MD  Time MD responded:  [phone]

## 2014-10-02 NOTE — Progress Notes (Signed)
[place] NOTE  Pharmacy Consult for Antibiotic Dosing, Electrolyte Management,CRRT   Indication: Septic Shock, ICU Status   No Known Allergies  Patient Measurements: Height: 5\' 1"  (154.9 cm) Weight: 222 lb 14.2 oz (101.1 kg) IBW/kg (Calculated) : 47.8   Vital Signs: Temp: 97.3 F (36.3 C) (05/29 1200) Temp Source: Oral (05/29 1200) BP: 90/60 mmHg (05/29 1200) Pulse Rate: 99 (05/29 1200) Intake/Output from previous day: 05/28 0701 - 05/29 0700 In: 1342.1 [I.V.:627.1; IV Piggyback:715] Out: 2261 [Urine:10] Intake/Output from this shift: Total I/O In: 482.9 [I.V.:168.9; IV Piggyback:314] Out: 387 [Other:387] Vent settings for last 24 hours:    Labs:  Recent Labs  09/30/14 0436  09/30/14 1345  10/01/14 0611  10/01/14 1606 10/01/14 2303 10/02/14 0151 10/02/14 0633 10/02/14 1030  WBC  --   < >  --   --  35.4*  --   --   --   --  26.0* 24.6*  HGB  --   < >  --   --  8.8*  --   --   --   --  6.1* 5.9*  HCT  --   < >  --   --  27.4*  --   --   --   --  19.1* 18.6*  PLT  --   < >  --   --  105*  --   --   --   --  74* 68*  APTT 57*  --   --   --  61*  --   --   --  57*  --   --   INR  --   --   --   --   --   --   --  5.86* 5.87*  --   --   CREATININE  --   < > 1.01*  < > 1.15*  < > 1.16* 1.21*  --  1.16* 1.14*  MG 1.6*  --  1.8  --  1.8  --   --   --  1.7  --   --   PHOS  --   < > 1.9*  < > 2.1*  < > 2.7 2.5  --  2.3*  --   ALBUMIN  --   < > 1.1*  < > 1.5*  < > 1.7* 2.4*  --  2.5* 2.8*  PROT  --   --   --   --  4.4*  --   --   --   --   --  4.8*  AST  --   --   --   --  84*  --   --   --   --   --  94*  ALT  --   --   --   --  25  --   --   --   --   --  29  ALKPHOS  --   --   --   --  520*  --   --   --   --   --  319*  BILITOT  --   --   --   --  7.0*  --   --   --   --   --  8.9*  BILIDIR  --   --   --   --  5.2*  --   --   --   --   --   --   IBILI  --   --   --   --  1.8*  --   --   --   --   --   --   < > = values in this interval  not displayed.   Estimated Creatinine Clearance: 53.7 mL/min (by C-G formula based on Cr of 1.14).  No results for input(s): GLUCAP in the last 72 hours.  Microbiology: Recent Results (from the past 720 hour(s))  Blood culture (routine x 2)     Status: None   Collection Time: 09/14/2014 12:45 AM  Result Value Ref Range Status   Specimen Description BLOOD  Final   Special Requests NONE  Final   Culture  Setup Time   Final    GRAM POSITIVE COCCI IN CHAINS IN BOTH AEROBIC AND ANAEROBIC BOTTLES CRITICAL RESULT CALLED TO, READ BACK BY AND VERIFIED WITH: PAM CRAWFORD AT 1406 09/15/2014 DV    Culture STREPTOCOCCUS SPECIES  Final   Report Status 10/01/2014 FINAL  Final   Organism ID, Bacteria STREPTOCOCCUS SPECIES  Final      Susceptibility   Streptococcus species - MIC (ETEST)*    ERYTHROMYCIN Value in next row Resistant      RESISTANT6    PENICILLIN Value in next row Intermediate      INTERMEDIATE0.25    CEFTRIAXONE Value in next row Sensitive      SENSITIVE0.047    VANCOMYCIN Value in next row Sensitive      SENSITIVE1.0    LEVOFLOXACIN Value in next row Sensitive      SENSITIVE1.0    * STREPTOCOCCUS SPECIES  Blood culture (routine x 2)     Status: None   Collection Time: 09/30/2014 12:45 AM  Result Value Ref Range Status   Specimen Description BLOOD  Final   Special Requests NONE  Final   Culture  Setup Time   Final    GRAM POSITIVE COCCI IN CHAINS IN BOTH AEROBIC AND ANAEROBIC BOTTLES CRITICAL RESULT CALLED TO, READ BACK BY AND VERIFIED WITH: PAM CRAWFORD AT 1406 09/24/2014 DV    Culture   Final    STREPTOCOCCUS SPECIES REFER TO ACCESSION [id number] COLLECTED ON 09/21/2014 AT 0045 FOR SENSITIVITIES    Report Status 09/30/2014 FINAL  Final    Medications:  Scheduled:  . sodium chloride   Intravenous Once  . albumin human  25 g Intravenous Q6H  . antiseptic oral rinse  7 mL Mouth Rinse q12n4p  . cefTRIAXone (ROCEPHIN)  IV  2 g Intravenous Q24H  . chlorhexidine  15 mL Mouth Rinse BID   . clindamycin (CLEOCIN) IV  600 mg Intravenous 3 times per day  . hydrocortisone sod succinate (SOLU-CORTEF) inj  50 mg Intravenous Q6H  . insulin aspart  0-9 Units Subcutaneous 4 times per day  . lactulose  20 g Oral BID  . midodrine  10 mg Oral TID  . potassium phosphate IVPB (mmol)  15 mmol Intravenous Once   Infusions:  . Marland KitchenTPN (CLINIMIX-E) Adult    . norepinephrine (LEVOPHED) Adult infusion 29.973 mcg/min (10/02/14 1000)  . phenylephrine (NEO-SYNEPHRINE) Adult infusion Stopped (10/02/14 1100)  . pureflow 1,000 mL/hr at 10/02/14 0020  . vasopressin (PITRESSIN) infusion - *FOR SHOCK* 0.03 Units/min (10/02/14 1100)    Assessment: 64 yo female admitted to ICU for septic shock requiring norepinephrine and vasopressin. Patient now on CRRT. Ceftriaxone/Clinda per ID consult.   Plan:   1. Antibiotics: Patient transitioned off vancomycin to ceftriaxone 2g IV Q24hr and Clindamycin 600mg  IV Q8hr. Will continue to monitor cultures, WBC and temperatures. Patient is receiving stress dose steroids  which is likely contributing to patient's leukocytosis.     2. Electrolytes: Phos= 2.3. Potassium Phosphate 15 mmol x 1 ordered. Will recheck with am labs. *note Patient to be started on TPN 5/29.  3. CRRT. Medications do not currently require adjustment.  Pharmacy will continue to monitor and adjust per consult.    Chinita Greenland PharmD Clinical Pharmacist 10/02/2014

## 2014-10-02 NOTE — Progress Notes (Signed)
Dr Darvin Neighbours notified of Hgb/Hct. platelet count and critical fibrin and derivatives results.  He spoke with son .  Consent for blood products obtained

## 2014-10-02 NOTE — Progress Notes (Signed)
Patient very restless with bipap, pulling off.  Son requested that it be taken off.  Bipap taken off and O2 @ 2L's via n/c applied.  Sats WNL.  Patient less restless with bipap off.

## 2014-10-02 NOTE — Progress Notes (Signed)
MEDICATION RELATED CONSULT NOTE - INITIAL   Pharmacy Consult for glucose and electrolyte management for TPN Indication: NPO  No Known Allergies  Patient Measurements: Height: 5\' 1"  (154.9 cm) Weight: 222 lb 14.2 oz (101.1 kg) IBW/kg (Calculated) : 47.8  Vital Signs: Temp: 98.4 F (36.9 C) (05/29 0727) Temp Source: Oral (05/29 0727) BP: 100/59 mmHg (05/29 1100) Pulse Rate: 102 (05/29 1000) Intake/Output from previous day: 05/28 0701 - 05/29 0700 In: 1342.1 [I.V.:627.1; IV Piggyback:715] Out: 2261 [Urine:10] Intake/Output from this shift: Total I/O In: 482.9 [I.V.:168.9; IV Piggyback:314] Out: 193 [Other:193]  Labs:  Recent Labs  09/30/14 0436  09/30/14 1345  10/01/14 0611  10/01/14 1606 10/01/14 2303 10/02/14 0151 10/02/14 0633 10/02/14 1030  WBC  --   < >  --   --  35.4*  --   --   --   --  26.0* 24.6*  HGB  --   < >  --   --  8.8*  --   --   --   --  6.1* 5.9*  HCT  --   < >  --   --  27.4*  --   --   --   --  19.1* 18.6*  PLT  --   < >  --   --  105*  --   --   --   --  74* 68*  APTT 57*  --   --   --  61*  --   --   --  57*  --   --   CREATININE  --   < > 1.01*  < > 1.15*  < > 1.16* 1.21*  --  1.16* 1.14*  MG 1.6*  --  1.8  --  1.8  --   --   --  1.7  --   --   PHOS  --   < > 1.9*  < > 2.1*  < > 2.7 2.5  --  2.3*  --   ALBUMIN  --   < > 1.1*  < > 1.5*  < > 1.7* 2.4*  --  2.5* 2.8*  PROT  --   --   --   --  4.4*  --   --   --   --   --  4.8*  AST  --   --   --   --  84*  --   --   --   --   --  94*  ALT  --   --   --   --  25  --   --   --   --   --  29  ALKPHOS  --   --   --   --  520*  --   --   --   --   --  319*  BILITOT  --   --   --   --  7.0*  --   --   --   --   --  8.9*  BILIDIR  --   --   --   --  5.2*  --   --   --   --   --   --   IBILI  --   --   --   --  1.8*  --   --   --   --   --   --   < > = values in this interval not displayed. Estimated Creatinine Clearance:  53.7 mL/min (by C-G formula based on Cr of 1.14).   Microbiology: Recent  Results (from the past 720 hour(s))  Blood culture (routine x 2)     Status: None   Collection Time: 09/19/2014 12:45 AM  Result Value Ref Range Status   Specimen Description BLOOD  Final   Special Requests NONE  Final   Culture  Setup Time   Final    GRAM POSITIVE COCCI IN CHAINS IN BOTH AEROBIC AND ANAEROBIC BOTTLES CRITICAL RESULT CALLED TO, READ BACK BY AND VERIFIED WITH: PAM CRAWFORD AT 1406 09/22/2014 DV    Culture STREPTOCOCCUS SPECIES  Final   Report Status 10/01/2014 FINAL  Final   Organism ID, Bacteria STREPTOCOCCUS SPECIES  Final      Susceptibility   Streptococcus species - MIC (ETEST)*    ERYTHROMYCIN Value in next row Resistant      RESISTANT6    PENICILLIN Value in next row Intermediate      INTERMEDIATE0.25    CEFTRIAXONE Value in next row Sensitive      SENSITIVE0.047    VANCOMYCIN Value in next row Sensitive      SENSITIVE1.0    LEVOFLOXACIN Value in next row Sensitive      SENSITIVE1.0    * STREPTOCOCCUS SPECIES  Blood culture (routine x 2)     Status: None   Collection Time: 09/22/2014 12:45 AM  Result Value Ref Range Status   Specimen Description BLOOD  Final   Special Requests NONE  Final   Culture  Setup Time   Final    GRAM POSITIVE COCCI IN CHAINS IN BOTH AEROBIC AND ANAEROBIC BOTTLES CRITICAL RESULT CALLED TO, READ BACK BY AND VERIFIED WITH: PAM CRAWFORD AT 1406 09/29/2014 DV    Culture   Final    STREPTOCOCCUS SPECIES REFER TO ACCESSION [id number] COLLECTED ON 09/27/2014 AT 0045 FOR SENSITIVITIES    Report Status 09/30/2014 FINAL  Final    Medical History: Past Medical History  Diagnosis Date  . HTN (hypertension)   . Diabetes mellitus type II   . Obesity   . Arthritis   . Chronic kidney disease   . Amyloidosis      Assessment: 64 yo female being started on TPN, pt is NPO; pharmacy consulted to manage electrolytes and glucose. Pt with hx of DM per H&P.  Current orders for Clinimix E 5/20 at 25 ml/hr with MVI and Trace elements K 4.7, Mag 1.7, Phos  2.3 KPhos 15 mmol x1 already ordered for today  Plan:  Electrolytes already addressed today - will follow up labs in AM, labs ordered  Will order SSI q6h in this pt with hx of DM   Rayna Sexton L 10/02/2014,12:14 PM

## 2014-10-02 NOTE — Progress Notes (Signed)
No blood transfusion reaction noted.

## 2014-10-02 NOTE — Progress Notes (Signed)
[id number]  FFP completed

## 2014-10-02 NOTE — Progress Notes (Signed)
[facility] [place] NAME: [name]    MR#:  [id number]  DATE OF BIRTH:  07-12-1950  SUBJECTIVE:  CHIEF COMPLAINT:   Chief Complaint  Patient presents with  . Altered Mental Status    Critically ill. Opens eyes but not verbal. On 3 pressors still. Not eating. Anuric. Getting CRRT. Family not at bedside.  REVIEW OF SYSTEMS:    ROS  Unable to give history due to encephalopathy     DRUG ALLERGIES:  No Known Allergies  VITALS:  Blood pressure 94/60, pulse 103, temperature 98.4 F (36.9 C), temperature source Oral, resp. rate 24, height 5\' 1"  (1.549 m), weight 101.1 kg (222 lb 14.2 oz), SpO2 98 %.  PHYSICAL EXAMINATION:   Physical Exam  GENERAL:  64 y.o.-year-old patient lying in the bed drowzy. EYES: Pupils equal, round, reactive to light and accommodation. No scleral icterus. Extraocular muscles intact. HEENT: Head atraumatic, normocephalic. Oropharynx and nasopharynx clear. NECK:  Supple, no jugular venous distention. No thyroid enlargement, no tenderness. Right IJ TL. LUNGS: Normal breath sounds bilaterally, no wheezing, rales, rhonchi. No use of accessory muscles of respiration. CARDIOVASCULAR: S1, S2 normal. No murmurs, rubs, or gallops. ABDOMEN: Soft, nontender, nondistended. Bowel sounds present. No organomegaly or mass. EXTREMITIES: No cyanosis, clubbing. Anasarca. Open superficial ulcer right lateral thigh. NEUROLOGIC: Drowzy.lethargic.  PSYCHIATRIC: The patient is drowzy. SKIN:  Erythema with warmth right abdominal wall, groin,right lateral thigh. Superficial ulcer right thigh.  LABORATORY PANEL:   CBC  Recent Labs Lab 10/01/14 0611  WBC 35.4*  HGB 8.8*  HCT 27.4*  PLT 105*   ------------------------------------------------------------------------------------------------------------------  Chemistries   Recent Labs Lab 10/01/14 0611  10/02/14 0151 10/02/14 0633  NA 132*  < >  --  133*   K 4.2  < >  --  4.5  CL 99*  < >  --  102  CO2 25  < >  --  25  GLUCOSE 122*  < >  --  184*  BUN 18  < >  --  25*  CREATININE 1.15*  < >  --  1.16*  CALCIUM 8.0*  < >  --  8.2*  MG 1.8  --  1.7  --   AST 84*  --   --   --   ALT 25  --   --   --   ALKPHOS 520*  --   --   --   BILITOT 7.0*  --   --   --   < > = values in this interval not displayed. ------------------------------------------------------------------------------------------------------------------  Cardiac Enzymes No results for input(s): TROPONINI in the last 168 hours. ------------------------------------------------------------------------------------------------------------------  RADIOLOGY:  Ct Abdomen Pelvis Wo Contrast  10/01/2014   CLINICAL DATA:  Right flank cellulitis and edema.  EXAM: CT ABDOMEN AND PELVIS WITHOUT CONTRAST  TECHNIQUE: Multidetector CT imaging of the abdomen and pelvis was performed following the standard protocol without IV contrast.  COMPARISON:  08/31/2014  FINDINGS: Lower chest: Small bilateral pleural effusions are noted left greater than right. There is airspace consolidation noted within both lung bases. Patchy ground-glass and airspace opacity within the medial left upper lobe is noted, image number 3/series 4.  Hepatobiliary: There is no suspicious liver abnormality identified. Increased attenuation within the gallbladder may Roux represent stones and/or sludge. No biliary dilatation.  Pancreas: Normal appearance of the pancreas.  Spleen: The spleen is on unremarkable.  Adrenals/Urinary Tract: Normal appearance of the left adrenal gland. Similar appearance of  low-attenuation right adrenal gland nodule measuring 2.9 cm, image 30/series 2. The kidneys are both on unremarkable. The urinary bladder is collapsed around a Foley catheter balloon.  Stomach/Bowel: Moderate distension of the stomach containing fluid level. The small bowel loops have a normal course and caliber. Normal appearance of the  colon.  Vascular/Lymphatic: There is a right femoral dialysis catheter with tip in the IVC. Abdominal aorta has a normal caliber. No adenopathy.  Reproductive: Previous hysterectomy.  No adnexal mass.  Other: Moderate free fluid is identified within the abdomen and pelvis. There is diffuse body wall edema and skin thickening. No focal fluid collection identified to suggest abscess.  Musculoskeletal: Degenerative disc disease noted within the lumbar spine.  IMPRESSION: 1. Bilateral pleural effusions, ascites and anasarca is identified consistent with fluid overload state. 2. Subcutaneous fat stranding and skin thickening may be related to anasarca or cellulitis. 3. No fluid collections identified. 4. Left lung opacities may reflect pneumonia and/r or areas of asymmetric edema.   Electronically Signed   By: Kerby Moors M.D.   On: 10/01/2014 15:17     ASSESSMENT AND PLAN:   64 year old African American female history of nephrotic syndrome, amyloidosis, essential hypertension presenting with altered mental status/confusion after apparently falling out of bed. Admitted for septic shock  * Septic shock with right flank/thigh cellulitis with streptococcus bacteremia Changed to Ceftriaxone and clindamycin. No improvement inspite of aggressive care. Appreciate Dr. Blane Ohara input.  Echo-No vegetations. On solucortef. Still on 3 pressors.  * Acute renal failure over CKD3 due to ATN. From septic shock. On CRRT. Appreciate nephrology help. Severe hypoalbuminemia. Getting IV albumin. Will likely progress to ESRD.  * Acute liver failure- Due to sepsis Ct abd showed no biliary dilation or obstruction  * Elevated INR Could be from liver failure. Concern for DIC. TTP? Discussed with Hem-onc Georgeanne Nim. Needs peripheral smear.  * Acute encephalopathy - Due to acute illness. CT head nothing acute.  * Amyloidosis Etiology unknonw. Discussed with Dr. Grayland Ormond. Plan was for chemo as OP but no on  hold due to acute illness. Checking IgG. Will give immunoglobulins if low per discussion with Dr. Grayland Ormond.  * Anemia of chronic disease- stable  * Type 2 diabetes, uncomplicated: Hold oral agents, at insulin slide scale every 6 hours Accu-Chek  * Essential hypertension: Held all meds  * Venous thromboembolism prophylactic: Heparin subcutaneous  * Nutrition NG would be risky with high INR for bleeding.   All the records are reviewed and case discussed with Care Management/Social Workerr. Management plans discussed with the patient, family and they are in agreement.  CODE STATUS: FULL CODE  Dicussed with daughter in law over the phone. Poor prognosis. No response inspite of aggressive care. Further deterioration with liver failure, high INR. Slowly worsening. She understands poor prognosis.  DVT Prophylaxis: Heparin discontinued due to INR/platelets  TOTAL CRITICAL CARE TIME TAKING CARE OF THIS PATIENT: 50 minutes.    Hillary Bow R M.D on 10/02/2014 at 9:49 AM  Between 7am to 6pm - Pager - [phone]  After 6pm go to [email or web address] - password [email or web address] [facility]  Office  [phone]  CC: Primary care physician; [name], MD     Significant bilirubinemia with mostly direct. Will get Ct abdomen for any abdominal source of infection and also consult GI.

## 2014-10-02 NOTE — Progress Notes (Signed)
Blood infusing without difficulty.  No reaction after 15 minutes

## 2014-10-02 NOTE — Progress Notes (Signed)
Dressing to right lateral thigh removed.  Wound with moderate amount of bleeding, but not oozing.  Site covered with Vaseline gauze,  4 x 4 guaze and abd pads, secured with paper tape.  Site has large amount of serous drainage throughout the night, with pads being changed multiple times.  CRRT has ran well tonight.  Remains on vasopressors. Titrating slowly to maintain MAP > 65.  Urine output 10 mls for this shift.  Patient remains lethargic, only opens eyes when name called or stimulated.  Has been non-verbal and does not follow commands.    ST on cardiac monitor.  INR critical.  Value called to Dr. Rayann Heman.  Repeated lab, unchanged.

## 2014-10-02 NOTE — Progress Notes (Signed)
Spoke with Dr. Jannifer Franklin about chest xray results.  Bipap ordered.

## 2014-10-02 NOTE — Progress Notes (Signed)
[id number] started

## 2014-10-02 NOTE — Progress Notes (Signed)
Nutrition Follow-up  DOCUMENTATION CODES:     INTERVENTION:   (Parenteral Nutrition) Spoke with MD Rhine regarding nutrition poc.  MD wanting to initiate TPN. MD aware of multiple pressors, low urine output, electrolyte status and fluid balance. With elevated INR, MD not wanting to place NGtube for enteral feeds at this time. Will recommend 5%AA/20% Dextrose with a current goal rate of 44m/hr to provide 1161kcals and 66g protein, 13235mof fluid (93% of estimated energy needs, 100% of protein needs). Will recommend starting TPN at 2563mr and titrate pending labs. Will make adjustments pending lab work. [facility] aware of TPN order and will be following electrolytes and glucose management. Spoke with RN Pam, aware of order.  NUTRITION DIAGNOSIS:  Inadequate oral intake related to inability to eat as evidenced by NPO status; being addressed with nutrition support.  GOAL:  Patient will meet greater than or equal to 90% of their needs (Goal for tolerance of initiation of TPN, titration within 24-48hours)  MONITOR:   (PN, Electrolyte and Renal Profile, Digestive System, Glucose Profile, Anthropometrics, UOP )  REASON FOR ASSESSMENT:  Consult New TPN/TNA  ASSESSMENT:  Pt with elevated INR this am, transfusion scheduled today secondary to CBC results. Pt remains on CRRT. Three pressors ordered, per RN Pam, Neo-synephrine titrated off this am with decrease in levophed.  Pt remains edematous with albumin ordered.  Pt also with acute liver failure.  Medications: KPhos, Albumin, Lactulose, Levophed, Vasopressin Labs: Electrolyte and Renal Profile:    Recent Labs Lab 09/30/14 1345  10/01/14 0611  10/01/14 1606 10/01/14 2303 10/02/14 0151 10/02/14 0633 10/02/14 1030  BUN 16  < > 18  < > 21* 22*  --  25* 27*  CREATININE 1.01*  < > 1.15*  < > 1.16* 1.21*  --  1.16* 1.14*  NA 133*  < > 132*  < > 137 135  --  133* 135  K 4.3  < > 4.2  < > 4.6 4.5  --  4.5  4.7  MG 1.8  --  1.8  --   --   --  1.7  --   --   PHOS 1.9*  < > 2.1*  < > 2.7 2.5  --  2.3*  --   < > = values in this interval not displayed. Glucose Profile: FSBS 176, 184, 163 Protein Profile:  Recent Labs Lab 10/01/14 2303 10/02/14 0633 10/02/14 1030  ALBUMIN 2.4* 2.5* 2.8*   CBC Latest Ref Rng 10/02/2014 10/02/2014 10/01/2014  WBC 3.6 - 11.0 K/uL 24.6(H) 26.0(H) 35.4(H)  Hemoglobin 12.0 - 16.0 g/dL 5.9(L) 6.1(L) 8.8(L)  Hematocrit 35.0 - 47.0 % 18.6(L) 19.1(L) 27.4(L)  Platelets 150 - 440 K/uL 68(L) 74(L) 105(L)  Other: INR 5.86, 5.87 Hepatic Function Latest Ref Rng 10/02/2014 10/02/2014 10/01/2014  Total Protein 6.5 - 8.1 g/dL 4.8(L) - -  Albumin 3.5 - 5.0 g/dL 2.8(L) 2.5(L) 2.4(L)  AST 15 - 41 U/L 94(H) - -  ALT 14 - 54 U/L 29 - -  Alk Phosphatase 38 - 126 U/L 319(H) - -  Total Bilirubin 0.3 - 1.2 mg/dL 8.9(H) - -  Bilirubin, Direct 0.1 - 0.5 mg/dL - - -    Weight trend during admission: Filed Weights   09/30/14 0600 10/01/14 0500 10/02/14 0421  Weight: 234 lb 9.6 oz (106.414 kg) 222 lb 14.2 oz (101.1 kg) 222 lb 14.2 oz (101.1 kg)    Estimated Nutritional Needs:  Kcal:  BEE 962 kcals (IF 1.0-1.2, AF 1.3)  1250-1500 kcals/d Using IBW of 48kg  Protein:  1.0-1.2 g/d (48-58 g/d) Using IBW of 48kg  Fluid:  (25-27m/kg) 1200-1443md  Skin:  Reviewed, no issues  Diet Order:  Diet NPO time specified .TPN (CLINIMIX-E) Adult  EDUCATION NEEDS:  No education needs identified at this time   Intake/Output Summary (Last 24 hours) at 10/02/14 1206 Last data filed at 10/02/14 1100  Gross per 24 hour  Intake   1596 ml  Output   1974 ml  Net   -378 ml    Last BM:  5/25 Gastrointestinal Profile: faint BS, non-tender, abdomen distended  HIGH Care Level  AlDwyane LuoRD, LDN Pager [phone]

## 2014-10-03 ENCOUNTER — Inpatient Hospital Stay: Payer: Medicare Other

## 2014-10-03 DIAGNOSIS — T82534A Leakage of infusion catheter, initial encounter: Secondary | ICD-10-CM

## 2014-10-03 LAB — COMPREHENSIVE METABOLIC PANEL
ALK PHOS: 233 U/L — AB (ref 38–126)
ALT: 27 U/L (ref 14–54)
AST: 78 U/L — ABNORMAL HIGH (ref 15–41)
Albumin: 3.1 g/dL — ABNORMAL LOW (ref 3.5–5.0)
Anion gap: 7 (ref 5–15)
BUN: 23 mg/dL — ABNORMAL HIGH (ref 6–20)
CO2: 26 mmol/L (ref 22–32)
Calcium: 8.2 mg/dL — ABNORMAL LOW (ref 8.9–10.3)
Chloride: 104 mmol/L (ref 101–111)
Creatinine, Ser: 0.75 mg/dL (ref 0.44–1.00)
GFR calc Af Amer: >60 mL/min (ref >60)
GFR calc non Af Amer: >60 mL/min (ref >60)
GLUCOSE: 163 mg/dL — AB (ref 65–99)
Potassium: 3.6 mmol/L (ref 3.5–5.1)
SODIUM: 137 mmol/L (ref 135–145)
TOTAL PROTEIN: 4.9 g/dL — AB (ref 6.5–8.1)
Total Bilirubin: 12.1 mg/dL — ABNORMAL HIGH (ref 0.3–1.2)

## 2014-10-03 LAB — DAT, POLYSPECIFIC AHG (ARMC ONLY): Polyspecific AHG test: NEGATIVE

## 2014-10-03 LAB — BLOOD GAS, ARTERIAL
ACID-BASE EXCESS: 0.3 mmol/L (ref 0.0–3.0)
Allens test (pass/fail): POSITIVE — AB
Bicarbonate: 26.5 mEq/L (ref 21.0–28.0)
DRAWN BY: 38173
FIO2: 1 %
O2 Saturation: 99.9 %
PEEP/CPAP: 8 cmH2O
Patient temperature: 37
RATE: 20 resp/min
VT: 450 mL
pCO2 arterial: 48 mmHg (ref 32.0–48.0)
pH, Arterial: 7.35 (ref 7.350–7.450)
pO2, Arterial: 289 mmHg — ABNORMAL HIGH (ref 83.0–108.0)

## 2014-10-03 LAB — PREALBUMIN: PREALBUMIN: 5.1 mg/dL — AB (ref 18–38)

## 2014-10-03 LAB — PROTIME-INR
INR: 2.55
Prothrombin Time: 27.5 seconds — ABNORMAL HIGH (ref 11.4–15.0)

## 2014-10-03 LAB — CBC
HCT: 23.9 % — ABNORMAL LOW (ref 35.0–47.0)
Hemoglobin: 7.9 g/dL — ABNORMAL LOW (ref 12.0–16.0)
MCH: 28.7 pg (ref 26.0–34.0)
MCHC: 33.1 g/dL (ref 32.0–36.0)
MCV: 86.7 fL (ref 80.0–100.0)
PLATELETS: 47 10*3/uL — AB (ref 150–440)
RBC: 2.75 MIL/uL — ABNORMAL LOW (ref 3.80–5.20)
RDW: 16.9 % — AB (ref 11.5–14.5)
WBC: 17.2 10*3/uL — AB (ref 3.6–11.0)

## 2014-10-03 LAB — HEMOGLOBIN: Hemoglobin: 7.9 g/dL — ABNORMAL LOW (ref 12.0–16.0)

## 2014-10-03 LAB — MAGNESIUM: MAGNESIUM: 1.5 mg/dL — AB (ref 1.7–2.4)

## 2014-10-03 LAB — TRIGLYCERIDES: TRIGLYCERIDES: 98 mg/dL (ref <150)

## 2014-10-03 LAB — GLUCOSE, CAPILLARY
GLUCOSE-CAPILLARY: 156 mg/dL — AB (ref 65–99)
Glucose-Capillary: 144 mg/dL — ABNORMAL HIGH (ref 65–99)
Glucose-Capillary: 170 mg/dL — ABNORMAL HIGH (ref 65–99)
Glucose-Capillary: 164 mg/dL — ABNORMAL HIGH (ref 65–99)
Glucose-Capillary: 159 mg/dL — ABNORMAL HIGH (ref 65–99)

## 2014-10-03 LAB — APTT: APTT: 50 s — AB (ref 24–36)

## 2014-10-03 MED ORDER — EMPTY CONTAINERS FLEXIBLE MISC
10.0000 ug/h | Status: DC
  Filled 2014-10-03: qty 100

## 2014-10-03 MED ORDER — ACETAMINOPHEN 325 MG PO TABS
650.0000 mg | ORAL_TABLET | Freq: Every day | ORAL | Status: AC
  Administered 2014-10-04: 650 mg via ORAL
  Filled 2014-10-03: qty 2

## 2014-10-03 MED ORDER — VECURONIUM BROMIDE 10 MG IV SOLR
INTRAVENOUS | Status: AC
  Administered 2014-10-03: 10 mg via INTRAVENOUS
  Filled 2014-10-03: qty 10

## 2014-10-03 MED ORDER — MIDAZOLAM HCL 2 MG/2ML IJ SOLN
INTRAMUSCULAR | Status: AC
  Administered 2014-10-03: 0.5 mg via INTRAVENOUS
  Filled 2014-10-03: qty 2

## 2014-10-03 MED ORDER — MIDAZOLAM HCL 2 MG/2ML IJ SOLN
INTRAMUSCULAR | Status: AC
  Administered 2014-10-03: 2 mg via INTRAVENOUS
  Filled 2014-10-03: qty 2

## 2014-10-03 MED ORDER — SODIUM CHLORIDE 0.9 % IJ SOLN
10.0000 mL | Freq: Two times a day (BID) | INTRAMUSCULAR | Status: DC
  Administered 2014-10-03: 10 mL
  Administered 2014-10-04: 20 mL
  Administered 2014-10-04: 10 mL
  Administered 2014-10-07: 20 mL
  Administered 2014-10-08: 10 mL
  Administered 2014-10-09: 20 mL
  Administered 2014-10-10 – 2014-10-11 (×2): 30 mL
  Administered 2014-10-12: 10 mL
  Administered 2014-10-12: 30 mL
  Administered 2014-10-14 – 2014-10-15 (×2): 10 mL
  Administered 2014-10-17: 30 mL

## 2014-10-03 MED ORDER — IMMUNE GLOBULIN (HUMAN) 10 GM/100ML IV SOLN
500.0000 mg/kg | INTRAVENOUS | Status: AC
  Administered 2014-10-03 – 2014-10-04 (×2): 50 g via INTRAVENOUS
  Filled 2014-10-03 (×3): qty 500

## 2014-10-03 MED ORDER — HYDROCORTISONE NA SUCCINATE PF 100 MG IJ SOLR
50.0000 mg | Freq: Two times a day (BID) | INTRAMUSCULAR | Status: DC
  Administered 2014-10-03 – 2014-10-07 (×8): 50 mg via INTRAVENOUS
  Filled 2014-10-03 (×8): qty 2

## 2014-10-03 MED ORDER — POTASSIUM CHLORIDE 10 MEQ/100ML IV SOLN
10.0000 meq | INTRAVENOUS | Status: AC
  Administered 2014-10-03 (×3): 10 meq via INTRAVENOUS
  Filled 2014-10-03 (×3): qty 100

## 2014-10-03 MED ORDER — SODIUM CHLORIDE 0.9 % IJ SOLN
INTRAMUSCULAR | Status: AC
  Administered 2014-10-03: 10 mL
  Filled 2014-10-03: qty 10

## 2014-10-03 MED ORDER — VECURONIUM BROMIDE 10 MG IV SOLR
10.0000 mg | Freq: Once | INTRAVENOUS | Status: AC
  Administered 2014-10-03: 10 mg via INTRAVENOUS

## 2014-10-03 MED ORDER — TRACE MINERALS CR-CU-MN-SE-ZN 10-1000-500-60 MCG/ML IV SOLN
INTRAVENOUS | Status: AC
  Administered 2014-10-03: 20:00:00 via INTRAVENOUS
  Filled 2014-10-03: qty 960

## 2014-10-03 MED ORDER — CHLORHEXIDINE GLUCONATE 0.12 % MT SOLN
15.0000 mL | Freq: Two times a day (BID) | OROMUCOSAL | Status: DC
  Administered 2014-10-03 – 2014-10-18 (×30): 15 mL via OROMUCOSAL

## 2014-10-03 MED ORDER — POTASSIUM CHLORIDE 10 MEQ/100ML IV SOLN
10.0000 meq | INTRAVENOUS | Status: DC
  Administered 2014-10-03: 10 meq via INTRAVENOUS
  Filled 2014-10-03 (×4): qty 100

## 2014-10-03 MED ORDER — SODIUM CHLORIDE 0.9 % IJ SOLN: 10.0000 mL | INTRAMUSCULAR | Status: DC | PRN

## 2014-10-03 MED ORDER — PANTOPRAZOLE SODIUM 40 MG IV SOLR
80.0000 mg | Freq: Two times a day (BID) | INTRAVENOUS | Status: DC
  Administered 2014-10-03 – 2014-10-07 (×10): 80 mg via INTRAVENOUS
  Filled 2014-10-03 (×12): qty 80

## 2014-10-03 MED ORDER — PANTOPRAZOLE SODIUM 40 MG PO TBEC: 80.0000 mg | DELAYED_RELEASE_TABLET | Freq: Two times a day (BID) | ORAL | Status: DC

## 2014-10-03 MED ORDER — MAGNESIUM SULFATE 2 GM/50ML IV SOLN
2.0000 g | Freq: Once | INTRAVENOUS | Status: AC
  Administered 2014-10-03: 2 g via INTRAVENOUS
  Filled 2014-10-03: qty 50

## 2014-10-03 MED ORDER — FENTANYL CITRATE (PF) 100 MCG/2ML IJ SOLN
100.0000 ug | Freq: Once | INTRAMUSCULAR | Status: AC
  Administered 2014-10-03: 100 ug via INTRAVENOUS

## 2014-10-03 MED ORDER — FENTANYL 2500MCG IN NS 250ML (10MCG/ML) PREMIX INFUSION
10.0000 ug/h | INTRAVENOUS | Status: DC
  Administered 2014-10-03 – 2014-10-05 (×3): 10 ug/h via INTRAVENOUS
  Administered 2014-10-05: 150 ug/h via INTRAVENOUS
  Administered 2014-10-06: 400 ug/h via INTRAVENOUS
  Administered 2014-10-06: 300 ug/h via INTRAVENOUS
  Administered 2014-10-06 – 2014-10-08 (×4): 400 ug/h via INTRAVENOUS
  Administered 2014-10-08: 10 ug/h via INTRAVENOUS
  Administered 2014-10-09: 400 ug/h via INTRAVENOUS
  Administered 2014-10-09 – 2014-10-11 (×3): 10 ug/h via INTRAVENOUS
  Administered 2014-10-11 – 2014-10-12 (×2): 200 ug/h via INTRAVENOUS
  Administered 2014-10-13 (×2): 10 ug/h via INTRAVENOUS
  Administered 2014-10-13: 400 ug/h via INTRAVENOUS
  Administered 2014-10-13: 300 ug/h via INTRAVENOUS
  Administered 2014-10-13: 250 ug/h via INTRAVENOUS
  Administered 2014-10-14 (×2): 10 ug/h via INTRAVENOUS
  Administered 2014-10-15 – 2014-10-16 (×2): 100 ug/h via INTRAVENOUS
  Administered 2014-10-17: 150 ug/h via INTRAVENOUS
  Administered 2014-10-18: 100 ug/h via INTRAVENOUS
  Administered 2014-10-18: 300 ug/h via INTRAVENOUS
  Filled 2014-10-03 (×30): qty 250

## 2014-10-03 MED ORDER — MIDAZOLAM HCL 2 MG/2ML IJ SOLN
2.0000 mg | Freq: Once | INTRAMUSCULAR | Status: AC
  Administered 2014-10-03: 2 mg via INTRAVENOUS

## 2014-10-03 MED ORDER — MIDAZOLAM HCL 2 MG/2ML IJ SOLN
0.5000 mg | Freq: Once | INTRAMUSCULAR | Status: AC
  Administered 2014-10-03: 0.5 mg via INTRAVENOUS

## 2014-10-03 MED ORDER — FENTANYL CITRATE (PF) 100 MCG/2ML IJ SOLN
INTRAMUSCULAR | Status: AC
  Administered 2014-10-03: 100 ug via INTRAVENOUS
  Filled 2014-10-03: qty 2

## 2014-10-03 MED ORDER — PROPOFOL 1000 MG/100ML IV EMUL: 5.0000 ug/kg/min | INTRAVENOUS | Status: DC

## 2014-10-03 MED ORDER — CETYLPYRIDINIUM CHLORIDE 0.05 % MT LIQD
7.0000 mL | Freq: Four times a day (QID) | OROMUCOSAL | Status: DC
  Administered 2014-10-04 – 2014-10-18 (×58): 7 mL via OROMUCOSAL

## 2014-10-03 MED ORDER — PUREFLOW DIALYSIS SOLUTION
INTRAVENOUS | Status: DC
  Administered 2014-10-03 – 2014-10-10 (×15): via INTRAVENOUS_CENTRAL
  Filled 2014-10-03 (×4): qty 5000

## 2014-10-03 NOTE — Progress Notes (Signed)
CXR performed and appears that right IJ does curl in neck, notified Dr. Pat Patrick of results.  Per Dr. Pat Patrick he will come and evaluate whether line can be salvaged and reposioned or whether new line needs to be placed.  All fluids have previosly been stopped from central line, stopped when last xray results received and when inspection of line revealed leaking.  Levophed and vasopressin infusing through right ac 22g (blood return present), all other fluids on hold until line placement resolved.

## 2014-10-03 NOTE — Progress Notes (Signed)
Pt having increased work of breathing, assessory muscle use.  Pt previously placed on nonrebreather.  Notified Dr. Darvin Neighbours of pt work of breathing in increase in O2 requirements.  Per Dr. Darvin Neighbours he will send Dr.  Volanda Napoleon to come and examine pt for need for intubation.

## 2014-10-03 NOTE — Progress Notes (Signed)
Pt O2 saturations 86% on [place] (previously 94-98%), pt placed on nonrebreather mask 100%.

## 2014-10-03 NOTE — Progress Notes (Signed)
Dr. Trula Slade able to thread new central line (right IJ TLC) into place and removed old right IJ triple lumen.  Line sutured into place, flushes and draws back blood, line confirmed with x-ray placement and may be used for iv adminstration per Dr. Trula Slade.

## 2014-10-03 NOTE — Progress Notes (Signed)
Called to evaluate patient for increased work of breathing and altered mental status. Current 02 sat 88-100 on ventimask at 100%. Lung sounds coarse, rhochi and wheezes, shallow resps, using abdominal muscles to exhale. She is confused, not answering to voice or following commands. CXR with possible multifocal PNA, evolving L effusion. AM ABG with p02 of 49.  Agree with need for intubation. Have consulted PCCM Dr. Vella Kohler.  Have discussed with her son Kara Dies and will discuss with daughter in law who is en-route.  Sepsis-  due to strep bacteremia on rocephin and clindamycin.  Continued hypotension on levophed, midorine PO, vasopressin Stress dose steriods  ARF on CKD 3 on CRRT anuric  Shock liver- lactulose  Encephalopathy due to above, worsening with hypoxia  Amyloidosis- chemo had been planned  Anemia s/p 2 u prbc's on 5/29  DM- cbg's controlled on SSI  Sepsis with multiorgan failure. Very poor prognosis.

## 2014-10-03 NOTE — Progress Notes (Signed)
[facility] NOTE  Pharmacy Consult for Antibiotic Dosing, Electrolyte Management,CRRT, TPN   Indication: Septic Shock, ICU Status   No Known Allergies  Patient Measurements: Height: 5\' 1"  (154.9 cm) Weight: 222 lb 0.1 oz (100.7 kg) IBW/kg (Calculated) : 47.8   Vital Signs: Temp: 97.9 F (36.6 C) (05/30 0500) Temp Source: Oral (05/30 0500) BP: 92/64 mmHg (05/30 0700) Pulse Rate: 90 (05/30 0700) Intake/Output from previous day: 05/29 0701 - 05/30 0700 In: 3647.2 [I.V.:874.2; IONGE:9528; IV Piggyback:800; TPN:25] Out: 2077 [Urine:5; Emesis/NG output:75] Intake/Output from this shift:   Vent settings for last 24 hours:    Labs:  Recent Labs  10/01/14 0611  10/01/14 2303 10/02/14 0151 10/02/14 0633 10/02/14 1030 10/02/14 1540 10/03/14 0558  WBC 35.4*  --   --   --  26.0* 24.6*  --  17.2*  HGB 8.8*  --   --   --  6.1* 5.9*  --  7.9*  HCT 27.4*  --   --   --  19.1* 18.6*  --  23.9*  PLT 105*  --   --   --  74* 68*  --  47*  APTT 61*  --   --  57*  --   --   --  50*  INR  --   --  5.86* 5.87*  --   --   --  2.55  CREATININE 1.15*  < > 1.21*  --  1.16* 1.14* 1.19* 0.75  MG 1.8  --   --  1.7  --   --   --  1.5*  PHOS 2.1*  < > 2.5  --  2.3*  --  3.1  --   ALBUMIN 1.5*  < > 2.4*  --  2.5* 2.8* 3.1* 3.1*  PROT 4.4*  --   --   --   --  4.8*  --  4.9*  AST 84*  --   --   --   --  94*  --  78*  ALT 25  --   --   --   --  29  --  27  ALKPHOS 520*  --   --   --   --  319*  --  233*  BILITOT 7.0*  --   --   --   --  8.9*  --  12.1*  BILIDIR 5.2*  --   --   --   --   --   --   --   IBILI 1.8*  --   --   --   --   --   --   --   < > = values in this interval not displayed.   Estimated Creatinine Clearance: 76.4 mL/min (by C-G formula based on Cr of 0.75).   Recent Labs  10/02/14 1318 10/03/14 0014 10/03/14 0615  GLUCAP 157* 156* 144*    Microbiology: Recent Results (from the past 720 hour(s))  Blood culture (routine x 2)     Status: None   Collection Time: 09/21/2014 12:45 AM  Result Value Ref Range Status   Specimen Description BLOOD  Final   Special Requests NONE  Final   Culture  Setup Time   Final    GRAM POSITIVE COCCI IN CHAINS IN BOTH AEROBIC AND ANAEROBIC BOTTLES CRITICAL RESULT CALLED TO, READ BACK BY AND VERIFIED WITH: PAM CRAWFORD AT 1406 09/30/2014 DV    Culture STREPTOCOCCUS SPECIES  Final   Report Status 10/01/2014 FINAL  Final  Organism ID, Bacteria STREPTOCOCCUS SPECIES  Final      Susceptibility   Streptococcus species - MIC (ETEST)*    ERYTHROMYCIN Value in next row Resistant      RESISTANT6    PENICILLIN Value in next row Intermediate      INTERMEDIATE0.25    CEFTRIAXONE Value in next row Sensitive      SENSITIVE0.047    VANCOMYCIN Value in next row Sensitive      SENSITIVE1.0    LEVOFLOXACIN Value in next row Sensitive      SENSITIVE1.0    * STREPTOCOCCUS SPECIES  Blood culture (routine x 2)     Status: None   Collection Time: 10/03/2014 12:45 AM  Result Value Ref Range Status   Specimen Description BLOOD  Final   Special Requests NONE  Final   Culture  Setup Time   Final    GRAM POSITIVE COCCI IN CHAINS IN BOTH AEROBIC AND ANAEROBIC BOTTLES CRITICAL RESULT CALLED TO, READ BACK BY AND VERIFIED WITH: PAM CRAWFORD AT 1406 09/05/2014 DV    Culture   Final    STREPTOCOCCUS SPECIES REFER TO ACCESSION [id number] COLLECTED ON 09/06/2014 AT 0045 FOR SENSITIVITIES    Report Status 09/30/2014 FINAL  Final    Medications:  Scheduled:   albumin human  25 g Intravenous Q6H   antiseptic oral rinse  7 mL Mouth Rinse q12n4p   cefTRIAXone (ROCEPHIN)  IV  2 g Intravenous Q24H   chlorhexidine  15 mL Mouth Rinse BID   clindamycin (CLEOCIN) IV  600 mg Intravenous 3 times per day   hydrocortisone sod succinate (SOLU-CORTEF) inj  50 mg Intravenous Q6H   insulin aspart  0-9 Units Subcutaneous 4 times per day   lactulose  20 g Oral BID   magnesium sulfate 1 - 4 g bolus IVPB  2 g Intravenous Once   midodrine  10  mg Oral TID   pantoprazole (PROTONIX) IV  80 mg Intravenous Q12H   potassium chloride  10 mEq Intravenous Q1 Hr x 4   Infusions:   .TPN (CLINIMIX-E) Adult 25 mL/hr at 10/03/14 0700   norepinephrine (LEVOPHED) Adult infusion 20.053 mcg/min (10/03/14 0700)   phenylephrine (NEO-SYNEPHRINE) Adult infusion Stopped (10/02/14 1100)   pureflow 1,000 mL/hr at 10/02/14 2315   vasopressin (PITRESSIN) infusion - *FOR SHOCK* 0.03 Units/min (10/03/14 0700)    Assessment: 64 yo female admitted to ICU for septic shock requiring norepinephrine and vasopressin. Patient now on CRRT. Ceftriaxone/Clinda per ID consult.   Plan:   1. Antibiotics: Patient transitioned off vancomycin to ceftriaxone 2g IV Q24hr and Clindamycin 600mg  IV Q8hr. Will continue to monitor cultures, WBC and temperatures. Patient is receiving stress dose steroids which is likely contributing to patient's leukocytosis.     2. Electrolytes: Phos 3.1; K 3.6; Mg 1.5. Ordered 2 g magnesium, 10 mEq KCl q1h x4.  Renal function panel q8h and magnesium daily already ordered, added potassium for 5/31 AM.  3. TPN: current orders for Clinimix E 5/20 at 25 mL/hr with trace/MVI. Electrolytes addressed, SSI ordered q6h  4. CRRT. Medications do not currently require adjustment.  Pharmacy will continue to monitor and adjust per consult.    Abbott Pao   10/03/2014

## 2014-10-03 NOTE — Progress Notes (Signed)
Radiology called results to RN regarding malposition of right IJ (being curled in neck).  Notified Dr. Darvin Neighbours of results, Dr. Pat Patrick on floor also looked at x-ray results.  Per Dr. Darvin Neighbours and Dr. Pat Patrick obtain stat xray to recheck, and if needed Dr. Pat Patrick will place new central line.  Right IJ is leaking clear fluid from site, Dr. Darvin Neighbours and Dr. Pat Patrick aware.

## 2014-10-03 NOTE — Progress Notes (Signed)
Patient vomited estimate 75 mls of dark emesis.  Dr. Jannifer Franklin notified.  Protonix IV ordered

## 2014-10-03 NOTE — Progress Notes (Signed)
Subjective:  Pt remains on CRRT. Overall progressing well on it.  Remains on pressors.  Also on TPN now.   Objective:  Vital signs in last 24 hours:  Temp:  [97.3 F (36.3 C)-97.9 F (36.6 C)] 97.9 F (36.6 C) (05/30 0500) Pulse Rate:  [84-105] 84 (05/30 1005) Resp:  [14-27] 23 (05/30 0800) BP: (75-133)/(48-112) 86/54 mmHg (05/30 1005) SpO2:  [40 %-100 %] 100 % (05/30 1005) Weight:  [100.7 kg (222 lb 0.1 oz)] 100.7 kg (222 lb 0.1 oz) (05/30 0604)  Weight change: -0.4 kg (-14.1 oz) Filed Weights   10/01/14 0500 10/02/14 0421 10/03/14 0604  Weight: 101.1 kg (222 lb 14.2 oz) 101.1 kg (222 lb 14.2 oz) 100.7 kg (222 lb 0.1 oz)    Intake/Output: I/O last 3 completed shifts: In: 2671 [I.V.:1448; Blood:1948; IV Piggyback:1000] Out: 2458 [Urine:15; Emesis/NG output:75; Other:3254]     Physical Exam: General: NAD, laying in bed  HEENT Eyes closed, moist mucus membranes  Neck supple  Pulm/lungs Decreased breath sounds at bases, normal resp effort  CVS/Heart S1S2 no rubs  Abdomen:  Soft, non tender, BS present  Extremities: +++ anasarca  Neurologic: arousable but not following commands  Skin: + edema, rt groin cellulitis   Access: Temp cath- rt femoral       Basic Metabolic Panel:  Recent Labs Lab 09/30/14 0436  09/30/14 1345  10/01/14 0611 10/01/14 1211 10/01/14 1606 10/01/14 2303 10/02/14 0151 10/02/14 0633 10/02/14 1030 10/02/14 1540 10/03/14 0558  NA  --   < > 133*  < > 132* 133* 137 135  --  133* 135 135 137  K  --   < > 4.3  < > 4.2 4.6 4.6 4.5  --  4.5 4.7 5.1 3.6  CL  --   < > 101  < > 99* 100* 99* 101  --  102 100* 101 104  CO2  --   < > 25  < > 25 25 26 25   --  25 26 26 26   GLUCOSE  --   < > 111*  < > 122* 142* 156* 163*  --  184* 176* 181* 163*  BUN  --   < > 16  < > 18 20 21* 22*  --  25* 27* 27* 23*  CREATININE  --   < > 1.01*  < > 1.15* 1.14* 1.16* 1.21*  --  1.16* 1.14* 1.19* 0.75  CALCIUM  --   < > 7.9*  < > 8.0* 8.0* 8.3* 8.2*  --  8.2* 8.3*  8.3* 8.2*  MG 1.6*  --  1.8  --  1.8  --   --   --  1.7  --   --   --  1.5*  PHOS  --   < > 1.9*  < > 2.1* 2.3* 2.7 2.5  --  2.3*  --  3.1  --   < > = values in this interval not displayed.   CBC:  Recent Labs Lab 09/30/14 0836 10/01/14 0611 10/02/14 0633 10/02/14 1030 10/03/14 0558  WBC 32.7* 35.4* 26.0* 24.6* 17.2*  NEUTROABS  --   --   --  21.1*  --   HGB 10.0* 8.8* 6.1* 5.9* 7.9*  HCT 31.3* 27.4* 19.1* 18.6* 23.9*  MCV 88.9 88.6 88.7 88.6 86.7  PLT 149* 105* 74* 68* 47*      Microbiology: Results for orders placed or performed during the hospital encounter of 09/14/2014  Blood culture (routine x 2)  Status: None   Collection Time: 09/08/2014 12:45 AM  Result Value Ref Range Status   Specimen Description BLOOD  Final   Special Requests NONE  Final   Culture  Setup Time   Final    GRAM POSITIVE COCCI IN CHAINS IN BOTH AEROBIC AND ANAEROBIC BOTTLES CRITICAL RESULT CALLED TO, READ BACK BY AND VERIFIED WITH: PAM CRAWFORD AT 1406 09/26/2014 DV    Culture STREPTOCOCCUS SPECIES  Final   Report Status 10/01/2014 FINAL  Final   Organism ID, Bacteria STREPTOCOCCUS SPECIES  Final      Susceptibility   Streptococcus species - MIC (ETEST)*    ERYTHROMYCIN Value in next row Resistant      RESISTANT6    PENICILLIN Value in next row Intermediate      INTERMEDIATE0.25    CEFTRIAXONE Value in next row Sensitive      SENSITIVE0.047    VANCOMYCIN Value in next row Sensitive      SENSITIVE1.0    LEVOFLOXACIN Value in next row Sensitive      SENSITIVE1.0    * STREPTOCOCCUS SPECIES  Blood culture (routine x 2)     Status: None   Collection Time: 09/25/2014 12:45 AM  Result Value Ref Range Status   Specimen Description BLOOD  Final   Special Requests NONE  Final   Culture  Setup Time   Final    GRAM POSITIVE COCCI IN CHAINS IN BOTH AEROBIC AND ANAEROBIC BOTTLES CRITICAL RESULT CALLED TO, READ BACK BY AND VERIFIED WITH: PAM CRAWFORD AT 1406 09/04/2014 DV    Culture   Final     STREPTOCOCCUS SPECIES REFER TO ACCESSION [id number] COLLECTED ON 09/27/2014 AT 0045 FOR SENSITIVITIES    Report Status 09/30/2014 FINAL  Final    Coagulation Studies:  Recent Labs  10/01/14 2303 10/02/14 0151 10/03/14 0558  LABPROT 52.2* 52.4* 27.5*  INR 5.86* 5.87* 2.55    Urinalysis: No results for input(s): COLORURINE, LABSPEC, PHURINE, GLUCOSEU, HGBUR, BILIRUBINUR, KETONESUR, PROTEINUR, UROBILINOGEN, NITRITE, LEUKOCYTESUR in the last 72 hours.  Invalid input(s): APPERANCEUR    Imaging: Ct Abdomen Pelvis Wo Contrast  10/01/2014   CLINICAL DATA:  Right flank cellulitis and edema.  EXAM: CT ABDOMEN AND PELVIS WITHOUT CONTRAST  TECHNIQUE: Multidetector CT imaging of the abdomen and pelvis was performed following the standard protocol without IV contrast.  COMPARISON:  08/31/2014  FINDINGS: Lower chest: Small bilateral pleural effusions are noted left greater than right. There is airspace consolidation noted within both lung bases. Patchy ground-glass and airspace opacity within the medial left upper lobe is noted, image number 3/series 4.  Hepatobiliary: There is no suspicious liver abnormality identified. Increased attenuation within the gallbladder may Roux represent stones and/or sludge. No biliary dilatation.  Pancreas: Normal appearance of the pancreas.  Spleen: The spleen is on unremarkable.  Adrenals/Urinary Tract: Normal appearance of the left adrenal gland. Similar appearance of low-attenuation right adrenal gland nodule measuring 2.9 cm, image 30/series 2. The kidneys are both on unremarkable. The urinary bladder is collapsed around a Foley catheter balloon.  Stomach/Bowel: Moderate distension of the stomach containing fluid level. The small bowel loops have a normal course and caliber. Normal appearance of the colon.  Vascular/Lymphatic: There is a right femoral dialysis catheter with tip in the IVC. Abdominal aorta has a normal caliber. No adenopathy.  Reproductive: Previous  hysterectomy.  No adnexal mass.  Other: Moderate free fluid is identified within the abdomen and pelvis. There is diffuse body wall edema and skin thickening. No focal fluid  collection identified to suggest abscess.  Musculoskeletal: Degenerative disc disease noted within the lumbar spine.  IMPRESSION: 1. Bilateral pleural effusions, ascites and anasarca is identified consistent with fluid overload state. 2. Subcutaneous fat stranding and skin thickening may be related to anasarca or cellulitis. 3. No fluid collections identified. 4. Left lung opacities may reflect pneumonia and/r or areas of asymmetric edema.   Electronically Signed   By: Kerby Moors M.D.   On: 10/01/2014 15:17   Dg Chest Port 1 View  10/03/2014   CLINICAL DATA:  Central line adjustment.  EXAM: PORTABLE CHEST - 1 VIEW  COMPARISON:  10/03/2014 at 10:18 a.m.  FINDINGS: Right sided central line curls overlying the right neck. This does not enter the thorax. It is not changed from the earlier study.  No change in the appearance of the lungs.  No pneumothorax.  IMPRESSION: Right central line is seen curling over the neck, not entering thorax and not changed from the prior study.   Electronically Signed   By: Lajean Manes M.D.   On: 10/03/2014 12:13   Dg Chest Port 1 View  10/03/2014   CLINICAL DATA:  Central line placement  EXAM: PORTABLE CHEST - 1 VIEW  COMPARISON:  None.  FINDINGS: Cardiomediastinal silhouette is stable. Patchy airspace is right upper lobe left midlung and left lower lobe again noted suspicious for multifocal pneumonia. There is only partially visualized tortuous central line in right neck region. Clinical correlation is necessary. There is no pneumothorax.  IMPRESSION: Again noted patchy airspace disease bilaterally suspicious for multifocal pneumonia. Tortuous only partially visualized central line in right neck region. Probable significant retracted right IJ line. Clinical correlation is necessary. These results were  called by telephone at the time of interpretation on 10/03/2014 at 10:53 am to ICU. Nurse Sheryn Bison , who verbally acknowledged these results.   Electronically Signed   By: Lahoma Crocker M.D.   On: 10/03/2014 10:55   Dg Chest Port 1 View  10/02/2014   CLINICAL DATA:  Shortness of breath, acute onset. Initial encounter.  EXAM: PORTABLE CHEST - 1 VIEW  COMPARISON:  Chest radiograph performed 09/11/2014  FINDINGS: The lungs are hypoexpanded. Patchy bilateral airspace opacities are noted, raising concern for multifocal pneumonia. Underlying asymmetric interstitial edema on the left side cannot be excluded. A small left pleural effusion is suspected. No pneumothorax is seen.  The cardiomediastinal silhouette is mildly enlarged. No acute osseous abnormalities are seen. A right IJ line is noted ending overlying the left brachiocephalic vein.  IMPRESSION: 1. Lungs hypoexpanded. Patchy bilateral airspace opacities raise concern for multifocal pneumonia. 2. Underlying asymmetric interstitial edema on the left side cannot be excluded. Suspect small left pleural effusion. 3. Mild cardiomegaly noted. 4. Right IJ line noted ending overlying the left brachiocephalic vein, as noted on the prior study. It appears to have been retracted mildly since the prior study.   Electronically Signed   By: Garald Balding M.D.   On: 10/02/2014 20:31     Medications:   . Marland KitchenTPN (CLINIMIX-E) Adult Stopped (10/03/14 1130)  . Marland KitchenTPN (CLINIMIX-E) Adult    . norepinephrine (LEVOPHED) Adult infusion 23.04 mcg/min (10/03/14 1130)  . phenylephrine (NEO-SYNEPHRINE) Adult infusion Stopped (10/02/14 1100)  . pureflow 1,000 mL/hr at 10/02/14 2315  . vasopressin (PITRESSIN) infusion - *FOR SHOCK* 0.03 Units/min (10/03/14 1130)   . acetaminophen  650 mg Oral Daily  . albumin human  25 g Intravenous Q6H  . antiseptic oral rinse  7 mL Mouth Rinse q12n4p  .  cefTRIAXone (ROCEPHIN)  IV  2 g Intravenous Q24H  . chlorhexidine  15 mL Mouth Rinse BID  .  clindamycin (CLEOCIN) IV  600 mg Intravenous 3 times per day  . hydrocortisone sod succinate (SOLU-CORTEF) inj  50 mg Intravenous Q12H  . IMMUNE GLOBULIN 10% (HUMAN) IV - For Fluid Restriction Only  500 mg/kg Intravenous Q24H  . insulin aspart  0-9 Units Subcutaneous 4 times per day  . lactulose  20 g Oral BID  . midazolam      . midodrine  10 mg Oral TID  . pantoprazole (PROTONIX) IV  80 mg Intravenous Q12H  . potassium chloride  10 mEq Intravenous Q1 Hr x 4   acetaminophen **OR** acetaminophen, albuterol, heparin, morphine injection, ondansetron **OR** ondansetron (ZOFRAN) IV, polyethylene glycol powder  Assessment/ Plan:   64 y.o. African [place] female with diabetes mellitus type 2, hypertension, morbid obesity, osteoarthritis, status post right knee surgery, nephrotic syndrome with edema, hypoalbuminemia, renal insufficiency and proteinuria.  1. ARF with CKD st 3 . Baseline cr 1.3. (08/2014)  2. Nephrotic Syndrome with proteinuria: Renal Amyloidosis.  3. Generalized Edema 4. Hypoalbuminemia 5. Right groin cellulitis /sepsis- strep species  6. Diabetes Mellitus type II 7. Hypotension  Plan: Patient remains oligoanuric at this time and continues to have considerable edema.  Will continue CRRT with current parameters.  UF target 2.4L/day.  Will also continue albumin for now.  Pt also now on TPN to help improve nutritional status.  Albumin remains quite low.  Pt also on IVIG.  Abx therapy per hospitalist and pharmacy.  Overall prognosis remains guarded at present.     LOS: 5 Landa Mullinax 5/30/201612:26 PM

## 2014-10-03 NOTE — Progress Notes (Signed)
[facility] [place] NAME: [name]    MR#:  [id number]  DATE OF BIRTH:  [birth year]  SUBJECTIVE:  CHIEF COMPLAINT:   Chief Complaint  Patient presents with  . Altered Mental Status    Critically ill. Now on 2 pressors. Confused. 2 L O2. Did not tolerate the Bipap. No urine. Getting TPN.   REVIEW OF SYSTEMS:    ROS  Unable to give history due to encephalopathy     DRUG ALLERGIES:  No Known Allergies  VITALS:  Blood pressure 86/63, pulse 90, temperature 97.9 F (36.6 C), temperature source Oral, resp. rate 23, height 5\' 1"  (1.549 m), weight 100.7 kg (222 lb 0.1 oz), SpO2 100 %.  PHYSICAL EXAMINATION:   Physical Exam  GENERAL:  64 y.o.-year-old patient lying in the bed drowzy. EYES: Pupils equal, round, reactive to light and accommodation. No scleral icterus. Extraocular muscles intact. HEENT: Head atraumatic, normocephalic. Oropharynx and nasopharynx clear. NECK:  Supple, no jugular venous distention. No thyroid enlargement, no tenderness. Right IJ TL. LUNGS: Normal breath sounds bilaterally, no wheezing, rales, rhonchi. No use of accessory muscles of respiration. CARDIOVASCULAR: S1, S2 normal. No murmurs, rubs, or gallops. ABDOMEN: Soft, nontender, nondistended. Bowel sounds present. No organomegaly or mass. EXTREMITIES: No cyanosis, clubbing. Anasarca. Open superficial ulcer right lateral thigh. NEUROLOGIC: Drowzy.lethargic.  PSYCHIATRIC: The patient is drowzy. SKIN:  Erythema with warmth right abdominal wall, groin,right lateral thigh. Superficial ulcer right thigh.  LABORATORY PANEL:   CBC  Recent Labs Lab 10/03/14 0558  WBC 17.2*  HGB 7.9*  HCT 23.9*  PLT 47*   ------------------------------------------------------------------------------------------------------------------  Chemistries   Recent Labs Lab 10/03/14 0558  NA 137  K 3.6  CL 104  CO2 26  GLUCOSE 163*  BUN 23*  CREATININE  0.75  CALCIUM 8.2*  MG 1.5*  AST 78*  ALT 27  ALKPHOS 233*  BILITOT 12.1*   ------------------------------------------------------------------------------------------------------------------  Cardiac Enzymes No results for input(s): TROPONINI in the last 168 hours. ------------------------------------------------------------------------------------------------------------------  RADIOLOGY:  Ct Abdomen Pelvis Wo Contrast  10/01/2014   CLINICAL DATA:  Right flank cellulitis and edema.  EXAM: CT ABDOMEN AND PELVIS WITHOUT CONTRAST  TECHNIQUE: Multidetector CT imaging of the abdomen and pelvis was performed following the standard protocol without IV contrast.  COMPARISON:  08/31/2014  FINDINGS: Lower chest: Small bilateral pleural effusions are noted left greater than right. There is airspace consolidation noted within both lung bases. Patchy ground-glass and airspace opacity within the medial left upper lobe is noted, image number 3/series 4.  Hepatobiliary: There is no suspicious liver abnormality identified. Increased attenuation within the gallbladder may Roux represent stones and/or sludge. No biliary dilatation.  Pancreas: Normal appearance of the pancreas.  Spleen: The spleen is on unremarkable.  Adrenals/Urinary Tract: Normal appearance of the left adrenal gland. Similar appearance of low-attenuation right adrenal gland nodule measuring 2.9 cm, image 30/series 2. The kidneys are both on unremarkable. The urinary bladder is collapsed around a Foley catheter balloon.  Stomach/Bowel: Moderate distension of the stomach containing fluid level. The small bowel loops have a normal course and caliber. Normal appearance of the colon.  Vascular/Lymphatic: There is a right femoral dialysis catheter with tip in the IVC. Abdominal aorta has a normal caliber. No adenopathy.  Reproductive: Previous hysterectomy.  No adnexal mass.  Other: Moderate free fluid is identified within the abdomen and pelvis. There is  diffuse body wall edema and skin thickening. No focal fluid collection identified to suggest abscess.  Musculoskeletal: Degenerative disc disease noted within the lumbar spine.  IMPRESSION: 1. Bilateral pleural effusions, ascites and anasarca is identified consistent with fluid overload state. 2. Subcutaneous fat stranding and skin thickening may be related to anasarca or cellulitis. 3. No fluid collections identified. 4. Left lung opacities may reflect pneumonia and/r or areas of asymmetric edema.   Electronically Signed   By: Kerby Moors M.D.   On: 10/01/2014 15:17   Dg Chest Port 1 View  10/02/2014   CLINICAL DATA:  Shortness of breath, acute onset. Initial encounter.  EXAM: PORTABLE CHEST - 1 VIEW  COMPARISON:  Chest radiograph performed 09/18/2014  FINDINGS: The lungs are hypoexpanded. Patchy bilateral airspace opacities are noted, raising concern for multifocal pneumonia. Underlying asymmetric interstitial edema on the left side cannot be excluded. A small left pleural effusion is suspected. No pneumothorax is seen.  The cardiomediastinal silhouette is mildly enlarged. No acute osseous abnormalities are seen. A right IJ line is noted ending overlying the left brachiocephalic vein.  IMPRESSION: 1. Lungs hypoexpanded. Patchy bilateral airspace opacities raise concern for multifocal pneumonia. 2. Underlying asymmetric interstitial edema on the left side cannot be excluded. Suspect small left pleural effusion. 3. Mild cardiomegaly noted. 4. Right IJ line noted ending overlying the left brachiocephalic vein, as noted on the prior study. It appears to have been retracted mildly since the prior study.   Electronically Signed   By: Garald Balding M.D.   On: 10/02/2014 20:31     ASSESSMENT AND PLAN:   64 year old African American female history of nephrotic syndrome, amyloidosis, essential hypertension presenting with altered mental status/confusion after apparently falling out of bed. Admitted for septic  shock  * Septic shock with right flank/thigh cellulitis with streptococcus bacteremia Changed to Ceftriaxone and clindamycin. No improvement inspite of aggressive care. Echo-No vegetations. Still on 3 pressors. Checked IgG level and are low as expected. Discussed with Georgeanne Nim with Oncology regarding replacement.  * Acute renal failure over CKD3 due to ATN. From septic shock. On CRRT. Appreciate nephrology help. Severe hypoalbuminemia. Getting IV albumin. Will likely progress to ESRD.  * Acute liver failure- Due to sepsis Ct abd showed no biliary dilation or obstruction  * Elevated INR Due to liver failure. DIC unlikely per oncology.  * Acute encephalopathy - Due to acute illness. CT head nothing acute.  * Amyloidosis Etiology unknonw. Discussed with Dr. Grayland Ormond. Plan was for chemo as OP but now on hold due to acute illness.  * Acute anemia over AOCD Likely from slow GI bleed or hemolysis. Hematology and GI on board. On protonix. Luminal evaluation cannot be done with high INR and acute illness. Transfused 2 units yesterday  * Type 2 diabetes, uncomplicated: Hold oral agents, at insulin slide scale every 6 hours Accu-Chek  * Essential hypertension: Held all meds  * Venous thromboembolism prophylactic: Heparin subcutaneous stopped due to high INR and low platelets  * Nutrition NG would be risky with high INR for bleeding. Started TPN   All the records are reviewed and case discussed with Care Management/Social Workerr. Management plans discussed with the patient, family and they are in agreement.  CODE STATUS: FULL CODE  Dicussed with daughter in law over the phone. Poor prognosis.   DVT Prophylaxis: Heparin discontinued due to INR/platelets  TOTAL CRITICAL CARE TIME TAKING CARE OF THIS PATIENT: 40 minutes.    Hillary Bow R M.D on 10/03/2014 at 9:35 AM  Between 7am to 6pm - Pager - [phone]  After 6pm go to  [email or web address] - password [email or web address]  [facility]  Office  [phone]  CC: Primary care physician; [name], MD     Significant bilirubinemia with mostly direct. Will get Ct abdomen for any abdominal source of infection and also consult GI.

## 2014-10-03 NOTE — Progress Notes (Signed)
Date: 10/03/2014,   MRN# [id number] [name] 1950/12/22 Code Status:     Code Status Orders        Start     Ordered   09/20/2014 0307  Full code   Continuous     09/20/2014 0307     Hosp day:@LENGTHOFSTAYDAYS @ Referring MD: @ATDPROV @     PCP:     CC: vent mangement  HPI: pt see full note to follow  PMHX:   Past Medical History  Diagnosis Date  . HTN (hypertension)   . Diabetes mellitus type II   . Obesity   . Arthritis   . Chronic kidney disease   . Amyloidosis    Surgical Hx:  Past Surgical History  Procedure Laterality Date  . Total abdominal hysterectomy  10/1999    fibroids  . Combined abdominoplasty and liposuction  07/2000  . Total knee arthroplasty Right 09/28/2013    Procedure: RIGHT TOTAL KNEE ARTHROPLASTY;  Surgeon: Mauri Pole, MD;  Location: [facility];  Service: Orthopedics;  Laterality: Right;   Family Hx:  Family History  Problem Relation Age of Onset  . Arthritis Mother   . Hypertension Mother   . Cancer Mother     breast, lung  . Diabetes Mother   . Hypertension Father    Social Hx:   History  Substance Use Topics  . Smoking status: Never Smoker   . Smokeless tobacco: Never Used  . Alcohol Use: No   Medication:    Home Medication:  No current outpatient prescriptions on file.  Current Medication: @CURMEDTAB @   Allergies:  Review of patient's allergies indicates no known allergies.  Review of Systems: Gen:  Denies  fever, sweats, chills HEENT: Denies blurred vision, double vision, ear pain, eye pain, hearing loss, nose bleeds, sore throat Cvc:  No dizziness, chest pain or heaviness Resp:    Gi: Denies swallowing difficulty, stomach pain, nausea or vomiting, diarrhea, constipation, bowel incontinence Gu:  Denies bladder incontinence, burning urine Ext:   No Joint pain, stiffness or swelling Skin: No skin rash, easy bruising or bleeding or hives Endoc:  No polyuria, polydipsia , polyphagia or weight change Psych: No  depression, insomnia or hallucinations  Other:  All other systems negative  Physical Examination:   VS: BP 118/71 mmHg  Pulse 82  Temp(Src) 97.9 F (36.6 C) (Oral)  Resp 22  Ht 5\' 1"  (1.549 m)  Wt 222 lb 0.1 oz (100.7 kg)  BMI 41.97 kg/m2  SpO2 98%  General Appearance: No distress  Neuro: without focal findings, mental status, speech normal, alert and oriented, cranial nerves 2-12 intact, reflexes normal and symmetric, sensation grossly normal  HEENT: PERRLA, EOM intact, no ptosis, no other lesions noticed, Mallampati: Pulmonary:.No wheezing, No rales  Sputum Production:   Cardiovascular:  Normal S1,S2.  No m/r/g.  Abdominal aorta pulsation normal.    Abdomen:Benign, Soft, non-tender, No masses, hepatosplenomegaly, No lymphadenopathy Endoc: No evident thyromegaly, no signs of acromegaly or Cushing features Skin:   warm, no rashes, no ecchymosis  Extremities: normal, no cyanosis, clubbing, no edema, warm with normal capillary refill. Other findings:   Labs results:   Recent Labs     10/01/14  0611   10/01/14  2303  10/02/14  0151  10/02/14  0633  10/02/14  1030  10/02/14  1540  10/03/14  0558  HGB  8.8*   --    --    --   6.1*  5.9*   --   7.9*  HCT  27.4*   --    --    --   19.1*  18.6*   --   23.9*  MCV  88.6   --    --    --   88.7  88.6   --   86.7  WBC  35.4*   --    --    --   26.0*  24.6*   --   17.2*  BUN  18   < >  22*   --   25*  27*  27*  23*  CREATININE  1.15*   < >  1.21*   --   1.16*  1.14*  1.19*  0.75  GLUCOSE  122*   < >  163*   --   184*  176*  181*  163*  CALCIUM  8.0*   < >  8.2*   --   8.2*  8.3*  8.3*  8.2*  INR   --    --   5.86*  5.87*   --    --    --   2.55   < > = values in this interval not displayed.  ,    No results for input(s): PH in the last 72 hours.  Invalid input(s): PCO2, PO2, BASEEXCESS, BASEDEFICITE, TFT  Culture results:     Rad results:   Dg Chest Port 1 View  10/03/2014   CLINICAL DATA:  Orogastric and endotracheal  tube placement.  EXAM: PORTABLE CHEST - 1 VIEW  COMPARISON:  10/03/2014 at 12:51 p.m.  FINDINGS: Orogastric tube passes below the diaphragm to curl within a mostly decompressed stomach.  Endotracheal tube tip projects the right mainstem bronchus.  Patchy airspace consolidation is noted in the peripheral right upper mid and lower lung and inferior to the right hilum.  Hazy left sided opacity noted previously has mostly resolved. There is mild residual opacity at the left lung base likely atelectasis. There is no pulmonary edema.  No pneumothorax.  IMPRESSION: 1. Endotracheal tube is now positioned. Tip is in the right mainstem bronchus. It will need to be retracted 3 cm for optimal positioning. Critical Value/emergent results were called by telephone at the time of interpretation on 10/03/2014 at 5:25 pm to the nurse in charge of this patient, who verbally acknowledged these results and will relay these results to respiratory. 2. Persistent right lung consolidation. Improved left lung aeration. 3. Orogastric tube well positioned curling within a mostly decompressed stomach.   Electronically Signed   By: Lajean Manes M.D.   On: 10/03/2014 17:26   Dg Chest Port 1 View  10/03/2014   CLINICAL DATA:  Status post central line adjustment  EXAM: PORTABLE CHEST - 1 VIEW  COMPARISON:  10/03/2014  FINDINGS: Cardiac shadow is stable. Increased density is noted over the left hemi thorax consistent with a posterior fusion. The previously seen central line is been repositioned and now lies within the proximal superior vena cava. No pneumothorax is noted. Patchy changes are seen within the lungs.  IMPRESSION: Patchy infiltrative changes are again identified bilaterally. Diffuse increased density is noted on the left which may be related to an evolving effusion. The central line has been repositioned without evidence of pneumothorax.   Electronically Signed   By: Inez Catalina M.D.   On: 10/03/2014 13:19   Dg Chest Port 1  View  10/03/2014   CLINICAL DATA:  Central line adjustment.  EXAM: PORTABLE CHEST - 1 VIEW  COMPARISON:  10/03/2014 at 10:18 a.m.  FINDINGS: Right sided central line curls overlying the right neck. This does not enter the thorax. It is not changed from the earlier study.  No change in the appearance of the lungs.  No pneumothorax.  IMPRESSION: Right central line is seen curling over the neck, not entering thorax and not changed from the prior study.   Electronically Signed   By: Lajean Manes M.D.   On: 10/03/2014 12:13   Dg Chest Port 1 View  10/03/2014   CLINICAL DATA:  Central line placement  EXAM: PORTABLE CHEST - 1 VIEW  COMPARISON:  None.  FINDINGS: Cardiomediastinal silhouette is stable. Patchy airspace is right upper lobe left midlung and left lower lobe again noted suspicious for multifocal pneumonia. There is only partially visualized tortuous central line in right neck region. Clinical correlation is necessary. There is no pneumothorax.  IMPRESSION: Again noted patchy airspace disease bilaterally suspicious for multifocal pneumonia. Tortuous only partially visualized central line in right neck region. Probable significant retracted right IJ line. Clinical correlation is necessary. These results were called by telephone at the time of interpretation on 10/03/2014 at 10:53 am to ICU. Nurse Sheryn Bison , who verbally acknowledged these results.   Electronically Signed   By: Lahoma Crocker M.D.   On: 10/03/2014 10:55   Dg Chest Port 1 View  10/02/2014   CLINICAL DATA:  Shortness of breath, acute onset. Initial encounter.  EXAM: PORTABLE CHEST - 1 VIEW  COMPARISON:  Chest radiograph performed 09/19/2014  FINDINGS: The lungs are hypoexpanded. Patchy bilateral airspace opacities are noted, raising concern for multifocal pneumonia. Underlying asymmetric interstitial edema on the left side cannot be excluded. A small left pleural effusion is suspected. No pneumothorax is seen.  The cardiomediastinal silhouette is  mildly enlarged. No acute osseous abnormalities are seen. A right IJ line is noted ending overlying the left brachiocephalic vein.  IMPRESSION: 1. Lungs hypoexpanded. Patchy bilateral airspace opacities raise concern for multifocal pneumonia. 2. Underlying asymmetric interstitial edema on the left side cannot be excluded. Suspect small left pleural effusion. 3. Mild cardiomegaly noted. 4. Right IJ line noted ending overlying the left brachiocephalic vein, as noted on the prior study. It appears to have been retracted mildly since the prior study.   Electronically Signed   By: Garald Balding M.D.   On: 10/02/2014 20:31   Dg Abd Portable 1v  10/03/2014   CLINICAL DATA:  OG tube placement  EXAM: PORTABLE ABDOMEN - 1 VIEW  COMPARISON:  CT abdomen pelvis dated 09/23/2014  FINDINGS: Enteric tube terminates in the gastric cardia.  Nonobstructive bowel gas pattern.  IMPRESSION: Enteric tube terminates in the gastric cardia.   Electronically Signed   By: Julian Hy M.D.   On: 10/03/2014 17:22      Assessment and Plan: Impending resp failure, pneumonia, pulm edema, increase wob, fatigue, hence intubated, cxr called, serial abgs, cxr, ck phos, mag, continue crrt.   I have personally obtained a history, examined the patient, evaluated laboratory and imaging results, formulated the assessment and plan and placed orders.  The Patient requires high complexity decision making for assessment and support, frequent evaluation and titration of therapies, application of advanced monitoring technologies and extensive interpretation of multiple databases.   Herbon Fleming,M.D. Pulmonary & Critical care Medicine [facility]

## 2014-10-03 NOTE — Progress Notes (Signed)
Pt right IJ line has migrated back (no change from yesterday).  CXR was obtained last night, Dr Darvin Neighbours reviewed CXR and per Dr. Darvin Neighbours line is in adequate position and may be used for iv infusions, line does give blood return.  Continue to monitor line position.  Per Dr. Darvin Neighbours may obtain CXR this afternoon to follow up on line position.

## 2014-10-03 NOTE — Progress Notes (Signed)
Spoke with Dr. Holley Raring regarding pt serum potassium of 2.9.  Per Dr. Holley Raring change dialysate to 4K bath, and give iv replacement (will give 30 mEq as pt received 10 mEq earlier in shift, but unable to complete due to iv access).

## 2014-10-03 NOTE — Progress Notes (Signed)
Nutrition Follow-up  DOCUMENTATION CODES:     INTERVENTION:   (Parenteral Nutrition) Spoke with Dr. Darvin Neighbours and agreeable to increasing TPN to 81ml/hr today(pt received blood today), goal rate will be 35ml/hr to meet nutritional needs.   NUTRITION DIAGNOSIS:  Inadequate oral intake related to inability to eat as evidenced by NPO status.    GOAL:  Patient will meet greater than or equal to 90% of their needs (Goal for tolerance of initiation of TPN, titration within 24-48hours)    MONITOR:   (PN, Electrolyte and Renal Profile, Digestive System, Glucose Profile, Anthropometrics, UOP )  REASON FOR ASSESSMENT:  Consult New TPN/TNA  ASSESSMENT:  Pt remains on CRRT  Medications: levophed and vasopressin  Electrolyte and Renal Profile:    Recent Labs Lab 10/01/14 0611  10/01/14 2303 10/02/14 0151 10/02/14 0633 10/02/14 1030 10/02/14 1540 10/03/14 0558  BUN 18  < > 22*  --  25* 27* 27* 23*  CREATININE 1.15*  < > 1.21*  --  1.16* 1.14* 1.19* 0.75  NA 132*  < > 135  --  133* 135 135 137  K 4.2  < > 4.5  --  4.5 4.7 5.1 3.6  MG 1.8  --   --  1.7  --   --   --  1.5*  PHOS 2.1*  < > 2.5  --  2.3*  --  3.1  --   < > = values in this interval not displayed.   Height:  Ht Readings from Last 1 Encounters:  10/04/2014 5\' 1"  (1.549 m)    Weight:  Wt Readings from Last 1 Encounters:  10/03/14 222 lb 0.1 oz (100.7 kg)    Ideal Body Weight:     Wt Readings from Last 10 Encounters:  10/03/14 222 lb 0.1 oz (100.7 kg)  09/20/14 216 lb 4 oz (98.09 kg)  09/13/14 210 lb 15.7 oz (95.7 kg)  07/06/14 216 lb 12.8 oz (98.34 kg)  06/24/14 216 lb 8 oz (98.204 kg)  06/07/14 209 lb 12.8 oz (95.165 kg)  03/16/14 194 lb 8 oz (88.225 kg)  01/14/14 197 lb 12 oz (89.699 kg)  01/11/14 193 lb 12 oz (87.884 kg)  09/28/13 203 lb (92.08 kg)    BMI:  Body mass index is 41.97 kg/(m^2).  Estimated Nutritional Needs:  Kcal:  BEE 962 kcals (IF 1.0-1.2, AF 1.3)  1250-1500 kcals/d  Using IBW of 48kg  Protein:  1.0-1.2 g/d (48-58 g/d) Using IBW of 48kg  Fluid:  (25-43ml/kg) 1200-1480ml/d  Skin:  Reviewed, no issues  Diet Order:  Diet NPO time specified .TPN (CLINIMIX-E) Adult .TPN (CLINIMIX-E) Adult  EDUCATION NEEDS:  No education needs identified at this time   Intake/Output Summary (Last 24 hours) at 10/03/14 1256 Last data filed at 10/03/14 1130  Gross per 24 hour  Intake 3181.9 ml  Output   1691 ml  Net 1490.9 ml    Last BM: 5/25  HIGH Care Level Vonya Ohalloran B. Zenia Resides, [place], [place] (pager)

## 2014-10-03 NOTE — Progress Notes (Signed)
Pt remains on CRRT, tolerating 100 ml/hr ultrafiltration.  This morning right IJ was leaking and in malposition.  IV infusions were stopped through line and new line placed (old line removed) in right IJ by Dr. Trula Slade.  New line verified with x-ray.  Pt abg hypoxic, required more O2 throughout afternoon with restlessness (unable to tolerate bipap), then with increased work of breathing, therefore intubated.  OG tube placed and put on suction, 600 ml black drainage from OG, dr Volanda Napoleon aware.  Follow up hemoglobin drawn, awaiting results.  REmains on levophed and vasopressin to keep map >60.  Pt potassium 2.9, dialysate changed to 4K bath.  Family aware and updated.

## 2014-10-03 NOTE — Progress Notes (Signed)
Dressing to right lateral thigh removed. Wound with moderate amount of bleeding, but not oozing. Site covered with Vaseline gauze, 4 x 4 guaze and abd pads, secured with paper tape. Site has large amount of serous drainage throughout the night, with pads being changed multiple times.

## 2014-10-03 NOTE — Op Note (Signed)
    Patient name: [name] MRN: [id number] DOB: [birth year] Sex: female   Pre-operative Diagnosis: central line malfunction Post-operative diagnosis:  Same Surgeon:  Annamarie Major Assistants:  none Procedure:   Replacement of central line   Indications:  Patient had central line which had been pulled back.  Needs replacement for IV access  Procedure:  Under sterile conditions, I was able to thread a wire into the existing central line.  Over the wire, I passed a new line without difficulty.  All 3 ports flushed and aspirated.  The line was sutured into position and sterile dressings applied   Disposition:  To PACU in stable condition.   Theotis Burrow, M.D. [facility] Office: [phone] Pager:  [phone]

## 2014-10-04 DIAGNOSIS — N186 End stage renal disease: Secondary | ICD-10-CM

## 2014-10-04 DIAGNOSIS — R400 Somnolence: Secondary | ICD-10-CM

## 2014-10-04 DIAGNOSIS — Z9911 Dependence on respirator [ventilator] status: Secondary | ICD-10-CM

## 2014-10-04 DIAGNOSIS — I120 Hypertensive chronic kidney disease with stage 5 chronic kidney disease or end stage renal disease: Secondary | ICD-10-CM

## 2014-10-04 DIAGNOSIS — D696 Thrombocytopenia, unspecified: Secondary | ICD-10-CM

## 2014-10-04 LAB — RENAL FUNCTION PANEL
ALBUMIN: 3.4 g/dL — AB (ref 3.5–5.0)
Anion gap: 7 (ref 5–15)
BUN: 18 mg/dL (ref 6–20)
CHLORIDE: 102 mmol/L (ref 101–111)
CO2: 28 mmol/L (ref 22–32)
Calcium: 8.7 mg/dL — ABNORMAL LOW (ref 8.9–10.3)
Creatinine, Ser: 0.32 mg/dL — ABNORMAL LOW (ref 0.44–1.00)
GFR calc Af Amer: >60 mL/min (ref >60)
Glucose, Bld: 234 mg/dL — ABNORMAL HIGH (ref 65–99)
Potassium: 4 mmol/L (ref 3.5–5.1)
Sodium: 137 mmol/L (ref 135–145)

## 2014-10-04 LAB — BLOOD GAS, ARTERIAL
ACID-BASE EXCESS: 2.7 mmol/L (ref 0.0–3.0)
ALLENS TEST (PASS/FAIL): POSITIVE — AB
Acid-Base Excess: 1 mmol/L (ref 0.0–3.0)
Allens test (pass/fail): POSITIVE — AB
BICARBONATE: 26.6 meq/L (ref 21.0–28.0)
BICARBONATE: 27.9 meq/L (ref 21.0–28.0)
FIO2: 0.32 %
FIO2: 0.55 %
LHR: 20 {breaths}/min
MECHANICAL RATE: 20
MECHVT: 450 mL
O2 SAT: 83.4 %
O2 Saturation: 90.4 %
PCO2 ART: 45 mmHg (ref 32.0–48.0)
PEEP/CPAP: 5 cmH2O
PO2 ART: 49 mmHg — AB (ref 83.0–108.0)
PO2 ART: 59 mmHg — AB (ref 83.0–108.0)
Patient temperature: 37
Patient temperature: 37
pCO2 arterial: 44 mmHg (ref 32.0–48.0)
pH, Arterial: 7.38 (ref 7.350–7.450)
pH, Arterial: 7.41 (ref 7.350–7.450)

## 2014-10-04 LAB — CBC
HEMATOCRIT: 21.4 % — AB (ref 35.0–47.0)
HEMOGLOBIN: 7.3 g/dL — AB (ref 12.0–16.0)
MCH: 30.1 pg (ref 26.0–34.0)
MCHC: 34.3 g/dL (ref 32.0–36.0)
MCV: 87.8 fL (ref 80.0–100.0)
Platelets: 44 10*3/uL — ABNORMAL LOW (ref 150–440)
RBC: 2.44 MIL/uL — ABNORMAL LOW (ref 3.80–5.20)
RDW: 17.7 % — ABNORMAL HIGH (ref 11.5–14.5)
WBC: 16.7 10*3/uL — AB (ref 3.6–11.0)

## 2014-10-04 LAB — COMPREHENSIVE METABOLIC PANEL
ALBUMIN: 3.7 g/dL (ref 3.5–5.0)
ALT: 29 U/L (ref 14–54)
AST: 77 U/L — AB (ref 15–41)
Alkaline Phosphatase: 274 U/L — ABNORMAL HIGH (ref 38–126)
Anion gap: 8 (ref 5–15)
BUN: 18 mg/dL (ref 6–20)
CO2: 27 mmol/L (ref 22–32)
CREATININE: 0.33 mg/dL — AB (ref 0.44–1.00)
Calcium: 9 mg/dL (ref 8.9–10.3)
Chloride: 103 mmol/L (ref 101–111)
GFR calc Af Amer: >60 mL/min (ref >60)
GFR calc non Af Amer: >60 mL/min (ref >60)
Glucose, Bld: 251 mg/dL — ABNORMAL HIGH (ref 65–99)
Potassium: 4.1 mmol/L (ref 3.5–5.1)
Sodium: 138 mmol/L (ref 135–145)
Total Bilirubin: 20.9 mg/dL (ref 0.3–1.2)
Total Protein: 6.3 g/dL — ABNORMAL LOW (ref 6.5–8.1)

## 2014-10-04 LAB — MAGNESIUM: Magnesium: 1.8 mg/dL (ref 1.7–2.4)

## 2014-10-04 LAB — APTT: APTT: 48 s — AB (ref 24–36)

## 2014-10-04 LAB — GLUCOSE, CAPILLARY
GLUCOSE-CAPILLARY: 214 mg/dL — AB (ref 65–99)
GLUCOSE-CAPILLARY: 238 mg/dL — AB (ref 65–99)
GLUCOSE-CAPILLARY: 248 mg/dL — AB (ref 65–99)
Glucose-Capillary: 227 mg/dL — ABNORMAL HIGH (ref 65–99)
Glucose-Capillary: 214 mg/dL — ABNORMAL HIGH (ref 65–99)
Glucose-Capillary: 221 mg/dL — ABNORMAL HIGH (ref 65–99)
Glucose-Capillary: 209 mg/dL — ABNORMAL HIGH (ref 65–99)

## 2014-10-04 LAB — PHOSPHORUS

## 2014-10-04 LAB — PROTIME-INR
INR: 2.69
PROTHROMBIN TIME: 28.7 s — AB (ref 11.4–15.0)

## 2014-10-04 MED ORDER — INSULIN ASPART 100 UNIT/ML ~~LOC~~ SOLN
0.0000 [IU] | SUBCUTANEOUS | Status: DC
  Administered 2014-10-04 (×4): 3 [IU] via SUBCUTANEOUS
  Administered 2014-10-05 (×2): 2 [IU] via SUBCUTANEOUS
  Administered 2014-10-05: 3 [IU] via SUBCUTANEOUS
  Administered 2014-10-05: 2 [IU] via SUBCUTANEOUS
  Administered 2014-10-05 (×2): 3 [IU] via SUBCUTANEOUS
  Administered 2014-10-06 (×2): 5 [IU] via SUBCUTANEOUS
  Administered 2014-10-06 (×4): 3 [IU] via SUBCUTANEOUS
  Administered 2014-10-07: 2 [IU] via SUBCUTANEOUS
  Administered 2014-10-07 (×2): 3 [IU] via SUBCUTANEOUS
  Filled 2014-10-04: qty 2
  Filled 2014-10-04 (×4): qty 3
  Filled 2014-10-04: qty 5
  Filled 2014-10-04: qty 3
  Filled 2014-10-04: qty 2
  Filled 2014-10-04: qty 5
  Filled 2014-10-04 (×8): qty 3
  Filled 2014-10-04 (×2): qty 2

## 2014-10-04 MED ORDER — VITAMIN K1 10 MG/ML IJ SOLN
10.0000 mg | Freq: Once | INTRAVENOUS | Status: AC
  Administered 2014-10-04: 10 mg via INTRAVENOUS
  Filled 2014-10-04: qty 1

## 2014-10-04 MED ORDER — TRACE MINERALS CR-CU-MN-SE-ZN 10-1000-500-60 MCG/ML IV SOLN
INTRAVENOUS | Status: AC
  Administered 2014-10-04: 19:00:00 via INTRAVENOUS
  Filled 2014-10-04: qty 960

## 2014-10-04 NOTE — Progress Notes (Signed)
Date of Admission:  09/13/2014     ID: [name] is a 64 y.o. female with  [name] and anasarca admitted with sepsis from cellulitis on thigh.  Sturgeon with strep species  Active Problems:   Amyloidosis   Septic shock   Leaking central line catheter   Subjective: Worsening sepsis, intubated, on 3 pressors  Medications:  . acetaminophen  650 mg Oral Daily  . albumin human  25 g Intravenous Q6H  . antiseptic oral rinse  7 mL Mouth Rinse QID  . cefTRIAXone (ROCEPHIN)  IV  2 g Intravenous Q24H  . chlorhexidine  15 mL Mouth Rinse BID  . clindamycin (CLEOCIN) IV  600 mg Intravenous 3 times per day  . hydrocortisone sod succinate (SOLU-CORTEF) inj  50 mg Intravenous Q12H  . insulin aspart  0-9 Units Subcutaneous Q4H  . lactulose  20 g Oral BID  . midodrine  10 mg Oral TID  . pantoprazole (PROTONIX) IV  80 mg Intravenous Q12H  . sodium chloride  10-40 mL Intracatheter Q12H    Objective: Vital signs in last 24 hours: Temp:  [92.3 F (33.5 C)-98.4 F (36.9 C)] 98.4 F (36.9 C) (05/31 1200) Pulse Rate:  [74-94] 93 (05/31 1230) Resp:  [14-22] 20 (05/31 1230) BP: (81-129)/(54-96) 81/57 mmHg (05/31 1230) SpO2:  [86 %-100 %] 99 % (05/31 1230) FiO2 (%):  [45 %-100 %] 45 % (05/31 0801) Weight:  [95.3 kg (210 lb 1.6 oz)] 95.3 kg (210 lb 1.6 oz) (05/31 0458)  GENERAL: intubated, critically ill appearing EYES: Pupils equal, round, reactive to light and accommodation. No scleral icterus. Extraocular muscles intact. HEENT: Head atraumatic, normocephalic. Oropharynx and nasopharynx clear. NECK: Supple, no jugular venous distention. No thyroid enlargement, no tenderness. Right IJ TL. LUNGS: Normal breath sounds bilaterally, no wheezing, rales, rhonchi. No use of accessory muscles of respiration. CARDIOVASCULAR: S1, S2 normal. No murmurs, rubs, or gallops. ABDOMEN: Soft, nontender, nondistended. Bowel sounds present. No organomegaly or mass. EXTREMITIES: No cyanosis, clubbing.  Anasarca. Superficial ulcer right lateral thigh.  NEUROLOGIC: sedated PSYCHIATRIC: The patient is sedated SKIN: Erythema with warmth right abdominal wall, groin,right lateral thigh. Superficial ulcer right thigh. Decreased erythema  Lab Results  Recent Labs  10/02/14 1030  10/03/14 0558 10/03/14 1736 10/03/14 2308  WBC 24.6*  --  17.2*  --   --   HGB 5.9*  --  7.9* 7.9*  --   HCT 18.6*  --  23.9*  --   --   NA 135  < > 137 127* 137  K 4.7  < > 3.6 2.9* 3.8  CL 100*  < > 104 97* 101  CO2 26  < > 26 25 26   BUN 27*  < > 23* 21* 21*  CREATININE 1.14*  < > 0.75 0.51 0.43*  < > = values in this interval not displayed. Liver Panel  Recent Labs  10/02/14 1030  10/03/14 0558 10/03/14 1736 10/03/14 2308  PROT 4.8*  --  4.9*  --   --   ALBUMIN 2.8*  < > 3.1* 3.1* 3.4*  AST 94*  --  78*  --   --   ALT 29  --  27  --   --   ALKPHOS 319*  --  233*  --   --   BILITOT 8.9*  --  12.1*  --   --   < > = values in this interval not displayed.  Microbiology: Results for orders placed or performed during the hospital encounter  of 09/19/2014  Blood culture (routine x 2)     Status: None   Collection Time: 09/25/2014 12:45 AM  Result Value Ref Range Status   Specimen Description BLOOD  Final   Special Requests NONE  Final   Culture  Setup Time   Final    GRAM POSITIVE COCCI IN CHAINS IN BOTH AEROBIC AND ANAEROBIC BOTTLES CRITICAL RESULT CALLED TO, READ BACK BY AND VERIFIED WITH: PAM CRAWFORD AT 1406 09/14/2014 DV    Culture STREPTOCOCCUS SPECIES  Final   Report Status 10/01/2014 FINAL  Final   Organism ID, Bacteria STREPTOCOCCUS SPECIES  Final      Susceptibility   Streptococcus species - MIC (ETEST)*    ERYTHROMYCIN Value in next row Resistant      RESISTANT6    PENICILLIN Value in next row Intermediate      INTERMEDIATE0.25    CEFTRIAXONE Value in next row Sensitive      SENSITIVE0.047    VANCOMYCIN Value in next row Sensitive      SENSITIVE1.0    LEVOFLOXACIN Value in next row  Sensitive      SENSITIVE1.0    * STREPTOCOCCUS SPECIES  Blood culture (routine x 2)     Status: None   Collection Time: 09/21/2014 12:45 AM  Result Value Ref Range Status   Specimen Description BLOOD  Final   Special Requests NONE  Final   Culture  Setup Time   Final    GRAM POSITIVE COCCI IN CHAINS IN BOTH AEROBIC AND ANAEROBIC BOTTLES CRITICAL RESULT CALLED TO, READ BACK BY AND VERIFIED WITH: PAM CRAWFORD AT 1406 09/12/2014 DV    Culture   Final    STREPTOCOCCUS SPECIES REFER TO ACCESSION [id number] COLLECTED ON 09/25/2014 AT 0045 FOR SENSITIVITIES    Report Status 09/30/2014 FINAL  Final    Studies/Results: Dg Chest Port 1 View  10/03/2014   CLINICAL DATA:  Orogastric and endotracheal tube placement.  EXAM: PORTABLE CHEST - 1 VIEW  COMPARISON:  10/03/2014 at 12:51 p.m.  FINDINGS: Orogastric tube passes below the diaphragm to curl within a mostly decompressed stomach.  Endotracheal tube tip projects the right mainstem bronchus.  Patchy airspace consolidation is noted in the peripheral right upper mid and lower lung and inferior to the right hilum.  Hazy left sided opacity noted previously has mostly resolved. There is mild residual opacity at the left lung base likely atelectasis. There is no pulmonary edema.  No pneumothorax.  IMPRESSION: 1. Endotracheal tube is now positioned. Tip is in the right mainstem bronchus. It will need to be retracted 3 cm for optimal positioning. Critical Value/emergent results were called by telephone at the time of interpretation on 10/03/2014 at 5:25 pm to the nurse in charge of this patient, who verbally acknowledged these results and will relay these results to respiratory. 2. Persistent right lung consolidation. Improved left lung aeration. 3. Orogastric tube well positioned curling within a mostly decompressed stomach.   Electronically Signed   By: Lajean Manes M.D.   On: 10/03/2014 17:26   Dg Chest Port 1 View  10/03/2014   CLINICAL DATA:  Status post central line  adjustment  EXAM: PORTABLE CHEST - 1 VIEW  COMPARISON:  10/03/2014  FINDINGS: Cardiac shadow is stable. Increased density is noted over the left hemi thorax consistent with a posterior fusion. The previously seen central line is been repositioned and now lies within the proximal superior vena cava. No pneumothorax is noted. Patchy changes are seen within the lungs.  IMPRESSION:  Patchy infiltrative changes are again identified bilaterally. Diffuse increased density is noted on the left which may be related to an evolving effusion. The central line has been repositioned without evidence of pneumothorax.   Electronically Signed   By: Inez Catalina M.D.   On: 10/03/2014 13:19   Dg Chest Port 1 View  10/03/2014   CLINICAL DATA:  Central line adjustment.  EXAM: PORTABLE CHEST - 1 VIEW  COMPARISON:  10/03/2014 at 10:18 a.m.  FINDINGS: Right sided central line curls overlying the right neck. This does not enter the thorax. It is not changed from the earlier study.  No change in the appearance of the lungs.  No pneumothorax.  IMPRESSION: Right central line is seen curling over the neck, not entering thorax and not changed from the prior study.   Electronically Signed   By: Lajean Manes M.D.   On: 10/03/2014 12:13   Dg Chest Port 1 View  10/03/2014   CLINICAL DATA:  Central line placement  EXAM: PORTABLE CHEST - 1 VIEW  COMPARISON:  None.  FINDINGS: Cardiomediastinal silhouette is stable. Patchy airspace is right upper lobe left midlung and left lower lobe again noted suspicious for multifocal pneumonia. There is only partially visualized tortuous central line in right neck region. Clinical correlation is necessary. There is no pneumothorax.  IMPRESSION: Again noted patchy airspace disease bilaterally suspicious for multifocal pneumonia. Tortuous only partially visualized central line in right neck region. Probable significant retracted right IJ line. Clinical correlation is necessary. These results were called by  telephone at the time of interpretation on 10/03/2014 at 10:53 am to ICU. Nurse Sheryn Bison , who verbally acknowledged these results.   Electronically Signed   By: Lahoma Crocker M.D.   On: 10/03/2014 10:55   Dg Chest Port 1 View  10/02/2014   CLINICAL DATA:  Shortness of breath, acute onset. Initial encounter.  EXAM: PORTABLE CHEST - 1 VIEW  COMPARISON:  Chest radiograph performed 09/08/2014  FINDINGS: The lungs are hypoexpanded. Patchy bilateral airspace opacities are noted, raising concern for multifocal pneumonia. Underlying asymmetric interstitial edema on the left side cannot be excluded. A small left pleural effusion is suspected. No pneumothorax is seen.  The cardiomediastinal silhouette is mildly enlarged. No acute osseous abnormalities are seen. A right IJ line is noted ending overlying the left brachiocephalic vein.  IMPRESSION: 1. Lungs hypoexpanded. Patchy bilateral airspace opacities raise concern for multifocal pneumonia. 2. Underlying asymmetric interstitial edema on the left side cannot be excluded. Suspect small left pleural effusion. 3. Mild cardiomegaly noted. 4. Right IJ line noted ending overlying the left brachiocephalic vein, as noted on the prior study. It appears to have been retracted mildly since the prior study.   Electronically Signed   By: Garald Balding M.D.   On: 10/02/2014 20:31   Dg Abd Portable 1v  10/03/2014   CLINICAL DATA:  OG tube placement  EXAM: PORTABLE ABDOMEN - 1 VIEW  COMPARISON:  CT abdomen pelvis dated 09/23/2014  FINDINGS: Enteric tube terminates in the gastric cardia.  Nonobstructive bowel gas pattern.  IMPRESSION: Enteric tube terminates in the gastric cardia.   Electronically Signed   By: Julian Hy M.D.   On: 10/03/2014 17:22   Echo 09/29/14 - Procedure narrative: Transthoracic echocardiography. Image quality was poor. No apical views. - Left ventricle: The cavity size was normal. There was moderate concentric hypertrophy. Systolic function was  normal. The estimated ejection fraction was in the range of 60% to 65%. Wall motion was normal; there  were no regional wall motion abnormalities. The study is not technically sufficient to allow evaluation of LV diastolic function. Impressions: - There was no evidence of a vegetation.  CT abd 5/28  IMPRESSION: 1. Bilateral pleural effusions, ascites and anasarca is identified consistent with fluid overload state. 2. Subcutaneous fat stranding and skin thickening may be related to anasarca or cellulitis. 3. No fluid collections identified. 4. Left lung opacities may reflect pneumonia and/r or areas of asymmetric edema.  Assessment/Plan: [name] is a 64 y.o. 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  Now also with multiorgan failure, elevated LFTS, possible aspiration pna.  Decreasing hemoglobin TTE negative.  CT abd pelvis did not show any significant biliary pathology but possible sludge. Fevers resolved and wbc down   Recommendations Cont  ceftriaxone to cover the streptococcal bacteremia Cont clinda for toxin production and possible aspiration FU BCX to document clearance - neg from 5/31 Send sputum cx   [name]   10/04/2014, 1:12 PM

## 2014-10-04 NOTE — Progress Notes (Signed)
Fentanyl gtt increased to 244mcg/hr

## 2014-10-04 NOTE — Progress Notes (Signed)
Nutrition Follow-up  DOCUMENTATION CODES:     INTERVENTION:   (Parenteral Nutrition) Planning to switch TPN to 5%/15% dextrose secondary to new kcal needs for vented pt and elevated blood glucose.  Will keep at 62ml/hr at this time and reassess titration tomorrow and possibility of starting enteral nutrition in 24 hr pending pt course.  Clinimix TPN 5%AA/15%Dextrose at 40 mL/hr to provide 681 kcals and 48g protein and 939mL of fluid in 24 hours.   NUTRITION DIAGNOSIS:  Inadequate oral intake related to inability to eat as evidenced by NPO status, ongoing.    GOAL:   (Goal would be for pt to tolerate TPN at goal rate within next 24 hr), ongoing    MONITOR:   (PN, Electrolyte and Renal Profile, Digestive System, Glucose Profile, Anthropometrics, UOP )  REASON FOR ASSESSMENT:  Consult New TPN/TNA  ASSESSMENT:  Pt with acute respiratory failure, intubated, pneumonia, pulmonary edema, receiving CRRT. Planning to increase UF to 135ml per nephrology note. Pallative care following  OG tube placed with 734ml black drainage out Medications: vasopressin, levophed, sliding scale insulin  Electrolyte and Renal Profile:    Recent Labs Lab 10/02/14 0151 10/02/14 0633  10/02/14 1540 10/03/14 0558 10/03/14 1736 10/03/14 2308 10/04/14 0818  BUN  --  25*  < > 27* 23* 21* 21*  --   CREATININE  --  1.16*  < > 1.19* 0.75 0.51 0.43*  --   NA  --  133*  < > 135 137 127* 137  --   K  --  4.5  < > 5.1 3.6 2.9* 3.8  --   MG 1.7  --   --   --  1.5*  --   --  1.8  PHOS  --  2.3*  --  3.1  --   --   --  ICTERUS AT THIS LEVEL MAY AFFECT RESULT  < > = values in this interval not displayed.  UOP: 105ml noted last 24 hr  Height:  Ht Readings from Last 1 Encounters:  09/13/2014 5\' 1"  (1.549 m)    Weight:  Wt Readings from Last 1 Encounters:  10/04/14 210 lb 1.6 oz (95.3 kg)     BMI:  Body mass index is 39.72 kg/(m^2).  Estimated Nutritional Needs:  Kcal:  (11-14 kcals/d) Using  actual wt of 95kg) 1050-1330 kcals/d  Protein:  (2.0-2.5 g/d) Using IbW of 48kg 96-120 g/d  Fluid:  (25-27ml/kg) Using IBW of 48kg 1200-1411ml/d  Skin:  Reviewed, no issues  Diet Order:  .TPN (CLINIMIX-E) Adult Diet NPO time specified  EDUCATION NEEDS:  No education needs identified at this time    Last BM:  5/30   HIGH Care Level Gina Sebek B. [name], [place], [place] (pager)

## 2014-10-04 NOTE — Consult Note (Signed)
Palliative Medicine Inpatient Consult Follow Up Note   Name: [name] Date: 10/04/2014 MRN: [id number]  DOB: 09-12-1950  Referring Physician: Theodoro Grist, MD  Palliative Care consult requested for this 64 y.o. female for goals of medical therapy in patient with amyloidosis (markers - for multiple myeloma), CKD stage III, T2DM, HTN, h/o uterine fibroids, and s/p total abdominal hysterectomy.  Pt currently resting in bed and non responsive to stimuli.  Pt currently on ventilator.  Pt receiving TPN.    REVIEW OF SYSTEMS:  Patient is not able to provide ROS  CODE STATUS: Full code   PAST MEDICAL HISTORY: Past Medical History  Diagnosis Date  . HTN (hypertension)   . Diabetes mellitus type II   . Obesity   . Arthritis   . Chronic kidney disease   . Amyloidosis     PAST SURGICAL HISTORY:  Past Surgical History  Procedure Laterality Date  . Total abdominal hysterectomy  10/1999    fibroids  . Combined abdominoplasty and liposuction  07/2000  . Total knee arthroplasty Right 09/28/2013    Procedure: RIGHT TOTAL KNEE ARTHROPLASTY;  Surgeon: Mauri Pole, MD;  Location: [facility];  Service: Orthopedics;  Laterality: Right;    Vital Signs: BP 86/58 mmHg  Pulse 91  Temp(Src) 97.5 F (36.4 C) (Rectal)  Resp 18  Ht $R'5\' 1"'th$  (1.549 m)  Wt 95.3 kg (210 lb 1.6 oz)  BMI 39.72 kg/m2  SpO2 98% Filed Weights   10/02/14 0421 10/03/14 0604 10/04/14 0458  Weight: 101.1 kg (222 lb 14.2 oz) 100.7 kg (222 lb 0.1 oz) 95.3 kg (210 lb 1.6 oz)    Estimated body mass index is 39.72 kg/(m^2) as calculated from the following:   Height as of this encounter: $RemoveBeforeD'5\' 1"'crYvlPwJiUzSVn$  (1.549 m).   Weight as of this encounter: 95.3 kg (210 lb 1.6 oz).  PHYSICAL EXAM: [place]: Critically ill appearing HEENT: OP clear, ETT present Neck: Trachea midline  Cardiovascular: regular rate and rhythm Pulmonary/Chest: good air movemnt ant fields, no audible wheeze Abdominal: Soft. Hypoactive bowel sounds GU: Foley  present, scant dark urine Extremities: 3 + pitting edema BLE's and BUE's Neurological: Opens eyes to voice, moves extremities Skin: Warm, dry and intact. R flank wound with gauze, CDI Psychiatric: Unable to assess  LABS: CBC:    Component Value Date/Time   WBC 17.2* 10/03/2014 0558   WBC 9.2 08/22/2014 1712   HGB 7.9* 10/03/2014 1736   HGB 12.3 08/22/2014 1712   HCT 23.9* 10/03/2014 0558   HCT 38.3 08/22/2014 1712   PLT 47* 10/03/2014 0558   PLT 503* 08/22/2014 1712   MCV 86.7 10/03/2014 0558   MCV 87 08/22/2014 1712   NEUTROABS 21.1* 10/02/2014 1030   NEUTROABS 4.0 08/22/2014 1712   LYMPHSABS 1.5 10/02/2014 1030   LYMPHSABS 4.2* 08/22/2014 1712   MONOABS 2.0* 10/02/2014 1030   MONOABS 0.9 08/22/2014 1712   EOSABS 0.0 10/02/2014 1030   EOSABS 0.0 08/22/2014 1712   BASOSABS 0.0 10/02/2014 1030   BASOSABS 0.1 08/22/2014 1712   Comprehensive Metabolic Panel:    Component Value Date/Time   NA 137 10/03/2014 2308   NA 136 08/22/2014 1712   K 3.8 10/03/2014 2308   K 2.6* 08/22/2014 1712   CL 101 10/03/2014 2308   CL 92* 08/22/2014 1712   CO2 26 10/03/2014 2308   CO2 35* 08/22/2014 1712   BUN 21* 10/03/2014 2308   BUN 29* 08/22/2014 1712   CREATININE 0.43* 10/03/2014 2308   CREATININE 1.78*  08/22/2014 1712   GLUCOSE 210* 10/03/2014 2308   GLUCOSE 167* 08/22/2014 1712   CALCIUM 8.6* 10/03/2014 2308   CALCIUM 8.2* 08/22/2014 1712   AST 78* 10/03/2014 0558   AST 40 08/22/2014 1712   ALT 27 10/03/2014 0558   ALT 22 08/22/2014 1712   ALKPHOS 233* 10/03/2014 0558   ALKPHOS 294* 08/22/2014 1712   BILITOT 12.1* 10/03/2014 0558   PROT 4.9* 10/03/2014 0558   PROT 5.0* 08/22/2014 1712   ALBUMIN 3.4* 10/03/2014 2308   ALBUMIN 1.7* 08/22/2014 1712    IMPRESSION:  Ms. [name] is a 64 y.o. female with amyloidosis (markers - for multiple myeloma), CKD stage III, T2DM, HTN, h/o uterine fibroids, and s/p total abdominal hysterectomy. Pt presented to ER from home with AMS. ER  workup significant for lactic acidosis, severe hypoalbuminemia, and ARF. Pt admitted to CCU for treatment and evaluation of sepsis. Family at bedside.  Pt currently resting in bed on full vent support.  Pt currently on TPN and now albumin infusion.  Pt with continued CRRT.  Daughter-in-law at bedside with no complaints or concerns and family is waiting to see how pt responds.  Family understands pt is critically ill.  PLAN: 1. Full code 2. Cont current care    More than 50% of the visit was spent in counseling/coordination of care: YES  Time Spent: 25 minutes

## 2014-10-04 NOTE — Consult Note (Signed)
PULMONARY / CRITICAL CARE MEDICINE   Name: [name] MRN: [id number] DOB: July 25, 1950    ADMISSION DATE:  10/02/2014    CHIEF COMPLAINT:   resp failure     SIGNIFICANT EVENTS    Patient remains intubated,sedated. On full vent support, on multiple vasopressors,  on CRRT    PAST MEDICAL HISTORY    :  Past Medical History  Diagnosis Date  . HTN (hypertension)   . Diabetes mellitus type II   . Obesity   . Arthritis   . Chronic kidney disease   . Amyloidosis    Past Surgical History  Procedure Laterality Date  . Total abdominal hysterectomy  10/1999    fibroids  . Combined abdominoplasty and liposuction  07/2000  . Total knee arthroplasty Right 09/28/2013    Procedure: RIGHT TOTAL KNEE ARTHROPLASTY;  Surgeon: Mauri Pole, MD;  Location: [facility];  Service: Orthopedics;  Laterality: Right;   Prior to Admission medications   Medication Sig Start Date End Date Taking? Authorizing Provider  metolazone (ZAROXOLYN) 5 MG tablet Take 5 mg by mouth daily.   Yes Historical Provider, MD  midodrine (PROAMATINE) 10 MG tablet Take 5 mg by mouth 3 (three) times daily.    Yes Historical Provider, MD  polyethylene glycol powder (GLYCOLAX/MIRALAX) powder Take 17 g by mouth daily as needed for moderate constipation. 09/20/14  Yes Abner Greenspan, MD  potassium chloride SA (K-DUR,KLOR-CON) 20 MEQ tablet Take 1 tablet (20 mEq total) by mouth daily. 07/25/14  Yes Abner Greenspan, MD  torsemide (DEMADEX) 20 MG tablet Take 20 mg by mouth 2 (two) times daily as needed (swelling).    Yes Historical Provider, MD   No Known Allergies   FAMILY HISTORY   Family History  Problem Relation Age of Onset  . Arthritis Mother   . Hypertension Mother   . Cancer Mother     breast, lung  . Diabetes Mother   . Hypertension Father       SOCIAL HISTORY    reports that she has never smoked. She has never used smokeless tobacco. She reports that she does not drink alcohol or use illicit  drugs.  Review of Systems  Unable to perform ROS: critical illness      VITAL SIGNS    Temp:  [92.3 F (33.5 C)-97.5 F (36.4 C)] 97.5 F (36.4 C) (05/31 1000) Pulse Rate:  [74-92] 91 (05/31 1000) Resp:  [14-22] 18 (05/31 1000) BP: (82-129)/(48-96) 86/58 mmHg (05/31 1000) SpO2:  [86 %-100 %] 98 % (05/31 1000) FiO2 (%):  [45 %-100 %] 45 % (05/31 0801) Weight:  [210 lb 1.6 oz (95.3 kg)] 210 lb 1.6 oz (95.3 kg) (05/31 0458) HEMODYNAMICS:   VENTILATOR SETTINGS: Vent Mode:  [-] PRVC FiO2 (%):  [45 %-100 %] 45 % Set Rate:  [20 bmp] 20 bmp Vt Set:  [450 mL] 450 mL PEEP:  [5 cmH20-8 cmH20] 5 cmH20 INTAKE / OUTPUT:  Intake/Output Summary (Last 24 hours) at 10/04/14 1055 Last data filed at 10/04/14 1000  Gross per 24 hour  Intake 3388.8 ml  Output   2441 ml  Net  947.8 ml       PHYSICAL EXAM   Physical Exam  Constitutional: No distress.  HENT:  Head: Normocephalic and atraumatic.  Eyes: Pupils are equal, round, and reactive to light. No scleral icterus.  Neck: Normal range of motion. Neck supple.  Cardiovascular: Normal rate and regular rhythm.   No murmur heard. Pulmonary/Chest: No respiratory distress.  She has no wheezes. She has rales.  resp distress  Abdominal: Soft. She exhibits no distension. There is no tenderness.  Musculoskeletal: She exhibits no edema.  Neurological: She displays normal reflexes. Coordination normal.  gcs<8T  Skin: Skin is warm. No rash noted. She is not diaphoretic.       LABS   LABS:  CBC  Recent Labs Lab 10/02/14 0633 10/02/14 1030 10/03/14 0558 10/03/14 1736  WBC 26.0* 24.6* 17.2*  --   HGB 6.1* 5.9* 7.9* 7.9*  HCT 19.1* 18.6* 23.9*  --   PLT 74* 68* 47*  --    Coag's  Recent Labs Lab 10/01/14 0611 10/01/14 2303 10/02/14 0151 10/03/14 0558  APTT 61*  --  57* 50*  INR  --  5.86* 5.87* 2.55   BMET  Recent Labs Lab 10/03/14 0558 10/03/14 1736 10/03/14 2308  NA 137 127* 137  K 3.6 2.9* 3.8  CL 104  97* 101  CO2 $Re'26 25 26  'euR$ BUN 23* 21* 21*  CREATININE 0.75 0.51 0.43*  GLUCOSE 163* 163* 210*   Electrolytes  Recent Labs Lab 10/02/14 0151 10/02/14 0633  10/02/14 1540 10/03/14 0558 10/03/14 1736 10/03/14 2308 10/04/14 0818  CALCIUM  --  8.2*  < > 8.3* 8.2* 7.6* 8.6*  --   MG 1.7  --   --   --  1.5*  --   --  1.8  PHOS  --  2.3*  --  3.1  --   --   --  ICTERUS AT THIS LEVEL MAY AFFECT RESULT  < > = values in this interval not displayed. Sepsis Markers  Recent Labs Lab 09/21/2014 0045  LATICACIDVEN 5.7*   ABG  Recent Labs Lab 10/03/14 0910 10/03/14 1650 10/04/14 0510  PHART 7.38 7.35 7.41  PCO2ART 45 48 44  PO2ART 49* 289* 59*   Liver Enzymes  Recent Labs Lab 10/01/14 0611  10/02/14 1030  10/03/14 0558 10/03/14 1736 10/03/14 2308  AST 84*  --  94*  --  78*  --   --   ALT 25  --  29  --  27  --   --   ALKPHOS 520*  --  319*  --  233*  --   --   BILITOT 7.0*  --  8.9*  --  12.1*  --   --   ALBUMIN 1.5*  < > 2.8*  < > 3.1* 3.1* 3.4*  < > = values in this interval not displayed. Cardiac Enzymes No results for input(s): TROPONINI, PROBNP in the last 168 hours. Glucose  Recent Labs Lab 10/03/14 1145 10/03/14 1347 10/03/14 1830 10/03/14 2359 10/04/14 0550 10/04/14 1044  GLUCAP 170* 164* 159* 214* 227* 238*     Recent Results (from the past 240 hour(s))  Blood culture (routine x 2)     Status: None   Collection Time: 09/11/2014 12:45 AM  Result Value Ref Range Status   Specimen Description BLOOD  Final   Special Requests NONE  Final   Culture  Setup Time   Final    GRAM POSITIVE COCCI IN CHAINS IN BOTH AEROBIC AND ANAEROBIC BOTTLES CRITICAL RESULT CALLED TO, READ BACK BY AND VERIFIED WITH: PAM CRAWFORD AT 1406 09/13/2014 DV    Culture STREPTOCOCCUS SPECIES  Final   Report Status 10/01/2014 FINAL  Final   Organism ID, Bacteria STREPTOCOCCUS SPECIES  Final      Susceptibility   Streptococcus species - MIC (ETEST)*    ERYTHROMYCIN Value in next row  Resistant      RESISTANT6    PENICILLIN Value in next row Intermediate      INTERMEDIATE0.25    CEFTRIAXONE Value in next row Sensitive      SENSITIVE0.047    VANCOMYCIN Value in next row Sensitive      SENSITIVE1.0    LEVOFLOXACIN Value in next row Sensitive      SENSITIVE1.0    * STREPTOCOCCUS SPECIES  Blood culture (routine x 2)     Status: None   Collection Time: 09/30/2014 12:45 AM  Result Value Ref Range Status   Specimen Description BLOOD  Final   Special Requests NONE  Final   Culture  Setup Time   Final    GRAM POSITIVE COCCI IN CHAINS IN BOTH AEROBIC AND ANAEROBIC BOTTLES CRITICAL RESULT CALLED TO, READ BACK BY AND VERIFIED WITH: PAM CRAWFORD AT 1406 10/02/2014 DV    Culture   Final    STREPTOCOCCUS SPECIES REFER TO ACCESSION F7510 COLLECTED ON 09/10/2014 AT 0045 FOR SENSITIVITIES    Report Status 09/30/2014 FINAL  Final     Current facility-administered medications:  .  Marland KitchenTPN (CLINIMIX-E) Adult, , Intravenous, Continuous TPN, Hillary Bow, MD, Last Rate: 40 mL/hr at 10/03/14 1947 .  acetaminophen (TYLENOL) tablet 650 mg, 650 mg, Oral, Q6H PRN **OR** acetaminophen (TYLENOL) suppository 650 mg, 650 mg, Rectal, Q6H PRN, Lytle Butte, MD, 650 mg at 10/03/14 1030 .  acetaminophen (TYLENOL) tablet 650 mg, 650 mg, Oral, Daily, Lloyd Huger, MD, 650 mg at 10/03/14 1021 .  albumin human 25 % solution 25 g, 25 g, Intravenous, Q6H, Munsoor Lateef, MD, 25 g at 10/04/14 0317 .  albuterol (PROVENTIL) (2.5 MG/3ML) 0.083% nebulizer solution 2.5 mg, 2.5 mg, Nebulization, Q6H PRN, Juluis Mire, MD, 2.5 mg at 10/03/14 0816 .  antiseptic oral rinse (CPC / CETYLPYRIDINIUM CHLORIDE 0.05%) solution 7 mL, 7 mL, Mouth Rinse, QID, Erby Pian, MD, 7 mL at 10/04/14 0322 .  cefTRIAXone (ROCEPHIN) 2 g in dextrose 5 % 50 mL IVPB - Premix, 2 g, Intravenous, Q24H, Srikar Sudini, MD, 2 g at 10/03/14 1757 .  chlorhexidine (PERIDEX) 0.12 % solution 15 mL, 15 mL, Mouth Rinse, BID, Erby Pian, MD, 15 mL at 10/04/14 0816 .  clindamycin (CLEOCIN) IVPB 600 mg, 600 mg, Intravenous, 3 times per day, Adrian Prows, MD, 600 mg at 10/04/14 0535 .  fentaNYL 2553mcg in NS 267mL (64mcg/ml) infusion-PREMIX, 10 mcg/hr, Intravenous, Continuous, Srikar Sudini, MD, Last Rate: 10 mL/hr at 10/04/14 0600, 100 mcg/hr at 10/04/14 0600 .  heparin injection 1,000-6,000 Units, 1,000-6,000 Units, CRRT, PRN, Murlean Iba, MD, 1,000 Units at 10/01/14 1435 .  hydrocortisone sodium succinate (SOLU-CORTEF) 100 MG injection 50 mg, 50 mg, Intravenous, Q12H, Hillary Bow, MD, 50 mg at 10/04/14 0508 .  Immune Globulin 10% (OCTAGAM) IV infusion 50 g, 500 mg/kg, Intravenous, Q24H, Lloyd Huger, MD, Last Rate: 120 mL/hr at 10/03/14 1750, 50 g at 10/03/14 1750 .  insulin aspart (novoLOG) injection 0-9 Units, 0-9 Units, Subcutaneous, Q4H, Flora Lipps, MD .  lactulose (CHRONULAC) 10 GM/15ML solution 20 g, 20 g, Oral, BID, Josefine Class, MD, 20 g at 10/01/14 2116 .  midodrine (PROAMATINE) tablet 10 mg, 10 mg, Oral, TID, Lytle Butte, MD, 10 mg at 09/29/14 1604 .  morphine 2 MG/ML injection 2 mg, 2 mg, Intravenous, Q4H PRN, Lytle Butte, MD, 2 mg at 10/03/14 2351 .  norepinephrine (LEVOPHED) 16 mg in dextrose 5 % 250 mL (0.064 mg/mL)  infusion, 0-40 mcg/min, Intravenous, Titrated, Srikar Sudini, MD, Last Rate: 18.8 mL/hr at 10/04/14 0600, 20 mcg/min at 10/04/14 0600 .  ondansetron (ZOFRAN) tablet 4 mg, 4 mg, Oral, Q6H PRN **OR** ondansetron (ZOFRAN) injection 4 mg, 4 mg, Intravenous, Q6H PRN, Lytle Butte, MD, 4 mg at 10/02/14 1804 .  pantoprazole (PROTONIX) injection 80 mg, 80 mg, Intravenous, Q12H, Vira Blanco, RPH, 80 mg at 10/03/14 2204 .  polyethylene glycol powder (GLYCOLAX/MIRALAX) container 17 g, 17 g, Oral, Daily PRN, Lytle Butte, MD .  pureflow IV solution for Dialysis, , CRRT, Continuous, Munsoor Lateef, MD, Last Rate: 2,000 mL/hr at 10/04/14 0640 .  sodium chloride 0.9 % injection 10-40  mL, 10-40 mL, Intracatheter, Q12H, Srikar Sudini, MD, 10 mL at 10/03/14 1649 .  sodium chloride 0.9 % injection 10-40 mL, 10-40 mL, Intracatheter, PRN, Hillary Bow, MD .  vasopressin (PITRESSIN) 40 Units in sodium chloride 0.9 % 250 mL (0.16 Units/mL) infusion, 0.03 Units/min, Intravenous, Continuous, Lytle Butte, MD, Last Rate: 11.3 mL/hr at 10/04/14 0600, 0.03 Units/min at 10/04/14 0600  IMAGING    Dg Chest Port 1 View  10/03/2014   CLINICAL DATA:  Orogastric and endotracheal tube placement.  EXAM: PORTABLE CHEST - 1 VIEW  COMPARISON:  10/03/2014 at 12:51 p.m.  FINDINGS: Orogastric tube passes below the diaphragm to curl within a mostly decompressed stomach.  Endotracheal tube tip projects the right mainstem bronchus.  Patchy airspace consolidation is noted in the peripheral right upper mid and lower lung and inferior to the right hilum.  Hazy left sided opacity noted previously has mostly resolved. There is mild residual opacity at the left lung base likely atelectasis. There is no pulmonary edema.  No pneumothorax.  IMPRESSION: 1. Endotracheal tube is now positioned. Tip is in the right mainstem bronchus. It will need to be retracted 3 cm for optimal positioning. Critical Value/emergent results were called by telephone at the time of interpretation on 10/03/2014 at 5:25 pm to the nurse in charge of this patient, who verbally acknowledged these results and will relay these results to respiratory. 2. Persistent right lung consolidation. Improved left lung aeration. 3. Orogastric tube well positioned curling within a mostly decompressed stomach.   Electronically Signed   By: Lajean Manes M.D.   On: 10/03/2014 17:26   Dg Chest Port 1 View  10/03/2014   CLINICAL DATA:  Status post central line adjustment  EXAM: PORTABLE CHEST - 1 VIEW  COMPARISON:  10/03/2014  FINDINGS: Cardiac shadow is stable. Increased density is noted over the left hemi thorax consistent with a posterior fusion. The previously seen  central line is been repositioned and now lies within the proximal superior vena cava. No pneumothorax is noted. Patchy changes are seen within the lungs.  IMPRESSION: Patchy infiltrative changes are again identified bilaterally. Diffuse increased density is noted on the left which may be related to an evolving effusion. The central line has been repositioned without evidence of pneumothorax.   Electronically Signed   By: Inez Catalina M.D.   On: 10/03/2014 13:19   Dg Chest Port 1 View  10/03/2014   CLINICAL DATA:  Central line adjustment.  EXAM: PORTABLE CHEST - 1 VIEW  COMPARISON:  10/03/2014 at 10:18 a.m.  FINDINGS: Right sided central line curls overlying the right neck. This does not enter the thorax. It is not changed from the earlier study.  No change in the appearance of the lungs.  No pneumothorax.  IMPRESSION: Right central line is seen curling  over the neck, not entering thorax and not changed from the prior study.   Electronically Signed   By: Lajean Manes M.D.   On: 10/03/2014 12:13   Dg Abd Portable 1v  10/03/2014   CLINICAL DATA:  OG tube placement  EXAM: PORTABLE ABDOMEN - 1 VIEW  COMPARISON:  CT abdomen pelvis dated 09/23/2014  FINDINGS: Enteric tube terminates in the gastric cardia.  Nonobstructive bowel gas pattern.  IMPRESSION: Enteric tube terminates in the gastric cardia.   Electronically Signed   By: Julian Hy M.D.   On: 10/03/2014 17:22      Indwelling Urinary Catheter continued, requirement due to   Reason to continue Indwelling Urinary Catheter for strict Intake/Output monitoring for hemodynamic instability   Central Line continued, requirement due to   Reason to continue [employer] Monitoring of central venous pressure or other hemodynamic parameters   Ventilator continued, requirement due to, resp failure    Ventilator Sedation RASS 0 to -2      ASSESSMENT/PLAN   64 yo AAF admitted to ICU for acute septic shock and progressive resp failure with renal  failure Patient with underlying Amyloidosis with progressive multiorgan failure   PULMONARY-Respiratory Failure -continue Full MV support -continue Bronchodilator Therapy -Wean Fio2 and PEEP as tolerated -will perform SAT/SBt when respiratory parameters are met   CARDIOVASCULAR -septic shock-use vasopressors to keep MAP>65 -ABX as prescribed   RENAL -progressive renal failure from ATN -on CRRT -follow up Nephrology recs  GASTROINTESTINAL -increased OG output -bowel rest for now   HEMATOLOGIC -follow H/h  INFECTIOUS -streptococcus bacteremia from cellulitis -on abx -repeat blood cultures   ENDOCRINE -follow FSBS  NEUROLOGIC -RASS goal 0 to -2    I have personally obtained a history, examined the patient, evaluated laboratory and imaging results, formulated the assessment and plan and placed orders.  The Patient requires high complexity decision making for assessment and support, frequent evaluation and titration of therapies, application of advanced monitoring technologies and extensive interpretation of multiple databases. Critical Care Time devoted to patient care services described in this note is 45 minutes.   Overall, patient is critically ill, prognosis is very poor/guarded. Patient at high risk for cardiac arrest and death.   Palliative care team consulted-recommend DNR status  Corrin Parker, M.D. [facility] & [facility] Director Intensive Care Unit   10/04/2014, 10:55 AM

## 2014-10-04 NOTE — Progress Notes (Signed)
Inpatient Diabetes Program Recommendations  AACE/ADA: New Consensus Statement on Inpatient Glycemic Control (2013)  Target Ranges:  Prepandial:   less than 140 mg/dL      Peak postprandial:   less than 180 mg/dL (1-2 hours)      Critically ill patients:  140 - 180 mg/dL   Review of Glycemic Control:  Results for SHAVANNA, FURNARI (MRN [id number]) as of 10/04/2014 10:54  Ref. Range 10/03/2014 13:47 10/03/2014 18:30 10/03/2014 23:59 10/04/2014 05:50 10/04/2014 10:44  Glucose-Capillary Latest Ref Range: 65-99 mg/dL 164 (H) 159 (H) 214 (H) 227 (H) 238 (H)    Note CBG's increased with initiation of TPN.  May consider increasing Novolog correction to moderate q 4 hours.  If CBG's remain elevated, may need low dose basal insulin while on TPN.   Will follow.  Thanks, Adah Perl, RN, BC-ADM Inpatient Diabetes Coordinator Pager [phone] (8a-5p)

## 2014-10-04 NOTE — Progress Notes (Signed)
Removed fentanyl drip for patient in CCU room 5 last name Rolena Infante due to emergency situation and MD verbal order by Dr. Mortimer Fries. There was no active order for room 5 at that time due to emergent procedure at bedside and they had used all PRN pushes which then required a fentanyl drip. This drip is not available with an override option so had to pull it out under this patient. Notified Michael with Pharmacy, Dr. Mortimer Fries, Charge Nurse Jeannene Patella, and Everett Graff RN Assistant Director to CCU. This drip was then discontinued and 2100 mcg wasted in pyxis under this patient due to not having an option to waste under the patient which the drip was used. Please do not hesitate to contact me if you have any questions.   Valora Corporal

## 2014-10-04 NOTE — Progress Notes (Signed)
Subjective:  Renal function remains quite poor. Now intubated.  Remains on pressors as well.   Objective:  Vital signs in last 24 hours:  Temp:  [92.3 F (33.5 C)-97.3 F (36.3 C)] 97 F (36.1 C) (05/31 0800) Pulse Rate:  [74-92] 90 (05/31 0800) Resp:  [14-22] 18 (05/31 0800) BP: (82-129)/(48-96) 94/65 mmHg (05/31 0800) SpO2:  [86 %-100 %] 100 % (05/31 0800) FiO2 (%):  [45 %-100 %] 45 % (05/31 0801) Weight:  [95.3 kg (210 lb 1.6 oz)] 95.3 kg (210 lb 1.6 oz) (05/31 0458)  Weight change: -5.4 kg (-11 lb 14.5 oz) Filed Weights   10/02/14 0421 10/03/14 0604 10/04/14 0458  Weight: 101.1 kg (222 lb 14.2 oz) 100.7 kg (222 lb 0.1 oz) 95.3 kg (210 lb 1.6 oz)    Intake/Output: I/O last 3 completed shifts: In: 5364.4 [I.V.:2676.2; Blood:1285; IV Piggyback:1000] Out: 5361 [Urine:10; Emesis/NG output:775; Other:2741]     Physical Exam: General: Critically ill appearing  HEENT Eyes closed, ETT in place  Neck supple  Pulm/lungs Decreased breath sounds at bases, vent assisted  CVS/Heart S1S2 no rubs  Abdomen:  Soft, non tender, BS present  Extremities: +++ anasarca  Neurologic: On the vent  Skin: + edema, rt groin cellulitis   Access: Temp cath- rt femoral       Basic Metabolic Panel:  Recent Labs Lab 09/30/14 0436  09/30/14 1345  10/01/14 0611 10/01/14 1211 10/01/14 1606 10/01/14 2303 10/02/14 0151 10/02/14 0633 10/02/14 1030 10/02/14 1540 10/03/14 0558 10/03/14 1736 10/03/14 2308  NA  --   < > 133*  < > 132* 133* 137 135  --  133* 135 135 137 127* 137  K  --   < > 4.3  < > 4.2 4.6 4.6 4.5  --  4.5 4.7 5.1 3.6 2.9* 3.8  CL  --   < > 101  < > 99* 100* 99* 101  --  102 100* 101 104 97* 101  CO2  --   < > 25  < > 25 25 26 25   --  25 26 26 26 25 26   GLUCOSE  --   < > 111*  < > 122* 142* 156* 163*  --  184* 176* 181* 163* 163* 210*  BUN  --   < > 16  < > 18 20 21* 22*  --  25* 27* 27* 23* 21* 21*  CREATININE  --   < > 1.01*  < > 1.15* 1.14* 1.16* 1.21*  --  1.16*  1.14* 1.19* 0.75 0.51 0.43*  CALCIUM  --   < > 7.9*  < > 8.0* 8.0* 8.3* 8.2*  --  8.2* 8.3* 8.3* 8.2* 7.6* 8.6*  MG 1.6*  --  1.8  --  1.8  --   --   --  1.7  --   --   --  1.5*  --   --   PHOS  --   < > 1.9*  < > 2.1* 2.3* 2.7 2.5  --  2.3*  --  3.1  --   --   --   < > = values in this interval not displayed.   CBC:  Recent Labs Lab 09/30/14 0836 10/01/14 0611 10/02/14 0633 10/02/14 1030 10/03/14 0558 10/03/14 1736  WBC 32.7* 35.4* 26.0* 24.6* 17.2*  --   NEUTROABS  --   --   --  21.1*  --   --   HGB 10.0* 8.8* 6.1* 5.9* 7.9* 7.9*  HCT 31.3*  27.4* 19.1* 18.6* 23.9*  --   MCV 88.9 88.6 88.7 88.6 86.7  --   PLT 149* 105* 74* 68* 47*  --       Microbiology: Results for orders placed or performed during the hospital encounter of 10/04/2014  Blood culture (routine x 2)     Status: None   Collection Time: 09/30/2014 12:45 AM  Result Value Ref Range Status   Specimen Description BLOOD  Final   Special Requests NONE  Final   Culture  Setup Time   Final    GRAM POSITIVE COCCI IN CHAINS IN BOTH AEROBIC AND ANAEROBIC BOTTLES CRITICAL RESULT CALLED TO, READ BACK BY AND VERIFIED WITH: PAM CRAWFORD AT 1406 09/10/2014 DV    Culture STREPTOCOCCUS SPECIES  Final   Report Status 10/01/2014 FINAL  Final   Organism ID, Bacteria STREPTOCOCCUS SPECIES  Final      Susceptibility   Streptococcus species - MIC (ETEST)*    ERYTHROMYCIN Value in next row Resistant      RESISTANT6    PENICILLIN Value in next row Intermediate      INTERMEDIATE0.25    CEFTRIAXONE Value in next row Sensitive      SENSITIVE0.047    VANCOMYCIN Value in next row Sensitive      SENSITIVE1.0    LEVOFLOXACIN Value in next row Sensitive      SENSITIVE1.0    * STREPTOCOCCUS SPECIES  Blood culture (routine x 2)     Status: None   Collection Time: 09/05/2014 12:45 AM  Result Value Ref Range Status   Specimen Description BLOOD  Final   Special Requests NONE  Final   Culture  Setup Time   Final    GRAM POSITIVE COCCI IN  CHAINS IN BOTH AEROBIC AND ANAEROBIC BOTTLES CRITICAL RESULT CALLED TO, READ BACK BY AND VERIFIED WITH: PAM CRAWFORD AT 1406 09/07/2014 DV    Culture   Final    STREPTOCOCCUS SPECIES REFER TO ACCESSION [id number] COLLECTED ON 09/14/2014 AT 0045 FOR SENSITIVITIES    Report Status 09/30/2014 FINAL  Final    Coagulation Studies:  Recent Labs  10/01/14 2303 10/02/14 0151 10/03/14 0558  LABPROT 52.2* 52.4* 27.5*  INR 5.86* 5.87* 2.55    Urinalysis: No results for input(s): COLORURINE, LABSPEC, PHURINE, GLUCOSEU, HGBUR, BILIRUBINUR, KETONESUR, PROTEINUR, UROBILINOGEN, NITRITE, LEUKOCYTESUR in the last 72 hours.  Invalid input(s): APPERANCEUR    Imaging: Dg Chest Port 1 View  10/03/2014   CLINICAL DATA:  Orogastric and endotracheal tube placement.  EXAM: PORTABLE CHEST - 1 VIEW  COMPARISON:  10/03/2014 at 12:51 p.m.  FINDINGS: Orogastric tube passes below the diaphragm to curl within a mostly decompressed stomach.  Endotracheal tube tip projects the right mainstem bronchus.  Patchy airspace consolidation is noted in the peripheral right upper mid and lower lung and inferior to the right hilum.  Hazy left sided opacity noted previously has mostly resolved. There is mild residual opacity at the left lung base likely atelectasis. There is no pulmonary edema.  No pneumothorax.  IMPRESSION: 1. Endotracheal tube is now positioned. Tip is in the right mainstem bronchus. It will need to be retracted 3 cm for optimal positioning. Critical Value/emergent results were called by telephone at the time of interpretation on 10/03/2014 at 5:25 pm to the nurse in charge of this patient, who verbally acknowledged these results and will relay these results to respiratory. 2. Persistent right lung consolidation. Improved left lung aeration. 3. Orogastric tube well positioned curling within a mostly decompressed stomach.  Electronically Signed   By: Lajean Manes M.D.   On: 10/03/2014 17:26   Dg Chest Port 1  View  10/03/2014   CLINICAL DATA:  Status post central line adjustment  EXAM: PORTABLE CHEST - 1 VIEW  COMPARISON:  10/03/2014  FINDINGS: Cardiac shadow is stable. Increased density is noted over the left hemi thorax consistent with a posterior fusion. The previously seen central line is been repositioned and now lies within the proximal superior vena cava. No pneumothorax is noted. Patchy changes are seen within the lungs.  IMPRESSION: Patchy infiltrative changes are again identified bilaterally. Diffuse increased density is noted on the left which may be related to an evolving effusion. The central line has been repositioned without evidence of pneumothorax.   Electronically Signed   By: Inez Catalina M.D.   On: 10/03/2014 13:19   Dg Chest Port 1 View  10/03/2014   CLINICAL DATA:  Central line adjustment.  EXAM: PORTABLE CHEST - 1 VIEW  COMPARISON:  10/03/2014 at 10:18 a.m.  FINDINGS: Right sided central line curls overlying the right neck. This does not enter the thorax. It is not changed from the earlier study.  No change in the appearance of the lungs.  No pneumothorax.  IMPRESSION: Right central line is seen curling over the neck, not entering thorax and not changed from the prior study.   Electronically Signed   By: Lajean Manes M.D.   On: 10/03/2014 12:13   Dg Chest Port 1 View  10/03/2014   CLINICAL DATA:  Central line placement  EXAM: PORTABLE CHEST - 1 VIEW  COMPARISON:  None.  FINDINGS: Cardiomediastinal silhouette is stable. Patchy airspace is right upper lobe left midlung and left lower lobe again noted suspicious for multifocal pneumonia. There is only partially visualized tortuous central line in right neck region. Clinical correlation is necessary. There is no pneumothorax.  IMPRESSION: Again noted patchy airspace disease bilaterally suspicious for multifocal pneumonia. Tortuous only partially visualized central line in right neck region. Probable significant retracted right IJ line. Clinical  correlation is necessary. These results were called by telephone at the time of interpretation on 10/03/2014 at 10:53 am to ICU. Nurse Sheryn Bison , who verbally acknowledged these results.   Electronically Signed   By: Lahoma Crocker M.D.   On: 10/03/2014 10:55   Dg Chest Port 1 View  10/02/2014   CLINICAL DATA:  Shortness of breath, acute onset. Initial encounter.  EXAM: PORTABLE CHEST - 1 VIEW  COMPARISON:  Chest radiograph performed 09/29/2014  FINDINGS: The lungs are hypoexpanded. Patchy bilateral airspace opacities are noted, raising concern for multifocal pneumonia. Underlying asymmetric interstitial edema on the left side cannot be excluded. A small left pleural effusion is suspected. No pneumothorax is seen.  The cardiomediastinal silhouette is mildly enlarged. No acute osseous abnormalities are seen. A right IJ line is noted ending overlying the left brachiocephalic vein.  IMPRESSION: 1. Lungs hypoexpanded. Patchy bilateral airspace opacities raise concern for multifocal pneumonia. 2. Underlying asymmetric interstitial edema on the left side cannot be excluded. Suspect small left pleural effusion. 3. Mild cardiomegaly noted. 4. Right IJ line noted ending overlying the left brachiocephalic vein, as noted on the prior study. It appears to have been retracted mildly since the prior study.   Electronically Signed   By: Garald Balding M.D.   On: 10/02/2014 20:31   Dg Abd Portable 1v  10/03/2014   CLINICAL DATA:  OG tube placement  EXAM: PORTABLE ABDOMEN - 1 VIEW  COMPARISON:  CT abdomen pelvis dated 09/23/2014  FINDINGS: Enteric tube terminates in the gastric cardia.  Nonobstructive bowel gas pattern.  IMPRESSION: Enteric tube terminates in the gastric cardia.   Electronically Signed   By: Julian Hy M.D.   On: 10/03/2014 17:22     Medications:   . Marland KitchenTPN (CLINIMIX-E) Adult 40 mL/hr at 10/03/14 1947  . fentaNYL infusion INTRAVENOUS 100 mcg/hr (10/04/14 0600)  . norepinephrine (LEVOPHED) Adult  infusion 20 mcg/min (10/04/14 0600)  . phenylephrine (NEO-SYNEPHRINE) Adult infusion Stopped (10/02/14 1100)  . propofol (DIPRIVAN) infusion    . pureflow 2,000 mL/hr at 10/04/14 0640  . vasopressin (PITRESSIN) infusion - *FOR SHOCK* 0.03 Units/min (10/04/14 0600)   . acetaminophen  650 mg Oral Daily  . albumin human  25 g Intravenous Q6H  . antiseptic oral rinse  7 mL Mouth Rinse QID  . cefTRIAXone (ROCEPHIN)  IV  2 g Intravenous Q24H  . chlorhexidine  15 mL Mouth Rinse BID  . clindamycin (CLEOCIN) IV  600 mg Intravenous 3 times per day  . hydrocortisone sod succinate (SOLU-CORTEF) inj  50 mg Intravenous Q12H  . IMMUNE GLOBULIN 10% (HUMAN) IV - For Fluid Restriction Only  500 mg/kg Intravenous Q24H  . insulin aspart  0-9 Units Subcutaneous 4 times per day  . lactulose  20 g Oral BID  . midodrine  10 mg Oral TID  . pantoprazole (PROTONIX) IV  80 mg Intravenous Q12H  . sodium chloride  10-40 mL Intracatheter Q12H   acetaminophen **OR** acetaminophen, albuterol, heparin, morphine injection, ondansetron **OR** ondansetron (ZOFRAN) IV, polyethylene glycol powder, sodium chloride  Assessment/ Plan:   65 y.o. African [place] female with diabetes mellitus type 2, hypertension, morbid obesity, osteoarthritis, status post right knee surgery, nephrotic syndrome with edema, hypoalbuminemia, renal insufficiency and proteinuria.  1. ARF with CKD st 3 . Baseline cr 1.3. (08/2014)  2. Nephrotic Syndrome with proteinuria: Renal Amyloidosis.  3. Generalized Edema 4. Hypoalbuminemia 5. Right groin cellulitis /sepsis- strep species  6. Diabetes Mellitus type II 7. Hypotension  Plan: Urine outpt remains quite low at the moment.  CRRT progressing well.  Has been net positive the past 2 days.  Therefore will increase UF rate to 175cc/hr.  May need to increase pressors.  Advised nursing that MAP goal should be 65.  Overall prognosis remains quite guarded at this point in time.  Will continue to monitor  her progress with you.  LOS: 6 Eshaal Duby 5/31/20168:52 AM

## 2014-10-04 NOTE — Progress Notes (Signed)
Pt remains sedated, intubated and on CRRT. Her potassium had dropped earlier yesterday and she now is running a 4k bath. Vasopressin and levophed running. Central line changed yesterday due to misplaced line. Her blister continues to weep and required changing. Sinus rhythm on the monitor. Very scant urine output.

## 2014-10-04 NOTE — Care Management Note (Signed)
Case Management Note  Patient Details  Name: [name] MRN: [id number] Date of Birth: 1951/04/22  Subjective/Objective:   Vented, sedated. Albumin Q 6 hours, Levophed, Vasopressin, Fentanyl gtt and TPN infusing.   Palliative consulting.               Action/Plan:   Expected Discharge Date:                  Expected Discharge Plan:     In-House Referral:     Discharge planning Services     Post Acute Care Choice:    Choice offered to:     DME Arranged:    DME Agency:     HH Arranged:    [place] Agency:     Status of Service:     Medicare Important Message Given:    Date Medicare IM Given:    Medicare IM give by:    Date Additional Medicare IM Given:    Additional Medicare Important Message give by:     If discussed at [place] of Stay Meetings, dates discussed:    Additional Comments:  Jolly Mango, RN 10/04/2014, 3:07 PM

## 2014-10-04 NOTE — Progress Notes (Signed)
RN made Dr. Mortimer Fries and Dr. Ether Griffins aware of total Bilirubin of 20.9, no new orders from either physician.  [name] B

## 2014-10-04 NOTE — Progress Notes (Signed)
[facility] NAME: [name]    MR#:  [id number]  DATE OF BIRTH:  08-18-1950  SUBJECTIVE:  CHIEF COMPLAINT:   Chief Complaint  Patient presents with  . Altered Mental Status    Critically ill. Now on 2 pressors. Vasopressin as well as 21 g of Levothroid, about stable since yesterday Intubated last night because of confusion and hypoxia. Most recent chest x-ray revealed opacification of the right lung. Patient's ET tube is suctioning brownish dark liquid concerning for possible aspiration with gastrointestinal content. Remains swollen, now on CRRT with increased ultrafiltration since yesterday per nephrologist Dr. Holley Raring.   No urine. Getting TPN.   REVIEW OF SYSTEMS:    Review of Systems  Unable to perform ROS: intubated    Unable to give history due to encephalopathy     DRUG ALLERGIES:  No Known Allergies  VITALS:  Blood pressure 81/57, pulse 93, temperature 98.4 F (36.9 C), temperature source Rectal, resp. rate 20, height 5\' 1"  (1.549 m), weight 95.3 kg (210 lb 1.6 oz), SpO2 99 %.  PHYSICAL EXAMINATION:   Physical Exam  GENERAL:  64 y.o.-year-old patient lying in the bed drowzy. EYES: Pupils equal, round, reactive to light and accommodation. No scleral icterus. Extraocular muscles intact. HEENT: Head atraumatic, normocephalic. Oropharynx and nasopharynx clear. NECK:  Supple, no jugular venous distention. No thyroid enlargement, no tenderness. Right IJ TL. LUNGS: Normal breath sounds bilaterally, no wheezing, rales, rhonchi. No use of accessory muscles of respiration. CARDIOVASCULAR: S1, S2 normal. No murmurs, rubs, or gallops. ABDOMEN: Soft, nontender, nondistended. Bowel sounds present. No organomegaly or mass. EXTREMITIES: No cyanosis, clubbing. Anasarca. Superficial ulcer right lateral thigh. Dressing displaced and not able to see level of a skin injury. Bulla is noted in the mid to lower thigh on  the right but no inflammatory changes surrounding bulla area on the skin.  NEUROLOGIC: Drowzy.lethargic. Responds to verbal stimuli PSYCHIATRIC: The patient is drowzy. SKIN:  Erythema with warmth right abdominal wall, groin,right lateral thigh. Superficial ulcer right thigh.  LABORATORY PANEL:   CBC  Recent Labs Lab 10/03/14 0558 10/03/14 1736  WBC 17.2*  --   HGB 7.9* 7.9*  HCT 23.9*  --   PLT 47*  --    ------------------------------------------------------------------------------------------------------------------  Chemistries   Recent Labs Lab 10/03/14 0558  10/03/14 2308 10/04/14 0818  NA 137  < > 137  --   K 3.6  < > 3.8  --   CL 104  < > 101  --   CO2 26  < > 26  --   GLUCOSE 163*  < > 210*  --   BUN 23*  < > 21*  --   CREATININE 0.75  < > 0.43*  --   CALCIUM 8.2*  < > 8.6*  --   MG 1.5*  --   --  1.8  AST 78*  --   --   --   ALT 27  --   --   --   ALKPHOS 233*  --   --   --   BILITOT 12.1*  --   --   --   < > = values in this interval not displayed. ------------------------------------------------------------------------------------------------------------------  Cardiac Enzymes No results for input(s): TROPONINI in the last 168 hours. ------------------------------------------------------------------------------------------------------------------  RADIOLOGY:  Dg Chest Port 1 View  10/03/2014   CLINICAL DATA:  Orogastric and endotracheal tube placement.  EXAM: PORTABLE CHEST - 1 VIEW  COMPARISON:  10/03/2014 at 12:51 p.m.  FINDINGS: Orogastric tube passes below the diaphragm to curl within a mostly decompressed stomach.  Endotracheal tube tip projects the right mainstem bronchus.  Patchy airspace consolidation is noted in the peripheral right upper mid and lower lung and inferior to the right hilum.  Hazy left sided opacity noted previously has mostly resolved. There is mild residual opacity at the left lung base likely atelectasis. There is no pulmonary  edema.  No pneumothorax.  IMPRESSION: 1. Endotracheal tube is now positioned. Tip is in the right mainstem bronchus. It will need to be retracted 3 cm for optimal positioning. Critical Value/emergent results were called by telephone at the time of interpretation on 10/03/2014 at 5:25 pm to the nurse in charge of this patient, who verbally acknowledged these results and will relay these results to respiratory. 2. Persistent right lung consolidation. Improved left lung aeration. 3. Orogastric tube well positioned curling within a mostly decompressed stomach.   Electronically Signed   By: Lajean Manes M.D.   On: 10/03/2014 17:26   Dg Chest Port 1 View  10/03/2014   CLINICAL DATA:  Status post central line adjustment  EXAM: PORTABLE CHEST - 1 VIEW  COMPARISON:  10/03/2014  FINDINGS: Cardiac shadow is stable. Increased density is noted over the left hemi thorax consistent with a posterior fusion. The previously seen central line is been repositioned and now lies within the proximal superior vena cava. No pneumothorax is noted. Patchy changes are seen within the lungs.  IMPRESSION: Patchy infiltrative changes are again identified bilaterally. Diffuse increased density is noted on the left which may be related to an evolving effusion. The central line has been repositioned without evidence of pneumothorax.   Electronically Signed   By: Inez Catalina M.D.   On: 10/03/2014 13:19   Dg Chest Port 1 View  10/03/2014   CLINICAL DATA:  Central line adjustment.  EXAM: PORTABLE CHEST - 1 VIEW  COMPARISON:  10/03/2014 at 10:18 a.m.  FINDINGS: Right sided central line curls overlying the right neck. This does not enter the thorax. It is not changed from the earlier study.  No change in the appearance of the lungs.  No pneumothorax.  IMPRESSION: Right central line is seen curling over the neck, not entering thorax and not changed from the prior study.   Electronically Signed   By: Lajean Manes M.D.   On: 10/03/2014 12:13   Dg  Chest Port 1 View  10/03/2014   CLINICAL DATA:  Central line placement  EXAM: PORTABLE CHEST - 1 VIEW  COMPARISON:  None.  FINDINGS: Cardiomediastinal silhouette is stable. Patchy airspace is right upper lobe left midlung and left lower lobe again noted suspicious for multifocal pneumonia. There is only partially visualized tortuous central line in right neck region. Clinical correlation is necessary. There is no pneumothorax.  IMPRESSION: Again noted patchy airspace disease bilaterally suspicious for multifocal pneumonia. Tortuous only partially visualized central line in right neck region. Probable significant retracted right IJ line. Clinical correlation is necessary. These results were called by telephone at the time of interpretation on 10/03/2014 at 10:53 am to ICU. Nurse Sheryn Bison , who verbally acknowledged these results.   Electronically Signed   By: Lahoma Crocker M.D.   On: 10/03/2014 10:55   Dg Chest Port 1 View  10/02/2014   CLINICAL DATA:  Shortness of breath, acute onset. Initial encounter.  EXAM: PORTABLE CHEST - 1 VIEW  COMPARISON:  Chest radiograph performed 09/21/2014  FINDINGS: The lungs are  hypoexpanded. Patchy bilateral airspace opacities are noted, raising concern for multifocal pneumonia. Underlying asymmetric interstitial edema on the left side cannot be excluded. A small left pleural effusion is suspected. No pneumothorax is seen.  The cardiomediastinal silhouette is mildly enlarged. No acute osseous abnormalities are seen. A right IJ line is noted ending overlying the left brachiocephalic vein.  IMPRESSION: 1. Lungs hypoexpanded. Patchy bilateral airspace opacities raise concern for multifocal pneumonia. 2. Underlying asymmetric interstitial edema on the left side cannot be excluded. Suspect small left pleural effusion. 3. Mild cardiomegaly noted. 4. Right IJ line noted ending overlying the left brachiocephalic vein, as noted on the prior study. It appears to have been retracted mildly since  the prior study.   Electronically Signed   By: Garald Balding M.D.   On: 10/02/2014 20:31   Dg Abd Portable 1v  10/03/2014   CLINICAL DATA:  OG tube placement  EXAM: PORTABLE ABDOMEN - 1 VIEW  COMPARISON:  CT abdomen pelvis dated 09/23/2014  FINDINGS: Enteric tube terminates in the gastric cardia.  Nonobstructive bowel gas pattern.  IMPRESSION: Enteric tube terminates in the gastric cardia.   Electronically Signed   By: Julian Hy M.D.   On: 10/03/2014 17:22     ASSESSMENT AND PLAN:   64 year old African American female history of nephrotic syndrome, amyloidosis, essential hypertension presenting with altered mental status/confusion after apparently falling out of bed. Admitted for septic shock  * Septic shock with right flank/thigh cellulitis with streptococcus bacteremia Changed to Ceftriaxone and clindamycin. No improvement inspite of aggressive care. Echo-No vegetations. The pressure has been improving. Patient now is on 2 , not on 3 pressors. Checked IgG level and are low as expected. Discussed with Georgeanne Nim with Oncology regarding replacement. Receiving albumen every 6 hours  * Acute renal failure over CKD3 due to ATN. From septic shock. On CRRT. Appreciate nephrology help. Severe hypoalbuminemia. Getting IV albumin. Will likely progress to ESRD.  * Acute liver failure- Due to sepsis Ct abd showed no biliary dilation or obstruction  * Elevated INR Due to liver failure. DIC unlikely per oncology.  * Acute encephalopathy - Due to acute illness. CT head nothing acute.  * Amyloidosis Etiology unknonw. Discussed with Dr. Grayland Ormond. Plan was for chemo as OP but now on hold due to acute illness.  * Acute anemia over AOCD Likely from slow GI bleed or hemolysis. Hematology and GI on board. Get hemoglobin level, transfuse patient as needed On protonix. Luminal evaluation cannot be done with high INR and acute illness. Transfused 2 units before ,   * Type 2 diabetes,  uncomplicated: Hold oral agents, at insulin slide scale every 6 hours Accu-Chek  * Essential hypertension: Held all meds  * Venous thromboembolism prophylactic: Heparin subcutaneous stopped due to high INR and low platelets  * Nutrition NG would be risky with high INR for bleeding. Started TPN  * Acute respiratory failure due to suspected aspiration of gastrointestinal content, continue supportive therapy with mechanical ventilation. Pulmonary consultation is appreciated. Patient is being continued on antibody therapy at present, following sputum cultures and suctioned out bronchial tree as possible.  * Coagulopathy of unclear etiology, not DIC, HUS per oncology, lung correlation tomorrow morning as well as hemoglobin level because of concerns of GI bleed   All the records are reviewed and case discussed with Care Management/Social Workerr. Management plans discussed with the patient, family and they are in agreement.  CODE STATUS: FULL CODE  Dicussed with daughter in law over the  phone. Poor prognosis.   DVT Prophylaxis: Heparin discontinued due to INR/platelets  TOTAL CRITICAL CARE TIME TAKING CARE OF THIS PATIENT: 45 minutes.    Theodoro Grist M.D on 10/04/2014 at 12:49 PM  Between 7am to 6pm - Pager - [phone]  After 6pm go to [email or web address] - password [email or web address] [facility]  Office  [phone]  CC: Primary care physician; [name], MD     Significant bilirubinemia with mostly direct. Will get Ct abdomen for any abdominal source of infection and also consult GI.

## 2014-10-04 NOTE — Progress Notes (Signed)
[place]  [phone]) [phone] [phone]) [phone]  ID: [name] OB: 05-07-1950  MR#: [id number]  XVQ#:[id number]  Patient Care Team: Abner Greenspan, MD as PCP - General Lavonia Dana, MD as Consulting Physician (Nephrology)  CHIEF COMPLAINT:  Chief Complaint  Patient presents with  . Altered Mental Status    INTERVAL HISTORY: Patient now intubated and sedated continues on 3 pressors. Was given IVIG yesterday and today. She continues on dialysis and appears to have liver failure as well. Received systems is unobtainable. Family at bedside.  REVIEW OF SYSTEMS:   Review of Systems  Unable to perform ROS: intubated    As per HPI. Otherwise, a complete review of systems is negatve.  PAST MEDICAL HISTORY: Past Medical History  Diagnosis Date  . HTN (hypertension)   . Diabetes mellitus type II   . Obesity   . Arthritis   . Chronic kidney disease   . Amyloidosis     PAST SURGICAL HISTORY: Past Surgical History  Procedure Laterality Date  . Total abdominal hysterectomy  10/1999    fibroids  . Combined abdominoplasty and liposuction  07/2000  . Total knee arthroplasty Right 09/28/2013    Procedure: RIGHT TOTAL KNEE ARTHROPLASTY;  Surgeon: Mauri Pole, MD;  Location: [facility];  Service: Orthopedics;  Laterality: Right;    FAMILY HISTORY Family History  Problem Relation Age of Onset  . Arthritis Mother   . Hypertension Mother   . Cancer Mother     breast, lung  . Diabetes Mother   . Hypertension Father        ADVANCED DIRECTIVES:    HEALTH MAINTENANCE: History  Substance Use Topics  . Smoking status: Never Smoker   . Smokeless tobacco: Never Used  . Alcohol Use: No     Colonoscopy:  PAP:  Bone density:  Lipid panel:  No Known Allergies  Current Facility-Administered Medications  Medication Dose Route Frequency Provider Last Rate Last Dose  . Marland KitchenTPN (CLINIMIX-E) Adult   Intravenous Continuous TPN Hillary Bow, MD 40  mL/hr at 10/03/14 1947    . Marland KitchenTPN (CLINIMIX-E) Adult   Intravenous Continuous TPN Srikar Sudini, MD      . acetaminophen (TYLENOL) tablet 650 mg  650 mg Oral Q6H PRN Lytle Butte, MD       Or  . acetaminophen (TYLENOL) suppository 650 mg  650 mg Rectal Q6H PRN Lytle Butte, MD   650 mg at 10/03/14 1030  . acetaminophen (TYLENOL) tablet 650 mg  650 mg Oral Daily Lloyd Huger, MD   650 mg at 10/04/14 1154  . albumin human 25 % solution 25 g  25 g Intravenous Q6H Munsoor Lateef, MD   25 g at 10/04/14 1059  . albuterol (PROVENTIL) (2.5 MG/3ML) 0.083% nebulizer solution 2.5 mg  2.5 mg Nebulization Q6H PRN Juluis Mire, MD   2.5 mg at 10/03/14 0816  . antiseptic oral rinse (CPC / CETYLPYRIDINIUM CHLORIDE 0.05%) solution 7 mL  7 mL Mouth Rinse QID Erby Pian, MD   7 mL at 10/04/14 1307  . cefTRIAXone (ROCEPHIN) 2 g in dextrose 5 % 50 mL IVPB - Premix  2 g Intravenous Q24H Hillary Bow, MD   2 g at 10/03/14 1757  . chlorhexidine (PERIDEX) 0.12 % solution 15 mL  15 mL Mouth Rinse BID Erby Pian, MD   15 mL at 10/04/14 0816  . clindamycin (CLEOCIN) IVPB 600 mg  600 mg Intravenous 3 times per day Adrian Prows,  MD   600 mg at 10/04/14 0535  . fentaNYL 256mcg in NS 228mL (35mcg/ml) infusion-PREMIX  10 mcg/hr Intravenous Continuous Srikar Sudini, MD 15 mL/hr at 10/04/14 1328 150 mcg/hr at 10/04/14 1328  . heparin injection 1,000-6,000 Units  1,000-6,000 Units CRRT PRN Murlean Iba, MD   1,000 Units at 10/01/14 1435  . hydrocortisone sodium succinate (SOLU-CORTEF) 100 MG injection 50 mg  50 mg Intravenous Q12H Hillary Bow, MD   50 mg at 10/04/14 0508  . insulin aspart (novoLOG) injection 0-9 Units  0-9 Units Subcutaneous Q4H Flora Lipps, MD   3 Units at 10/04/14 1100  . lactulose (CHRONULAC) 10 GM/15ML solution 20 g  20 g Oral BID Josefine Class, MD   20 g at 10/04/14 1149  . midodrine (PROAMATINE) tablet 10 mg  10 mg Oral TID Lytle Butte, MD   10 mg at 10/04/14 1149  .  morphine 2 MG/ML injection 2 mg  2 mg Intravenous Q4H PRN Lytle Butte, MD   2 mg at 10/03/14 2351  . norepinephrine (LEVOPHED) 16 mg in dextrose 5 % 250 mL (0.064 mg/mL) infusion  0-40 mcg/min Intravenous Titrated Srikar Sudini, MD 18.8 mL/hr at 10/04/14 0600 20 mcg/min at 10/04/14 0600  . ondansetron (ZOFRAN) tablet 4 mg  4 mg Oral Q6H PRN Lytle Butte, MD       Or  . ondansetron [facility]) injection 4 mg  4 mg Intravenous Q6H PRN Lytle Butte, MD   4 mg at 10/02/14 1804  . pantoprazole (PROTONIX) injection 80 mg  80 mg Intravenous Q12H Vira Blanco, RPH   80 mg at 10/04/14 1149  . polyethylene glycol powder (GLYCOLAX/MIRALAX) container 17 g  17 g Oral Daily PRN Lytle Butte, MD      . pureflow IV solution for Dialysis   CRRT Continuous Munsoor Lateef, MD 2,000 mL/hr at 10/04/14 1220    . sodium chloride 0.9 % injection 10-40 mL  10-40 mL Intracatheter Q12H Srikar Sudini, MD   10 mL at 10/04/14 1150  . sodium chloride 0.9 % injection 10-40 mL  10-40 mL Intracatheter PRN Srikar Sudini, MD      . vasopressin (PITRESSIN) 40 Units in sodium chloride 0.9 % 250 mL (0.16 Units/mL) infusion  0.03 Units/min Intravenous Continuous Lytle Butte, MD 11.3 mL/hr at 10/04/14 1104 0.03 Units/min at 10/04/14 1104    OBJECTIVE: Filed Vitals:   10/04/14 1300  BP: 94/62  Pulse: 94  Temp: 98.4 F (36.9 C)  Resp: 20     Body mass index is 39.72 kg/(m^2).    ECOG FS:4 - Bedbound  General: Ill-appearing, intubated. HEENT: ET tube in place. Lungs: Course breath sounds. Heart: Regular rate and rhythm. No rubs, murmurs, or gallops. Abdomen: Soft, nontender, nondistended. No organomegaly noted, normoactive bowel sounds. Musculoskeletal: Significant lower extremity edema. Neuro: Intubated and sedated.. Skin: No rashes or petechiae noted.   LAB RESULTS:  Lab Results  Component Value Date   NA 137 10/03/2014   K 3.8 10/03/2014   CL 101 10/03/2014   CO2 26 10/03/2014   GLUCOSE 210* 10/03/2014   BUN  21* 10/03/2014   CREATININE 0.43* 10/03/2014   CALCIUM 8.6* 10/03/2014   PROT 4.9* 10/03/2014   ALBUMIN 3.4* 10/03/2014   AST 78* 10/03/2014   ALT 27 10/03/2014   ALKPHOS 233* 10/03/2014   BILITOT 12.1* 10/03/2014   GFRNONAA >60 10/03/2014   GFRAA >60 10/03/2014    Lab Results  Component Value Date  WBC 16.7* 10/04/2014   NEUTROABS 21.1* 10/02/2014   HGB 7.3* 10/04/2014   HCT 21.4* 10/04/2014   MCV 87.8 10/04/2014   PLT 44* 10/04/2014     STUDIES: Ct Abdomen Pelvis Wo Contrast  10/01/2014   CLINICAL DATA:  Right flank cellulitis and edema.  EXAM: CT ABDOMEN AND PELVIS WITHOUT CONTRAST  TECHNIQUE: Multidetector CT imaging of the abdomen and pelvis was performed following the standard protocol without IV contrast.  COMPARISON:  08/31/2014  FINDINGS: Lower chest: Small bilateral pleural effusions are noted left greater than right. There is airspace consolidation noted within both lung bases. Patchy ground-glass and airspace opacity within the medial left upper lobe is noted, image number 3/series 4.  Hepatobiliary: There is no suspicious liver abnormality identified. Increased attenuation within the gallbladder may Roux represent stones and/or sludge. No biliary dilatation.  Pancreas: Normal appearance of the pancreas.  Spleen: The spleen is on unremarkable.  Adrenals/Urinary Tract: Normal appearance of the left adrenal gland. Similar appearance of low-attenuation right adrenal gland nodule measuring 2.9 cm, image 30/series 2. The kidneys are both on unremarkable. The urinary bladder is collapsed around a Foley catheter balloon.  Stomach/Bowel: Moderate distension of the stomach containing fluid level. The small bowel loops have a normal course and caliber. Normal appearance of the colon.  Vascular/Lymphatic: There is a right femoral dialysis catheter with tip in the IVC. Abdominal aorta has a normal caliber. No adenopathy.  Reproductive: Previous hysterectomy.  No adnexal mass.  Other:  Moderate free fluid is identified within the abdomen and pelvis. There is diffuse body wall edema and skin thickening. No focal fluid collection identified to suggest abscess.  Musculoskeletal: Degenerative disc disease noted within the lumbar spine.  IMPRESSION: 1. Bilateral pleural effusions, ascites and anasarca is identified consistent with fluid overload state. 2. Subcutaneous fat stranding and skin thickening may be related to anasarca or cellulitis. 3. No fluid collections identified. 4. Left lung opacities may reflect pneumonia and/r or areas of asymmetric edema.   Electronically Signed   By: Kerby Moors M.D.   On: 10/01/2014 15:17   Dg Chest 1 View  09/17/2014   CLINICAL DATA:  Central line placement.  EXAM: CHEST  1 VIEW  COMPARISON:  06/26/2014  FINDINGS: Right IJ central line with tip crossing midline and terminating in the region of the left brachiocephalic vein.  Hypoventilation. When accounting for this there is no cardiomegaly or change in aortic tortuosity. No pneumothorax, edema, effusion, or definitive pneumonia.  These results were called by telephone at the time of interpretation on 09/04/2014 at 3:14 am to [place] , who verbally acknowledged these results.  IMPRESSION: 1. Right IJ central line with tip crossing midline and projecting at the left brachiocephalic vein. No pneumothorax. 2. Hypoventilation.   Electronically Signed   By: Monte Fantasia M.D.   On: 09/19/2014 03:15   Ct Head Wo Contrast  09/06/2014   CLINICAL DATA:  Altered mental status after fall from couch. Posterior neck pain. Initial encounter.  EXAM: CT HEAD WITHOUT CONTRAST  CT CERVICAL SPINE WITHOUT CONTRAST  TECHNIQUE: Multidetector CT imaging of the head and cervical spine was performed following the standard protocol without intravenous contrast. Multiplanar CT image reconstructions of the cervical spine were also generated.  COMPARISON:  None.  FINDINGS: CT HEAD FINDINGS  Skull and Sinuses:Negative for fracture  or destructive process. The mastoids, middle ears, and imaged paranasal sinuses are clear.  Orbits: No acute abnormality.  Brain: No evidence of acute infarction, hemorrhage, hydrocephalus,  or mass lesion/mass effect.  CT CERVICAL SPINE FINDINGS  Thyroid gland has indistinct margins with mild surrounding fat stranding. A sub cm dense nodule is noted on the right. No surrounding adenopathy.  No evidence of acute fracture or traumatic malalignment. No gross cervical canal hematoma. There is degenerative disc disease and endplate spurring, most progressed at C4-5, C5-6, and C6-7. Spurring is most notable at C5-6 with right uncovertebral spurs causing advanced foraminal stenosis. A superimposed right paracentral disc herniation with vacuum phenomenon contributes to the C5-6 right foraminal stenosis.  IMPRESSION: 1. No evidence of intracranial or cervical spine injury. 2. Mid and lower cervical degenerative disc disease with foraminal stenoses, most advanced on the right at C5-6. 3. Edema noted around the thyroid gland, suspicious for thyroiditis. Correlate with endocrine history and thyroid function tests.   Electronically Signed   By: Monte Fantasia M.D.   On: 09/17/2014 02:31   Ct Cervical Spine Wo Contrast  10/04/2014   CLINICAL DATA:  Altered mental status after fall from couch. Posterior neck pain. Initial encounter.  EXAM: CT HEAD WITHOUT CONTRAST  CT CERVICAL SPINE WITHOUT CONTRAST  TECHNIQUE: Multidetector CT imaging of the head and cervical spine was performed following the standard protocol without intravenous contrast. Multiplanar CT image reconstructions of the cervical spine were also generated.  COMPARISON:  None.  FINDINGS: CT HEAD FINDINGS  Skull and Sinuses:Negative for fracture or destructive process. The mastoids, middle ears, and imaged paranasal sinuses are clear.  Orbits: No acute abnormality.  Brain: No evidence of acute infarction, hemorrhage, hydrocephalus, or mass lesion/mass effect.  CT  CERVICAL SPINE FINDINGS  Thyroid gland has indistinct margins with mild surrounding fat stranding. A sub cm dense nodule is noted on the right. No surrounding adenopathy.  No evidence of acute fracture or traumatic malalignment. No gross cervical canal hematoma. There is degenerative disc disease and endplate spurring, most progressed at C4-5, C5-6, and C6-7. Spurring is most notable at C5-6 with right uncovertebral spurs causing advanced foraminal stenosis. A superimposed right paracentral disc herniation with vacuum phenomenon contributes to the C5-6 right foraminal stenosis.  IMPRESSION: 1. No evidence of intracranial or cervical spine injury. 2. Mid and lower cervical degenerative disc disease with foraminal stenoses, most advanced on the right at C5-6. 3. Edema noted around the thyroid gland, suspicious for thyroiditis. Correlate with endocrine history and thyroid function tests.   Electronically Signed   By: Monte Fantasia M.D.   On: 09/21/2014 02:31   Nm Cardiac Muga Rest  09/20/2014   CLINICAL DATA:  . Evaluate cardiac function in relation to chemotherapy.  EXAM: NUCLEAR MEDICINE CARDIAC BLOOD POOL IMAGING (MUGA)  TECHNIQUE: Cardiac multi-gated acquisition was performed at rest following intravenous injection of Tc-45m labeled red blood cells.  RADIOPHARMACEUTICALS:  21.9 mCi Technetium-72m in-vitro labeled red blood cells IV  COMPARISON:  None.  FINDINGS: No  focal wall motion abnormality of the left ventricle.  Calculated left ventricular ejection fraction equals 65%  IMPRESSION: Left ventricular ejection fraction equals 65%   Electronically Signed   By: Suzy Bouchard M.D.   On: 09/20/2014 09:31   Ct Biopsy  09/08/2014   CLINICAL DATA:  Amyloidosis, nephrotic syndrome, hypertension  EXAM: CT GUIDED RIGHT ILIAC BONE MARROW ASPIRATION AND CORE BIOPSY  Date:  5/5/20165/09/2014 9:32 am  Radiologist:  M. Daryll Brod, MD  Guidance:  CT  FLUOROSCOPY TIME:  NONE.  MEDICATIONS AND MEDICAL HISTORY: 1 MG  VERSED, 25 MCG FENTANYL  ANESTHESIA/SEDATION: 15 MINUTES  CONTRAST:  None.  COMPLICATIONS: None  PROCEDURE: Informed consent was obtained from the patient following explanation of the procedure, risks, benefits and alternatives. The patient understands, agrees and consents for the procedure. All questions were addressed. A time out was performed.  The patient was positioned prone and noncontrast localization CT was performed of the pelvis to demonstrate the iliac marrow spaces.  Maximal barrier sterile technique utilized including caps, mask, sterile gowns, sterile gloves, large sterile drape, hand hygiene, and betadine prep.  Under sterile conditions and local anesthesia, an 11 gauge coaxial bone biopsy needle was advanced into the right iliac marrow space. Needle position was confirmed with CT imaging. Initially, bone marrow aspiration was performed. Next, the 11 gauge outer cannula was utilized to obtain a right iliac bone marrow core biopsy. Needle was removed. Hemostasis was obtained with compression. The patient tolerated the procedure well. Samples were prepared with the cytotechnologist. No immediate complications.  IMPRESSION: CT guided right iliac bone marrow aspiration and core biopsy.   Electronically Signed   By: Jerilynn Mages.  Shick M.D.   On: 09/08/2014 10:43   Dg Chest Port 1 View  10/03/2014   CLINICAL DATA:  Orogastric and endotracheal tube placement.  EXAM: PORTABLE CHEST - 1 VIEW  COMPARISON:  10/03/2014 at 12:51 p.m.  FINDINGS: Orogastric tube passes below the diaphragm to curl within a mostly decompressed stomach.  Endotracheal tube tip projects the right mainstem bronchus.  Patchy airspace consolidation is noted in the peripheral right upper mid and lower lung and inferior to the right hilum.  Hazy left sided opacity noted previously has mostly resolved. There is mild residual opacity at the left lung base likely atelectasis. There is no pulmonary edema.  No pneumothorax.  IMPRESSION: 1. Endotracheal  tube is now positioned. Tip is in the right mainstem bronchus. It will need to be retracted 3 cm for optimal positioning. Critical Value/emergent results were called by telephone at the time of interpretation on 10/03/2014 at 5:25 pm to the nurse in charge of this patient, who verbally acknowledged these results and will relay these results to respiratory. 2. Persistent right lung consolidation. Improved left lung aeration. 3. Orogastric tube well positioned curling within a mostly decompressed stomach.   Electronically Signed   By: Lajean Manes M.D.   On: 10/03/2014 17:26   Dg Chest Port 1 View  10/03/2014   CLINICAL DATA:  Status post central line adjustment  EXAM: PORTABLE CHEST - 1 VIEW  COMPARISON:  10/03/2014  FINDINGS: Cardiac shadow is stable. Increased density is noted over the left hemi thorax consistent with a posterior fusion. The previously seen central line is been repositioned and now lies within the proximal superior vena cava. No pneumothorax is noted. Patchy changes are seen within the lungs.  IMPRESSION: Patchy infiltrative changes are again identified bilaterally. Diffuse increased density is noted on the left which may be related to an evolving effusion. The central line has been repositioned without evidence of pneumothorax.   Electronically Signed   By: Inez Catalina M.D.   On: 10/03/2014 13:19   Dg Chest Port 1 View  10/03/2014   CLINICAL DATA:  Central line adjustment.  EXAM: PORTABLE CHEST - 1 VIEW  COMPARISON:  10/03/2014 at 10:18 a.m.  FINDINGS: Right sided central line curls overlying the right neck. This does not enter the thorax. It is not changed from the earlier study.  No change in the appearance of the lungs.  No pneumothorax.  IMPRESSION: Right central line is seen curling over the neck, not  entering thorax and not changed from the prior study.   Electronically Signed   By: Lajean Manes M.D.   On: 10/03/2014 12:13   Dg Chest Port 1 View  10/03/2014   CLINICAL DATA:   Central line placement  EXAM: PORTABLE CHEST - 1 VIEW  COMPARISON:  None.  FINDINGS: Cardiomediastinal silhouette is stable. Patchy airspace is right upper lobe left midlung and left lower lobe again noted suspicious for multifocal pneumonia. There is only partially visualized tortuous central line in right neck region. Clinical correlation is necessary. There is no pneumothorax.  IMPRESSION: Again noted patchy airspace disease bilaterally suspicious for multifocal pneumonia. Tortuous only partially visualized central line in right neck region. Probable significant retracted right IJ line. Clinical correlation is necessary. These results were called by telephone at the time of interpretation on 10/03/2014 at 10:53 am to ICU. Nurse Sheryn Bison , who verbally acknowledged these results.   Electronically Signed   By: Lahoma Crocker M.D.   On: 10/03/2014 10:55   Dg Chest Port 1 View  10/02/2014   CLINICAL DATA:  Shortness of breath, acute onset. Initial encounter.  EXAM: PORTABLE CHEST - 1 VIEW  COMPARISON:  Chest radiograph performed 09/11/2014  FINDINGS: The lungs are hypoexpanded. Patchy bilateral airspace opacities are noted, raising concern for multifocal pneumonia. Underlying asymmetric interstitial edema on the left side cannot be excluded. A small left pleural effusion is suspected. No pneumothorax is seen.  The cardiomediastinal silhouette is mildly enlarged. No acute osseous abnormalities are seen. A right IJ line is noted ending overlying the left brachiocephalic vein.  IMPRESSION: 1. Lungs hypoexpanded. Patchy bilateral airspace opacities raise concern for multifocal pneumonia. 2. Underlying asymmetric interstitial edema on the left side cannot be excluded. Suspect small left pleural effusion. 3. Mild cardiomegaly noted. 4. Right IJ line noted ending overlying the left brachiocephalic vein, as noted on the prior study. It appears to have been retracted mildly since the prior study.   Electronically Signed   By:  Garald Balding M.D.   On: 10/02/2014 20:31   Dg Chest Portable 1 View  09/24/2014   CLINICAL DATA:  Central line adjustment  EXAM: PORTABLE CHEST - 1 VIEW  COMPARISON:  Same day at 2:43 a.m.  FINDINGS: Shortening of the right IJ central line, but tip still terminates left of midline near the brachiocephalic vein.  Hypoventilation. Stable heart size and mediastinal contours, size accentuated by technique. There is interstitial crowding without edema, effusion, pneumothorax, or overt pneumonia.  IMPRESSION: 1. Despite right central line shortening the tip still terminates near the left brachiocephalic vein. 2. Hypoventilation.   Electronically Signed   By: Monte Fantasia M.D.   On: 09/05/2014 04:28   Dg Abd Portable 1v  10/03/2014   CLINICAL DATA:  OG tube placement  EXAM: PORTABLE ABDOMEN - 1 VIEW  COMPARISON:  CT abdomen pelvis dated 09/23/2014  FINDINGS: Enteric tube terminates in the gastric cardia.  Nonobstructive bowel gas pattern.  IMPRESSION: Enteric tube terminates in the gastric cardia.   Electronically Signed   By: Julian Hy M.D.   On: 10/03/2014 17:22    ASSESSMENT: Multisystem organ failure, amyloidosis.  PLAN:    1. Multisystem organ failure: Likely secondary to sepsis. Patient is actively receiving IVIG, although is unclear if this will be beneficial or not. 2. Amyloidosis: Previous diagnosed via kidney biopsy. No treatment is planned at this time, but would consider chemotherapy if patient fully recovers.  3. Thrombocytopenia: Does not appear to be DIC with elevated  fibrinogen, although it could be elevated as an acute phase reactant making it unreliable.  Patient does not have schistocytes on her peripheral smear, therefore TTP is unlikely. She does not require transfusion at this time, continue to monitor. 4. Elevated INR: Also multifactorial. Patient received FFP yesterday with improvement of her INR to 2.7. Continue to monitor. 5. Anemia:  Patient does not appear to be  hemolyzing although haptoglobin is currently pending. She does not require transfusion at this time, but maintain hemoglobin greater than 7.0.  Will follow.   Lloyd Huger, MD   10/04/2014 2:01 PM

## 2014-10-04 NOTE — Progress Notes (Signed)
Tolerating CRRT. No display of pain at this time. Aggitated with repositioning and mouth care. Purposeful but does not follow commands. NSR. 5cc UOP total this shift. Blood pressure stable on vaso and levo drips. o2 sats 99-100% on vent. Daughter in-law visited throughout shift.  Regla Fitzgibbon B

## 2014-10-05 DIAGNOSIS — J9601 Acute respiratory failure with hypoxia: Secondary | ICD-10-CM | POA: Insufficient documentation

## 2014-10-05 DIAGNOSIS — D649 Anemia, unspecified: Secondary | ICD-10-CM

## 2014-10-05 DIAGNOSIS — R6521 Severe sepsis with septic shock: Secondary | ICD-10-CM

## 2014-10-05 LAB — RENAL FUNCTION PANEL
ALBUMIN: 3.9 g/dL (ref 3.5–5.0)
ANION GAP: 5 (ref 5–15)
ANION GAP: 6 (ref 5–15)
Albumin: 3.8 g/dL (ref 3.5–5.0)
Albumin: 4.1 g/dL (ref 3.5–5.0)
Anion gap: 7 (ref 5–15)
BUN: 16 mg/dL (ref 6–20)
BUN: 17 mg/dL (ref 6–20)
BUN: 18 mg/dL (ref 6–20)
CHLORIDE: 106 mmol/L (ref 101–111)
CO2: 27 mmol/L (ref 22–32)
CO2: 28 mmol/L (ref 22–32)
CO2: 28 mmol/L (ref 22–32)
CREATININE: 0.39 mg/dL — AB (ref 0.44–1.00)
Calcium: 9 mg/dL (ref 8.9–10.3)
Calcium: 9 mg/dL (ref 8.9–10.3)
Calcium: 9.1 mg/dL (ref 8.9–10.3)
Chloride: 103 mmol/L (ref 101–111)
Chloride: 105 mmol/L (ref 101–111)
Creatinine, Ser: <0.3 mg/dL — ABNORMAL LOW (ref 0.44–1.00)
Creatinine, Ser: UNDETERMINED mg/dL (ref 0.44–1.00)
GFR calc Af Amer: >60 mL/min (ref >60)
GLUCOSE: 219 mg/dL — AB (ref 65–99)
Glucose, Bld: 201 mg/dL — ABNORMAL HIGH (ref 65–99)
Glucose, Bld: 206 mg/dL — ABNORMAL HIGH (ref 65–99)
POTASSIUM: 4 mmol/L (ref 3.5–5.1)
POTASSIUM: 4 mmol/L (ref 3.5–5.1)
Potassium: 4.1 mmol/L (ref 3.5–5.1)
SODIUM: 137 mmol/L (ref 135–145)
Sodium: 139 mmol/L (ref 135–145)
Sodium: 139 mmol/L (ref 135–145)

## 2014-10-05 LAB — GLUCOSE, CAPILLARY
GLUCOSE-CAPILLARY: 207 mg/dL — AB (ref 65–99)
GLUCOSE-CAPILLARY: 197 mg/dL — AB (ref 65–99)
GLUCOSE-CAPILLARY: 194 mg/dL — AB (ref 65–99)
GLUCOSE-CAPILLARY: 188 mg/dL — AB (ref 65–99)
GLUCOSE-CAPILLARY: 207 mg/dL — AB (ref 65–99)
Glucose-Capillary: 187 mg/dL — ABNORMAL HIGH (ref 65–99)
Glucose-Capillary: 186 mg/dL — ABNORMAL HIGH (ref 65–99)
Glucose-Capillary: 213 mg/dL — ABNORMAL HIGH (ref 65–99)

## 2014-10-05 LAB — TYPE AND SCREEN
ABO/RH(D): A POS
ANTIBODY SCREEN: NEGATIVE
UNIT DIVISION: 0
UNIT DIVISION: 0
Unit division: 0
Unit division: 0

## 2014-10-05 LAB — HEPATIC FUNCTION PANEL
ALT: 24 U/L (ref 14–54)
AST: 60 U/L — ABNORMAL HIGH (ref 15–41)
Albumin: 4 g/dL (ref 3.5–5.0)
Alkaline Phosphatase: 266 U/L — ABNORMAL HIGH (ref 38–126)
Bilirubin, Direct: 15.5 mg/dL — ABNORMAL HIGH (ref 0.1–0.5)
Indirect Bilirubin: 5.1 mg/dL — ABNORMAL HIGH (ref 0.3–0.9)
TOTAL PROTEIN: 7.1 g/dL (ref 6.5–8.1)
Total Bilirubin: 20.6 mg/dL (ref 0.3–1.2)

## 2014-10-05 LAB — PREPARE FRESH FROZEN PLASMA
Unit division: 0
Unit division: 0

## 2014-10-05 LAB — MAGNESIUM: MAGNESIUM: 1.6 mg/dL — AB (ref 1.7–2.4)

## 2014-10-05 LAB — CBC
HEMATOCRIT: 18.5 % — AB (ref 35.0–47.0)
Hemoglobin: 6.3 g/dL — ABNORMAL LOW (ref 12.0–16.0)
MCH: 31.3 pg (ref 26.0–34.0)
MCHC: 34.2 g/dL (ref 32.0–36.0)
MCV: 91.5 fL (ref 80.0–100.0)
Platelets: 43 10*3/uL — ABNORMAL LOW (ref 150–440)
RBC: 2.02 MIL/uL — AB (ref 3.80–5.20)
RDW: 18 % — ABNORMAL HIGH (ref 11.5–14.5)
WBC: 20.3 10*3/uL — AB (ref 3.6–11.0)

## 2014-10-05 LAB — HAPTOGLOBIN: HAPTOGLOBIN: 32 mg/dL — AB (ref 34–200)

## 2014-10-05 LAB — PREPARE RBC (CROSSMATCH)

## 2014-10-05 LAB — PROTIME-INR
INR: 3
Prothrombin Time: 31.2 seconds — ABNORMAL HIGH (ref 11.4–15.0)

## 2014-10-05 LAB — APTT: aPTT: 50 seconds — ABNORMAL HIGH (ref 24–36)

## 2014-10-05 MED ORDER — SODIUM CHLORIDE 0.9 % IV SOLN
Freq: Once | INTRAVENOUS | Status: AC
  Administered 2014-10-05: 19:00:00 via INTRAVENOUS

## 2014-10-05 MED ORDER — LEVOFLOXACIN IN D5W 750 MG/150ML IV SOLN
750.0000 mg | INTRAVENOUS | Status: DC
  Administered 2014-10-05: 750 mg via INTRAVENOUS
  Filled 2014-10-05 (×3): qty 150

## 2014-10-05 MED ORDER — SODIUM CHLORIDE 0.9 % IV SOLN
Freq: Once | INTRAVENOUS | Status: AC
  Administered 2014-10-05: 17:00:00 via INTRAVENOUS

## 2014-10-05 MED ORDER — TRACE MINERALS CR-CU-MN-SE-ZN 10-1000-500-60 MCG/ML IV SOLN
INTRAVENOUS | Status: DC
  Administered 2014-10-05: 20:00:00 via INTRAVENOUS
  Filled 2014-10-05: qty 1680

## 2014-10-05 NOTE — Progress Notes (Signed)
ANTIBIOTIC CONSULT NOTE - INITIAL  Pharmacy Consult for levaquin Indication: strep cellulitis  No Known Allergies  Patient Measurements: Height: 5\' 1"  (154.9 cm) Weight: 210 lb 4.8 oz (95.391 kg) IBW/kg (Calculated) : 47.8 Adjusted Body Weight:   Vital Signs: Temp: 97.5 F (36.4 C) (06/01 1400) Temp Source: Rectal (06/01 1200) BP: 90/67 mmHg (06/01 1400) Pulse Rate: 94 (06/01 1400) Intake/Output from previous day: 05/31 0701 - 06/01 0700 In: 3368.3 [I.V.:1531; NG/GT:270; IV Piggyback:650; TPN:917.3] Out: 4116 [Urine:5; Emesis/NG output:400] Intake/Output from this shift: Total I/O In: 851.7 [I.V.:311.7; NG/GT:120; IV Piggyback:100; TPN:320] Out: 989 [Other:989]  Labs:  Recent Labs  10/03/14 0558 10/03/14 1736  10/04/14 1148 10/04/14 1705 10/04/14 2310 10/05/14 0614  WBC 17.2*  --   --  16.7*  --   --  20.3*  HGB 7.9* 7.9*  --  7.3*  --   --  6.3*  PLT 47*  --   --  44*  --   --  43*  CREATININE 0.75 0.51  < > 0.33* 0.32* 0.39* <0.30*  < > = values in this interval not displayed. CrCl cannot be calculated (Patient has no serum creatinine result on file.). No results for input(s): VANCOTROUGH, VANCOPEAK, VANCORANDOM, GENTTROUGH, GENTPEAK, GENTRANDOM, TOBRATROUGH, TOBRAPEAK, TOBRARND, AMIKACINPEAK, AMIKACINTROU, AMIKACIN in the last 72 hours.   Microbiology: Recent Results (from the past 720 hour(s))  Blood culture (routine x 2)     Status: None   Collection Time: 09/05/2014 12:45 AM  Result Value Ref Range Status   Specimen Description BLOOD  Final   Special Requests NONE  Final   Culture  Setup Time   Final    GRAM POSITIVE COCCI IN CHAINS IN BOTH AEROBIC AND ANAEROBIC BOTTLES CRITICAL RESULT CALLED TO, READ BACK BY AND VERIFIED WITH: PAM CRAWFORD AT 1406 09/12/2014 DV    Culture STREPTOCOCCUS SPECIES  Final   Report Status 10/01/2014 FINAL  Final   Organism ID, Bacteria STREPTOCOCCUS SPECIES  Final      Susceptibility   Streptococcus species - MIC (ETEST)*     ERYTHROMYCIN Value in next row Resistant      RESISTANT6    PENICILLIN Value in next row Intermediate      INTERMEDIATE0.25    CEFTRIAXONE Value in next row Sensitive      SENSITIVE0.047    VANCOMYCIN Value in next row Sensitive      SENSITIVE1.0    LEVOFLOXACIN Value in next row Sensitive      SENSITIVE1.0    * STREPTOCOCCUS SPECIES  Blood culture (routine x 2)     Status: None   Collection Time: 09/14/2014 12:45 AM  Result Value Ref Range Status   Specimen Description BLOOD  Final   Special Requests NONE  Final   Culture  Setup Time   Final    GRAM POSITIVE COCCI IN CHAINS IN BOTH AEROBIC AND ANAEROBIC BOTTLES CRITICAL RESULT CALLED TO, READ BACK BY AND VERIFIED WITH: PAM CRAWFORD AT 1406 10/04/2014 DV    Culture   Final    STREPTOCOCCUS SPECIES REFER TO ACCESSION [id number] COLLECTED ON 09/30/2014 AT 0045 FOR SENSITIVITIES    Report Status 09/30/2014 FINAL  Final  Culture, respiratory (NON-Expectorated)     Status: None (Preliminary result)   Collection Time: 10/04/14  2:00 PM  Result Value Ref Range Status   Specimen Description INDUCED SPUTUM  Final   Special Requests Normal  Final   Gram Stain PENDING  Incomplete   Culture   Final    MODERATE  GROWTH YEAST IDENTIFICATION TO FOLLOW ONCE BETTER GROWTH    Report Status PENDING  Incomplete    Medical History: Past Medical History  Diagnosis Date  . HTN (hypertension)   . Diabetes mellitus type II   . Obesity   . Arthritis   . Chronic kidney disease   . Amyloidosis     Medications:  Scheduled:  . sodium chloride   Intravenous Once  . sodium chloride   Intravenous Once  . antiseptic oral rinse  7 mL Mouth Rinse QID  . chlorhexidine  15 mL Mouth Rinse BID  . clindamycin (CLEOCIN) IV  600 mg Intravenous 3 times per day  . hydrocortisone sod succinate (SOLU-CORTEF) inj  50 mg Intravenous Q12H  . insulin aspart  0-9 Units Subcutaneous Q4H  . lactulose  20 g Oral BID  . levofloxacin (LEVAQUIN) IV  750 mg Intravenous Q24H  .  midodrine  10 mg Oral TID  . pantoprazole (PROTONIX) IV  80 mg Intravenous Q12H  . sodium chloride  10-40 mL Intracatheter Q12H   Anti-infectives    Start     Dose/Rate Route Frequency Ordered Stop   10/05/14 1430  levofloxacin (LEVAQUIN) IVPB 750 mg     750 mg 100 mL/hr over 90 Minutes Intravenous Every 24 hours 10/05/14 1420     10/01/14 1800  cefTRIAXone (ROCEPHIN) 2 g in dextrose 5 % 50 mL IVPB - Premix  Status:  Discontinued     2 g 100 mL/hr over 30 Minutes Intravenous Every 24 hours 09/30/14 1621 10/05/14 1409   09/30/14 1630  cefTRIAXone (ROCEPHIN) 1 g in dextrose 5 % 50 mL IVPB - Premix  Status:  Discontinued     1 g 100 mL/hr over 30 Minutes Intravenous Every 24 hours 09/30/14 1621 10/01/14 1114   09/30/14 1545  cefTRIAXone (ROCEPHIN) 1 g in dextrose 5 % 50 mL IVPB - Premix  Status:  Discontinued     1 g 100 mL/hr over 30 Minutes Intravenous Every 24 hours 09/30/14 1535 09/30/14 1621   09/30/14 1545  clindamycin (CLEOCIN) IVPB 600 mg     600 mg 100 mL/hr over 30 Minutes Intravenous 3 times per day 09/30/14 1535     09/21/2014 1300  vancomycin (VANCOCIN) 1,250 mg in sodium chloride 0.9 % 250 mL IVPB  Status:  Discontinued     1,250 mg 166.7 mL/hr over 90 Minutes Intravenous Every 24 hours 09/26/2014 0514 09/04/2014 1200   09/09/2014 1300  vancomycin (VANCOCIN) IVPB 1000 mg/200 mL premix  Status:  Discontinued     1,000 mg 200 mL/hr over 60 Minutes Intravenous Every 24 hours 09/12/2014 1200 09/30/14 1535   09/12/2014 0600  piperacillin-tazobactam (ZOSYN) IVPB 3.375 g  Status:  Discontinued     3.375 g 12.5 mL/hr over 240 Minutes Intravenous 3 times per day 09/07/2014 0508 09/29/14 1019   10/03/2014 0045  vancomycin (VANCOCIN) IVPB 1000 mg/200 mL premix     1,000 mg 200 mL/hr over 60 Minutes Intravenous  Once 09/17/2014 0043 09/10/2014 0229   10/02/2014 0045  piperacillin-tazobactam (ZOSYN) IVPB 3.375 g     3.375 g 12.5 mL/hr over 240 Minutes Intravenous  Once 09/04/2014 0043 09/24/2014 0251      Assessment: Pharmacy consulted to dose levaquin in this 64yo F being treated for strep cellulitis and possible aspiration PNA. Ceftriaxone was changed by ID to levaquin to treat the strep, and clindamycin is continued to cover possible aspiration pneumonia as well as decrease toxin production.   Goal of Therapy:  Resolution of infection  Plan:  Levaquin 750mg  IV Q24hrs ordered. Pharmacy will continue to follow. Follow up culture results  Darlene Brozowski K 10/05/2014,2:21 PM

## 2014-10-05 NOTE — Progress Notes (Signed)
Subjective:  Pt seen at bedside. Remains on CRRT and remains critically ill. Was net negative yesterday.  Objective:  Vital signs in last 24 hours:  Temp:  [97 F (36.1 C)-98.8 F (37.1 C)] 98.2 F (36.8 C) (06/01 0800) Pulse Rate:  [88-98] 93 (06/01 0800) Resp:  [12-34] 20 (06/01 0800) BP: (81-111)/(53-76) 96/68 mmHg (06/01 0800) SpO2:  [85 %-100 %] 94 % (06/01 0800) FiO2 (%):  [30 %-40 %] 30 % (06/01 0830) Weight:  [95.391 kg (210 lb 4.8 oz)] 95.391 kg (210 lb 4.8 oz) (06/01 0500)  Weight change: 0.092 kg (3.2 oz) Filed Weights   10/03/14 0604 10/04/14 0458 10/05/14 0500  Weight: 100.7 kg (222 lb 0.1 oz) 95.3 kg (210 lb 1.6 oz) 95.391 kg (210 lb 4.8 oz)    Intake/Output: I/O last 3 completed shifts: In: 7425 [I.V.:3799.4; NG/GT:270; IV Piggyback:1000] Out: 5510 [Urine:5; Emesis/NG output:1100; ZDGLO:7564]     Physical Exam: General: Critically ill appearing  HEENT Scleral icterus noted, ETT in place  Neck supple  Pulm/lungs Decreased breath sounds at bases, vent assisted  CVS/Heart S1S2 no rubs  Abdomen:  Soft, non tender, BS present  Extremities: +++ anasarca  Neurologic: On the vent  Skin: Warm/dry  Access: Temp cath- rt femoral       Basic Metabolic Panel:  Recent Labs Lab 09/30/14 1345  10/01/14 0611  10/01/14 2303 10/02/14 0151 10/02/14 3329  10/02/14 1540 10/03/14 0558  10/03/14 2308 10/04/14 0818 10/04/14 1148 10/04/14 1705 10/04/14 2310 10/05/14 0614  NA 133*  < > 132*  < > 135  --  133*  < > 135 137  < > 137  --  138 137 137 139  K 4.3  < > 4.2  < > 4.5  --  4.5  < > 5.1 3.6  < > 3.8  --  4.1 4.0 4.1 4.0  CL 101  < > 99*  < > 101  --  102  < > 101 104  < > 101  --  103 102 103 106  CO2 25  < > 25  < > 25  --  25  < > 26 26  < > 26  --  27 28 27 28   GLUCOSE 111*  < > 122*  < > 163*  --  184*  < > 181* 163*  < > 210*  --  251* 234* 219* 201*  BUN 16  < > 18  < > 22*  --  25*  < > 27* 23*  < > 21*  --  18 18 16 17   CREATININE 1.01*  < >  1.15*  < > 1.21*  --  1.16*  < > 1.19* 0.75  < > 0.43*  --  0.33* 0.32* 0.39* <0.30*  CALCIUM 7.9*  < > 8.0*  < > 8.2*  --  8.2*  < > 8.3* 8.2*  < > 8.6*  --  9.0 8.7* 9.0 9.0  MG 1.8  --  1.8  --   --  1.7  --   --   --  1.5*  --   --  1.8  --   --   --   --   PHOS 1.9*  < > 2.1*  < > 2.5  --  2.3*  --  3.1  --   --   --  ICTERUS AT THIS LEVEL MAY AFFECT RESULT  --  SEE COMMENTS  --   --   < > =  values in this interval not displayed.   CBC:  Recent Labs Lab 10/02/14 8841 10/02/14 1030 10/03/14 0558 10/03/14 1736 10/04/14 1148 10/05/14 0614  WBC 26.0* 24.6* 17.2*  --  16.7* 20.3*  NEUTROABS  --  21.1*  --   --   --   --   HGB 6.1* 5.9* 7.9* 7.9* 7.3* 6.3*  HCT 19.1* 18.6* 23.9*  --  21.4* 18.5*  MCV 88.7 88.6 86.7  --  87.8 91.5  PLT 74* 68* 47*  --  44* 43*      Microbiology: Results for orders placed or performed during the hospital encounter of 10/03/2014  Blood culture (routine x 2)     Status: None   Collection Time: 10/04/2014 12:45 AM  Result Value Ref Range Status   Specimen Description BLOOD  Final   Special Requests NONE  Final   Culture  Setup Time   Final    GRAM POSITIVE COCCI IN CHAINS IN BOTH AEROBIC AND ANAEROBIC BOTTLES CRITICAL RESULT CALLED TO, READ BACK BY AND VERIFIED WITH: PAM CRAWFORD AT 1406 09/20/2014 DV    Culture STREPTOCOCCUS SPECIES  Final   Report Status 10/01/2014 FINAL  Final   Organism ID, Bacteria STREPTOCOCCUS SPECIES  Final      Susceptibility   Streptococcus species - MIC (ETEST)*    ERYTHROMYCIN Value in next row Resistant      RESISTANT6    PENICILLIN Value in next row Intermediate      INTERMEDIATE0.25    CEFTRIAXONE Value in next row Sensitive      SENSITIVE0.047    VANCOMYCIN Value in next row Sensitive      SENSITIVE1.0    LEVOFLOXACIN Value in next row Sensitive      SENSITIVE1.0    * STREPTOCOCCUS SPECIES  Blood culture (routine x 2)     Status: None   Collection Time: 09/06/2014 12:45 AM  Result Value Ref Range Status    Specimen Description BLOOD  Final   Special Requests NONE  Final   Culture  Setup Time   Final    GRAM POSITIVE COCCI IN CHAINS IN BOTH AEROBIC AND ANAEROBIC BOTTLES CRITICAL RESULT CALLED TO, READ BACK BY AND VERIFIED WITH: PAM CRAWFORD AT 1406 09/18/2014 DV    Culture   Final    STREPTOCOCCUS SPECIES REFER TO ACCESSION [id number] COLLECTED ON 09/21/2014 AT 0045 FOR SENSITIVITIES    Report Status 09/30/2014 FINAL  Final  Culture, respiratory (NON-Expectorated)     Status: None (Preliminary result)   Collection Time: 10/04/14  2:00 PM  Result Value Ref Range Status   Specimen Description INDUCED SPUTUM  Final   Special Requests Normal  Final   Gram Stain PENDING  Incomplete   Culture   Final    MODERATE GROWTH YEAST IDENTIFICATION TO FOLLOW ONCE BETTER GROWTH    Report Status PENDING  Incomplete    Coagulation Studies:  Recent Labs  10/03/14 0558 10/04/14 1148  LABPROT 27.5* 28.7*  INR 2.55 2.69    Urinalysis: No results for input(s): COLORURINE, LABSPEC, PHURINE, GLUCOSEU, HGBUR, BILIRUBINUR, KETONESUR, PROTEINUR, UROBILINOGEN, NITRITE, LEUKOCYTESUR in the last 72 hours.  Invalid input(s): APPERANCEUR    Imaging: Dg Chest Port 1 View  10/03/2014   CLINICAL DATA:  Orogastric and endotracheal tube placement.  EXAM: PORTABLE CHEST - 1 VIEW  COMPARISON:  10/03/2014 at 12:51 p.m.  FINDINGS: Orogastric tube passes below the diaphragm to curl within a mostly decompressed stomach.  Endotracheal tube tip projects the right mainstem bronchus.  Patchy airspace consolidation is noted in the peripheral right upper mid and lower lung and inferior to the right hilum.  Hazy left sided opacity noted previously has mostly resolved. There is mild residual opacity at the left lung base likely atelectasis. There is no pulmonary edema.  No pneumothorax.  IMPRESSION: 1. Endotracheal tube is now positioned. Tip is in the right mainstem bronchus. It will need to be retracted 3 cm for optimal positioning.  Critical Value/emergent results were called by telephone at the time of interpretation on 10/03/2014 at 5:25 pm to the nurse in charge of this patient, who verbally acknowledged these results and will relay these results to respiratory. 2. Persistent right lung consolidation. Improved left lung aeration. 3. Orogastric tube well positioned curling within a mostly decompressed stomach.   Electronically Signed   By: Lajean Manes M.D.   On: 10/03/2014 17:26   Dg Chest Port 1 View  10/03/2014   CLINICAL DATA:  Status post central line adjustment  EXAM: PORTABLE CHEST - 1 VIEW  COMPARISON:  10/03/2014  FINDINGS: Cardiac shadow is stable. Increased density is noted over the left hemi thorax consistent with a posterior fusion. The previously seen central line is been repositioned and now lies within the proximal superior vena cava. No pneumothorax is noted. Patchy changes are seen within the lungs.  IMPRESSION: Patchy infiltrative changes are again identified bilaterally. Diffuse increased density is noted on the left which may be related to an evolving effusion. The central line has been repositioned without evidence of pneumothorax.   Electronically Signed   By: Inez Catalina M.D.   On: 10/03/2014 13:19   Dg Chest Port 1 View  10/03/2014   CLINICAL DATA:  Central line adjustment.  EXAM: PORTABLE CHEST - 1 VIEW  COMPARISON:  10/03/2014 at 10:18 a.m.  FINDINGS: Right sided central line curls overlying the right neck. This does not enter the thorax. It is not changed from the earlier study.  No change in the appearance of the lungs.  No pneumothorax.  IMPRESSION: Right central line is seen curling over the neck, not entering thorax and not changed from the prior study.   Electronically Signed   By: Lajean Manes M.D.   On: 10/03/2014 12:13   Dg Chest Port 1 View  10/03/2014   CLINICAL DATA:  Central line placement  EXAM: PORTABLE CHEST - 1 VIEW  COMPARISON:  None.  FINDINGS: Cardiomediastinal silhouette is stable.  Patchy airspace is right upper lobe left midlung and left lower lobe again noted suspicious for multifocal pneumonia. There is only partially visualized tortuous central line in right neck region. Clinical correlation is necessary. There is no pneumothorax.  IMPRESSION: Again noted patchy airspace disease bilaterally suspicious for multifocal pneumonia. Tortuous only partially visualized central line in right neck region. Probable significant retracted right IJ line. Clinical correlation is necessary. These results were called by telephone at the time of interpretation on 10/03/2014 at 10:53 am to ICU. Nurse Sheryn Bison , who verbally acknowledged these results.   Electronically Signed   By: Lahoma Crocker M.D.   On: 10/03/2014 10:55   Dg Abd Portable 1v  10/03/2014   CLINICAL DATA:  OG tube placement  EXAM: PORTABLE ABDOMEN - 1 VIEW  COMPARISON:  CT abdomen pelvis dated 09/23/2014  FINDINGS: Enteric tube terminates in the gastric cardia.  Nonobstructive bowel gas pattern.  IMPRESSION: Enteric tube terminates in the gastric cardia.   Electronically Signed   By: Julian Hy M.D.   On: 10/03/2014  17:22     Medications:   . Marland KitchenTPN (CLINIMIX-E) Adult 40 mL/hr at 10/04/14 1904  . fentaNYL infusion INTRAVENOUS 150 mcg/hr (10/05/14 0358)  . norepinephrine (LEVOPHED) Adult infusion 16 mcg/min (10/05/14 0534)  . pureflow 2,000 mL/hr at 10/05/14 0708  . vasopressin (PITRESSIN) infusion - *FOR SHOCK* 0.03 Units/min (10/04/14 1104)   . acetaminophen  650 mg Oral Daily  . albumin human  25 g Intravenous Q6H  . antiseptic oral rinse  7 mL Mouth Rinse QID  . cefTRIAXone (ROCEPHIN)  IV  2 g Intravenous Q24H  . chlorhexidine  15 mL Mouth Rinse BID  . clindamycin (CLEOCIN) IV  600 mg Intravenous 3 times per day  . hydrocortisone sod succinate (SOLU-CORTEF) inj  50 mg Intravenous Q12H  . insulin aspart  0-9 Units Subcutaneous Q4H  . lactulose  20 g Oral BID  . midodrine  10 mg Oral TID  . pantoprazole (PROTONIX)  IV  80 mg Intravenous Q12H  . sodium chloride  10-40 mL Intracatheter Q12H   acetaminophen **OR** acetaminophen, albuterol, heparin, morphine injection, ondansetron **OR** ondansetron (ZOFRAN) IV, polyethylene glycol powder, sodium chloride  Assessment/ Plan:   64 y.o. African [place] female with diabetes mellitus type 2, hypertension, morbid obesity, osteoarthritis, status post right knee surgery, nephrotic syndrome with edema, hypoalbuminemia, renal insufficiency and proteinuria.  1. ARF with CKD st 3 . Baseline cr 1.3. (08/2014)  2. Nephrotic Syndrome with proteinuria: Renal Amyloidosis.  3. Generalized Edema 4. Hypoalbuminemia 5. Right groin cellulitis /sepsis- strep species  6. Diabetes Mellitus type II 7. Hypotension  Plan: Oligoanuria persists.Still on pressors.  Was net negative yesterday.  Therefore will continue CRRT with UF target of 175cc/hr.  Continue additional supportive care with mechanical ventilation, nutritional support, antibiotic therapy.  No chemotherapy to be offered at this time.  Continue aggressive care for now but palliative care following.  LOS: 7 Shaman Muscarella 6/1/20168:37 AM

## 2014-10-05 NOTE — Consult Note (Signed)
Palliative Medicine Inpatient Consult Follow Up Note   Name: [name] Date: 10/05/2014 MRN: [id number]  DOB: 08-24-1950  Referring Physician: Aldean Jewett, MD  Palliative Care consult requested for this 64 y.o. female for goals of medical therapy in patient with amyloidosis (markers - for multiple myeloma), CKD stage III, T2DM, HTN, h/o uterine fibroids, and s/p total abdominal hysterectomy.   Pt currently resting in bed and non responsive to stimuli. Pt currently on ventilator. Pt receiving TPN, continuous albumin drip and 2 pressors.     REVIEW OF SYSTEMS:  Patient is not able to provide ROS  CODE STATUS: Full code   PAST MEDICAL HISTORY: Past Medical History  Diagnosis Date  . HTN (hypertension)   . Diabetes mellitus type II   . Obesity   . Arthritis   . Chronic kidney disease   . Amyloidosis     PAST SURGICAL HISTORY:  Past Surgical History  Procedure Laterality Date  . Total abdominal hysterectomy  10/1999    fibroids  . Combined abdominoplasty and liposuction  07/2000  . Total knee arthroplasty Right 09/28/2013    Procedure: RIGHT TOTAL KNEE ARTHROPLASTY;  Surgeon: Gina Pole, MD;  Location: [facility];  Service: Orthopedics;  Laterality: Right;    Vital Signs: BP 88/59 mmHg  Pulse 94  Temp(Src) 97.7 F (36.5 C) (Rectal)  Resp 23  Ht _0  (1.549 m)  Wt 95.391 kg (210 lb 4.8 oz)  BMI 39.76 kg/m2  SpO2 91% Filed Weights   10/03/14 0604 10/04/14 0458 10/05/14 0500  Weight: 100.7 kg (222 lb 0.1 oz) 95.3 kg (210 lb 1.6 oz) 95.391 kg (210 lb 4.8 oz)    Estimated body mass index is 39.76 kg/(m^2) as calculated from the following:   Height as of this encounter: _1  (1.549 m).   Weight as of this encounter: 95.391 kg (210 lb 4.8 oz).  PHYSICAL EXAM: [place]: Critically ill appearing HEENT: OP clear, ETT present Neck: Trachea midline  Cardiovascular: regular rate and rhythm Pulmonary/Chest: crackles to bilat ant fields Abdominal: Soft.  Hypoactive bowel sounds GU: Foley present, scant dark urine Extremities: 3 + pitting edema BLE's and BUE's Neurological: Opens eyes to voice, moves extremities Skin: Warm, dry and intact. R flank wound with gauze, CDI Psychiatric: Unable to assess  LABS: CBC:    Component Value Date/Time   WBC 20.3* 10/05/2014 0614   WBC 9.2 08/22/2014 1712   HGB 6.3* 10/05/2014 0614   HGB 12.3 08/22/2014 1712   HCT 18.5* 10/05/2014 0614   HCT 38.3 08/22/2014 1712   PLT 43* 10/05/2014 0614   PLT 503* 08/22/2014 1712   MCV 91.5 10/05/2014 0614   MCV 87 08/22/2014 1712   NEUTROABS 21.1* 10/02/2014 1030   NEUTROABS 4.0 08/22/2014 1712   LYMPHSABS 1.5 10/02/2014 1030   LYMPHSABS 4.2* 08/22/2014 1712   MONOABS 2.0* 10/02/2014 1030   MONOABS 0.9 08/22/2014 1712   EOSABS 0.0 10/02/2014 1030   EOSABS 0.0 08/22/2014 1712   BASOSABS 0.0 10/02/2014 1030   BASOSABS 0.1 08/22/2014 1712   Comprehensive Metabolic Panel:    Component Value Date/Time   NA 139 10/05/2014 0614   NA 136 08/22/2014 1712   K 4.0 10/05/2014 0614   K 2.6* 08/22/2014 1712   CL 106 10/05/2014 0614   CL 92* 08/22/2014 1712   CO2 28 10/05/2014 0614   CO2 35* 08/22/2014 1712   BUN 17 10/05/2014 0614   BUN 29* 08/22/2014 1712   CREATININE <0.30* 10/05/2014 3500  CREATININE 1.78* 08/22/2014 1712   GLUCOSE 201* 10/05/2014 0614   GLUCOSE 167* 08/22/2014 1712   CALCIUM 9.0 10/05/2014 0614   CALCIUM 8.2* 08/22/2014 1712   AST 60* 10/05/2014 0614   AST 40 08/22/2014 1712   ALT 24 10/05/2014 0614   ALT 22 08/22/2014 1712   ALKPHOS 266* 10/05/2014 0614   ALKPHOS 294* 08/22/2014 1712   BILITOT 20.6* 10/05/2014 0614   PROT 7.1 10/05/2014 0614   PROT 5.0* 08/22/2014 1712   ALBUMIN 4.0 10/05/2014 0614   ALBUMIN 4.1 10/05/2014 0614   ALBUMIN 1.7* 08/22/2014 1712    IMPRESSION:  [name] is a 64 y.o. female with amyloidosis (markers - for multiple myeloma), CKD stage III, T2DM, HTN, h/o uterine fibroids, and s/p total  abdominal hysterectomy. Pt presented to ER from home with AMS. ER workup significant for lactic acidosis, severe hypoalbuminemia, and ARF. Pt admitted to CCU for treatment and evaluation of sepsis. Family at bedside.  Pt with increase in leukocytosis, increase in bilirubin, thrombocytopenia and drop in hemoglobin this AM.  Pt on 2 pressors and CRRT.  Pt is currently on vent at 30% FiO2 and 5 PEEP.  Pt remains critically ill.  Family at bedside.  PLAN: 1. Full Code 2. Cont current care   More than 50% of the visit was spent in counseling/coordination of care: YES  Time Spent: 25 minutes

## 2014-10-05 NOTE — Progress Notes (Signed)
RN made Dr. Holley Raring aware of serous drainage coming from around dialysis insertion site. Dr. Holley Raring acknowledged stating "It's the 3rd space shifting, its nothing really to do." no new orders.  [name] B

## 2014-10-05 NOTE — Progress Notes (Signed)
SaO2 84-87%.  Pt repositioned and suctioned w/o any improvement.  RT contacted to increase vent FiO2.

## 2014-10-05 NOTE — Progress Notes (Signed)
[place] [facility] [place] NAME: [name]    MR#:  [id number]  DATE OF BIRTH:  04/25/1951  SUBJECTIVE:  CHIEF COMPLAINT:   Chief Complaint  Patient presents with  . Altered Mental Status    Critically ill. Continues on 2 pressors.   REVIEW OF SYSTEMS:    ROS  Unable to give history due to encephalopathy  DRUG ALLERGIES:  No Known Allergies  VITALS:  Blood pressure 90/67, pulse 94, temperature 97.5 F (36.4 C), temperature source Rectal, resp. rate 21, height 5\' 1"  (1.549 m), weight 95.391 kg (210 lb 4.8 oz), SpO2 92 %.  PHYSICAL EXAMINATION:   Physical Exam  GENERAL:  64 y.o.-year-old patient lying in the bed critically ill EYES: Pupils equal, round, reactive to light and accommodation. + scleral icterus.  HEENT: Head atraumatic, normocephalic.Intubated NECK:  Supple, no jugular venous distention. No thyroid enlargement, no tenderness. Right IJ  LUNGS: coarse breath sounds, poor air movement, vent CARDIOVASCULAR: S1, S2 normal. No murmurs, rubs, or gallops. ABDOMEN: Soft, nontender, nondistended. Bowel sounds decreased. No organomegaly or mass. EXTREMITIES: No cyanosis, clubbing. Anasarca.  NEUROLOGIC: Drowzy.lethargic.  PSYCHIATRIC: The patient is drowzy. SKIN:  Erythema with warmth right abdominal wall, groin,right lateral thigh. Superficial ulcer right thigh.  LABORATORY PANEL:   CBC  Recent Labs Lab 10/05/14 0614  WBC 20.3*  HGB 6.3*  HCT 18.5*  PLT 43*   ------------------------------------------------------------------------------------------------------------------  Chemistries   Recent Labs Lab 10/05/14 0614  NA 139  K 4.0  CL 106  CO2 28  GLUCOSE 201*  BUN 17  CREATININE <0.30*  CALCIUM 9.0  MG 1.6*  AST 60*  ALT 24  ALKPHOS 266*  BILITOT 20.6*   ------------------------------------------------------------------------------------------------------------------  Cardiac Enzymes No  results for input(s): TROPONINI in the last 168 hours. ------------------------------------------------------------------------------------------------------------------  RADIOLOGY:  Dg Chest Port 1 View  10/03/2014   CLINICAL DATA:  Orogastric and endotracheal tube placement.  EXAM: PORTABLE CHEST - 1 VIEW  COMPARISON:  10/03/2014 at 12:51 p.m.  FINDINGS: Orogastric tube passes below the diaphragm to curl within a mostly decompressed stomach.  Endotracheal tube tip projects the right mainstem bronchus.  Patchy airspace consolidation is noted in the peripheral right upper mid and lower lung and inferior to the right hilum.  Hazy left sided opacity noted previously has mostly resolved. There is mild residual opacity at the left lung base likely atelectasis. There is no pulmonary edema.  No pneumothorax.  IMPRESSION: 1. Endotracheal tube is now positioned. Tip is in the right mainstem bronchus. It will need to be retracted 3 cm for optimal positioning. Critical Value/emergent results were called by telephone at the time of interpretation on 10/03/2014 at 5:25 pm to the nurse in charge of this patient, who verbally acknowledged these results and will relay these results to respiratory. 2. Persistent right lung consolidation. Improved left lung aeration. 3. Orogastric tube well positioned curling within a mostly decompressed stomach.   Electronically Signed   By: Lajean Manes M.D.   On: 10/03/2014 17:26   Dg Abd Portable 1v  10/03/2014   CLINICAL DATA:  OG tube placement  EXAM: PORTABLE ABDOMEN - 1 VIEW  COMPARISON:  CT abdomen pelvis dated 09/23/2014  FINDINGS: Enteric tube terminates in the gastric cardia.  Nonobstructive bowel gas pattern.  IMPRESSION: Enteric tube terminates in the gastric cardia.   Electronically Signed   By: Julian Hy M.D.   On: 10/03/2014 17:22     ASSESSMENT AND PLAN:  64 year old African American female history of nephrotic syndrome, amyloidosis, essential hypertension  presenting with altered mental status/confusion after apparently falling out of bed. Admitted for septic shock  * Septic shock with right flank/thigh cellulitis with streptococcus bacteremia - ID following - Changed to Levaquin and clindamycin. - Echo- No vegetations. May need TEE - Repeat blood cultures pending - continue stress dose steroids, pressors - Checked IgG level and are low as expected. [name] discussed with [name] with Oncology regarding replacement. Receiving albumen every 6 hours  * Acute renal failure over CKD3 due to ATN. From septic shock. On CRRT. Appreciate nephrology help. Severe hypoalbuminemia. Getting IV albumin. Will likely progress to ESRD.  * Acute liver failure- Due to sepsis Ct abd showed no biliary dilation or obstruction Severe hyperbilirubinemia at 20. Gastroenterology consult has been requested lactulose  * Elevated INR Due to liver failure. DIC unlikely per oncology. 4 units FFP given today  * Acute encephalopathy - Due to acute illness. CT head nothing acute.  * Amyloidosis Etiology unknown, unclear primary. Discussed with Dr. Grayland Ormond. Plan was for chemo as OP but now on hold due to acute illness.  * Acute anemia over AOCD Likely from slow GI bleed as well as renal disease, bone marrow disease and sepsis Continued black output from OG On protonix. Luminal evaluation cannot be done with high INR and acute illness. Transfused 2 units on 5/28, transfuse another unit today goal hemoglobin greater than 7  * Type 2 diabetes, uncomplicated: Hold oral agents, at insulin slide scale every 6 hours Accu-Chek  * Essential hypertension: Held all meds  * Venous thromboembolism prophylactic: Heparin subcutaneous stopped due to high INR and low platelets  * Nutrition NG would be risky with high INR for bleeding. Started TPN  * Acute respiratory failure due to suspected aspiration of gastrointestinal content, continue supportive therapy with  mechanical ventilation. Pulmonary consultation is appreciated. Patient is being continued on antibody therapy at present, following sputum cultures and suctioned out bronchial tree as possible.   All the records are reviewed and case discussed with Care Management/Social Workerr. Management plans discussed with the patient, family and they are in agreement.  CODE STATUS: FULL CODE  Dicussed with daughter in law today. Poor prognosis. Son will come this afternoon.  DVT Prophylaxis: Heparin discontinued due to INR/platelets. Protonix  TOTAL CRITICAL CARE TIME TAKING CARE OF THIS PATIENT: 45 minutes.    [name] M.D on 10/05/2014 at 3:22 PM  Between 7am to 6pm - Pager - [phone]  After 6pm go to [email or web address] - password [email or web address] [facility]  Office  [phone]  CC: Primary care physician; [name], MD     Significant bilirubinemia with mostly direct. Will get Ct abdomen for any abdominal source of infection and also consult GI.

## 2014-10-05 NOTE — Progress Notes (Signed)
Date of Admission:  09/07/2014     ID: [name] is a 64 y.o. female with  [name] and anasarca admitted with sepsis from cellulitis on thigh.  [place] with strep species  Active Problems:   Amyloidosis   Septic shock   Leaking central line catheter   Subjective: Remains intubated, on pressors - 2 and lowering dose. More alert.    Medications:  . sodium chloride   Intravenous Once  . sodium chloride   Intravenous Once  . albumin human  25 g Intravenous Q6H  . antiseptic oral rinse  7 mL Mouth Rinse QID  . cefTRIAXone (ROCEPHIN)  IV  2 g Intravenous Q24H  . chlorhexidine  15 mL Mouth Rinse BID  . clindamycin (CLEOCIN) IV  600 mg Intravenous 3 times per day  . hydrocortisone sod succinate (SOLU-CORTEF) inj  50 mg Intravenous Q12H  . insulin aspart  0-9 Units Subcutaneous Q4H  . lactulose  20 g Oral BID  . midodrine  10 mg Oral TID  . pantoprazole (PROTONIX) IV  80 mg Intravenous Q12H  . sodium chloride  10-40 mL Intracatheter Q12H    Objective: Vital signs in last 24 hours: Temp:  [97.5 F (36.4 C)-98.8 F (37.1 C)] 97.5 F (36.4 C) (06/01 1300) Pulse Rate:  [92-98] 93 (06/01 1300) Resp:  [12-34] 22 (06/01 1300) BP: (84-101)/(53-76) 101/62 mmHg (06/01 1300) SpO2:  [85 %-100 %] 94 % (06/01 1300) FiO2 (%):  [30 %-40 %] 30 % (06/01 0830) Weight:  [95.391 kg (210 lb 4.8 oz)] 95.391 kg (210 lb 4.8 oz) (06/01 0500)  GENERAL: intubated, critically ill appearing EYES: Pupils equal, round, reactive to light and accommodation. No scleral icterus. Extraocular muscles intact. HEENT: Head atraumatic, normocephalic. Oropharynx and nasopharynx clear. NECK: Supple, no jugular venous distention. No thyroid enlargement, no tenderness. Right IJ TL. LUNGS: Normal breath sounds bilaterally, no wheezing, rales, rhonchi. No use of accessory muscles of respiration. CARDIOVASCULAR: tachy  S1, S2 normal. 2/6 sm ABDOMEN: Soft, nontender, nondistended. Bowel sounds present. No organomegaly  or mass. EXTREMITIES: No cyanosis, clubbing. Anasarca. Superficial ulcer right lateral thigh.  NEUROLOGIC: sedated PSYCHIATRIC: The patient is sedated SKIN: Erythema with warmth right abdominal wall, groin,right lateral thigh. Superficial ulcer right thigh. Decreased erythema  Lab Results  Recent Labs  10/04/14 1148  10/04/14 2310 10/05/14 0614  WBC 16.7*  --   --  20.3*  HGB 7.3*  --   --  6.3*  HCT 21.4*  --   --  18.5*  NA 138  < > 137 139  K 4.1  < > 4.1 4.0  CL 103  < > 103 106  CO2 27  < > 27 28  BUN 18  < > 16 17  CREATININE 0.33*  < > 0.39* <0.30*  < > = values in this interval not displayed. Liver Panel  Recent Labs  10/04/14 1148  10/04/14 2310 10/05/14 0614  PROT 6.3*  --   --  7.1  ALBUMIN 3.7  < > 3.8 4.1  4.0  AST 77*  --   --  60*  ALT 29  --   --  24  ALKPHOS 274*  --   --  266*  BILITOT 20.9*  --   --  20.6*  BILIDIR  --   --   --  15.5*  IBILI  --   --   --  5.1*  < > = values in this interval not displayed.  Microbiology: Results for orders  placed or performed during the hospital encounter of 09/08/2014  Blood culture (routine x 2)     Status: None   Collection Time: 09/19/2014 12:45 AM  Result Value Ref Range Status   Specimen Description BLOOD  Final   Special Requests NONE  Final   Culture  Setup Time   Final    GRAM POSITIVE COCCI IN CHAINS IN BOTH AEROBIC AND ANAEROBIC BOTTLES CRITICAL RESULT CALLED TO, READ BACK BY AND VERIFIED WITH: PAM CRAWFORD AT 1406 09/20/2014 DV    Culture STREPTOCOCCUS SPECIES  Final   Report Status 10/01/2014 FINAL  Final   Organism ID, Bacteria STREPTOCOCCUS SPECIES  Final      Susceptibility   Streptococcus species - MIC (ETEST)*    ERYTHROMYCIN Value in next row Resistant      RESISTANT6    PENICILLIN Value in next row Intermediate      INTERMEDIATE0.25    CEFTRIAXONE Value in next row Sensitive      SENSITIVE0.047    VANCOMYCIN Value in next row Sensitive      SENSITIVE1.0    LEVOFLOXACIN Value in next  row Sensitive      SENSITIVE1.0    * STREPTOCOCCUS SPECIES  Blood culture (routine x 2)     Status: None   Collection Time: 09/07/2014 12:45 AM  Result Value Ref Range Status   Specimen Description BLOOD  Final   Special Requests NONE  Final   Culture  Setup Time   Final    GRAM POSITIVE COCCI IN CHAINS IN BOTH AEROBIC AND ANAEROBIC BOTTLES CRITICAL RESULT CALLED TO, READ BACK BY AND VERIFIED WITH: PAM CRAWFORD AT 1406 09/23/2014 DV    Culture   Final    STREPTOCOCCUS SPECIES REFER TO ACCESSION [id number] COLLECTED ON 09/14/2014 AT 0045 FOR SENSITIVITIES    Report Status 09/30/2014 FINAL  Final  Culture, respiratory (NON-Expectorated)     Status: None (Preliminary result)   Collection Time: 10/04/14  2:00 PM  Result Value Ref Range Status   Specimen Description INDUCED SPUTUM  Final   Special Requests Normal  Final   Gram Stain PENDING  Incomplete   Culture   Final    MODERATE GROWTH YEAST IDENTIFICATION TO FOLLOW ONCE BETTER GROWTH    Report Status PENDING  Incomplete    Studies/Results: Dg Chest Port 1 View  10/03/2014   CLINICAL DATA:  Orogastric and endotracheal tube placement.  EXAM: PORTABLE CHEST - 1 VIEW  COMPARISON:  10/03/2014 at 12:51 p.m.  FINDINGS: Orogastric tube passes below the diaphragm to curl within a mostly decompressed stomach.  Endotracheal tube tip projects the right mainstem bronchus.  Patchy airspace consolidation is noted in the peripheral right upper mid and lower lung and inferior to the right hilum.  Hazy left sided opacity noted previously has mostly resolved. There is mild residual opacity at the left lung base likely atelectasis. There is no pulmonary edema.  No pneumothorax.  IMPRESSION: 1. Endotracheal tube is now positioned. Tip is in the right mainstem bronchus. It will need to be retracted 3 cm for optimal positioning. Critical Value/emergent results were called by telephone at the time of interpretation on 10/03/2014 at 5:25 pm to the nurse in charge of this  patient, who verbally acknowledged these results and will relay these results to respiratory. 2. Persistent right lung consolidation. Improved left lung aeration. 3. Orogastric tube well positioned curling within a mostly decompressed stomach.   Electronically Signed   By: Lajean Manes M.D.   On: 10/03/2014  17:26   Dg Abd Portable 1v  10/03/2014   CLINICAL DATA:  OG tube placement  EXAM: PORTABLE ABDOMEN - 1 VIEW  COMPARISON:  CT abdomen pelvis dated 09/23/2014  FINDINGS: Enteric tube terminates in the gastric cardia.  Nonobstructive bowel gas pattern.  IMPRESSION: Enteric tube terminates in the gastric cardia.   Electronically Signed   By: Julian Hy M.D.   On: 10/03/2014 17:22   Echo 09/29/14 - Procedure narrative: Transthoracic echocardiography. Image quality was poor. No apical views. - Left ventricle: The cavity size was normal. There was moderate concentric hypertrophy. Systolic function was normal. The estimated ejection fraction was in the range of 60% to 65%. Wall motion was normal; there were no regional wall motion abnormalities. The study is not technically sufficient to allow evaluation of LV diastolic function. Impressions: - There was no evidence of a vegetation.  CT abd 5/28  IMPRESSION: 1. Bilateral pleural effusions, ascites and anasarca is identified consistent with fluid overload state. 2. Subcutaneous fat stranding and skin thickening may be related to anasarca or cellulitis. 3. No fluid collections identified. 4. Left lung opacities may reflect pneumonia and/r or areas of asymmetric edema.  Assessment/Plan: [name] is a 64 y.o. 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  Now also with multiorgan failure, elevated LFTS, possible aspiration pna.  Decreasing hemoglobin TTE negative.  CT abd pelvis did not show any significant biliary pathology but possible sludge. Fevers resolved and wbc  down   Recommendations Change ceftriaxone to levofloxacin as ceftriaxone may be contributing to the elevated LFTs (doubt the main cause though)  - this will  cover the streptococcal bacteremia Cont clinda for toxin production and possible aspiration FU BCX to document clearance - neg from 5/31 Sputum cx with yeast Consider TEE to evaluate for endocarditis given that had likelyh viridans strep bacteremia- if 5/31 cx turns positive will definitely need TEE but given elevated coags, low plts hesitant to do at this point  [facility], [place]   10/05/2014, 1:57 PM

## 2014-10-05 NOTE — Progress Notes (Signed)
Awake with eyes open, calm unless being repositioned and with mouth care patient becomes anxious and is purposeful. NSR. o2 sats low 90's on 40% fio2. 300cc output from OG tube this shift. CRRT running well. Blood pressure maintained with vaso and levo drips. Afebrile. Son visited this evening.  Lidwina Kaner B

## 2014-10-05 NOTE — Progress Notes (Signed)
Nutrition Follow-up  DOCUMENTATION CODES:     INTERVENTION:   (Parenteral Nutrition): Spoke with Dr Mortimer Fries and Dr Holley Raring about increasing TPN to goal rate of 60ml/hr of 5% AA/15% Dextrose today and agreeable.  Will provide 84 g of protein (88% protein needs) and 1192 kcals/d (100% kcals needs) Pharmacy aware of rate increase of TPN  NUTRITION DIAGNOSIS:  Inadequate oral intake related to inability to eat as evidenced by NPO status.    GOAL:   (Goal would be for pt to tolerate TPN at goal rate within next 24 hr)    MONITOR:   (PN, Electrolyte and Renal Profile, Digestive System, Glucose Profile, Anthropometrics, UOP )  REASON FOR ASSESSMENT:  Consult New TPN/TNA  ASSESSMENT:  Pt remains on vent, CRRT continues, GI consulted.  Electrolyte and Renal Profile:  Recent Labs Lab 10/03/14 0558  10/04/14 0818  10/04/14 1705 10/04/14 2310 10/05/14 0614  BUN 23*  < >  --   < > 18 16 17   CREATININE 0.75  < >  --   < > 0.32* 0.39* <0.30*  NA 137  < >  --   < > 137 137 139  K 3.6  < >  --   < > 4.0 4.1 4.0  MG 1.5*  --  1.8  --   --   --  1.6*  PHOS  --   --  ICTERUS AT THIS LEVEL MAY AFFECT RESULT  --  SEE COMMENTS SEE COMMENTS SEE COMMENTS  < > = values in this interval not displayed.  Nutritional Anemia Profile:  CBC Latest Ref Rng 10/05/2014 10/04/2014 10/03/2014  WBC 3.6 - 11.0 K/uL 20.3(H) 16.7(H) -  Hemoglobin 12.0 - 16.0 g/dL 6.3(L) 7.3(L) 7.9(L)  Hematocrit 35.0 - 47.0 % 18.5(L) 21.4(L) -  Platelets 150 - 440 K/uL 43(L) 44(L) -   Glucose Profile:  Recent Labs  10/05/14 0735 10/05/14 1135 10/05/14 1241  GLUCAP 197* 194* 186*     UOP: 5 ml yesterday  Height:  Ht Readings from Last 1 Encounters:  09/23/2014 5\' 1"  (1.549 m)    Weight:  Wt Readings from Last 1 Encounters:  10/05/14 210 lb 4.8 oz (95.391 kg)     Wt Readings from Last 10 Encounters:  10/05/14 210 lb 4.8 oz (95.391 kg)  09/20/14 216 lb 4 oz (98.09 kg)  09/13/14 210 lb 15.7 oz (95.7  kg)  07/06/14 216 lb 12.8 oz (98.34 kg)  06/24/14 216 lb 8 oz (98.204 kg)  06/07/14 209 lb 12.8 oz (95.165 kg)  03/16/14 194 lb 8 oz (88.225 kg)  01/14/14 197 lb 12 oz (89.699 kg)  01/11/14 193 lb 12 oz (87.884 kg)  09/28/13 203 lb (92.08 kg)    BMI:  Body mass index is 39.76 kg/(m^2).  Estimated Nutritional Needs:  Kcal:  (11-14 kcals/d) Using actual wt of 95kg) 1050-1330 kcals/d  Protein:  (2.0-2.5 g/d) Using IbW of 48kg 96-120 g/d  Fluid:  (25-74ml/kg) Using IBW of 48kg 1200-1460ml/d  Skin:  Reviewed, no issues  Diet Order:  Diet NPO time specified .TPN (CLINIMIX-E) Adult .TPN (CLINIMIX-E) Adult  EDUCATION NEEDS:  No education needs identified at this time   Intake/Output Summary (Last 24 hours) at 10/05/14 1255 Last data filed at 10/05/14 1200  Gross per 24 hour  Intake 3470.68 ml  Output   4056 ml  Net -585.32 ml    Last BM:  5/30  HIGH Care Level [name], [place], [place] (pager)

## 2014-10-05 NOTE — Progress Notes (Addendum)
Inpatient Diabetes Program Recommendations  AACE/ADA: New Consensus Statement on Inpatient Glycemic Control (2013)  Target Ranges:  Prepandial:   less than 140 mg/dL      Peak postprandial:   less than 180 mg/dL (1-2 hours)      Critically ill patients:  140 - 180 mg/dL   Reason for Assessment:  Results for ALBERTHA, BEATTIE (MRN [id number]) as of 10/05/2014 10:47  Ref. Range 10/04/2014 20:07 10/04/2014 22:12 10/05/2014 02:14 10/05/2014 06:37 10/05/2014 07:35  Glucose-Capillary Latest Ref Range: 65-99 mg/dL 221 (H) 209 (H) 207 (H) 187 (H) 197 (H)    Consider ICU Glycemic control Order set, "Phase 1" which includes q 4 hour coverage.  If Blood sugars, greater than 201 mg/dL, then consider IV insulin/Phase 2.   Thanks,  Adah Perl, RN, BC-ADM Inpatient Diabetes Coordinator Pager [phone] (8a-5p)

## 2014-10-05 NOTE — Consult Note (Signed)
PULMONARY / CRITICAL CARE MEDICINE   Name: Gina Posa MRN: [id number] DOB: [birth year]    ADMISSION DATE:  09/29/2014    CHIEF COMPLAINT:   resp failure     SIGNIFICANT EVENTS    Patient remains intubated,sedated. On full vent support, on multiple vasopressors,  on CRRT, patient with liver,renal and resp failure    PAST MEDICAL HISTORY    :  Past Medical History  Diagnosis Date  . HTN (hypertension)   . Diabetes mellitus type II   . Obesity   . Arthritis   . Chronic kidney disease   . Amyloidosis    Past Surgical History  Procedure Laterality Date  . Total abdominal hysterectomy  10/1999    fibroids  . Combined abdominoplasty and liposuction  07/2000  . Total knee arthroplasty Right 09/28/2013    Procedure: RIGHT TOTAL KNEE ARTHROPLASTY;  Surgeon: Mauri Pole, MD;  Location: [facility];  Service: Orthopedics;  Laterality: Right;   Prior to Admission medications   Medication Sig Start Date End Date Taking? Authorizing Provider  metolazone (ZAROXOLYN) 5 MG tablet Take 5 mg by mouth daily.   Yes Historical Provider, MD  midodrine (PROAMATINE) 10 MG tablet Take 5 mg by mouth 3 (three) times daily.    Yes Historical Provider, MD  polyethylene glycol powder (GLYCOLAX/MIRALAX) powder Take 17 g by mouth daily as needed for moderate constipation. 09/20/14  Yes Abner Greenspan, MD  potassium chloride SA (K-DUR,KLOR-CON) 20 MEQ tablet Take 1 tablet (20 mEq total) by mouth daily. 07/25/14  Yes Abner Greenspan, MD  torsemide (DEMADEX) 20 MG tablet Take 20 mg by mouth 2 (two) times daily as needed (swelling).    Yes Historical Provider, MD   No Known Allergies   FAMILY HISTORY   Family History  Problem Relation Age of Onset  . Arthritis Mother   . Hypertension Mother   . Cancer Mother     breast, lung  . Diabetes Mother   . Hypertension Father       SOCIAL HISTORY    reports that she has never smoked. She has never used smokeless tobacco. She reports that she  does not drink alcohol or use illicit drugs.  Review of Systems  Unable to perform ROS: critical illness      VITAL SIGNS    Temp:  [97.7 F (36.5 C)-98.8 F (37.1 C)] 97.7 F (36.5 C) (06/01 1000) Pulse Rate:  [92-98] 93 (06/01 1000) Resp:  [12-34] 22 (06/01 1000) BP: (81-111)/(53-76) 87/66 mmHg (06/01 1000) SpO2:  [85 %-100 %] 93 % (06/01 1000) FiO2 (%):  [30 %-40 %] 30 % (06/01 0830) Weight:  [210 lb 4.8 oz (95.391 kg)] 210 lb 4.8 oz (95.391 kg) (06/01 0500) HEMODYNAMICS:   VENTILATOR SETTINGS: Vent Mode:  [-] PRVC FiO2 (%):  [30 %-40 %] 30 % Set Rate:  [20 bmp] 20 bmp Vt Set:  [450 mL] 450 mL PEEP:  [5 cmH20] 5 cmH20 INTAKE / OUTPUT:  Intake/Output Summary (Last 24 hours) at 10/05/14 1120 Last data filed at 10/05/14 1000  Gross per 24 hour  Intake 3484.68 ml  Output   4063 ml  Net -578.32 ml       PHYSICAL EXAM   Physical Exam  Constitutional: No distress.  HENT:  Head: Normocephalic and atraumatic.  Eyes: Pupils are equal, round, and reactive to light. No scleral icterus.  Neck: Normal range of motion. Neck supple.  Cardiovascular: Normal rate and regular rhythm.   No murmur heard.  Pulmonary/Chest: No respiratory distress. She has no wheezes. She has rales.  resp distress  Abdominal: Soft. She exhibits no distension. There is no tenderness.  Musculoskeletal: She exhibits no edema.  Neurological: She displays normal reflexes. Coordination normal.  gcs<8T  Skin: Skin is warm. No rash noted. She is not diaphoretic.       LABS   LABS:  CBC  Recent Labs Lab 10/03/14 0558 10/03/14 1736 10/04/14 1148 10/05/14 0614  WBC 17.2*  --  16.7* 20.3*  HGB 7.9* 7.9* 7.3* 6.3*  HCT 23.9*  --  21.4* 18.5*  PLT 47*  --  44* 43*   Coag's  Recent Labs Lab 10/03/14 0558 10/04/14 1148 10/05/14 0614  APTT 50* 48* 50*  INR 2.55 2.69 3.00   BMET  Recent Labs Lab 10/04/14 1705 10/04/14 2310 10/05/14 0614  NA 137 137 139  K 4.0 4.1 4.0  CL  102 103 106  CO2 _0 BUN _1 CREATININE 0.32* 0.39* <0.30*  GLUCOSE 234* 219* 201*   Electrolytes  Recent Labs Lab 10/02/14 1540 10/03/14 0558  10/04/14 0818  10/04/14 1705 10/04/14 2310 10/05/14 0614  CALCIUM 8.3* 8.2*  < >  --   < > 8.7* 9.0 9.0  MG  --  1.5*  --  1.8  --   --   --  1.6*  PHOS 3.1  --   --  ICTERUS AT THIS LEVEL MAY AFFECT RESULT  --  SEE COMMENTS  --   --   < > = values in this interval not displayed. Sepsis Markers No results for input(s): LATICACIDVEN, PROCALCITON, O2SATVEN in the last 168 hours. ABG  Recent Labs Lab 10/03/14 0910 10/03/14 1650 10/04/14 0510  PHART 7.38 7.35 7.41  PCO2ART 45 48 44  PO2ART 49* 289* 59*   Liver Enzymes  Recent Labs Lab 10/03/14 0558  10/04/14 1148 10/04/14 1705 10/04/14 2310 10/05/14 0614  AST 78*  --  77*  --   --  60*  ALT 27  --  29  --   --  24  ALKPHOS 233*  --  274*  --   --  266*  BILITOT 12.1*  --  20.9*  --   --  20.6*  ALBUMIN 3.1*  < > 3.7 3.4* 3.8 4.0  4.1  < > = values in this interval not displayed. Cardiac Enzymes No results for input(s): TROPONINI, PROBNP in the last 168 hours. Glucose  Recent Labs Lab 10/04/14 1813 10/04/14 2007 10/04/14 2212 10/05/14 0214 10/05/14 0637 10/05/14 0735  GLUCAP 214* 221* 209* 207* 187* 197*     Recent Results (from the past 240 hour(s))  Blood culture (routine x 2)     Status: None   Collection Time: 09/26/2014 12:45 AM  Result Value Ref Range Status   Specimen Description BLOOD  Final   Special Requests NONE  Final   Culture  Setup Time   Final    GRAM POSITIVE COCCI IN CHAINS IN BOTH AEROBIC AND ANAEROBIC BOTTLES CRITICAL RESULT CALLED TO, READ BACK BY AND VERIFIED WITH: PAM CRAWFORD AT 1406 09/23/2014 DV    Culture STREPTOCOCCUS SPECIES  Final   Report Status 10/01/2014 FINAL  Final   Organism ID, Bacteria STREPTOCOCCUS SPECIES  Final      Susceptibility   Streptococcus species - MIC (ETEST)*    ERYTHROMYCIN Value in next row  Resistant      RESISTANT6    PENICILLIN Value in next row Intermediate  INTERMEDIATE0.25    CEFTRIAXONE Value in next row Sensitive      SENSITIVE0.047    VANCOMYCIN Value in next row Sensitive      SENSITIVE1.0    LEVOFLOXACIN Value in next row Sensitive      SENSITIVE1.0    * STREPTOCOCCUS SPECIES  Blood culture (routine x 2)     Status: None   Collection Time: 09/14/2014 12:45 AM  Result Value Ref Range Status   Specimen Description BLOOD  Final   Special Requests NONE  Final   Culture  Setup Time   Final    GRAM POSITIVE COCCI IN CHAINS IN BOTH AEROBIC AND ANAEROBIC BOTTLES CRITICAL RESULT CALLED TO, READ BACK BY AND VERIFIED WITH: PAM CRAWFORD AT 1406 09/12/2014 DV    Culture   Final    STREPTOCOCCUS SPECIES REFER TO ACCESSION [id number] COLLECTED ON 09/07/2014 AT 0045 FOR SENSITIVITIES    Report Status 09/30/2014 FINAL  Final  Culture, respiratory (NON-Expectorated)     Status: None (Preliminary result)   Collection Time: 10/04/14  2:00 PM  Result Value Ref Range Status   Specimen Description INDUCED SPUTUM  Final   Special Requests Normal  Final   Gram Stain PENDING  Incomplete   Culture   Final    MODERATE GROWTH YEAST IDENTIFICATION TO FOLLOW ONCE BETTER GROWTH    Report Status PENDING  Incomplete     Current facility-administered medications:  .  Marland KitchenTPN (CLINIMIX-E) Adult, , Intravenous, Continuous TPN, Hillary Bow, MD, Last Rate: 40 mL/hr at 10/04/14 1904 .  acetaminophen (TYLENOL) tablet 650 mg, 650 mg, Oral, Q6H PRN **OR** acetaminophen (TYLENOL) suppository 650 mg, 650 mg, Rectal, Q6H PRN, Lytle Butte, MD, 650 mg at 10/03/14 1030 .  albumin human 25 % solution 25 g, 25 g, Intravenous, Q6H, Munsoor Lateef, MD, 25 g at 10/05/14 0915 .  albuterol (PROVENTIL) (2.5 MG/3ML) 0.083% nebulizer solution 2.5 mg, 2.5 mg, Nebulization, Q6H PRN, Juluis Mire, MD, 2.5 mg at 10/03/14 0816 .  antiseptic oral rinse (CPC / CETYLPYRIDINIUM CHLORIDE 0.05%) solution 7 mL, 7 mL,  Mouth Rinse, QID, Erby Pian, MD, 7 mL at 10/05/14 0408 .  cefTRIAXone (ROCEPHIN) 2 g in dextrose 5 % 50 mL IVPB - Premix, 2 g, Intravenous, Q24H, Srikar Sudini, MD, 2 g at 10/04/14 1809 .  chlorhexidine (PERIDEX) 0.12 % solution 15 mL, 15 mL, Mouth Rinse, BID, Erby Pian, MD, 15 mL at 10/05/14 0803 .  clindamycin (CLEOCIN) IVPB 600 mg, 600 mg, Intravenous, 3 times per day, Adrian Prows, MD, 600 mg at 10/05/14 0610 .  fentaNYL 2523mg in NS 2515m(1071mml) infusion-PREMIX, 10 mcg/hr, Intravenous, Continuous, Srikar Sudini, MD, Last Rate: 15 mL/hr at 10/05/14 0358, 150 mcg/hr at 10/05/14 0358 .  heparin injection 1,000-6,000 Units, 1,000-6,000 Units, CRRT, PRN, HarMurlean IbaD, 1,000 Units at 10/01/14 1435 .  hydrocortisone sodium succinate (SOLU-CORTEF) 100 MG injection 50 mg, 50 mg, Intravenous, Q12H, SriHillary BowD, 50 mg at 10/05/14 0358 .  insulin aspart (novoLOG) injection 0-9 Units, 0-9 Units, Subcutaneous, Q4H, KurFlora LippsD, 2 Units at 10/05/14 0640 .  lactulose (CHRONULAC) 10 GM/15ML solution 20 g, 20 g, Oral, BID, MatJosefine ClassD, 20 g at 10/05/14 [id number] midodrine (PROAMATINE) tablet 10 mg, 10 mg, Oral, TID, DavLytle ButteD, 10 mg at 10/05/14 [id number] morphine 2 MG/ML injection 2 mg, 2 mg, Intravenous, Q4H PRN, DavLytle ButteD, 2 mg at 10/05/14 0013 .  norepinephrine (LEVOPHED) 16 mg  in dextrose 5 % 250 mL (0.064 mg/mL) infusion, 0-40 mcg/min, Intravenous, Titrated, Srikar Sudini, MD, Last Rate: 15 mL/hr at 10/05/14 0534, 16 mcg/min at 10/05/14 0534 .  ondansetron (ZOFRAN) tablet 4 mg, 4 mg, Oral, Q6H PRN **OR** ondansetron (ZOFRAN) injection 4 mg, 4 mg, Intravenous, Q6H PRN, Lytle Butte, MD, 4 mg at 10/02/14 1804 .  pantoprazole (PROTONIX) injection 80 mg, 80 mg, Intravenous, Q12H, Vira Blanco, RPH, 80 mg at 10/05/14 8088 .  polyethylene glycol powder (GLYCOLAX/MIRALAX) container 17 g, 17 g, Oral, Daily PRN, Lytle Butte, MD .  pureflow IV solution  for Dialysis, , CRRT, Continuous, Munsoor Lateef, MD, Last Rate: 2,000 mL/hr at 10/05/14 0708 .  sodium chloride 0.9 % injection 10-40 mL, 10-40 mL, Intracatheter, Q12H, Srikar Sudini, MD, 20 mL at 10/04/14 2315 .  sodium chloride 0.9 % injection 10-40 mL, 10-40 mL, Intracatheter, PRN, Hillary Bow, MD .  vasopressin (PITRESSIN) 40 Units in sodium chloride 0.9 % 250 mL (0.16 Units/mL) infusion, 0.03 Units/min, Intravenous, Continuous, Lytle Butte, MD, Last Rate: 11.3 mL/hr at 10/04/14 1104, 0.03 Units/min at 10/04/14 1104  IMAGING    No results found.    Indwelling Urinary Catheter continued, requirement due to   Reason to continue Indwelling Urinary Catheter for strict Intake/Output monitoring for hemodynamic instability   Central Line continued, requirement due to   Reason to continue [employer] Monitoring of central venous pressure or other hemodynamic parameters   Ventilator continued, requirement due to, resp failure    Ventilator Sedation RASS 0 to -2      ASSESSMENT/PLAN   64 yo AAF admitted to ICU for acute septic shock and progressive resp failure with renal failure Patient with underlying Amyloidosis with progressive multiorgan failure   PULMONARY-Respiratory Failure -continue Full MV support -continue Bronchodilator Therapy -Wean Fio2 and PEEP as tolerated -will perform SAT/SBt when respiratory parameters are met   CARDIOVASCULAR -septic shock-use vasopressors to keep MAP>65 -ABX as prescribed -repeat Cultures   RENAL -progressive renal failure from ATN -on CRRT -follow up Nephrology recs  GASTROINTESTINAL -increased OG output -bowel rest for now -liver failure -follow up Gi recs   HEMATOLOGIC -follow H/h  INFECTIOUS -streptococcus bacteremia from cellulitis -on abx -repeat blood cultures   ENDOCRINE -follow FSBS  NEUROLOGIC -RASS goal 0 to -2    I have personally obtained a history, examined the patient, evaluated laboratory  and imaging results, formulated the assessment and plan and placed orders.  The Patient requires high complexity decision making for assessment and support, frequent evaluation and titration of therapies, application of advanced monitoring technologies and extensive interpretation of multiple databases. Critical Care Time devoted to patient care services described in this note is 45 minutes.   Overall, patient is critically ill, prognosis is very poor/guarded. Patient at high risk for cardiac arrest and death.   Palliative care team consulted-recommend DNR status, recommend comfort care measures  Corrin Parker, M.D. [facility] [place] Director Intensive Care Unit   10/05/2014, 11:20 AM

## 2014-10-05 NOTE — Progress Notes (Signed)
GI Inpatient Follow-up Note  Patient Identification: [name] is a 64 y.o. female with septic shock, multi - organ failure  Subjective:  Still intubated and sedated.  Some dark outpt from OG tube.  Hgb still dropping. Receiving FFP and PRBC.  Son at bedside.   Scheduled Inpatient Medications:  . antiseptic oral rinse  7 mL Mouth Rinse QID  . chlorhexidine  15 mL Mouth Rinse BID  . clindamycin (CLEOCIN) IV  600 mg Intravenous 3 times per day  . hydrocortisone sod succinate (SOLU-CORTEF) inj  50 mg Intravenous Q12H  . insulin aspart  0-9 Units Subcutaneous Q4H  . lactulose  20 g Oral BID  . levofloxacin (LEVAQUIN) IV  750 mg Intravenous Q24H  . midodrine  10 mg Oral TID  . pantoprazole (PROTONIX) IV  80 mg Intravenous Q12H  . sodium chloride  10-40 mL Intracatheter Q12H    Continuous Inpatient Infusions:   . Marland KitchenTPN (CLINIMIX-E) Adult 70 mL/hr at 10/05/14 2012  . fentaNYL infusion INTRAVENOUS 300 mcg/hr (10/05/14 1900)  . norepinephrine (LEVOPHED) Adult infusion 13 mcg/min (10/05/14 1900)  . pureflow 2,000 mL/hr at 10/05/14 1907  . vasopressin (PITRESSIN) infusion - *FOR SHOCK* 0.03 Units/min (10/05/14 1900)    PRN Inpatient Medications:  acetaminophen **OR** acetaminophen, albuterol, heparin, morphine injection, ondansetron **OR** ondansetron (ZOFRAN) IV, polyethylene glycol powder, sodium chloride  Review of Systems: unable    Physical Examination: BP 100/66 mmHg  Pulse 93  Temp(Src) 96.6 F (35.9 C) (Rectal)  Resp 15  Ht 5\' 1"  (1.549 m)  Wt 210 lb 4.8 oz (95.391 kg)  BMI 39.76 kg/m2  SpO2 98% Gen: intubated, opens eyes, sedate.  Neck: supple, no JVD or thyromegaly Chest: coarse bilat, decreased. CV: tachy, regular, no r/g Abd: +distended, no r/g, decreased bowel sounds.  Ext: n2+ edema  Data: Lab Results  Component Value Date   WBC 20.3* 10/05/2014   HGB 6.3* 10/05/2014   HCT 18.5* 10/05/2014   MCV 91.5 10/05/2014   PLT 43* 10/05/2014     Recent Labs Lab 10/03/14 1736 10/04/14 1148 10/05/14 0614  HGB 7.9* 7.3* 6.3*   Lab Results  Component Value Date   NA 139 10/05/2014   K 4.0 10/05/2014   CL 105 10/05/2014   CO2 28 10/05/2014   BUN 18 10/05/2014   CREATININE  10/05/2014    UNABLE TO REPORT DUE TO ICTERUS/SERUM COLOR INTERFERENCE   Lab Results  Component Value Date   ALT 24 10/05/2014   AST 60* 10/05/2014   ALKPHOS 266* 10/05/2014   BILITOT 20.6* 10/05/2014    Recent Labs Lab 10/05/14 0614  APTT 50*  INR 3.00   Assessment/Plan: [name] is a 64 y.o. female with septic shock and multi-organ failure.  She is dropping Hgb and has dark output from OG tube.  Likely oozing gastropathy in setting of INR 3 and platelets 40.   Extremely poor prognosis, multi-organ failure including liver.    Endoscopy to stop bleeding would likely be futile with INR 3 and platelets 40.    Recommendations: - agree with pallliative care team input, appropriate for comfort care once family agrees.   - possible EGD tomorrow but likely will be futile - nothing else to offer in terms of liver failure, not a transplant candidate, likely secondary to septic shock, MOF.    Please call with questions or concerns.  Samyia Motter, Grace Blight, MD

## 2014-10-05 DEATH — deceased

## 2014-10-06 ENCOUNTER — Encounter: Admission: EM | Disposition: E | Payer: Self-pay | Source: Home / Self Care | Attending: Internal Medicine

## 2014-10-06 ENCOUNTER — Encounter: Payer: Self-pay | Admitting: Anesthesiology

## 2014-10-06 DIAGNOSIS — J9601 Acute respiratory failure with hypoxia: Secondary | ICD-10-CM

## 2014-10-06 LAB — RENAL FUNCTION PANEL
ALBUMIN: 3.9 g/dL (ref 3.5–5.0)
ALBUMIN: 3.6 g/dL (ref 3.5–5.0)
Albumin: 3.1 g/dL — ABNORMAL LOW (ref 3.5–5.0)
Albumin: 3.4 g/dL — ABNORMAL LOW (ref 3.5–5.0)
Anion gap: 5 (ref 5–15)
Anion gap: 10 (ref 5–15)
Anion gap: 5 (ref 5–15)
Anion gap: 5 (ref 5–15)
BUN: 21 mg/dL — AB (ref 6–20)
BUN: 21 mg/dL — AB (ref 6–20)
BUN: 19 mg/dL (ref 6–20)
BUN: 24 mg/dL — AB (ref 6–20)
CALCIUM: 7.6 mg/dL — AB (ref 8.9–10.3)
CHLORIDE: 104 mmol/L (ref 101–111)
CO2: 25 mmol/L (ref 22–32)
CO2: 26 mmol/L (ref 22–32)
CO2: 28 mmol/L (ref 22–32)
CO2: 28 mmol/L (ref 22–32)
CREATININE: 0.51 mg/dL (ref 0.44–1.00)
CREATININE: UNDETERMINED mg/dL (ref 0.44–1.00)
Calcium: 8.6 mg/dL — ABNORMAL LOW (ref 8.9–10.3)
Calcium: 9 mg/dL (ref 8.9–10.3)
Calcium: 8.9 mg/dL (ref 8.9–10.3)
Chloride: 97 mmol/L — ABNORMAL LOW (ref 101–111)
Chloride: 101 mmol/L (ref 101–111)
Chloride: 105 mmol/L (ref 101–111)
Creatinine, Ser: 0.43 mg/dL — ABNORMAL LOW (ref 0.44–1.00)
GFR calc non Af Amer: >60 mL/min (ref >60)
Glucose, Bld: 163 mg/dL — ABNORMAL HIGH (ref 65–99)
Glucose, Bld: 210 mg/dL — ABNORMAL HIGH (ref 65–99)
Glucose, Bld: 236 mg/dL — ABNORMAL HIGH (ref 65–99)
Glucose, Bld: 267 mg/dL — ABNORMAL HIGH (ref 65–99)
POTASSIUM: 3.8 mmol/L (ref 3.5–5.1)
POTASSIUM: 3.9 mmol/L (ref 3.5–5.1)
Potassium: 2.9 mmol/L — CL (ref 3.5–5.1)
Potassium: 3.8 mmol/L (ref 3.5–5.1)
SODIUM: 127 mmol/L — AB (ref 135–145)
SODIUM: 137 mmol/L (ref 135–145)
Sodium: 137 mmol/L (ref 135–145)
Sodium: 138 mmol/L (ref 135–145)

## 2014-10-06 LAB — GLUCOSE, CAPILLARY
GLUCOSE-CAPILLARY: 247 mg/dL — AB (ref 65–99)
Glucose-Capillary: 221 mg/dL — ABNORMAL HIGH (ref 65–99)
Glucose-Capillary: 225 mg/dL — ABNORMAL HIGH (ref 65–99)
Glucose-Capillary: 254 mg/dL — ABNORMAL HIGH (ref 65–99)
Glucose-Capillary: 259 mg/dL — ABNORMAL HIGH (ref 65–99)
Glucose-Capillary: 249 mg/dL — ABNORMAL HIGH (ref 65–99)

## 2014-10-06 LAB — COMPREHENSIVE METABOLIC PANEL
ALT: 28 U/L (ref 14–54)
AST: 55 U/L — AB (ref 15–41)
Albumin: 4 g/dL (ref 3.5–5.0)
Alkaline Phosphatase: 351 U/L — ABNORMAL HIGH (ref 38–126)
Anion gap: 7 (ref 5–15)
BUN: 18 mg/dL (ref 6–20)
CO2: 29 mmol/L (ref 22–32)
Calcium: 9.2 mg/dL (ref 8.9–10.3)
Chloride: 103 mmol/L (ref 101–111)
Creatinine, Ser: UNDETERMINED mg/dL (ref 0.44–1.00)
Glucose, Bld: 238 mg/dL — ABNORMAL HIGH (ref 65–99)
Potassium: 4.2 mmol/L (ref 3.5–5.1)
SODIUM: 139 mmol/L (ref 135–145)
TOTAL PROTEIN: 7.6 g/dL (ref 6.5–8.1)
Total Bilirubin: 23.9 mg/dL (ref 0.3–1.2)

## 2014-10-06 LAB — PREPARE FRESH FROZEN PLASMA
UNIT DIVISION: 0
UNIT DIVISION: 0
Unit division: 0
Unit division: 0

## 2014-10-06 LAB — CBC
HCT: 20.7 % — ABNORMAL LOW (ref 35.0–47.0)
Hemoglobin: 7.1 g/dL — ABNORMAL LOW (ref 12.0–16.0)
MCH: 29.7 pg (ref 26.0–34.0)
MCHC: 34.1 g/dL (ref 32.0–36.0)
MCV: 87.2 fL (ref 80.0–100.0)
Platelets: 55 10*3/uL — ABNORMAL LOW (ref 150–440)
RBC: 2.37 MIL/uL — AB (ref 3.80–5.20)
RDW: 16.7 % — ABNORMAL HIGH (ref 11.5–14.5)
WBC: 21.2 10*3/uL — AB (ref 3.6–11.0)

## 2014-10-06 LAB — FIBRINOGEN: FIBRINOGEN: 338 mg/dL (ref 210–470)

## 2014-10-06 LAB — APTT: aPTT: 42 seconds — ABNORMAL HIGH (ref 24–36)

## 2014-10-06 LAB — MAGNESIUM: Magnesium: 1.8 mg/dL (ref 1.7–2.4)

## 2014-10-06 LAB — FIBRIN DERIVATIVES D-DIMER (ARMC ONLY): FIBRIN DERIVATIVES D-DIMER (ARMC): 7337 — AB (ref 0–499)

## 2014-10-06 LAB — PROTIME-INR
INR: 2.25
Prothrombin Time: 25 seconds — ABNORMAL HIGH (ref 11.4–15.0)

## 2014-10-06 LAB — PHOSPHORUS

## 2014-10-06 SURGERY — EGD (ESOPHAGOGASTRODUODENOSCOPY)
Anesthesia: Monitor Anesthesia Care

## 2014-10-06 MED ORDER — LEVOFLOXACIN IN D5W 500 MG/100ML IV SOLN
500.0000 mg | INTRAVENOUS | Status: DC
  Administered 2014-10-06 – 2014-10-17 (×12): 500 mg via INTRAVENOUS
  Filled 2014-10-06 (×13): qty 100

## 2014-10-06 MED ORDER — SODIUM CHLORIDE 0.9 % IV SOLN
INTRAVENOUS | Status: DC
  Administered 2014-10-06: 11:00:00 via INTRAVENOUS

## 2014-10-06 MED ORDER — VECURONIUM BROMIDE 10 MG IV SOLR
10.0000 mg | Freq: Once | INTRAVENOUS | Status: DC
  Filled 2014-10-06: qty 70

## 2014-10-06 MED ORDER — MAGNESIUM SULFATE 2 GM/50ML IV SOLN
2.0000 g | Freq: Once | INTRAVENOUS | Status: AC
  Administered 2014-10-06: 2 g via INTRAVENOUS
  Filled 2014-10-06: qty 50

## 2014-10-06 MED ORDER — MIDAZOLAM HCL 2 MG/2ML IJ SOLN
4.0000 mg | Freq: Once | INTRAMUSCULAR | Status: AC
  Administered 2014-10-06: 2 mg via INTRAVENOUS
  Filled 2014-10-06: qty 4

## 2014-10-06 MED ORDER — SUCRALFATE 1 GM/10ML PO SUSP
1.0000 g | Freq: Four times a day (QID) | ORAL | Status: DC
  Administered 2014-10-06: 1 g via ORAL
  Administered 2014-10-06: 0.1 g via ORAL
  Administered 2014-10-07 – 2014-10-18 (×43): 1 g via ORAL
  Filled 2014-10-06 (×63): qty 10

## 2014-10-06 MED ORDER — M.V.I. ADULT IV INJ
INJECTION | INTRAVENOUS | Status: AC
  Administered 2014-10-06: 19:00:00 via INTRAVENOUS
  Filled 2014-10-06: qty 1680

## 2014-10-06 MED ORDER — SODIUM CHLORIDE 0.9 % IJ SOLN
INTRAMUSCULAR | Status: AC
  Filled 2014-10-06: qty 10

## 2014-10-06 NOTE — Progress Notes (Signed)
Subjective:  Notes reviewed. Remains critically ill. Remains essentially anuric. Still on CRRT and tolerating well.    Objective:  Vital signs in last 24 hours:  Temp:  [96.3 F (35.7 C)-97.7 F (36.5 C)] 97.7 F (36.5 C) (06/02 0700) Pulse Rate:  [84-94] 85 (06/02 0900) Resp:  [11-26] 23 (06/02 0900) BP: (86-123)/(55-85) 95/68 mmHg (06/02 0900) SpO2:  [89 %-99 %] 97 % (06/02 0900) FiO2 (%):  [30 %-40 %] 40 % (06/02 0330) Weight:  [96.5 kg (212 lb 11.9 oz)] 96.5 kg (212 lb 11.9 oz) (06/02 0515)  Weight change: 1.108 kg (2 lb 7.1 oz) Filed Weights   10/04/14 0458 10/05/14 0500 10/13/2014 0515  Weight: 95.3 kg (210 lb 1.6 oz) 95.391 kg (210 lb 4.8 oz) 96.5 kg (212 lb 11.9 oz)    Intake/Output: I/O last 3 completed shifts: In: 5814.8 [I.V.:1718.5; Blood:1295; NG/GT:300; IV [id number]:664] Out: 4034 [Urine:11; Emesis/NG output:750; Other:5813]     Physical Exam: General: Critically ill appearing  HEENT Scleral icterus noted, ETT in place  Neck supple  Pulm/lungs Decreased breath sounds at bases, vent assisted  CVS/Heart S1S2 no rubs  Abdomen:  Soft, non tender, BS present  Extremities: +++ anasarca  Neurologic: On the vent  Skin: Warm/dry  Access: Temp cath- rt femoral       Basic Metabolic Panel:  Recent Labs Lab 10/02/14 0151  10/02/14 1540 10/03/14 0558  10/04/14 0818  10/04/14 1705 10/04/14 2310 10/05/14 7425 10/05/14 1552 10/29/2014 0140 10/05/2014 0538 10/23/2014 0651  NA  --   < > 135 137  < >  --   < > 137 137 139 139 139  --  137  K  --   < > 5.1 3.6  < >  --   < > 4.0 4.1 4.0 4.0 4.2  --  3.9  CL  --   < > 101 104  < >  --   < > 102 103 106 105 103  --  104  CO2  --   < > 26 26  < >  --   < > 28 27 28 28 29   --  28  GLUCOSE  --   < > 181* 163*  < >  --   < > 234* 219* 201* 206* 238*  --  236*  BUN  --   < > 27* 23*  < >  --   < > 18 16 17 18 18   --  19  CREATININE  --   < > 1.19* 0.75  < >  --   < > 0.32* 0.39* <0.30* UNABLE TO REPORT DUE TO  ICTERUS/SERUM COLOR INTERFERENCE UNABLE TO REPORT DUE TO ICTERUS INTERFERENCE  --  unable to report due to icterus  CALCIUM  --   < > 8.3* 8.2*  < >  --   < > 8.7* 9.0 9.0 9.1 9.2  --  9.0  MG 1.7  --   --  1.5*  --  1.8  --   --   --  1.6*  --   --  1.8  --   PHOS  --   < > 3.1  --   --  ICTERUS AT THIS LEVEL MAY AFFECT RESULT  --  SEE COMMENTS SEE COMMENTS SEE COMMENTS  --   --   --   --   < > = values in this interval not displayed.   CBC:  Recent Labs Lab 10/02/14 1030 10/03/14 0558 10/03/14 1736  10/04/14 1148 10/05/14 0614 10/21/2014 0538  WBC 24.6* 17.2*  --  16.7* 20.3* 21.2*  NEUTROABS 21.1*  --   --   --   --   --   HGB 5.9* 7.9* 7.9* 7.3* 6.3* 7.1*  HCT 18.6* 23.9*  --  21.4* 18.5* 20.7*  MCV 88.6 86.7  --  87.8 91.5 87.2  PLT 68* 47*  --  44* 43* 55*      Microbiology: Results for orders placed or performed during the hospital encounter of 09/25/2014  Blood culture (routine x 2)     Status: None   Collection Time: 09/07/2014 12:45 AM  Result Value Ref Range Status   Specimen Description BLOOD  Final   Special Requests NONE  Final   Culture  Setup Time   Final    GRAM POSITIVE COCCI IN CHAINS IN BOTH AEROBIC AND ANAEROBIC BOTTLES CRITICAL RESULT CALLED TO, READ BACK BY AND VERIFIED WITH: PAM CRAWFORD AT 1406 09/25/2014 DV    Culture STREPTOCOCCUS SPECIES  Final   Report Status 10/01/2014 FINAL  Final   Organism ID, Bacteria STREPTOCOCCUS SPECIES  Final      Susceptibility   Streptococcus species - MIC (ETEST)*    ERYTHROMYCIN Value in next row Resistant      RESISTANT6    PENICILLIN Value in next row Intermediate      INTERMEDIATE0.25    CEFTRIAXONE Value in next row Sensitive      SENSITIVE0.047    VANCOMYCIN Value in next row Sensitive      SENSITIVE1.0    LEVOFLOXACIN Value in next row Sensitive      SENSITIVE1.0    * STREPTOCOCCUS SPECIES  Blood culture (routine x 2)     Status: None   Collection Time: 10/01/2014 12:45 AM  Result Value Ref Range Status    Specimen Description BLOOD  Final   Special Requests NONE  Final   Culture  Setup Time   Final    GRAM POSITIVE COCCI IN CHAINS IN BOTH AEROBIC AND ANAEROBIC BOTTLES CRITICAL RESULT CALLED TO, READ BACK BY AND VERIFIED WITH: PAM CRAWFORD AT 1406 09/19/2014 DV    Culture   Final    STREPTOCOCCUS SPECIES REFER TO ACCESSION [id number] COLLECTED ON 10/04/2014 AT 0045 FOR SENSITIVITIES    Report Status 09/30/2014 FINAL  Final  Culture, blood (routine x 2)     Status: None (Preliminary result)   Collection Time: 10/04/14 11:50 AM  Result Value Ref Range Status   Specimen Description BLOOD  Final   Special Requests NONE  Final   Culture NO GROWTH 1 DAY  Final   Report Status PENDING  Incomplete  Culture, blood (routine x 2)     Status: None (Preliminary result)   Collection Time: 10/04/14 12:00 PM  Result Value Ref Range Status   Specimen Description BLOOD  Final   Special Requests NONE  Final   Culture NO GROWTH 1 DAY  Final   Report Status PENDING  Incomplete  Culture, respiratory (NON-Expectorated)     Status: None (Preliminary result)   Collection Time: 10/04/14  2:00 PM  Result Value Ref Range Status   Specimen Description INDUCED SPUTUM  Final   Special Requests Normal  Final   Gram Stain   Final    EXCELLENT SPECIMEN - 90-100% WBCS MANY WBC SEEN MANY YEAST    Culture   Final    MODERATE GROWTH YEAST IDENTIFICATION TO FOLLOW ONCE BETTER GROWTH    Report Status PENDING  Incomplete  Coagulation Studies:  Recent Labs  10/04/14 1148 10/05/14 0614 10/21/2014 0538  LABPROT 28.7* 31.2* 25.0*  INR 2.69 3.00 2.25    Urinalysis: No results for input(s): COLORURINE, LABSPEC, PHURINE, GLUCOSEU, HGBUR, BILIRUBINUR, KETONESUR, PROTEINUR, UROBILINOGEN, NITRITE, LEUKOCYTESUR in the last 72 hours.  Invalid input(s): APPERANCEUR    Imaging: No results found.   Medications:   . Marland KitchenTPN (CLINIMIX-E) Adult 70 mL/hr at 10/05/14 2012  . fentaNYL infusion INTRAVENOUS 300 mcg/hr (10/28/2014  0314)  . norepinephrine (LEVOPHED) Adult infusion 7 mcg/min (10/05/2014 0645)  . pureflow 2,000 mL/hr at 10/19/2014 3212  . vasopressin (PITRESSIN) infusion - *FOR SHOCK* 0.03 Units/min (10/16/2014 0543)   . antiseptic oral rinse  7 mL Mouth Rinse QID  . chlorhexidine  15 mL Mouth Rinse BID  . clindamycin (CLEOCIN) IV  600 mg Intravenous 3 times per day  . hydrocortisone sod succinate (SOLU-CORTEF) inj  50 mg Intravenous Q12H  . insulin aspart  0-9 Units Subcutaneous Q4H  . lactulose  20 g Oral BID  . levofloxacin (LEVAQUIN) IV  750 mg Intravenous Q24H  . midodrine  10 mg Oral TID  . pantoprazole (PROTONIX) IV  80 mg Intravenous Q12H  . sodium chloride  10-40 mL Intracatheter Q12H   acetaminophen **OR** acetaminophen, albuterol, heparin, morphine injection, ondansetron **OR** ondansetron (ZOFRAN) IV, polyethylene glycol powder, sodium chloride  Assessment/ Plan:   63 y.o. African [place] female with diabetes mellitus type 2, hypertension, morbid obesity, osteoarthritis, status post right knee surgery, nephrotic syndrome with edema, hypoalbuminemia, renal insufficiency and proteinuria.  1. ARF with CKD st 3 . Baseline cr 1.3. (08/2014)  2. Nephrotic Syndrome with proteinuria: Renal Amyloidosis.  3. Generalized Edema 4. Hypoalbuminemia 5. Right groin cellulitis /sepsis- strep species  6. Diabetes Mellitus type II 7. Hypotension  Plan: Pt remains critically ill and with multiorgan failure.  She is on mechanical ventilation and CRRT as well as pressor support.  Pt hasn't improved as hoped secondary to amyloidosis.  Not a candidate for aggressive immunotherapy at this time given critically ill state.  Palliative care on board.  For now will continue CRRT until further disposition reached.  Will continue with UF target of 175cc/hr.  Was net negative yesterday when considering  LOS: 8 Shigeru Lampert 6/2/20169:17 AM

## 2014-10-06 NOTE — Consult Note (Signed)
Palliative Medicine Inpatient Consult Follow Up Note   Name: [name] Date: 11/03/2014 MRN: [id number]  DOB: 04/13/1951  Referring Physician: Aldean Jewett, MD  Palliative Care consult requested for this 64 y.o. female for goals of medical therapy in patient with amyloidosis (markers - for multiple myeloma), CKD stage III, T2DM, HTN, h/o uterine fibroids, and s/p total abdominal hysterectomy.   Pt currently resting in bed and non responsive to stimuli. Pt currently on ventilator. Pt receiving TPN, continuous albumin drip and 2 pressors.     REVIEW OF SYSTEMS:  Patient is not able to provide ROS  CODE STATUS: Full code   PAST MEDICAL HISTORY: Past Medical History  Diagnosis Date  . HTN (hypertension)   . Diabetes mellitus type II   . Obesity   . Arthritis   . Chronic kidney disease   . Amyloidosis     PAST SURGICAL HISTORY:  Past Surgical History  Procedure Laterality Date  . Total abdominal hysterectomy  10/1999    fibroids  . Combined abdominoplasty and liposuction  07/2000  . Total knee arthroplasty Right 09/28/2013    Procedure: RIGHT TOTAL KNEE ARTHROPLASTY;  Surgeon: Mauri Pole, MD;  Location: [facility];  Service: Orthopedics;  Laterality: Right;    Vital Signs: BP 92/62 mmHg  Pulse 84  Temp(Src) 97.6 F (36.4 C) (Oral)  Resp 21  Ht $R'5\' 1"'aT$  (1.549 m)  Wt 96.5 kg (212 lb 11.9 oz)  BMI 40.22 kg/m2  SpO2 97% Filed Weights   10/04/14 0458 10/05/14 0500 10/17/2014 0515  Weight: 95.3 kg (210 lb 1.6 oz) 95.391 kg (210 lb 4.8 oz) 96.5 kg (212 lb 11.9 oz)    Estimated body mass index is 40.22 kg/(m^2) as calculated from the following:   Height as of this encounter: $RemoveBeforeD'5\' 1"'mGYaBOnZmHvFXp$  (1.549 m).   Weight as of this encounter: 96.5 kg (212 lb 11.9 oz).  PHYSICAL EXAM: General: Critically ill appearing HEENT: OP clear, ETT present, icteric sclera Neck: Trachea midline  Cardiovascular: regular rate and rhythm Pulmonary/Chest: crackles to bilat ant  fields Abdominal: Soft. Hypoactive bowel sounds GU: Foley present, scant dark urine Extremities: 3 + pitting edema BLE's and BUE's Neurological: Opens eyes to voice, moves extremities Skin: Warm, dry and intact. R flank wound with gauze, CDI Psychiatric: Unable to assess  LABS: CBC:    Component Value Date/Time   WBC 21.2* 10/09/2014 0538   WBC 9.2 08/22/2014 1712   HGB 7.1* 10/29/2014 0538   HGB 12.3 08/22/2014 1712   HCT 20.7* 10/10/2014 0538   HCT 38.3 08/22/2014 1712   PLT 55* 11/01/2014 0538   PLT 503* 08/22/2014 1712   MCV 87.2 10/10/2014 0538   MCV 87 08/22/2014 1712   NEUTROABS 21.1* 10/02/2014 1030   NEUTROABS 4.0 08/22/2014 1712   LYMPHSABS 1.5 10/02/2014 1030   LYMPHSABS 4.2* 08/22/2014 1712   MONOABS 2.0* 10/02/2014 1030   MONOABS 0.9 08/22/2014 1712   EOSABS 0.0 10/02/2014 1030   EOSABS 0.0 08/22/2014 1712   BASOSABS 0.0 10/02/2014 1030   BASOSABS 0.1 08/22/2014 1712   Comprehensive Metabolic Panel:    Component Value Date/Time   NA 137 10/13/2014 0651   NA 136 08/22/2014 1712   K 3.9 10/14/2014 0651   K 2.6* 08/22/2014 1712   CL 104 10/08/2014 0651   CL 92* 08/22/2014 1712   CO2 28 10/20/2014 0651   CO2 35* 08/22/2014 1712   BUN 19 10/11/2014 0651   BUN 29* 08/22/2014 1712   CREATININE unable  to report due to icterus 10/07/2014 0651   CREATININE 1.78* 08/22/2014 1712   GLUCOSE 236* 10/13/2014 0651   GLUCOSE 167* 08/22/2014 1712   CALCIUM 9.0 10/14/2014 0651   CALCIUM 8.2* 08/22/2014 1712   AST 55* 10/05/2014 0140   AST 40 08/22/2014 1712   ALT 28 10/30/2014 0140   ALT 22 08/22/2014 1712   ALKPHOS 351* 10/21/2014 0140   ALKPHOS 294* 08/22/2014 1712   BILITOT 23.9* 11/02/2014 0140   PROT 7.6 11/01/2014 0140   PROT 5.0* 08/22/2014 1712   ALBUMIN 3.9 10/26/2014 0651   ALBUMIN 1.7* 08/22/2014 1712    IMPRESSION:  [name] is a 64 y.o. female with amyloidosis (markers - for multiple myeloma), CKD stage III, T2DM, HTN, h/o uterine fibroids,  and s/p total abdominal hysterectomy. Pt presented to ER from home with AMS. ER workup significant for lactic acidosis, severe hypoalbuminemia, and ARF. Pt admitted to CCU for treatment and evaluation of sepsis. Family at bedside.  Pt continues with leukocytosis and worsening liver function tests.  Pt with continued thrombocytopenia despite platelet transfusion.  Pt on vent at 30% FiO2 and 5 PEEP.  Pt continues to be on 2 pressors and CRRT.  Family not at bedside.  Will attempt to update.  PLAN: 1. Full code     More than 50% of the visit was spent in counseling/coordination of care: YES  Time Spent: 25 minutes

## 2014-10-06 NOTE — Progress Notes (Signed)
[facility] [place] NAME: [name]    MR#:  [id number]  DATE OF BIRTH:  Jul 30, 1950  SUBJECTIVE:  CHIEF COMPLAINT:   Chief Complaint  Patient presents with  . Altered Mental Status    Critically ill. Plan is for EGD today  REVIEW OF SYSTEMS:    ROS  Unable to give history due to encephalopathy  DRUG ALLERGIES:  No Known Allergies  VITALS:  Blood pressure 90/62, pulse 84, temperature 97.6 F (36.4 C), temperature source Oral, resp. rate 22, height 5\' 1"  (1.549 m), weight 96.5 kg (212 lb 11.9 oz), SpO2 92 %.  PHYSICAL EXAMINATION:   Physical Exam  GENERAL:  64 y.o.-year-old patient lying in the bed critically ill EYES: Pupils equal, round, reactive to light and accommodation. + scleral icterus.  HEENT: Head atraumatic, normocephalic.Intubated NECK:  Supple, no jugular venous distention. No thyroid enlargement, no tenderness. Right IJ  LUNGS: coarse breath sounds, poor air movement, vent CARDIOVASCULAR: S1, S2 normal. No murmurs,  + rub, - gallops. ABDOMEN: Soft, nontender, nondistended. Bowel sounds decreased. No organomegaly or mass. EXTREMITIES: No cyanosis, clubbing. Anasarca.  NEUROLOGIC: Drowzy.lethargic. Opens eyes, follows simple commands. PSYCHIATRIC: The patient is drowzy. SKIN:  Erythema with warmth right abdominal wall, groin,right lateral thigh. Superficial ulcer right thigh.  LABORATORY PANEL:   CBC  Recent Labs Lab 10/16/2014 0538  WBC 21.2*  HGB 7.1*  HCT 20.7*  PLT 55*   ------------------------------------------------------------------------------------------------------------------  Chemistries   Recent Labs Lab 10/07/2014 0140 10/07/2014 0538 10/24/2014 0651  NA 139  --  137  K 4.2  --  3.9  CL 103  --  104  CO2 29  --  28  GLUCOSE 238*  --  236*  BUN 18  --  19  CREATININE UNABLE TO REPORT DUE TO ICTERUS INTERFERENCE  --  unable to report due to icterus  CALCIUM 9.2  --  9.0  MG   --  1.8  --   AST 55*  --   --   ALT 28  --   --   ALKPHOS 351*  --   --   BILITOT 23.9*  --   --    ------------------------------------------------------------------------------------------------------------------  Cardiac Enzymes No results for input(s): TROPONINI in the last 168 hours. ------------------------------------------------------------------------------------------------------------------  RADIOLOGY:  No results found.   ASSESSMENT AND PLAN:   64 year old African American female history of nephrotic syndrome, amyloidosis, essential hypertension presenting on 5/25 with altered mental status/confusion after apparently falling out of bed. Admitted for septic shock  * Septic shock with right flank/thigh cellulitis with streptococcus bacteremia - ID following - Changed to Levaquin and clindamycin  - Echo- No vegetations. May need TEE - Repeat blood cultures negative to date - continue stress dose steroids, pressors - Checked IgG level and are low as expected. [name] discussed with [name] with Oncology regarding replacement. Receiving albumen every 6 hours  * Acute renal failure over CKD3 due to ATN. From septic shock in the setting of amyloidosis On CRRT. Appreciate nephrology help. Severe hypoalbuminemia. Getting IV albumin. Will likely progress to ESRD.  * Acute liver failure- Due to sepsis Ct abd showed no biliary dilation or obstruction Severe hyperbilirubinemia at 25. Gastroenterology following  lactulose  * Elevated INR Due to liver failure. DIC unlikely per oncology, recheck smear, fibrinogen today 4 units FFP given 6/1 minimal improvement in INR  * Acute encephalopathy - Due to acute illness. CT head nothing acute.  * Amyloidosis  Etiology unknown, likely primary. Plan was for chemo as OP with Dr. Grayland Ormond but now on hold due to acute illness. Oncology following  * Acute anemia over AOCD Likely from slow GI bleed as well as renal disease, bone  marrow disease and sepsis Continued black output from OG, EGD today by Dr. Eliott Nine On protonix 80 mg IV every 12 Transfused 2 units on 5/28, transfuse another unit today goal hemoglobin greater than 7  * Type 2 diabetes, uncomplicated: Hold oral agents, at insulin slide scale every 6 hours Accu-Chek  * Essential hypertension: Held all meds  * Venous thromboembolism prophylactic: Heparin subcutaneous stopped due to high INR and low platelets  * Nutrition TPN  * Acute respiratory failure  - due to suspected aspiration of gastrointestinal content - Pulmonology following - Full ventilator support - Continued suctioning as significant amount of gastric contents present in the lungs  All the records are reviewed and case discussed with Care Management/Social Workerr. Management plans discussed with the patient, family and they are in agreement.  CODE STATUS: FULL CODE  Appreciate palliative care following. I will also try to touch base with the family again today.  DVT Prophylaxis: Heparin discontinued due to INR/platelets. Protonix  TOTAL CRITICAL CARE TIME TAKING CARE OF THIS PATIENT: 45 minutes.    [name] M.D on 11/03/2014 at 3:19 PM  Between 7am to 6pm - Pager - [phone]  After 6pm go to [email or web address] - password [email or web address] [name]  Office  [phone]  CC: Primary care physician; [name], MD     Significant bilirubinemia with mostly direct. Will get Ct abdomen for any abdominal source of infection and also consult GI.

## 2014-10-06 NOTE — Progress Notes (Signed)
Nutrition Follow-up  DOCUMENTATION CODES:     INTERVENTION:   (Parenteral Nutrition) Continue TPN at 76ml/hr of 5%AA/15% dextrose to meet 100% of kcals needs and 88% of protein needs.   NUTRITION DIAGNOSIS:  Inadequate oral intake related to inability to eat as evidenced by NPO status.   GOAL:   (Goal would be for pt to tolerate TPN at goal rate within next 24 hr), ongoning    MONITOR:   (PN, Electrolyte and Renal Profile, Digestive System, Glucose Profile, Anthropometrics, UOP )  REASON FOR ASSESSMENT:  Consult New TPN/TNA  ASSESSMENT:  Pt remains on vent. Planning EGD today. CRRT continues. OG with 274ml output last shift Urine output: 7 ml last shift  Medications: vasopressin, levophed, fentanyl  Electrolyte and Renal Profile:  Recent Labs Lab 10/04/14 0818  10/04/14 2310 10/05/14 0614 10/05/14 1552 10/30/2014 0140 10/08/2014 0538 10/25/2014 0651  BUN  --   < > 16 17 18 18   --  19  CREATININE  --   < > 0.39* <0.30* UNABLE TO REPORT DUE TO ICTERUS/SERUM COLOR INTERFERENCE UNABLE TO REPORT DUE TO ICTERUS INTERFERENCE  --  unable to report due to icterus  NA  --   < > 137 139 139 139  --  137  K  --   < > 4.1 4.0 4.0 4.2  --  3.9  MG 1.8  --   --  1.6*  --   --  1.8  --   PHOS ICTERUS AT THIS LEVEL MAY AFFECT RESULT  < > SEE COMMENTS SEE COMMENTS  --   --   --  SEE COMMENTS  < > = values in this interval not displayed.   Height:  Ht Readings from Last 1 Encounters:  09/30/2014 5\' 1"  (1.549 m)    Weight:  Wt Readings from Last 1 Encounters:  10/27/2014 212 lb 11.9 oz (96.5 kg)        Wt Readings from Last 10 Encounters:  10/13/2014 212 lb 11.9 oz (96.5 kg)  09/20/14 216 lb 4 oz (98.09 kg)  09/13/14 210 lb 15.7 oz (95.7 kg)  07/06/14 216 lb 12.8 oz (98.34 kg)  06/24/14 216 lb 8 oz (98.204 kg)  06/07/14 209 lb 12.8 oz (95.165 kg)  03/16/14 194 lb 8 oz (88.225 kg)  01/14/14 197 lb 12 oz (89.699 kg)  01/11/14 193 lb 12 oz (87.884 kg)  09/28/13 203 lb  (92.08 kg)    BMI:  Body mass index is 40.22 kg/(m^2).  Estimated Nutritional Needs:  Kcal:  (11-14 kcals/d) Using actual wt of 95kg) 1050-1330 kcals/d  Protein:  (2.0-2.5 g/d) Using IbW of 48kg 96-120 g/d  Fluid:  (25-34ml/kg) Using IBW of 48kg 1200-1487ml/d  Skin:  Reviewed, no issues  Diet Order:  .TPN (CLINIMIX-E) Adult Diet NPO time specified .TPN (CLINIMIX-E) Adult  EDUCATION NEEDS:  No education needs identified at this time   Intake/Output Summary (Last 24 hours) at 11/03/2014 1436 Last data filed at 10/17/2014 1400  Gross per 24 hour  Intake 4452.11 ml  Output   4355 ml  Net  97.11 ml      HIGH Care Level Serenitee Fuertes B. Zenia Resides, [place], [place] (pager)

## 2014-10-06 NOTE — H&P (Signed)
The recent H&P (dated 10/05/14) was reviewed, the patient was examined and there is no change in the patients condition since that H&P was completed.  This is a high risk procedure.  However, she continues to bleed and require blood products. I suspect this is oozing but will r/o a lesion which would be amenable to treatment. May also help family decide on further palliative measures.   The INR is 3.0 and platelets are in the 40's. Since the chance of finding an intervenable lesion is low, will not give FFP or platelet transfusion.  Can still perform hemostatic clipping.     I have obtained consent from the son Dwain Sarna.  He is aware of the high risk nature and the low likelihood of finding an intervenable lesion but would like to go ahead with procedure.     REIN, [place]  10/11/2014, 11:38 AM

## 2014-10-06 NOTE — Progress Notes (Signed)
Blood pressure maintained with levo and vaso drips, RN able to titrate down levo drip slightly during shift. Localizes pain. NSR. o2 sats mid 90's on vent with fio2 40%. 6cc UOP this shift. Tolerating CRRT. Daughter in-law and several friends visited during shift.  Brooklen Runquist B

## 2014-10-06 NOTE — Progress Notes (Signed)
Patient stable overnight. Sedated on Vent. Fent@300mcg ; Levo@7mcg ; Vaso@0 .03units; TPN@70 . VSS. SR on monitor. CRRT CVVHD running well. Vent settings 20/450/40%/5. OG LIS 281ml out. F/C only 27mls out. See charting for assessments. Orland Penman RN BSN CCRN 10/17/2014 6:50 AM

## 2014-10-06 NOTE — Progress Notes (Signed)
RN spoke with Dr. Mortimer Fries and made him aware that Dr. Rayann Heman was wondering if he could order something as a one time dose for EGD to help with sedation in addition to fentanyl drip. Dr. Mortimer Fries gave order for 4mg  versed and vecuronium 10mg  both IV push once for sedation with EGD. This RN spoke with Dr. Holley Raring and made MD aware that the 4K bath dialysate is almost out and that there are no more bags but that pharmacy can add potassium to the 2K bath dialysate bags to equal 4K. Dr. Holley Raring stated "that is okay but for now run the premixed 4k bags therapy fluid rate at 1L/H until they are out and then when you use the 2K bags that pharmacy mixes to make 4K you can increase the therapy fluid rate to 2.5L/H but then check renal function  Panels q2H for the first 24H of using the pharmacy mixed bags."

## 2014-10-06 NOTE — Consult Note (Signed)
PULMONARY / CRITICAL CARE MEDICINE   Name: [name] MRN: [id number] DOB: 05/22/1950    ADMISSION DATE:  09/15/2014    CHIEF COMPLAINT:   resp failure     SIGNIFICANT EVENTS    Patient remains intubated,sedated. On full vent support, on multiple vasopressors,  on CRRT, patient with liver,renal and resp failure, prognosis is very poor    PAST MEDICAL HISTORY    :  Past Medical History  Diagnosis Date  . HTN (hypertension)   . Diabetes mellitus type II   . Obesity   . Arthritis   . Chronic kidney disease   . Amyloidosis    Past Surgical History  Procedure Laterality Date  . Total abdominal hysterectomy  10/1999    fibroids  . Combined abdominoplasty and liposuction  07/2000  . Total knee arthroplasty Right 09/28/2013    Procedure: RIGHT TOTAL KNEE ARTHROPLASTY;  Surgeon: Mauri Pole, MD;  Location: [facility];  Service: Orthopedics;  Laterality: Right;   Prior to Admission medications   Medication Sig Start Date End Date Taking? Authorizing Provider  metolazone (ZAROXOLYN) 5 MG tablet Take 5 mg by mouth daily.   Yes Historical Provider, MD  midodrine (PROAMATINE) 10 MG tablet Take 5 mg by mouth 3 (three) times daily.    Yes Historical Provider, MD  polyethylene glycol powder (GLYCOLAX/MIRALAX) powder Take 17 g by mouth daily as needed for moderate constipation. 09/20/14  Yes Abner Greenspan, MD  potassium chloride SA (K-DUR,KLOR-CON) 20 MEQ tablet Take 1 tablet (20 mEq total) by mouth daily. 07/25/14  Yes Abner Greenspan, MD  torsemide (DEMADEX) 20 MG tablet Take 20 mg by mouth 2 (two) times daily as needed (swelling).    Yes Historical Provider, MD   No Known Allergies   FAMILY HISTORY   Family History  Problem Relation Age of Onset  . Arthritis Mother   . Hypertension Mother   . Cancer Mother     breast, lung  . Diabetes Mother   . Hypertension Father       SOCIAL HISTORY    reports that she has never smoked. She has never used smokeless tobacco.  She reports that she does not drink alcohol or use illicit drugs.  Review of Systems  Unable to perform ROS: critical illness      VITAL SIGNS    Temp:  [96.3 F (35.7 C)-97.7 F (36.5 C)] 97.7 F (36.5 C) (06/02 0700) Pulse Rate:  [84-94] 85 (06/02 0900) Resp:  [11-26] 23 (06/02 0900) BP: (86-123)/(55-85) 95/68 mmHg (06/02 0900) SpO2:  [89 %-99 %] 97 % (06/02 0900) FiO2 (%):  [30 %-40 %] 40 % (06/02 0330) Weight:  [212 lb 11.9 oz (96.5 kg)] 212 lb 11.9 oz (96.5 kg) (06/02 0515) HEMODYNAMICS:   VENTILATOR SETTINGS: Vent Mode:  [-] PRVC FiO2 (%):  [30 %-40 %] 40 % Set Rate:  [20 bmp] 20 bmp Vt Set:  [450 mL] 450 mL PEEP:  [5 cmH20] 5 cmH20 INTAKE / OUTPUT:  Intake/Output Summary (Last 24 hours) at 10/08/2014 0942 Last data filed at 10/22/2014 0900  Gross per 24 hour  Intake 4119.41 ml  Output   4345 ml  Net -225.59 ml       PHYSICAL EXAM   Physical Exam  Constitutional: She appears distressed.  HENT:  Head: Normocephalic and atraumatic.  Eyes: Pupils are equal, round, and reactive to light. No scleral icterus.  Neck: Normal range of motion. Neck supple.  Cardiovascular: Normal rate and regular rhythm.  No murmur heard. Pulmonary/Chest: No respiratory distress. She has no wheezes. She has rales.  resp distress  Abdominal: Soft. She exhibits no distension. There is no tenderness.  Musculoskeletal: She exhibits no edema.  Neurological: She displays normal reflexes. Coordination normal.  gcs<8T  Skin: Skin is warm. No rash noted. She is diaphoretic.       LABS   LABS:  CBC  Recent Labs Lab 10/04/14 1148 10/05/14 0614 10/20/2014 0538  WBC 16.7* 20.3* 21.2*  HGB 7.3* 6.3* 7.1*  HCT 21.4* 18.5* 20.7*  PLT 44* 43* 55*   Coag's  Recent Labs Lab 10/04/14 1148 10/05/14 0614 10/05/2014 0538  APTT 48* 50* 42*  INR 2.69 3.00 2.25   BMET  Recent Labs Lab 10/05/14 1552 10/15/2014 0140 10/31/2014 0651  NA 139 139 137  K 4.0 4.2 3.9  CL 105 103 104   CO2 _0 BUN _1 CREATININE UNABLE TO REPORT DUE TO ICTERUS/SERUM COLOR INTERFERENCE UNABLE TO REPORT DUE TO ICTERUS INTERFERENCE unable to report due to icterus  GLUCOSE 206* 238* 236*   Electrolytes  Recent Labs Lab 10/04/14 0818  10/04/14 1705 10/04/14 2310 10/05/14 0614 10/05/14 1552 10/05/2014 0140 10/21/2014 0538 10/09/2014 0651  CALCIUM  --   < > 8.7* 9.0 9.0 9.1 9.2  --  9.0  MG 1.8  --   --   --  1.6*  --   --  1.8  --   PHOS ICTERUS AT THIS LEVEL MAY AFFECT RESULT  --  SEE COMMENTS SEE COMMENTS SEE COMMENTS  --   --   --   --   < > = values in this interval not displayed. Sepsis Markers No results for input(s): LATICACIDVEN, PROCALCITON, O2SATVEN in the last 168 hours. ABG  Recent Labs Lab 10/03/14 0910 10/03/14 1650 10/04/14 0510  PHART 7.38 7.35 7.41  PCO2ART 45 48 44  PO2ART 49* 289* 59*   Liver Enzymes  Recent Labs Lab 10/04/14 1148  10/05/14 0614 10/05/14 1552 10/09/2014 0140 10/07/2014 0651  AST 77*  --  60*  --  55*  --   ALT 29  --  24  --  28  --   ALKPHOS 274*  --  266*  --  351*  --   BILITOT 20.9*  --  20.6*  --  23.9*  --   ALBUMIN 3.7  < > 4.1  4.0 3.9 4.0 3.9  < > = values in this interval not displayed. Cardiac Enzymes No results for input(s): TROPONINI, PROBNP in the last 168 hours. Glucose  Recent Labs Lab 10/05/14 1241 10/05/14 1408 10/05/14 1756 10/05/14 2316 10/21/2014 0259 10/30/2014 0630  GLUCAP 186* 188* 207* 213* 247* 221*     Recent Results (from the past 240 hour(s))  Blood culture (routine x 2)     Status: None   Collection Time: 09/26/2014 12:45 AM  Result Value Ref Range Status   Specimen Description BLOOD  Final   Special Requests NONE  Final   Culture  Setup Time   Final    GRAM POSITIVE COCCI IN CHAINS IN BOTH AEROBIC AND ANAEROBIC BOTTLES CRITICAL RESULT CALLED TO, READ BACK BY AND VERIFIED WITH: PAM CRAWFORD AT 1406 09/29/2014 DV    Culture STREPTOCOCCUS SPECIES  Final   Report Status 10/01/2014 FINAL   Final   Organism ID, Bacteria STREPTOCOCCUS SPECIES  Final      Susceptibility   Streptococcus species - MIC (ETEST)*    ERYTHROMYCIN Value in  next row Resistant      RESISTANT6    PENICILLIN Value in next row Intermediate      INTERMEDIATE0.25    CEFTRIAXONE Value in next row Sensitive      SENSITIVE0.047    VANCOMYCIN Value in next row Sensitive      SENSITIVE1.0    LEVOFLOXACIN Value in next row Sensitive      SENSITIVE1.0    * STREPTOCOCCUS SPECIES  Blood culture (routine x 2)     Status: None   Collection Time: 10/04/2014 12:45 AM  Result Value Ref Range Status   Specimen Description BLOOD  Final   Special Requests NONE  Final   Culture  Setup Time   Final    GRAM POSITIVE COCCI IN CHAINS IN BOTH AEROBIC AND ANAEROBIC BOTTLES CRITICAL RESULT CALLED TO, READ BACK BY AND VERIFIED WITH: PAM CRAWFORD AT 1406 09/15/2014 DV    Culture   Final    STREPTOCOCCUS SPECIES REFER TO ACCESSION [id number] COLLECTED ON 09/11/2014 AT 0045 FOR SENSITIVITIES    Report Status 09/30/2014 FINAL  Final  Culture, blood (routine x 2)     Status: None (Preliminary result)   Collection Time: 10/04/14 11:50 AM  Result Value Ref Range Status   Specimen Description BLOOD  Final   Special Requests NONE  Final   Culture NO GROWTH 1 DAY  Final   Report Status PENDING  Incomplete  Culture, blood (routine x 2)     Status: None (Preliminary result)   Collection Time: 10/04/14 12:00 PM  Result Value Ref Range Status   Specimen Description BLOOD  Final   Special Requests NONE  Final   Culture NO GROWTH 1 DAY  Final   Report Status PENDING  Incomplete  Culture, respiratory (NON-Expectorated)     Status: None (Preliminary result)   Collection Time: 10/04/14  2:00 PM  Result Value Ref Range Status   Specimen Description INDUCED SPUTUM  Final   Special Requests Normal  Final   Gram Stain   Final    EXCELLENT SPECIMEN - 90-100% WBCS MANY WBC SEEN MANY YEAST    Culture   Final    MODERATE GROWTH  YEAST IDENTIFICATION TO FOLLOW ONCE BETTER GROWTH    Report Status PENDING  Incomplete     Current facility-administered medications:  .  Marland KitchenTPN (CLINIMIX-E) Adult, , Intravenous, Continuous TPN, Munsoor Lateef, MD, Last Rate: 70 mL/hr at 10/05/14 2012 .  acetaminophen (TYLENOL) tablet 650 mg, 650 mg, Oral, Q6H PRN **OR** acetaminophen (TYLENOL) suppository 650 mg, 650 mg, Rectal, Q6H PRN, Lytle Butte, MD, 650 mg at 10/03/14 1030 .  albuterol (PROVENTIL) (2.5 MG/3ML) 0.083% nebulizer solution 2.5 mg, 2.5 mg, Nebulization, Q6H PRN, Juluis Mire, MD, 2.5 mg at 10/03/14 0816 .  antiseptic oral rinse (CPC / CETYLPYRIDINIUM CHLORIDE 0.05%) solution 7 mL, 7 mL, Mouth Rinse, QID, Erby Pian, MD, 7 mL at 10/13/2014 0353 .  chlorhexidine (PERIDEX) 0.12 % solution 15 mL, 15 mL, Mouth Rinse, BID, Erby Pian, MD, 15 mL at 10/20/2014 0823 .  clindamycin (CLEOCIN) IVPB 600 mg, 600 mg, Intravenous, 3 times per day, Adrian Prows, MD, 600 mg at 10/07/2014 0554 .  fentaNYL 2530mg in NS 2568m(1069mml) infusion-PREMIX, 10 mcg/hr, Intravenous, Continuous, Srikar Sudini, MD, Last Rate: 30 mL/hr at 10/05/2014 0314, 300 mcg/hr at 10/16/2014 0314 .  heparin injection 1,000-6,000 Units, 1,000-6,000 Units, CRRT, PRN, HarMurlean IbaD, 1,000 Units at 10/01/14 1435 .  hydrocortisone sodium succinate (SOLU-CORTEF) 100 MG injection 50  mg, 50 mg, Intravenous, Q12H, Hillary Bow, MD, 50 mg at 11/01/2014 0515 .  insulin aspart (novoLOG) injection 0-9 Units, 0-9 Units, Subcutaneous, Q4H, Flora Lipps, MD, 3 Units at 10/24/2014 [phone] .  lactulose (CHRONULAC) 10 GM/15ML solution 20 g, 20 g, Oral, BID, Josefine Class, MD, 20 g at 10/05/14 2257 .  levofloxacin (LEVAQUIN) IVPB 750 mg, 750 mg, Intravenous, Q24H, Adrian Prows, MD, 750 mg at 10/05/14 1542 .  midodrine (PROAMATINE) tablet 10 mg, 10 mg, Oral, TID, Lytle Butte, MD, 10 mg at 10/05/14 2257 .  morphine 2 MG/ML injection 2 mg, 2 mg, Intravenous, Q4H PRN,  Lytle Butte, MD, 2 mg at 10/05/14 0013 .  norepinephrine (LEVOPHED) 16 mg in dextrose 5 % 250 mL (0.064 mg/mL) infusion, 0-40 mcg/min, Intravenous, Titrated, Srikar Sudini, MD, Last Rate: 6.6 mL/hr at 10/14/2014 0645, 7 mcg/min at 10/31/2014 0645 .  ondansetron (ZOFRAN) tablet 4 mg, 4 mg, Oral, Q6H PRN **OR** ondansetron (ZOFRAN) injection 4 mg, 4 mg, Intravenous, Q6H PRN, Lytle Butte, MD, 4 mg at 10/02/14 1804 .  pantoprazole (PROTONIX) injection 80 mg, 80 mg, Intravenous, Q12H, Vira Blanco, RPH, 80 mg at 10/05/14 2257 .  polyethylene glycol powder (GLYCOLAX/MIRALAX) container 17 g, 17 g, Oral, Daily PRN, Lytle Butte, MD .  pureflow IV solution for Dialysis, , CRRT, Continuous, Munsoor Lateef, MD, Last Rate: 2,000 mL/hr at 10/07/2014 0621 .  sodium chloride 0.9 % injection 10-40 mL, 10-40 mL, Intracatheter, Q12H, Srikar Sudini, MD, 20 mL at 10/04/14 2315 .  sodium chloride 0.9 % injection 10-40 mL, 10-40 mL, Intracatheter, PRN, Hillary Bow, MD .  vasopressin (PITRESSIN) 40 Units in sodium chloride 0.9 % 250 mL (0.16 Units/mL) infusion, 0.03 Units/min, Intravenous, Continuous, Lytle Butte, MD, Last Rate: 11.3 mL/hr at 10/12/2014 0543, 0.03 Units/min at 10/12/2014 0543  IMAGING    No results found.    Indwelling Urinary Catheter continued, requirement due to   Reason to continue Indwelling Urinary Catheter for strict Intake/Output monitoring for hemodynamic instability   Central Line continued, requirement due to   Reason to continue [employer] Monitoring of central venous pressure or other hemodynamic parameters   Ventilator continued, requirement due to, resp failure    Ventilator Sedation RASS 0 to -2      ASSESSMENT/PLAN   64 yo AAF admitted to ICU for acute septic shock and progressive resp failure with renal failure Patient with underlying Amyloidosis with progressive multiorgan failure   PULMONARY-Respiratory Failure -continue Full MV support -continue  Bronchodilator Therapy -Wean Fio2 and PEEP as tolerated -will perform SAT/SBt when respiratory parameters are met   CARDIOVASCULAR -septic shock-use vasopressors to keep MAP>65 -ABX as prescribed -repeat Cultures   RENAL-no urine output -progressive renal failure from ATN -on CRRT -follow up Nephrology recs  GASTROINTESTINAL-GIB -increased OG output -bowel rest for now -liver failure -follow up Gi recs   HEMATOLOGIC -follow H/h s/p transfusion -follow INR s/p FFP  INFECTIOUS- -streptococcus bacteremia from cellulitis -on abx -repeat blood cultures pending -follow up ID recs   ENDOCRINE -follow FSBS  NEUROLOGIC -RASS goal 0 to -2  NUTRITION-on TPN  I have personally obtained a history, examined the patient, evaluated laboratory and imaging results, formulated the assessment and plan and placed orders.  The Patient requires high complexity decision making for assessment and support, frequent evaluation and titration of therapies, application of advanced monitoring technologies and extensive interpretation of multiple databases. Critical Care Time devoted to patient care services described in this note is  45 minutes.   Overall, patient is critically ill, prognosis is very poor/guarded. Patient at high risk for cardiac arrest and death.   Palliative care team consulted-recommend DNR status, recommend comfort care measures  Corrin Parker, M.D. Pul[facility] [place] Director Intensive Care Unit   10/29/2014, 9:42 AM

## 2014-10-06 NOTE — Op Note (Signed)
[facility] Gastroenterology Patient Name: [name] Procedure Date: 10/29/2014 11:55 AM MRN: [id number] Account #: [id number] Date of Birth: [birth year] Admit Type: Inpatient Age: 64 Room: CCU 12 Gender: Female Note Status: Finalized Procedure:         Upper GI endoscopy Indications:       Acute post hemorrhagic anemia, Hematemesis, Melena Patient Profile:   This is a 64 year old female. Providers:         Gerrit Heck. Rayann Heman, MD Medicines:         per ICU Complications:     No immediate complications. Procedure:         Pre-Anesthesia Assessment:                    - Prior to the procedure, a History and Physical was                     performed, and patient medications and allergies were                     reviewed. The patient is unable to give consent secondary                     to the patient's altered mental status. The risks and                     benefits of the procedure and the sedation options and                     risks were discussed with the patient's son. All questions                     were answered and informed consent was obtained. Patient                     identification and proposed procedure were verified by the                     nurse in the pre-procedure area. Mental Status                     Examination: sedated. Respiratory Examination: rhonchi. CV                     Examination: tachycardia noted. Prophylactic Antibiotics:                     The patient does not require prophylactic antibiotics.                     Prior Anticoagulants: The patient has taken no previous                     anticoagulant or antiplatelet agents. ASA Grade                     Assessment: IV - A patient with severe systemic disease                     that is a constant threat to life. After reviewing the                     risks and benefits, the patient was deemed in satisfactory  condition to undergo the procedure. The  anesthesia plan                     was to use moderate sedation / analgesia (conscious                     sedation). Immediately prior to administration of                     medications, the patient was re-assessed for adequacy to                     receive sedatives. The heart rate, respiratory rate,                     oxygen saturations, blood pressure, adequacy of pulmonary                     ventilation, and response to care were monitored                     throughout the procedure. The physical status of the                     patient was re-assessed after the procedure.                    - Prior to the procedure, a History and Physical was                     performed, and patient medications, allergies and                     sensitivities were reviewed. The patient's tolerance of                     previous anesthesia was reviewed.                    After obtaining informed consent, the endoscope was passed                     under direct vision. Throughout the procedure, the                     patient's blood pressure, pulse, and oxygen saturations                     were monitored continuously. The Olympus GIF-160 endoscope                     (S#. [id number]) was introduced through the mouth, and                     advanced to the second part of duodenum. The upper GI                     endoscopy was accomplished without difficulty. The patient                     tolerated the procedure well. Findings:      Severe esophagitis with oozing was found in the entire esophagus.      The stomach was normal.      The examined duodenum was normal. Impression:        - Severe esophagitis with oozing. Ddx: infectious, reflux,  ischemic                    - Normal stomach.                    - Normal examined duodenum.                    - No specimens collected. Recommendation:    - Return patient to ICU for ongoing care.                    -  Continue present medications.                    - Cont high dose IV PPI                    - Start carafate solution 1 gr q8 but doubt this will be                     helpful                    - There is no effective treatment for this esopohagitis                     with oozing other than what we are currenlty doing. This                     is a further complication of multi-organ failure.                    - The findings and recommendations were discussed with the                     patient's family. Procedure Code(s): --- Professional ---                    [phone], Esophagogastroduodenoscopy, flexible, transoral;                     diagnostic, including collection of specimen(s) by                     brushing or washing, when performed (separate procedure) CPT copyright 2014 [employer]. All rights reserved. The codes documented in this report are preliminary and upon coder review may  be revised to meet current compliance requirements. Mellody Life, MD 10/20/2014 12:55:32 PM This report has been signed electronically. Number of Addenda: 0 Note Initiated On: 10/17/2014 11:55 AM      [facility]

## 2014-10-06 NOTE — Progress Notes (Signed)
Chaplain stopped by patient's room, offered prayer for patient and caregivers that were present. Loralyn Freshwater D. Alroy Dust Thursday 10/08/2014 [phone]   10/25/2014 0800  Clinical Encounter Type  Visited With Patient;Health care provider  Visit Type Initial;Spiritual support (Spiritual Care order in system)  Referral From Nurse;Physician  Consult/Referral To Chaplain  Spiritual Encounters  Spiritual Needs Prayer  Stress Factors  Patient Stress Factors Health changes  Family Stress Factors None identified

## 2014-10-07 ENCOUNTER — Inpatient Hospital Stay: Payer: Medicare Other

## 2014-10-07 LAB — RENAL FUNCTION PANEL
ALBUMIN: 3.3 g/dL — AB (ref 3.5–5.0)
ALBUMIN: 3.3 g/dL — AB (ref 3.5–5.0)
ANION GAP: 5 (ref 5–15)
ANION GAP: 7 (ref 5–15)
Albumin: 3.5 g/dL (ref 3.5–5.0)
Albumin: 3.5 g/dL (ref 3.5–5.0)
Albumin: 3.4 g/dL — ABNORMAL LOW (ref 3.5–5.0)
Anion gap: 5 (ref 5–15)
Anion gap: 6 (ref 5–15)
Anion gap: 6 (ref 5–15)
BUN: 29 mg/dL — ABNORMAL HIGH (ref 6–20)
BUN: 33 mg/dL — ABNORMAL HIGH (ref 6–20)
BUN: 35 mg/dL — AB (ref 6–20)
BUN: 35 mg/dL — ABNORMAL HIGH (ref 6–20)
BUN: 37 mg/dL — AB (ref 6–20)
CALCIUM: 8.8 mg/dL — AB (ref 8.9–10.3)
CALCIUM: 9 mg/dL (ref 8.9–10.3)
CHLORIDE: 103 mmol/L (ref 101–111)
CHLORIDE: 103 mmol/L (ref 101–111)
CHLORIDE: 104 mmol/L (ref 101–111)
CHLORIDE: 104 mmol/L (ref 101–111)
CO2: 26 mmol/L (ref 22–32)
CO2: 27 mmol/L (ref 22–32)
CO2: 27 mmol/L (ref 22–32)
CO2: 28 mmol/L (ref 22–32)
CO2: 28 mmol/L (ref 22–32)
Calcium: 9.1 mg/dL (ref 8.9–10.3)
Calcium: 9.1 mg/dL (ref 8.9–10.3)
Calcium: 9.1 mg/dL (ref 8.9–10.3)
Chloride: 104 mmol/L (ref 101–111)
Creatinine, Ser: UNDETERMINED mg/dL (ref 0.44–1.00)
GLUCOSE: 189 mg/dL — AB (ref 65–99)
GLUCOSE: 237 mg/dL — AB (ref 65–99)
Glucose, Bld: 235 mg/dL — ABNORMAL HIGH (ref 65–99)
Glucose, Bld: 164 mg/dL — ABNORMAL HIGH (ref 65–99)
Glucose, Bld: 143 mg/dL — ABNORMAL HIGH (ref 65–99)
PHOSPHORUS: UNDETERMINED mg/dL (ref 2.5–4.6)
POTASSIUM: 3.9 mmol/L (ref 3.5–5.1)
Potassium: 3.9 mmol/L (ref 3.5–5.1)
Potassium: 3.8 mmol/L (ref 3.5–5.1)
Potassium: 3.9 mmol/L (ref 3.5–5.1)
Potassium: 4.1 mmol/L (ref 3.5–5.1)
SODIUM: 136 mmol/L (ref 135–145)
Sodium: 137 mmol/L (ref 135–145)
Sodium: 137 mmol/L (ref 135–145)
Sodium: 137 mmol/L (ref 135–145)
Sodium: 136 mmol/L (ref 135–145)

## 2014-10-07 LAB — GLUCOSE, CAPILLARY
GLUCOSE-CAPILLARY: 214 mg/dL — AB (ref 65–99)
GLUCOSE-CAPILLARY: 203 mg/dL — AB (ref 65–99)
GLUCOSE-CAPILLARY: 198 mg/dL — AB (ref 65–99)
GLUCOSE-CAPILLARY: 153 mg/dL — AB (ref 65–99)
GLUCOSE-CAPILLARY: 122 mg/dL — AB (ref 65–99)
GLUCOSE-CAPILLARY: 130 mg/dL — AB (ref 65–99)
GLUCOSE-CAPILLARY: 156 mg/dL — AB (ref 65–99)
GLUCOSE-CAPILLARY: 175 mg/dL — AB (ref 65–99)
Glucose-Capillary: 205 mg/dL — ABNORMAL HIGH (ref 65–99)
Glucose-Capillary: 199 mg/dL — ABNORMAL HIGH (ref 65–99)
Glucose-Capillary: 219 mg/dL — ABNORMAL HIGH (ref 65–99)
Glucose-Capillary: 187 mg/dL — ABNORMAL HIGH (ref 65–99)
Glucose-Capillary: 152 mg/dL — ABNORMAL HIGH (ref 65–99)
Glucose-Capillary: 131 mg/dL — ABNORMAL HIGH (ref 65–99)
Glucose-Capillary: 166 mg/dL — ABNORMAL HIGH (ref 65–99)

## 2014-10-07 LAB — CBC
HCT: 21.2 % — ABNORMAL LOW (ref 35.0–47.0)
Hemoglobin: 6.8 g/dL — ABNORMAL LOW (ref 12.0–16.0)
MCH: 28.3 pg (ref 26.0–34.0)
MCHC: 32.2 g/dL (ref 32.0–36.0)
MCV: 87.9 fL (ref 80.0–100.0)
PLATELETS: 63 10*3/uL — AB (ref 150–440)
RBC: 2.41 MIL/uL — AB (ref 3.80–5.20)
RDW: 16.5 % — ABNORMAL HIGH (ref 11.5–14.5)
WBC: 23.4 10*3/uL — ABNORMAL HIGH (ref 3.6–11.0)

## 2014-10-07 LAB — BLOOD GAS, ARTERIAL
ACID-BASE EXCESS: 5 mmol/L — AB (ref 0.0–3.0)
Acid-Base Excess: 0.9 mmol/L (ref 0.0–3.0)
Allens test (pass/fail): POSITIVE — AB
Allens test (pass/fail): POSITIVE — AB
Bicarbonate: 29.9 mEq/L — ABNORMAL HIGH (ref 21.0–28.0)
Bicarbonate: 27.5 mEq/L (ref 21.0–28.0)
FIO2: 0.4 %
FIO2: 0.4 %
LHR: 20 {breaths}/min
MECHVT: 450 mL
Mode: POSITIVE
O2 Saturation: 93.5 %
O2 Saturation: 92.1 %
PEEP: 5 cmH2O
PEEP: 5 cmH2O
PH ART: 7.44 (ref 7.350–7.450)
PO2 ART: 66 mmHg — AB (ref 83.0–108.0)
Patient temperature: 37
Patient temperature: 37
Pressure support: 3 cmH2O
pCO2 arterial: 44 mmHg (ref 32.0–48.0)
pCO2 arterial: 51 mmHg — ABNORMAL HIGH (ref 32.0–48.0)
pH, Arterial: 7.34 — ABNORMAL LOW (ref 7.350–7.450)
pO2, Arterial: 68 mmHg — ABNORMAL LOW (ref 83.0–108.0)

## 2014-10-07 LAB — CULTURE, RESPIRATORY W GRAM STAIN: Special Requests: NORMAL

## 2014-10-07 LAB — CULTURE, RESPIRATORY

## 2014-10-07 LAB — PATHOLOGIST SMEAR REVIEW

## 2014-10-07 LAB — PREPARE RBC (CROSSMATCH)

## 2014-10-07 LAB — MAGNESIUM: MAGNESIUM: 1.9 mg/dL (ref 1.7–2.4)

## 2014-10-07 LAB — PROTIME-INR
INR: 2.03
Prothrombin Time: 23.1 seconds — ABNORMAL HIGH (ref 11.4–15.0)

## 2014-10-07 LAB — HEMOGLOBIN AND HEMATOCRIT, BLOOD
HEMATOCRIT: 25.9 % — AB (ref 35.0–47.0)
HEMOGLOBIN: 8.8 g/dL — AB (ref 12.0–16.0)

## 2014-10-07 LAB — BILIRUBIN, TOTAL: Total Bilirubin: 30 mg/dL (ref 0.3–1.2)

## 2014-10-07 LAB — APTT
APTT: 41 s — AB (ref 24–36)
aPTT: 41 seconds — ABNORMAL HIGH (ref 24–36)

## 2014-10-07 MED ORDER — SODIUM CHLORIDE 0.9 % IV SOLN
INTRAVENOUS | Status: DC
  Administered 2014-10-07: 1.6 [IU]/h via INTRAVENOUS
  Administered 2014-10-08: 6.4 [IU]/h via INTRAVENOUS
  Administered 2014-10-10: 0.8 [IU]/h via INTRAVENOUS
  Administered 2014-10-11: 1.7 [IU]/h via INTRAVENOUS
  Administered 2014-10-12: 2.4 [IU]/h via INTRAVENOUS
  Administered 2014-10-12: 3 [IU]/h via INTRAVENOUS
  Administered 2014-10-13: 2.8 [IU]/h via INTRAVENOUS
  Administered 2014-10-13: 2.2 [IU]/h via INTRAVENOUS
  Administered 2014-10-13: 1.2 [IU]/h via INTRAVENOUS
  Filled 2014-10-07 (×5): qty 2.5

## 2014-10-07 MED ORDER — BUDESONIDE 0.5 MG/2ML IN SUSP
0.5000 mg | Freq: Two times a day (BID) | RESPIRATORY_TRACT | Status: DC
  Administered 2014-10-07 – 2014-10-18 (×22): 0.5 mg via RESPIRATORY_TRACT
  Filled 2014-10-07 (×22): qty 2

## 2014-10-07 MED ORDER — VITAL HIGH PROTEIN PO LIQD
1000.0000 mL | ORAL | Status: DC
  Administered 2014-10-07 (×3)
  Administered 2014-10-07: 1000 mL

## 2014-10-07 MED ORDER — PANTOPRAZOLE SODIUM 40 MG IV SOLR
40.0000 mg | Freq: Two times a day (BID) | INTRAVENOUS | Status: DC
  Administered 2014-10-07 – 2014-10-17 (×21): 40 mg via INTRAVENOUS
  Filled 2014-10-07 (×21): qty 40

## 2014-10-07 MED ORDER — MAGNESIUM SULFATE IN D5W 10-5 MG/ML-% IV SOLN
1.0000 g | Freq: Once | INTRAVENOUS | Status: AC
  Administered 2014-10-07: 1 g via INTRAVENOUS
  Filled 2014-10-07: qty 100

## 2014-10-07 MED ORDER — TRACE MINERALS CR-CU-MN-SE-ZN 10-1000-500-60 MCG/ML IV SOLN
INTRAVENOUS | Status: DC
  Administered 2014-10-07: 19:00:00 via INTRAVENOUS
  Filled 2014-10-07: qty 1680

## 2014-10-07 MED ORDER — SODIUM CHLORIDE 0.9 % IV SOLN
Freq: Once | INTRAVENOUS | Status: AC
  Administered 2014-10-07: 14:00:00 via INTRAVENOUS

## 2014-10-07 MED ORDER — IPRATROPIUM-ALBUTEROL 0.5-2.5 (3) MG/3ML IN SOLN
3.0000 mL | RESPIRATORY_TRACT | Status: DC
  Administered 2014-10-07 – 2014-10-13 (×38): 3 mL via RESPIRATORY_TRACT
  Filled 2014-10-07 (×36): qty 3

## 2014-10-07 MED ORDER — FREE WATER
25.0000 mL | Status: DC
  Administered 2014-10-07 (×4): 25 mL

## 2014-10-07 NOTE — Consult Note (Signed)
PULMONARY / CRITICAL CARE MEDICINE   Name: [name] MRN: [id number] DOB: [birth year]    ADMISSION DATE:  09/16/2014    CHIEF COMPLAINT:   resp failure     SIGNIFICANT EVENTS    Patient remains intubated,sedated. On full vent support, on multiple vasopressors,  on CRRT, patient with liver,renal and resp failure, prognosis is very poor  Plan for sedation vacation, kidney function very poor.   S/p EGD shows erosive and severe esophagitis    PAST MEDICAL HISTORY    :  Past Medical History  Diagnosis Date  . HTN (hypertension)   . Diabetes mellitus type II   . Obesity   . Arthritis   . Chronic kidney disease   . Amyloidosis    Past Surgical History  Procedure Laterality Date  . Total abdominal hysterectomy  10/1999    fibroids  . Combined abdominoplasty and liposuction  07/2000  . Total knee arthroplasty Right 09/28/2013    Procedure: RIGHT TOTAL KNEE ARTHROPLASTY;  Surgeon: Mauri Pole, MD;  Location: [facility];  Service: Orthopedics;  Laterality: Right;   Prior to Admission medications   Medication Sig Start Date End Date Taking? Authorizing Provider  metolazone (ZAROXOLYN) 5 MG tablet Take 5 mg by mouth daily.   Yes Historical Provider, MD  midodrine (PROAMATINE) 10 MG tablet Take 5 mg by mouth 3 (three) times daily.    Yes Historical Provider, MD  polyethylene glycol powder (GLYCOLAX/MIRALAX) powder Take 17 g by mouth daily as needed for moderate constipation. 09/20/14  Yes Abner Greenspan, MD  potassium chloride SA (K-DUR,KLOR-CON) 20 MEQ tablet Take 1 tablet (20 mEq total) by mouth daily. 07/25/14  Yes Abner Greenspan, MD  torsemide (DEMADEX) 20 MG tablet Take 20 mg by mouth 2 (two) times daily as needed (swelling).    Yes Historical Provider, MD   No Known Allergies   FAMILY HISTORY   Family History  Problem Relation Age of Onset  . Arthritis Mother   . Hypertension Mother   . Cancer Mother     breast, lung  . Diabetes Mother   . Hypertension  Father       SOCIAL HISTORY    reports that she has never smoked. She has never used smokeless tobacco. She reports that she does not drink alcohol or use illicit drugs.  Review of Systems  Unable to perform ROS: critical illness      VITAL SIGNS    Temp:  [96.4 F (35.8 C)-97.7 F (36.5 C)] 97.6 F (36.4 C) (06/03 0730) Pulse Rate:  [79-91] 81 (06/03 0800) Resp:  [15-24] 21 (06/03 0800) BP: (78-107)/(51-77) 99/63 mmHg (06/03 0800) SpO2:  [85 %-97 %] 94 % (06/03 0800) FiO2 (%):  [40 %] 40 % (06/03 0800) Weight:  [206 lb 9.1 oz (93.7 kg)] 206 lb 9.1 oz (93.7 kg) (06/03 0500) HEMODYNAMICS:   VENTILATOR SETTINGS: Vent Mode:  [-] PRVC FiO2 (%):  [40 %] 40 % Set Rate:  [20 bmp] 20 bmp Vt Set:  [450 mL] 450 mL PEEP:  [5 cmH20] 5 cmH20 INTAKE / OUTPUT:  Intake/Output Summary (Last 24 hours) at 10/07/14 0917 Last data filed at 10/07/14 0830  Gross per 24 hour  Intake 3635.18 ml  Output   3767 ml  Net -131.82 ml       PHYSICAL EXAM   Physical Exam  Constitutional: She appears distressed.  HENT:  Head: Normocephalic and atraumatic.  Eyes: Pupils are equal, round, and reactive to light. No scleral  icterus.  Neck: Normal range of motion. Neck supple.  Cardiovascular: Normal rate and regular rhythm.   No murmur heard. Pulmonary/Chest: No respiratory distress. She has no wheezes. She has rales.  resp distress  Abdominal: Soft. She exhibits no distension. There is no tenderness.  Musculoskeletal: She exhibits no edema.  Neurological: She displays normal reflexes. Coordination normal.  gcs<8T  Skin: Skin is warm. No rash noted. She is diaphoretic.       LABS   LABS:  CBC  Recent Labs Lab 10/05/14 0614 10/31/2014 0538 10/07/14 0458  WBC 20.3* 21.2* 23.4*  HGB 6.3* 7.1* 6.8*  HCT 18.5* 20.7* 21.2*  PLT 43* 55* 63*   Coag's  Recent Labs Lab 10/04/14 1148 10/05/14 0614 10/17/2014 0538 10/07/14 0458  APTT 48* 50* 42* 41*  INR 2.69 3.00 2.25  --     BMET  Recent Labs Lab 10/19/2014 1542 10/28/2014 2338 10/07/14 0730  NA 138 137 137  K 3.8 3.9 3.9  CL 105 104 104  CO2 $Re'28 28 27  'Hot$ BUN 24* 29* 33*  CREATININE ICTERUS AT THIS LEVEL MAY AFFECT RESULT UNABLE TO REPORT DUE TO ICTERUS SEE COMMENTS  GLUCOSE 267* 237* 235*   Electrolytes  Recent Labs Lab 10/05/14 4193  10/15/2014 0538 10/26/2014 0651 10/05/2014 1542 10/05/2014 2338 10/07/14 0458 10/07/14 0730  CALCIUM 9.0  < >  --  9.0 8.9 9.1  --  8.8*  MG 1.6*  --  1.8  --   --   --  1.9  --   PHOS SEE COMMENTS  --   --  SEE COMMENTS  --  UNABLE TO REPORT DUE TO ICTERUS  --  SEE COMMENTS  < > = values in this interval not displayed. Sepsis Markers No results for input(s): LATICACIDVEN, PROCALCITON, O2SATVEN in the last 168 hours. ABG  Recent Labs Lab 10/03/14 1650 10/04/14 0510 10/10/2014 1045  PHART 7.35 7.41 7.44  PCO2ART 48 44 44  PO2ART 289* 59* 66*   Liver Enzymes  Recent Labs Lab 10/04/14 1148  10/05/14 0614  10/25/2014 0140  10/17/2014 1542 10/26/2014 2338 10/07/14 0730  AST 77*  --  60*  --  55*  --   --   --   --   ALT 29  --  24  --  28  --   --   --   --   ALKPHOS 274*  --  266*  --  351*  --   --   --   --   BILITOT 20.9*  --  20.6*  --  23.9*  --   --   --   --   ALBUMIN 3.7  < > 4.1  4.0  < > 4.0  < > 3.6 3.5 3.5  < > = values in this interval not displayed. Cardiac Enzymes No results for input(s): TROPONINI, PROBNP in the last 168 hours. Glucose  Recent Labs Lab 10/23/2014 1013 10/23/2014 1448 10/11/2014 1800 10/27/2014 2222 10/07/14 0323 10/07/14 0631  GLUCAP 225* 254* 259* 249* 214* 205*     Recent Results (from the past 240 hour(s))  Blood culture (routine x 2)     Status: None   Collection Time: 09/09/2014 12:45 AM  Result Value Ref Range Status   Specimen Description BLOOD  Final   Special Requests NONE  Final   Culture  Setup Time   Final    GRAM POSITIVE COCCI IN CHAINS IN BOTH AEROBIC AND ANAEROBIC BOTTLES CRITICAL RESULT CALLED TO, READ  BACK BY AND VERIFIED WITH: PAM CRAWFORD AT 1406 09/05/2014 DV    Culture STREPTOCOCCUS SPECIES  Final   Report Status 10/01/2014 FINAL  Final   Organism ID, Bacteria STREPTOCOCCUS SPECIES  Final      Susceptibility   Streptococcus species - MIC (ETEST)*    ERYTHROMYCIN Value in next row Resistant      RESISTANT6    PENICILLIN Value in next row Intermediate      INTERMEDIATE0.25    CEFTRIAXONE Value in next row Sensitive      SENSITIVE0.047    VANCOMYCIN Value in next row Sensitive      SENSITIVE1.0    LEVOFLOXACIN Value in next row Sensitive      SENSITIVE1.0    * STREPTOCOCCUS SPECIES  Blood culture (routine x 2)     Status: None   Collection Time: 09/04/2014 12:45 AM  Result Value Ref Range Status   Specimen Description BLOOD  Final   Special Requests NONE  Final   Culture  Setup Time   Final    GRAM POSITIVE COCCI IN CHAINS IN BOTH AEROBIC AND ANAEROBIC BOTTLES CRITICAL RESULT CALLED TO, READ BACK BY AND VERIFIED WITH: PAM CRAWFORD AT 1406 09/17/2014 DV    Culture   Final    STREPTOCOCCUS SPECIES REFER TO ACCESSION [id number] COLLECTED ON 09/27/2014 AT 0045 FOR SENSITIVITIES    Report Status 09/30/2014 FINAL  Final  Culture, blood (routine x 2)     Status: None (Preliminary result)   Collection Time: 10/04/14 11:50 AM  Result Value Ref Range Status   Specimen Description BLOOD  Final   Special Requests NONE  Final   Culture NO GROWTH 2 DAYS  Final   Report Status PENDING  Incomplete  Culture, blood (routine x 2)     Status: None (Preliminary result)   Collection Time: 10/04/14 12:00 PM  Result Value Ref Range Status   Specimen Description BLOOD  Final   Special Requests NONE  Final   Culture NO GROWTH 2 DAYS  Final   Report Status PENDING  Incomplete  Culture, respiratory (NON-Expectorated)     Status: None   Collection Time: 10/04/14  2:00 PM  Result Value Ref Range Status   Specimen Description INDUCED SPUTUM  Final   Special Requests Normal  Final   Gram Stain   Final     EXCELLENT SPECIMEN - 90-100% WBCS MANY WBC SEEN MANY YEAST    Culture MODERATE GROWTH CANDIDA ALBICANS  Final   Report Status 10/07/2014 FINAL  Final     Current facility-administered medications:  .  Marland KitchenTPN (CLINIMIX-E) Adult, , Intravenous, Continuous TPN, Flora Lipps, MD, Last Rate: 70 mL/hr at 10/07/14 0800 .  acetaminophen (TYLENOL) tablet 650 mg, 650 mg, Oral, Q6H PRN **OR** acetaminophen (TYLENOL) suppository 650 mg, 650 mg, Rectal, Q6H PRN, Lytle Butte, MD, 650 mg at 10/03/14 1030 .  albuterol (PROVENTIL) (2.5 MG/3ML) 0.083% nebulizer solution 2.5 mg, 2.5 mg, Nebulization, Q6H PRN, Juluis Mire, MD, 2.5 mg at 10/03/14 0816 .  antiseptic oral rinse (CPC / CETYLPYRIDINIUM CHLORIDE 0.05%) solution 7 mL, 7 mL, Mouth Rinse, QID, Erby Pian, MD, 7 mL at 10/07/14 0456 .  chlorhexidine (PERIDEX) 0.12 % solution 15 mL, 15 mL, Mouth Rinse, BID, Herbon London Pepper, MD, 15 mL at 10/07/14 0800 .  clindamycin (CLEOCIN) IVPB 600 mg, 600 mg, Intravenous, 3 times per day, Adrian Prows, MD, 600 mg at 10/07/14 [phone] .  fentaNYL 2545mcg in NS 225mL (81mcg/ml) infusion-PREMIX, 10 mcg/hr, Intravenous,  Continuous, Hillary Bow, MD, Stopped at 10/07/14 0830 .  heparin injection 1,000-6,000 Units, 1,000-6,000 Units, CRRT, PRN, Murlean Iba, MD, 1,000 Units at 10/01/14 1435 .  hydrocortisone sodium succinate (SOLU-CORTEF) 100 MG injection 50 mg, 50 mg, Intravenous, Q12H, Hillary Bow, MD, 50 mg at 10/07/14 0456 .  insulin aspart (novoLOG) injection 0-9 Units, 0-9 Units, Subcutaneous, Q4H, Flora Lipps, MD, 3 Units at 10/07/14 [phone] .  lactulose (CHRONULAC) 10 GM/15ML solution 20 g, 20 g, Oral, BID, Josefine Class, MD, 20 g at 10/07/14 0914 .  levofloxacin (LEVAQUIN) IVPB 500 mg, 500 mg, Intravenous, Q24H, Adrian Prows, MD, 500 mg at 10/20/2014 1718 .  magnesium sulfate IVPB 1 g 100 mL, 1 g, Intravenous, Once, Bettey Costa, MD, 1 g at 10/07/14 0903 .  midodrine (PROAMATINE) tablet 10 mg, 10 mg,  Oral, TID, Lytle Butte, MD, 10 mg at 10/07/14 0913 .  norepinephrine (LEVOPHED) 16 mg in dextrose 5 % 250 mL (0.064 mg/mL) infusion, 0-40 mcg/min, Intravenous, Titrated, Srikar Sudini, MD, Last Rate: 5.6 mL/hr at 10/07/14 0800, 6 mcg/min at 10/07/14 0800 .  pantoprazole (PROTONIX) injection 80 mg, 80 mg, Intravenous, Q12H, Vira Blanco, RPH, 80 mg at 10/07/14 3893 .  polyethylene glycol powder (GLYCOLAX/MIRALAX) container 17 g, 17 g, Oral, Daily PRN, Lytle Butte, MD .  pureflow IV solution for Dialysis, , CRRT, Continuous, Munsoor Lateef, MD, Last Rate: 2,000 mL/hr at 10/17/2014 1642 .  sodium chloride 0.9 % injection 10-40 mL, 10-40 mL, Intracatheter, Q12H, Srikar Sudini, MD, 20 mL at 10/04/14 2315 .  sodium chloride 0.9 % injection 10-40 mL, 10-40 mL, Intracatheter, PRN, Srikar Sudini, MD .  sucralfate (CARAFATE) 1 GM/10ML suspension 1 g, 1 g, Oral, 4 times per day, Josefine Class, MD, 1 g at 10/07/14 [phone] .  vasopressin (PITRESSIN) 40 Units in sodium chloride 0.9 % 250 mL (0.16 Units/mL) infusion, 0.03 Units/min, Intravenous, Continuous, Lytle Butte, MD, Last Rate: 11.3 mL/hr at 10/07/14 0800, 0.03 Units/min at 10/07/14 0800 .  vecuronium (NORCURON) injection 10 mg, 10 mg, Intravenous, Once, Flora Lipps, MD, 10 mg at 10/17/2014 1243  IMAGING    Dg Chest Port 1 View  10/07/2014   CLINICAL DATA:  Respiratory failure/ventilator dependency.  EXAM: PORTABLE CHEST - 1 VIEW  COMPARISON:  10/03/2014  FINDINGS: Endotracheal tube tip 3.1 cm above the carina. Right internal jugular line tip: Upper SVC. Nasogastric tube enters the stomach.  Moderate enlargement of the cardiopericardial silhouette noted with bilateral increased airspace opacities.  Bilateral degenerative glenohumeral arthropathy. Tortuous thoracic aorta.  IMPRESSION: 1. Worsened bilateral airspace opacities. In the context of the patient's moderate enlargement of the cardiopericardial silhouette, the appearance is suspicious for acute  pulmonary edema. 2. The endotracheal tube has been successfully retracted, and the tubes and lines appear satisfactorily positioned.   Electronically Signed   By: Van Clines M.D.   On: 10/07/2014 08:42      Indwelling Urinary Catheter continued, requirement due to   Reason to continue Indwelling Urinary Catheter for strict Intake/Output monitoring for hemodynamic instability   Central Line continued, requirement due to   Reason to continue [employer] Monitoring of central venous pressure or other hemodynamic parameters   Ventilator continued, requirement due to, resp failure    Ventilator Sedation RASS 0 to -2      ASSESSMENT/PLAN   64 yo AAF admitted to ICU for acute septic shock and progressive resp failure with renal failure Patient with underlying Amyloidosis with progressive multiorgan failure   PULMONARY-Respiratory  Failure -continue Full MV support -continue Bronchodilator Therapy -Wean Fio2 and PEEP as tolerated -will perform SAT/SBT when respiratory parameters are met  CARDIOVASCULAR -septic shock-use vasopressors to keep MAP>65 -ABX as prescribed  RENAL-no urine output-at this time, patient not likely to recover kidney function -progressive renal failure from ATN -on CRRT -follow up Nephrology recs  GASTROINTESTINAL-GIB-severe erosive esophagitis -bowel rest for now -liver failure-worsening -follow up [place] -follow H/h s/p transfusion -follow INR s/p FFP  INFECTIOUS- -streptococcus bacteremia from cellulitis -on abx -repeat blood cultures pending -follow up ID recs   ENDOCRINE -follow FSBS  NEUROLOGIC -wean sedation and assess neuro status  NUTRITION-on TPN  I have personally obtained a history, examined the patient, evaluated laboratory and imaging results, formulated the assessment and plan and placed orders.  The Patient requires high complexity decision making for assessment and support, frequent evaluation  and titration of therapies, application of advanced monitoring technologies and extensive interpretation of multiple databases. Critical Care Time devoted to patient care services described in this note is 45 minutes.   Overall, patient is critically ill, prognosis is very poor/guarded. Patient at high risk for cardiac arrest and death.   Palliative care team consulted-recommend DNR status, recommend comfort care measures  Corrin Parker, M.D. [facility] Director Intensive Care Unit   10/07/2014, 9:17 AM

## 2014-10-07 NOTE — Progress Notes (Signed)
Subjective:  Remains critically ill. Still on CRRT. Remains anuric.  Objective:  Vital signs in last 24 hours:  Temp:  [96.4 F (35.8 C)-97.7 F (36.5 C)] 97.6 F (36.4 C) (06/03 0730) Pulse Rate:  [79-91] 81 (06/03 0800) Resp:  [15-24] 21 (06/03 0800) BP: (78-107)/(51-77) 99/63 mmHg (06/03 0800) SpO2:  [85 %-97 %] 94 % (06/03 0800) FiO2 (%):  [40 %] 40 % (06/03 0800) Weight:  [93.7 kg (206 lb 9.1 oz)] 93.7 kg (206 lb 9.1 oz) (06/03 0500)  Weight change: -2.8 kg (-6 lb 2.8 oz) Filed Weights   10/05/14 0500 10/14/2014 0515 10/07/14 0500  Weight: 95.391 kg (210 lb 4.8 oz) 96.5 kg (212 lb 11.9 oz) 93.7 kg (206 lb 9.1 oz)    Intake/Output: I/O last 3 completed shifts: In: 6283.3 [I.V.:2021.3; Blood:981; NG/GT:320; IV [id number]:885] Out: 0277 [Urine:19; Emesis/NG output:600; Other:5701; Stool:1]     Physical Exam: General: Critically ill appearing  HEENT Scleral icterus noted, ETT in place  Neck supple  Pulm/lungs Decreased breath sounds at bases, vent assisted  CVS/Heart S1S2 no rubs  Abdomen:  Soft, non tender, BS present  Extremities: 3+ anasarca  Neurologic: On the vent  Skin: Warm/dry  Access: Temp cath- rt femoral       Basic Metabolic Panel:  Recent Labs Lab 10/03/14 0558  10/04/14 0818  10/04/14 2310 10/05/14 4128  10/19/2014 0140 10/15/2014 0538 10/25/2014 0651 10/30/2014 1542 10/08/2014 2338 10/07/14 0458 10/07/14 0730  NA 137  < >  --   < > 137 139  < > 139  --  137 138 137  --  137  K 3.6  < >  --   < > 4.1 4.0  < > 4.2  --  3.9 3.8 3.9  --  3.9  CL 104  < >  --   < > 103 106  < > 103  --  104 105 104  --  104  CO2 26  < >  --   < > 27 28  < > 29  --  28 28 28   --  27  GLUCOSE 163*  < >  --   < > 219* 201*  < > 238*  --  236* 267* 237*  --  235*  BUN 23*  < >  --   < > 16 17  < > 18  --  19 24* 29*  --  33*  CREATININE 0.75  < >  --   < > 0.39* <0.30*  < > UNABLE TO REPORT DUE TO ICTERUS INTERFERENCE  --  unable to report due to icterus ICTERUS AT THIS  LEVEL MAY AFFECT RESULT UNABLE TO REPORT DUE TO ICTERUS  --  SEE COMMENTS  CALCIUM 8.2*  < >  --   < > 9.0 9.0  < > 9.2  --  9.0 8.9 9.1  --  8.8*  MG 1.5*  --  1.8  --   --  1.6*  --   --  1.8  --   --   --  1.9  --   PHOS SEE COMMENTS  < > ICTERUS AT THIS LEVEL MAY AFFECT RESULT  < > SEE COMMENTS SEE COMMENTS  --   --   --  SEE COMMENTS  --  UNABLE TO REPORT DUE TO ICTERUS  --  SEE COMMENTS  < > = values in this interval not displayed.   CBC:  Recent Labs Lab 10/02/14 1030 10/03/14 0558 10/03/14 1736  10/04/14 1148 10/05/14 0614 10/17/2014 0538 10/07/14 0458  WBC 24.6* 17.2*  --  16.7* 20.3* 21.2* 23.4*  NEUTROABS 21.1*  --   --   --   --   --   --   HGB 5.9* 7.9* 7.9* 7.3* 6.3* 7.1* 6.8*  HCT 18.6* 23.9*  --  21.4* 18.5* 20.7* 21.2*  MCV 88.6 86.7  --  87.8 91.5 87.2 87.9  PLT 68* 47*  --  44* 43* 55* 63*      Microbiology: Results for orders placed or performed during the hospital encounter of 09/27/2014  Blood culture (routine x 2)     Status: None   Collection Time: 09/14/2014 12:45 AM  Result Value Ref Range Status   Specimen Description BLOOD  Final   Special Requests NONE  Final   Culture  Setup Time   Final    GRAM POSITIVE COCCI IN CHAINS IN BOTH AEROBIC AND ANAEROBIC BOTTLES CRITICAL RESULT CALLED TO, READ BACK BY AND VERIFIED WITH: PAM CRAWFORD AT 1406 10/03/2014 DV    Culture STREPTOCOCCUS SPECIES  Final   Report Status 10/01/2014 FINAL  Final   Organism ID, Bacteria STREPTOCOCCUS SPECIES  Final      Susceptibility   Streptococcus species - MIC (ETEST)*    ERYTHROMYCIN Value in next row Resistant      RESISTANT6    PENICILLIN Value in next row Intermediate      INTERMEDIATE0.25    CEFTRIAXONE Value in next row Sensitive      SENSITIVE0.047    VANCOMYCIN Value in next row Sensitive      SENSITIVE1.0    LEVOFLOXACIN Value in next row Sensitive      SENSITIVE1.0    * STREPTOCOCCUS SPECIES  Blood culture (routine x 2)     Status: None   Collection Time:  09/26/2014 12:45 AM  Result Value Ref Range Status   Specimen Description BLOOD  Final   Special Requests NONE  Final   Culture  Setup Time   Final    GRAM POSITIVE COCCI IN CHAINS IN BOTH AEROBIC AND ANAEROBIC BOTTLES CRITICAL RESULT CALLED TO, READ BACK BY AND VERIFIED WITH: PAM CRAWFORD AT 1406 09/08/2014 DV    Culture   Final    STREPTOCOCCUS SPECIES REFER TO ACCESSION [id number] COLLECTED ON 10/03/2014 AT 0045 FOR SENSITIVITIES    Report Status 09/30/2014 FINAL  Final  Culture, blood (routine x 2)     Status: None (Preliminary result)   Collection Time: 10/04/14 11:50 AM  Result Value Ref Range Status   Specimen Description BLOOD  Final   Special Requests NONE  Final   Culture NO GROWTH 2 DAYS  Final   Report Status PENDING  Incomplete  Culture, blood (routine x 2)     Status: None (Preliminary result)   Collection Time: 10/04/14 12:00 PM  Result Value Ref Range Status   Specimen Description BLOOD  Final   Special Requests NONE  Final   Culture NO GROWTH 2 DAYS  Final   Report Status PENDING  Incomplete  Culture, respiratory (NON-Expectorated)     Status: None   Collection Time: 10/04/14  2:00 PM  Result Value Ref Range Status   Specimen Description INDUCED SPUTUM  Final   Special Requests Normal  Final   Gram Stain   Final    EXCELLENT SPECIMEN - 90-100% WBCS MANY WBC SEEN MANY YEAST    Culture MODERATE GROWTH CANDIDA ALBICANS  Final   Report Status 10/07/2014 FINAL  Final  Coagulation Studies:  Recent Labs  10/04/14 1148 10/05/14 0614 10/07/2014 0538  LABPROT 28.7* 31.2* 25.0*  INR 2.69 3.00 2.25    Urinalysis: No results for input(s): COLORURINE, LABSPEC, PHURINE, GLUCOSEU, HGBUR, BILIRUBINUR, KETONESUR, PROTEINUR, UROBILINOGEN, NITRITE, LEUKOCYTESUR in the last 72 hours.  Invalid input(s): APPERANCEUR    Imaging: Dg Chest Port 1 View  10/07/2014   CLINICAL DATA:  Respiratory failure/ventilator dependency.  EXAM: PORTABLE CHEST - 1 VIEW  COMPARISON:  10/03/2014   FINDINGS: Endotracheal tube tip 3.1 cm above the carina. Right internal jugular line tip: Upper SVC. Nasogastric tube enters the stomach.  Moderate enlargement of the cardiopericardial silhouette noted with bilateral increased airspace opacities.  Bilateral degenerative glenohumeral arthropathy. Tortuous thoracic aorta.  IMPRESSION: 1. Worsened bilateral airspace opacities. In the context of the patient's moderate enlargement of the cardiopericardial silhouette, the appearance is suspicious for acute pulmonary edema. 2. The endotracheal tube has been successfully retracted, and the tubes and lines appear satisfactorily positioned.   Electronically Signed   By: Van Clines M.D.   On: 10/07/2014 08:42     Medications:   . Marland KitchenTPN (CLINIMIX-E) Adult 70 mL/hr at 10/07/14 0800  . fentaNYL infusion INTRAVENOUS Stopped (10/07/14 0830)  . norepinephrine (LEVOPHED) Adult infusion 6 mcg/min (10/07/14 0800)  . pureflow 2,000 mL/hr at 11/01/2014 1642  . vasopressin (PITRESSIN) infusion - *FOR SHOCK* 0.03 Units/min (10/07/14 0800)   . antiseptic oral rinse  7 mL Mouth Rinse QID  . chlorhexidine  15 mL Mouth Rinse BID  . clindamycin (CLEOCIN) IV  600 mg Intravenous 3 times per day  . hydrocortisone sod succinate (SOLU-CORTEF) inj  50 mg Intravenous Q12H  . insulin aspart  0-9 Units Subcutaneous Q4H  . lactulose  20 g Oral BID  . levofloxacin (LEVAQUIN) IV  500 mg Intravenous Q24H  . magnesium sulfate 1 - 4 g bolus IVPB  1 g Intravenous Once  . midodrine  10 mg Oral TID  . pantoprazole (PROTONIX) IV  80 mg Intravenous Q12H  . sodium chloride  10-40 mL Intracatheter Q12H  . sucralfate  1 g Oral 4 times per day  . vecuronium  10 mg Intravenous Once   acetaminophen **OR** acetaminophen, albuterol, heparin, polyethylene glycol powder, sodium chloride  Assessment/ Plan:   64 y.o. African [place] female with diabetes mellitus type 2, hypertension, morbid obesity, osteoarthritis, status post right knee  surgery, nephrotic syndrome with edema, hypoalbuminemia, renal insufficiency and proteinuria.  1. ARF with CKD st 3 . Baseline cr 1.3. (08/2014)  2. Nephrotic Syndrome with proteinuria: Renal Amyloidosis.  3. Generalized Edema 4. Hypoalbuminemia 5. Right groin cellulitis /sepsis- strep species  6. Diabetes Mellitus type II 7. Hypotension  Plan: Patient remains essentially anuric at this point in time. She has tolerated CRRT well however. Family has not made a decision regarding her overall disposition at this time. Therefore we will continue the patient on CRRT with the current parameters. Palliative care is involved in the case and is having ongoing discussions. The patient's kidney disease is likely to progress to end-stage renal disease. Overall prognosis quite poor.  LOS: 9 [name] 6/3/20169:30 AM

## 2014-10-07 NOTE — Consult Note (Signed)
Palliative Medicine Inpatient Consult Follow Up Note   Name: [name] Date: 10/07/2014 MRN: [id number]  DOB: June 06, 1950  Referring Physician: Bettey Costa, MD  Palliative Care consult requested for this 64 y.o. female for goals of medical therapy in patient with amyloidosis (markers - for multiple myeloma), CKD stage III, T2DM, HTN admitted with sepsis  [name] is intubated, sedation off, eyes open. ? Attempts to follow commands. Family not present.   REVIEW OF SYSTEMS:  Patient is not able to provide ROS  CODE STATUS: Full code   PAST MEDICAL HISTORY: Past Medical History  Diagnosis Date  . HTN (hypertension)   . Diabetes mellitus type II   . Obesity   . Arthritis   . Chronic kidney disease   . Amyloidosis     PAST SURGICAL HISTORY:  Past Surgical History  Procedure Laterality Date  . Total abdominal hysterectomy  10/1999    fibroids  . Combined abdominoplasty and liposuction  07/2000  . Total knee arthroplasty Right 09/28/2013    Procedure: RIGHT TOTAL KNEE ARTHROPLASTY;  Surgeon: Mauri Pole, MD;  Location: [facility];  Service: Orthopedics;  Laterality: Right;    Vital Signs: BP 99/63 mmHg  Pulse 81  Temp(Src) 97.6 F (36.4 C) (Oral)  Resp 21  Ht 5' 1" (1.549 m)  Wt 93.7 kg (206 lb 9.1 oz)  BMI 39.05 kg/m2  SpO2 94% Filed Weights   10/05/14 0500 11/02/2014 0515 10/07/14 0500  Weight: 95.391 kg (210 lb 4.8 oz) 96.5 kg (212 lb 11.9 oz) 93.7 kg (206 lb 9.1 oz)    Estimated body mass index is 39.05 kg/(m^2) as calculated from the following:   Height as of this encounter: 5' 1" (1.549 m).   Weight as of this encounter: 93.7 kg (206 lb 9.1 oz).  PHYSICAL EXAM: General: Critically ill appearing HEENT: ETT in place, scleral icterus Neck: Trachea midline  Cardiovascular: regular rate and rhythm Pulmonary/Chest: clear ant fields Abdominal: Soft, nontender, hypoactive bowel sounds GU: Foley present, anuric Extremities: 3 + pitting edema BLE's and  BUE's Neurological: awake, ? Follows simple commands, furrows brow, moves extremities Skin: R flank wound with gauze, CDI Psychiatric: awake, calm  LABS: CBC:    Component Value Date/Time   WBC 23.4* 10/07/2014 0458   WBC 9.2 08/22/2014 1712   HGB 6.8* 10/07/2014 0458   HGB 12.3 08/22/2014 1712   HCT 21.2* 10/07/2014 0458   HCT 38.3 08/22/2014 1712   PLT 63* 10/07/2014 0458   PLT 503* 08/22/2014 1712   MCV 87.9 10/07/2014 0458   MCV 87 08/22/2014 1712   NEUTROABS 21.1* 10/02/2014 1030   NEUTROABS 4.0 08/22/2014 1712   LYMPHSABS 1.5 10/02/2014 1030   LYMPHSABS 4.2* 08/22/2014 1712   MONOABS 2.0* 10/02/2014 1030   MONOABS 0.9 08/22/2014 1712   EOSABS 0.0 10/02/2014 1030   EOSABS 0.0 08/22/2014 1712   BASOSABS 0.0 10/02/2014 1030   BASOSABS 0.1 08/22/2014 1712   Comprehensive Metabolic Panel:    Component Value Date/Time   NA 137 10/07/2014 0730   NA 136 08/22/2014 1712   K 3.9 10/07/2014 0730   K 2.6* 08/22/2014 1712   CL 104 10/07/2014 0730   CL 92* 08/22/2014 1712   CO2 27 10/07/2014 0730   CO2 35* 08/22/2014 1712   BUN 33* 10/07/2014 0730   BUN 29* 08/22/2014 1712   CREATININE SEE COMMENTS 10/07/2014 0730   CREATININE 1.78* 08/22/2014 1712   GLUCOSE 235* 10/07/2014 0730   GLUCOSE 167* 08/22/2014 1712  CALCIUM 8.8* 10/07/2014 0730   CALCIUM 8.2* 08/22/2014 1712   AST 55* 10/19/2014 0140   AST 40 08/22/2014 1712   ALT 28 11/01/2014 0140   ALT 22 08/22/2014 1712   ALKPHOS 351* 10/15/2014 0140   ALKPHOS 294* 08/22/2014 1712   BILITOT 23.9* 10/14/2014 0140   PROT 7.6 10/08/2014 0140   PROT 5.0* 08/22/2014 1712   ALBUMIN 3.5 10/07/2014 0730   ALBUMIN 1.7* 08/22/2014 1712    IMPRESSION: [name] is a 64 y.o. female with amyloidosis (markers - for multiple myeloma), CKD stage III, T2DM, HTN, h/o uterine fibroids, and s/p total abdominal hysterectomy. Pt presented to ER from home with AMS.Workup significant for lactic acidosis, severe hypoalbuminemia, and  ARF requiring CRRT. Hospital course complicated by VDRF.   Pt off sedation. RT working toward SBT. Continues on CRRT, still on pressors.   Will speak with family when they are available.    PLAN: As above   More than 50% of the visit was spent in counseling/coordination of care: YES  Time spent: 35 minutes

## 2014-10-07 NOTE — Progress Notes (Signed)
Date of Admission:  10/03/2014     ID: [name] is a 64 y.o. female with  [name] and anasarca admitted with sepsis from cellulitis on thigh.  [place] with strep species  Active Problems:   Amyloidosis   Septic shock   Leaking central line catheter   Acute respiratory failure with hypoxia   Subjective: Remains intubated, on pressors - 2 and lowering dose. More alert  She had egd with severe esophagitis - got prbc   Medications:  . antiseptic oral rinse  7 mL Mouth Rinse QID  . budesonide (PULMICORT) nebulizer solution  0.5 mg Nebulization BID  . chlorhexidine  15 mL Mouth Rinse BID  . clindamycin (CLEOCIN) IV  600 mg Intravenous 3 times per day  . feeding supplement (VITAL HIGH PROTEIN)  1,000 mL Per Tube Q24H  . free water  25 mL Per Tube Q4H  . ipratropium-albuterol  3 mL Nebulization Q4H  . lactulose  20 g Oral BID  . levofloxacin (LEVAQUIN) IV  500 mg Intravenous Q24H  . midodrine  10 mg Oral TID  . pantoprazole (PROTONIX) IV  40 mg Intravenous Q12H  . sodium chloride  10-40 mL Intracatheter Q12H  . sucralfate  1 g Oral 4 times per day  . vecuronium  10 mg Intravenous Once    Objective: Vital signs in last 24 hours: Temp:  [97.4 F (36.3 C)-97.7 F (36.5 C)] 97.4 F (36.3 C) (06/03 1400) Pulse Rate:  [79-88] 81 (06/03 1400) Resp:  [13-24] 15 (06/03 1400) BP: (78-107)/(51-77) 96/66 mmHg (06/03 1400) SpO2:  [85 %-96 %] 96 % (06/03 1400) FiO2 (%):  [30 %-40 %] 40 % (06/03 1400) Weight:  [93.7 kg (206 lb 9.1 oz)] 93.7 kg (206 lb 9.1 oz) (06/03 0500)  GENERAL: intubated, critically ill appearing EYES: Pupils equal, round, reactive to light and accommodation. No scleral icterus. Extraocular muscles intact. HEENT: Head atraumatic, normocephalic. Oropharynx and nasopharynx clear. NECK: Supple, no jugular venous distention. No thyroid enlargement, no tenderness. Right IJ TL. LUNGS: Normal breath sounds bilaterally, no wheezing, rales, rhonchi. No use of accessory  muscles of respiration. CARDIOVASCULAR: tachy  S1, S2 normal. 2/6 sm ABDOMEN: Soft, nontender, nondistended. Bowel sounds present. No organomegaly or mass. EXTREMITIES: No cyanosis, clubbing. Anasarca. Superficial ulcer right lateral thigh.  NEUROLOGIC: sedated PSYCHIATRIC: The patient is sedated SKIN: decreased erythema over thigh , denuded bullae covered, smaller bullae with clear fluid  Lab Results  Recent Labs  10/08/2014 0538  10/09/2014 2338 10/07/14 0458 10/07/14 0730  WBC 21.2*  --   --  23.4*  --   HGB 7.1*  --   --  6.8*  --   HCT 20.7*  --   --  21.2*  --   NA  --   < > 137  --  137  K  --   < > 3.9  --  3.9  CL  --   < > 104  --  104  CO2  --   < > 28  --  27  BUN  --   < > 29*  --  33*  CREATININE  --   < > UNABLE TO REPORT DUE TO ICTERUS  --  SEE COMMENTS  < > = values in this interval not displayed. Liver Panel  Recent Labs  10/05/14 0614  10/08/2014 0140  10/09/2014 2338 10/07/14 0730  PROT 7.1  --  7.6  --   --   --   ALBUMIN 4.1  4.0  < >  4.0  < > 3.5 3.5  AST 60*  --  55*  --   --   --   ALT 24  --  28  --   --   --   ALKPHOS 266*  --  351*  --   --   --   BILITOT 20.6*  --  23.9*  --   --  30.0*  BILIDIR 15.5*  --   --   --   --   --   IBILI 5.1*  --   --   --   --   --   < > = values in this interval not displayed.  Microbiology: Results for orders placed or performed during the hospital encounter of 09/30/2014  Blood culture (routine x 2)     Status: None   Collection Time: 09/15/2014 12:45 AM  Result Value Ref Range Status   Specimen Description BLOOD  Final   Special Requests NONE  Final   Culture  Setup Time   Final    GRAM POSITIVE COCCI IN CHAINS IN BOTH AEROBIC AND ANAEROBIC BOTTLES CRITICAL RESULT CALLED TO, READ BACK BY AND VERIFIED WITH: PAM CRAWFORD AT 1406 10/02/2014 DV    Culture STREPTOCOCCUS SPECIES  Final   Report Status 10/01/2014 FINAL  Final   Organism ID, Bacteria STREPTOCOCCUS SPECIES  Final      Susceptibility   Streptococcus  species - MIC (ETEST)*    ERYTHROMYCIN Value in next row Resistant      RESISTANT6    PENICILLIN Value in next row Intermediate      INTERMEDIATE0.25    CEFTRIAXONE Value in next row Sensitive      SENSITIVE0.047    VANCOMYCIN Value in next row Sensitive      SENSITIVE1.0    LEVOFLOXACIN Value in next row Sensitive      SENSITIVE1.0    * STREPTOCOCCUS SPECIES  Blood culture (routine x 2)     Status: None   Collection Time: 10/02/2014 12:45 AM  Result Value Ref Range Status   Specimen Description BLOOD  Final   Special Requests NONE  Final   Culture  Setup Time   Final    GRAM POSITIVE COCCI IN CHAINS IN BOTH AEROBIC AND ANAEROBIC BOTTLES CRITICAL RESULT CALLED TO, READ BACK BY AND VERIFIED WITH: PAM CRAWFORD AT 1406 09/27/2014 DV    Culture   Final    STREPTOCOCCUS SPECIES REFER TO ACCESSION [id number] COLLECTED ON 09/06/2014 AT 0045 FOR SENSITIVITIES    Report Status 09/30/2014 FINAL  Final  Culture, blood (routine x 2)     Status: None (Preliminary result)   Collection Time: 10/04/14 11:50 AM  Result Value Ref Range Status   Specimen Description BLOOD  Final   Special Requests NONE  Final   Culture NO GROWTH 3 DAYS  Final   Report Status PENDING  Incomplete  Culture, blood (routine x 2)     Status: None (Preliminary result)   Collection Time: 10/04/14 12:00 PM  Result Value Ref Range Status   Specimen Description BLOOD  Final   Special Requests NONE  Final   Culture NO GROWTH 3 DAYS  Final   Report Status PENDING  Incomplete  Culture, respiratory (NON-Expectorated)     Status: None   Collection Time: 10/04/14  2:00 PM  Result Value Ref Range Status   Specimen Description INDUCED SPUTUM  Final   Special Requests Normal  Final   Gram Stain   Final    EXCELLENT  SPECIMEN - 90-100% WBCS MANY WBC SEEN MANY YEAST    Culture MODERATE GROWTH CANDIDA ALBICANS  Final   Report Status 10/07/2014 FINAL  Final    Studies/Results: Dg Chest Port 1 View  10/07/2014   CLINICAL DATA:   Respiratory failure/ventilator dependency.  EXAM: PORTABLE CHEST - 1 VIEW  COMPARISON:  10/03/2014  FINDINGS: Endotracheal tube tip 3.1 cm above the carina. Right internal jugular line tip: Upper SVC. Nasogastric tube enters the stomach.  Moderate enlargement of the cardiopericardial silhouette noted with bilateral increased airspace opacities.  Bilateral degenerative glenohumeral arthropathy. Tortuous thoracic aorta.  IMPRESSION: 1. Worsened bilateral airspace opacities. In the context of the patient's moderate enlargement of the cardiopericardial silhouette, the appearance is suspicious for acute pulmonary edema. 2. The endotracheal tube has been successfully retracted, and the tubes and lines appear satisfactorily positioned.   Electronically Signed   By: Van Clines M.D.   On: 10/07/2014 08:42   Echo 09/29/14 - Procedure narrative: Transthoracic echocardiography. Image quality was poor. No apical views. - Left ventricle: The cavity size was normal. There was moderate concentric hypertrophy. Systolic function was normal. The estimated ejection fraction was in the range of 60% to 65%. Wall motion was normal; there were no regional wall motion abnormalities. The study is not technically sufficient to allow evaluation of LV diastolic function. Impressions: - There was no evidence of a vegetation.  CT abd 5/28  IMPRESSION: 1. Bilateral pleural effusions, ascites and anasarca is identified consistent with fluid overload state. 2. Subcutaneous fat stranding and skin thickening may be related to anasarca or cellulitis. 3. No fluid collections identified. 4. Left lung opacities may reflect pneumonia and/r or areas of asymmetric edema.  Assessment/Plan: [name] is a 64 y.o. 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  Now also with multiorgan failure, elevated LFTS, possible aspiration pna.  Decreasing hemoglobin TTE  negative.  CT abd pelvis did not show any significant biliary pathology but possible sludge. Fevers resolved and wbc down   Recommendations Changed ceftriaxone to levofloxacin (6/1) as ceftriaxone may be contributing to the elevated LFTs (doubt the main cause though)  - this will  cover the streptococcal bacteremia Cont clinda for toxin production and possible aspiration FU BCX to document clearance has been  neg from 5/31 Sputum cx with yeast but unlikely to need treatment Consider TEE to evaluate for endocarditis given that had likely  viridans strep bacteremia- if 5/31 cx turns positive will definitely need TEE but given elevated coags, low plts, severe esophagitis hesitant to do at this point  [facility], [place]   10/07/2014, 2:46 PM

## 2014-10-07 NOTE — Progress Notes (Signed)
Inpatient Diabetes Program Recommendations  AACE/ADA: New Consensus Statement on Inpatient Glycemic Control (2013)  Target Ranges:  Prepandial:   less than 140 mg/dL      Peak postprandial:   less than 180 mg/dL (1-2 hours)      Critically ill patients:  140 - 180 mg/dL   Results for [name], [name] (MRN [id number]) as of 10/07/2014 08:39  Ref. Range 10/05/2014 02:59 10/07/2014 06:30 10/26/2014 10:13 10/22/2014 14:48 10/05/2014 18:00 10/07/2014 22:22 10/07/2014 03:23 10/07/2014 06:31  Glucose-Capillary Latest Ref Range: 65-99 mg/dL 247 (H) 221 (H) 225 (H) 254 (H) 259 (H) 249 (H) 214 (H) 205 (H)   Current orders for Inpatient glycemic control: Novolog 0-9 units Q4H  Inpatient Diabetes Program Recommendations Insulin - Basal: Glucose ranged from 205-259 mg/dl over the past 24 hours. Please consider ordering low dose basal insulin; recommend ordering Lantus 10 units Q24H starting now (based on 93.7 kg x 0.1 units). Please note that if Lantus is ordered, it will need to be re-evaluated if TPN is discontinued. Correction (SSI): Please consider discontinuing current Glycemic Control order set and order ICU Glycemic Control order set which will allow transition to IV insulin if needed to improve glycemic control.   Thanks, Barnie Alderman, RN, MSN, CCRN, CDE Diabetes Coordinator Inpatient Diabetes Program [phone] (Team Pager from [place] to [place]) [phone] ([facility] office) [phone] [facility] office) [phone] [facility] office)

## 2014-10-07 NOTE — Plan of Care (Signed)
Problem: Phase I Progression Outcomes Goal: Uremic symptoms managed Outcome: Not Progressing CRRT continues. Anuric Goal: OOB as tolerated unless otherwise ordered Outcome: Not Progressing Bedrest with continuous lateral rotation.   PROM. AROMX4 is very weak Goal: Hemodynamically stable Outcome: Not Progressing Minimal wean in pressors today Goal: Pt./family verbalizes plan of care Outcome: Progressing Daughter in-law and son spoke with palliative care today.  Wish to continue to press forward with full care.  Family feels this is what pt would want  Problem: Phase I Progression Outcomes Goal: VTE prophylaxis Outcome: Progressing PT/INR ptt elevated Goal: GIProphysixis Outcome: Progressing Gi output golden green Goal: Oral Care per Protocol Outcome: Progressing tongue a little bloody

## 2014-10-07 NOTE — Progress Notes (Signed)
Nothing to add from GI standpoint at this time.    For the oozing esophagitis:  Cont IV PPI and carafate.  For the liver failure: cont to treat underlying sepsis.  Titrate lactulose to two stools daily.  Monitor INR and electrolytes.    Multi-organ failure.  Poor prognosis.   Please call with questions.

## 2014-10-07 NOTE — Progress Notes (Signed)
[place] at [place] NAME: [name]    MR#:  [id number]  DATE OF BIRTH:  18-Mar-1951  SUBJECTIVE:  CHIEF COMPLAINT:   Chief Complaint  Patient presents with  . Altered Mental Status    Critically ill. Plan is for EGD today  REVIEW OF SYSTEMS:    Review of Systems  Unable to perform ROS: intubated    Unable to give history due to encephalopathy patient intubated  EGD showed SEVERE esophagitis  DRUG ALLERGIES:  No Known Allergies  VITALS:  Blood pressure 100/73, pulse 80, temperature 97.5 F (36.4 C), temperature source Oral, resp. rate 15, height 5\' 1"  (1.549 m), weight 93.7 kg (206 lb 9.1 oz), SpO2 95 %.  PHYSICAL EXAMINATION:   Physical Exam  GENERAL:  64 y.o.-year-old patient lying in the bed critically ill EYES: Pupils equal, round, reactive to light and accommodation. + scleral icterus.  HEENT: Head atraumatic, normocephalic.Intubated NECK:  Supple, no jugular venous distention. No thyroid enlargement, no tenderness. Right IJ  LUNGS: coarse breath sounds, poor air movement, vent CARDIOVASCULAR: S1, S2 normal. No murmurs,  + rub, - gallops. ABDOMEN: Soft, nontender, nondistended. Bowel sounds decreased. No organomegaly or mass. EXTREMITIES: No cyanosis, clubbing. Anasarca.  NEUROLOGIC:  Opens eyes,  Does not follow commands PSYCHIATRIC: The patient is drowzy. SKIN:  Erythema with warmth right abdominal wall, groin,right lateral thigh. Superficial ulcer right thigh.  LABORATORY PANEL:   CBC  Recent Labs Lab 10/07/14 0458  WBC 23.4*  HGB 6.8*  HCT 21.2*  PLT 63*   ------------------------------------------------------------------------------------------------------------------  Chemistries   Recent Labs Lab 10/30/2014 0140  10/07/14 0458 10/07/14 0730  NA 139  < >  --  137  K 4.2  < >  --  3.9  CL 103  < >  --  104  CO2 29  < >  --  27  GLUCOSE 238*  < >  --  235*  BUN 18  < >  --  33*   CREATININE UNABLE TO REPORT DUE TO ICTERUS INTERFERENCE  < >  --  SEE COMMENTS  CALCIUM 9.2  < >  --  8.8*  MG  --   < > 1.9  --   AST 55*  --   --   --   ALT 28  --   --   --   ALKPHOS 351*  --   --   --   BILITOT 23.9*  --   --  30.0*  < > = values in this interval not displayed. ------------------------------------------------------------------------------------------------------------------  Cardiac Enzymes No results for input(s): TROPONINI in the last 168 hours. ------------------------------------------------------------------------------------------------------------------  RADIOLOGY:  Dg Chest Port 1 View  10/07/2014   IMPRESSION: 1. Worsened bilateral airspace opacities. In the context of the patient's moderate enlargement of the cardiopericardial silhouette, the appearance is suspicious for acute pulmonary edema. 2. The endotracheal tube has been successfully retracted, and the tubes and lines appear satisfactorily positioned.   Electronically Signed   By: [name] M.D.   On: 10/07/2014 08:42     ASSESSMENT AND PLAN:   64 year old African American female history of nephrotic syndrome, amyloidosis, essential hypertension presenting on 5/25 with altered mental status/confusion after apparently falling out of bed. Admitted for septic shock  * Septic shock with right flank/thigh cellulitis with streptococcus bacteremia - ID following - Changed to Levaquin and clindamycin  - Echo- No vegetations. May need TEE - Repeat blood cultures negative to date -  continue stress dose steroids, pressors - Checked IgG level and are low as expected. [name] discussed with [name] with Oncology regarding replacement. Receiving albumen every 6 hours  * Acute renal failure over CKD3 due to ATN. From septic shock in the setting of amyloidosis On CRRT. Appreciate nephrology help. Severe hypoalbuminemia. Getting IV albumin. Will likely progress to ESRD.  * Acute liver failure-  Due to sepsis Ct abd showed no biliary dilation or obstruction Severe hyperbilirubinemia at 25. Gastroenterology following  lactulose  * Elevated INR Due to liver failure. DIC unlikely per oncology, recheck smear, fibrinogen today 4 units FFP given 6/1 minimal improvement in INR  * Acute encephalopathy - Due to acute illness. CT head nothing acute.  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 Plan was for chemo as OP with Gina. [facility] but now on hold due to acute illness. Oncology following  * Acute anemia over AOCD EGD showed severe esophagitis On protonix 80 mg IV every 12 Transfused 3 units will need another unit this am her hgb has dropped   * Type 2 diabetes, uncomplicated: Hold oral agents, at insulin slide scale every 6 hours Accu-Chek  * Essential hypertension: Held all meds  * Venous thromboembolism prophylactic: Heparin subcutaneous stopped due to high INR and low platelets and GIB  * Nutrition TPN  * Acute respiratory failure  - due to suspected aspiration of gastrointestinal content - Pulmonology following - Full ventilator support - Continued suctioning as significant amount of gastric contents present in the lungs  CODE STATUS: FULL CODE  Appreciate palliative care following.   TOTAL CRITICAL CARE TIME TAKING CARE OF THIS PATIENT: 45 minutes.    [name] M.D on 10/07/2014 at 1:32 PM  Between 7am to 6pm - Pager - [phone]  After 6pm go to [email or web address] - password [email or web address] [facility]  [place] [facility]  Office  [phone]  CC: Primary care physician; [name], MD

## 2014-10-07 NOTE — Progress Notes (Signed)
Nutrition Follow-up  DOCUMENTATION CODES:     INTERVENTION:   (Parenteral nutrition:) Recommend continuing TPN at 57ml/hr at this time to meet 100% of kcals needs and 88% of protein needs. Enteral nutrition: Dr Mortimer Fries wanting to start trophic feeding today. Recommend starting vital high protein at 73ml/hr today.  Provides 480 kcals and 42 g of protein.  Will re-evaluate tube feeding in am. If tolerating recommend goal rate of vital high protein 75ml/hr to provide 1200 kcals and 105 gm of protein (to meet 100% of kcals and protein needs) Nutrition related medication: Planning to start insulin drip for elevated blood glucose per pharmacy recommendations  NUTRITION DIAGNOSIS:  Inadequate oral intake related to inability to eat as evidenced by NPO status, ongoing    GOAL:   (Goal would be for pt to tolerate enteral nutrition within 48-72 hr and for TPN to be discontinued)    MONITOR:   (PN, EN, Electrolyte and renal profile, Anthropometric, Digestive system )  REASON FOR ASSESSMENT:  Consult New TPN/TNA  ASSESSMENT:  Pt in spontaneous mode this am, CRRT continues . Pt s/p EGD yesterday showing severe esophagitis, normal stomach and duodneum.    Electrolyte and Renal Profile:  Recent Labs Lab 10/05/14 0614  10/28/2014 0538 10/05/2014 0651 10/25/2014 1542 10/17/2014 2338 10/07/14 0458 10/07/14 0730  BUN 17  < >  --  19 24* 29*  --  33*  CREATININE <0.30*  < >  --  unable to report due to icterus ICTERUS AT THIS LEVEL MAY AFFECT RESULT UNABLE TO REPORT DUE TO ICTERUS  --  SEE COMMENTS  NA 139  < >  --  137 138 137  --  137  K 4.0  < >  --  3.9 3.8 3.9  --  3.9  MG 1.6*  --  1.8  --   --   --  1.9  --   PHOS SEE COMMENTS  --   --  SEE COMMENTS  --  UNABLE TO REPORT DUE TO ICTERUS  --  SEE COMMENTS  < > = values in this interval not displayed. Nutritional Anemia Profile:  CBC Latest Ref Rng 10/07/2014 10/22/2014 10/05/2014  WBC 3.6 - 11.0 K/uL 23.4(H) 21.2(H) 20.3(H)  Hemoglobin 12.0  - 16.0 g/dL 6.8(L) 7.1(L) 6.3(L)  Hematocrit 35.0 - 47.0 % 21.2(L) 20.7(L) 18.5(L)  Platelets 150 - 440 K/uL 63(L) 55(L) 43(L)    Medications: levophed, vasopressin,   Ouput: noted 280ml OG tube output  Height:  Ht Readings from Last 1 Encounters:  09/12/2014 5\' 1"  (1.549 m)    Weight:  Wt Readings from Last 1 Encounters:  10/07/14 206 lb 9.1 oz (93.7 kg)       Wt Readings from Last 10 Encounters:  10/07/14 206 lb 9.1 oz (93.7 kg)  09/20/14 216 lb 4 oz (98.09 kg)  09/13/14 210 lb 15.7 oz (95.7 kg)  07/06/14 216 lb 12.8 oz (98.34 kg)  06/24/14 216 lb 8 oz (98.204 kg)  06/07/14 209 lb 12.8 oz (95.165 kg)  03/16/14 194 lb 8 oz (88.225 kg)  01/14/14 197 lb 12 oz (89.699 kg)  01/11/14 193 lb 12 oz (87.884 kg)  09/28/13 203 lb (92.08 kg)    BMI:  Body mass index is 39.05 kg/(m^2).  Estimated Nutritional Needs:  Kcal:  (11-14 kcals/d) Using actual wt of 95kg) 1050-1330 kcals/d  Protein:  (2.0-2.5 g/d) Using IbW of 48kg 96-120 g/d  Fluid:  (25-14ml/kg) Using IBW of 48kg 1200-1467ml/d  Skin:  Reviewed, no issues  Diet Order:  Diet NPO time specified .TPN (CLINIMIX-E) Adult .TPN (CLINIMIX-E) Adult  EDUCATION NEEDS:  No education needs identified at this time   Intake/Output Summary (Last 24 hours) at 10/07/14 1055 Last data filed at 10/07/14 1000  Gross per 24 hour  Intake 3732.98 ml  Output   4231 ml  Net -498.02 ml    Last BM:  6/3  HIGH Care Level Salah Burlison B. Zenia Resides, [place], [place] (pager)

## 2014-10-07 NOTE — Consult Note (Signed)
I met with pt's daughter-in-law (son asks that I meet with daughter-in-law as his surrogate). Updated her on pt's current condition. She says that husband remains hopeful that pt will survive this illness with continued aggressive care. I suggested that she discuss code status with son in particular making pt a DNR. Daughter-in-law expresses understanding.

## 2014-10-08 LAB — COMPREHENSIVE METABOLIC PANEL
ALBUMIN: 3 g/dL — AB (ref 3.5–5.0)
ALK PHOS: 702 U/L — AB (ref 38–126)
ALT: 33 U/L (ref 14–54)
AST: 85 U/L — AB (ref 15–41)
Anion gap: 7 (ref 5–15)
BUN: 41 mg/dL — AB (ref 6–20)
CALCIUM: 9.4 mg/dL (ref 8.9–10.3)
CHLORIDE: 103 mmol/L (ref 101–111)
CO2: 27 mmol/L (ref 22–32)
Glucose, Bld: 183 mg/dL — ABNORMAL HIGH (ref 65–99)
Potassium: 4.1 mmol/L (ref 3.5–5.1)
Sodium: 137 mmol/L (ref 135–145)
TOTAL PROTEIN: 6.9 g/dL (ref 6.5–8.1)
Total Bilirubin: 31.4 mg/dL (ref 0.3–1.2)

## 2014-10-08 LAB — RENAL FUNCTION PANEL
ALBUMIN: 2.9 g/dL — AB (ref 3.5–5.0)
Albumin: 3.2 g/dL — ABNORMAL LOW (ref 3.5–5.0)
Anion gap: 7 (ref 5–15)
Anion gap: 9 (ref 5–15)
BUN: 38 mg/dL — ABNORMAL HIGH (ref 6–20)
BUN: 41 mg/dL — AB (ref 6–20)
CALCIUM: 9.1 mg/dL (ref 8.9–10.3)
CALCIUM: 9.3 mg/dL (ref 8.9–10.3)
CO2: 25 mmol/L (ref 22–32)
CO2: 26 mmol/L (ref 22–32)
Chloride: 102 mmol/L (ref 101–111)
Chloride: 104 mmol/L (ref 101–111)
GLUCOSE: 196 mg/dL — AB (ref 65–99)
Glucose, Bld: 218 mg/dL — ABNORMAL HIGH (ref 65–99)
Potassium: 4.1 mmol/L (ref 3.5–5.1)
Potassium: 4.1 mmol/L (ref 3.5–5.1)
SODIUM: 136 mmol/L (ref 135–145)
Sodium: 137 mmol/L (ref 135–145)

## 2014-10-08 LAB — GLUCOSE, CAPILLARY
GLUCOSE-CAPILLARY: 120 mg/dL — AB (ref 65–99)
GLUCOSE-CAPILLARY: 170 mg/dL — AB (ref 65–99)
GLUCOSE-CAPILLARY: 176 mg/dL — AB (ref 65–99)
GLUCOSE-CAPILLARY: 182 mg/dL — AB (ref 65–99)
GLUCOSE-CAPILLARY: 186 mg/dL — AB (ref 65–99)
GLUCOSE-CAPILLARY: 151 mg/dL — AB (ref 65–99)
Glucose-Capillary: 128 mg/dL — ABNORMAL HIGH (ref 65–99)
Glucose-Capillary: 132 mg/dL — ABNORMAL HIGH (ref 65–99)
Glucose-Capillary: 135 mg/dL — ABNORMAL HIGH (ref 65–99)
Glucose-Capillary: 137 mg/dL — ABNORMAL HIGH (ref 65–99)
Glucose-Capillary: 143 mg/dL — ABNORMAL HIGH (ref 65–99)
Glucose-Capillary: 144 mg/dL — ABNORMAL HIGH (ref 65–99)
Glucose-Capillary: 144 mg/dL — ABNORMAL HIGH (ref 65–99)
Glucose-Capillary: 152 mg/dL — ABNORMAL HIGH (ref 65–99)
Glucose-Capillary: 159 mg/dL — ABNORMAL HIGH (ref 65–99)
Glucose-Capillary: 160 mg/dL — ABNORMAL HIGH (ref 65–99)
Glucose-Capillary: 166 mg/dL — ABNORMAL HIGH (ref 65–99)
Glucose-Capillary: 167 mg/dL — ABNORMAL HIGH (ref 65–99)
Glucose-Capillary: 172 mg/dL — ABNORMAL HIGH (ref 65–99)
Glucose-Capillary: 188 mg/dL — ABNORMAL HIGH (ref 65–99)
Glucose-Capillary: 188 mg/dL — ABNORMAL HIGH (ref 65–99)
Glucose-Capillary: 196 mg/dL — ABNORMAL HIGH (ref 65–99)
Glucose-Capillary: 196 mg/dL — ABNORMAL HIGH (ref 65–99)
Glucose-Capillary: 198 mg/dL — ABNORMAL HIGH (ref 65–99)

## 2014-10-08 LAB — TYPE AND SCREEN
ABO/RH(D): A POS
Antibody Screen: NEGATIVE
UNIT DIVISION: 0
UNIT DIVISION: 0

## 2014-10-08 LAB — CBC
HEMATOCRIT: 26.5 % — AB (ref 35.0–47.0)
HEMOGLOBIN: 9 g/dL — AB (ref 12.0–16.0)
MCH: 30.7 pg (ref 26.0–34.0)
MCHC: 34 g/dL (ref 32.0–36.0)
MCV: 90.2 fL (ref 80.0–100.0)
Platelets: 75 10*3/uL — ABNORMAL LOW (ref 150–440)
RBC: 2.94 MIL/uL — AB (ref 3.80–5.20)
RDW: 16.8 % — AB (ref 11.5–14.5)
WBC: 26.5 10*3/uL — AB (ref 3.6–11.0)

## 2014-10-08 LAB — HEPATIC FUNCTION PANEL
ALBUMIN: 3.1 g/dL — AB (ref 3.5–5.0)
ALT: 30 U/L (ref 14–54)
AST: 81 U/L — AB (ref 15–41)
Alkaline Phosphatase: 594 U/L — ABNORMAL HIGH (ref 38–126)
BILIRUBIN DIRECT: 22.6 mg/dL — AB (ref 0.1–0.5)
BILIRUBIN INDIRECT: 8.8 mg/dL — AB (ref 0.3–0.9)
BILIRUBIN TOTAL: 31.4 mg/dL — AB (ref 0.3–1.2)
TOTAL PROTEIN: 6.7 g/dL (ref 6.5–8.1)

## 2014-10-08 LAB — BASIC METABOLIC PANEL
Anion gap: 5 (ref 5–15)
BUN: 39 mg/dL — ABNORMAL HIGH (ref 6–20)
CALCIUM: 9 mg/dL (ref 8.9–10.3)
CO2: 26 mmol/L (ref 22–32)
Chloride: 105 mmol/L (ref 101–111)
GLUCOSE: 146 mg/dL — AB (ref 65–99)
Potassium: 3.8 mmol/L (ref 3.5–5.1)
Sodium: 136 mmol/L (ref 135–145)

## 2014-10-08 LAB — APTT: aPTT: 43 seconds — ABNORMAL HIGH (ref 24–36)

## 2014-10-08 LAB — PHOSPHORUS

## 2014-10-08 LAB — MAGNESIUM: Magnesium: 2 mg/dL (ref 1.7–2.4)

## 2014-10-08 MED ORDER — NOREPINEPHRINE BITARTRATE 1 MG/ML IV SOLN
0.0000 ug/min | INTRAVENOUS | Status: DC
  Administered 2014-10-08: 6 ug/min via INTRAVENOUS
  Administered 2014-10-08: 8 ug/min via INTRAVENOUS
  Administered 2014-10-10: 10 ug/min via INTRAVENOUS
  Administered 2014-10-11: 14 ug/min via INTRAVENOUS
  Administered 2014-10-11: 16 ug/min via INTRAVENOUS
  Administered 2014-10-12: 10 ug/min via INTRAVENOUS
  Administered 2014-10-15: 28 ug/min via INTRAVENOUS
  Administered 2014-10-16: 44 ug/min via INTRAVENOUS
  Administered 2014-10-16: 30 ug/min via INTRAVENOUS
  Administered 2014-10-17: 46 ug/min via INTRAVENOUS
  Administered 2014-10-17: 45 ug/min via INTRAVENOUS
  Administered 2014-10-17: 46 ug/min via INTRAVENOUS
  Administered 2014-10-17: 40 ug/min via INTRAVENOUS
  Administered 2014-10-18: 70 ug/min via INTRAVENOUS
  Administered 2014-10-18: 40 ug/min via INTRAVENOUS
  Filled 2014-10-08 (×23): qty 16

## 2014-10-08 MED ORDER — NOREPINEPHRINE 4 MG/250ML-% IV SOLN: 0.0000 ug/min | INTRAVENOUS | Status: DC

## 2014-10-08 MED ORDER — TRACE MINERALS CR-CU-MN-SE-ZN 10-1000-500-60 MCG/ML IV SOLN
INTRAVENOUS | Status: AC
  Administered 2014-10-08: 18:00:00 via INTRAVENOUS
  Filled 2014-10-08: qty 1680

## 2014-10-08 NOTE — Progress Notes (Signed)
[facility] [place] NAME: [name]    MR#:  [id number]  DATE OF BIRTH:  11-29-1950  SUBJECTIVE:  CHIEF COMPLAINT:   Chief Complaint  Patient presents with  . Altered Mental Status    Critically ill. On 2 pressors. CRRT. Awake but not following commands.  REVIEW OF SYSTEMS:    ROS  Unable to give history due to encephalopathy patient intubated  EGD showed SEVERE esophagitis  DRUG ALLERGIES:  No Known Allergies  VITALS:  Blood pressure 85/61, pulse 93, temperature 97.8 F (36.6 C), temperature source Axillary, resp. rate 14, height 5\' 1"  (1.549 m), weight 91.8 kg (202 lb 6.1 oz), SpO2 92 %.  PHYSICAL EXAMINATION:   Physical Exam  GENERAL:  64 y.o.-year-old patient lying in the bed critically ill EYES: Pupils equal, round, reactive to light and accommodation. + scleral icterus.  HEENT: Head atraumatic, normocephalic.Intubated NECK:  Supple, no jugular venous distention. No thyroid enlargement, no tenderness. Right IJ . Right femoral Dialysis catheter. LUNGS: coarse breath sounds, poor air movement, vent CARDIOVASCULAR: S1, S2 normal. No murmurs,  + rub, - gallops. ABDOMEN: Soft, nontender, nondistended. Bowel sounds decreased. No organomegaly or mass. EXTREMITIES: No cyanosis, clubbing. Anasarca.  NEUROLOGIC:  Opens eyes,  Does not follow commands PSYCHIATRIC: The patient is drowzy. SKIN:  Superficial ulcer right thigh.  LABORATORY PANEL:   CBC  Recent Labs Lab 10/08/14 0437  WBC 26.5*  HGB 9.0*  HCT 26.5*  PLT 75*   ------------------------------------------------------------------------------------------------------------------  Chemistries   Recent Labs Lab 10/22/2014 0140  10/07/14 0730  10/08/14 0437  NA 139  < > 137  < > 136  K 4.2  < > 3.9  < > 3.8  CL 103  < > 104  < > 105  CO2 29  < > 27  < > 26  GLUCOSE 238*  < > 235*  < > 146*  BUN 18  < > 33*  < > 39*  CREATININE UNABLE TO REPORT  DUE TO ICTERUS INTERFERENCE  < > SEE COMMENTS  < > SEE COMMENTS  CALCIUM 9.2  < > 8.8*  < > 9.0  MG  --   < >  --   --  2.0  AST 55*  --   --   --   --   ALT 28  --   --   --   --   ALKPHOS 351*  --   --   --   --   BILITOT 23.9*  --  30.0*  --   --   < > = values in this interval not displayed. ------------------------------------------------------------------------------------------------------------------  Cardiac Enzymes No results for input(s): TROPONINI in the last 168 hours. ------------------------------------------------------------------------------------------------------------------  RADIOLOGY:  Dg Chest Port 1 View  10/07/2014   IMPRESSION: 1. Worsened bilateral airspace opacities. In the context of the patient's moderate enlargement of the cardiopericardial silhouette, the appearance is suspicious for acute pulmonary edema. 2. The endotracheal tube has been successfully retracted, and the tubes and lines appear satisfactorily positioned.   Electronically Signed   By: Van Clines M.D.   On: 10/07/2014 08:42     ASSESSMENT AND PLAN:   64 year old African American female history of nephrotic syndrome, amyloidosis, essential hypertension presenting on 5/25 with altered mental status/confusion after apparently falling out of bed. Admitted for septic shock  * Septic shock with right flank/thigh cellulitis with streptococcus bacteremia - ID following - Changed to Levaquin and clindamycin . Continue  pressors. Off steroids. - Echo- No vegetations. May need TEE if repeat cx positive. But would be high risk for complications. - Repeat blood cultures negative to date  * Acute renal failure over CKD3 due to ATN. From septic shock in the setting of amyloidosis On CRRT. Appreciate nephrology help. Severe hypoalbuminemia. Getting IV albumin. Will likely progress to ESRD.  * Acute liver failure- Due to sepsis Ct abd showed no biliary dilation or obstruction Severe  hyperbilirubinemia > 25. Gastroenterology following  lactulose  * Elevated INR Due to liver failure. DIC unlikely per oncology, recheck smear, fibrinogen today 4 units FFP given 6/1 minimal improvement in INR  * Acute encephalopathy - Due to acute illness. CT head nothing acute.  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 Plan was for chemo as OP with Dr. Grayland Ormond but now on hold due to acute illness.. Oncology following  * Acute anemia over AOCD EGD showed severe esophagitis On protonix 80 mg IV every 12 Transfused 4 units this admission.  * Type 2 diabetes, uncomplicated: Hold oral agents, at insulin slide scale every 6 hours Accu-Chek  * Essential hypertension: Held all meds  * Venous thromboembolism prophylactic: Heparin subcutaneous stopped due to high INR and low platelets and GIB  * Nutrition TFs  * Acute respiratory failure -Full vent support - due to suspected aspiration of gastrointestinal content - Pulmonology following - Full ventilator support - Continued suctioning as significant amount of gastric contents present in the lungs.  CODE STATUS: FULL CODE  Appreciate palliative care following. Very poor prognosis with no improvement inspite of aggressive care. Multi organ failure.   TOTAL CRITICAL CARE TIME TAKING CARE OF THIS PATIENT: 35 minutes.    Hillary Bow R M.D on 10/08/2014 at 8:24 AM  Between 7am to 6pm - Pager - [phone]  After 6pm go to [email or web address] - password [email or web address] [facility]  [facility]  Office  [phone]  CC: Primary care physician; [name], MD

## 2014-10-08 NOTE — Progress Notes (Signed)
[facility] NOTE  Pharmacy Consult for Electrolyte and CRRT medication management Indication: ICU Status, CRRT, TPN   No Known Allergies  Patient Measurements: Height: 5\' 1"  (154.9 cm) Weight: 202 lb 6.1 oz (91.8 kg) IBW/kg (Calculated) : 47.8   Vital Signs: Temp: 97.8 F (36.6 C) (06/04 0900) Temp Source: Axillary (06/04 0900) BP: 85/61 mmHg (06/04 0600) Pulse Rate: 93 (06/04 0600) Intake/Output from previous day: 06/03 0701 - 06/04 0700 In: 3106.6 [I.V.:844.6; Blood:372; NG/GT:200; IV Piggyback:150; TPN:1540] Out: 4530 [Urine:15; Emesis/NG output:470] Intake/Output from this shift: Total I/O In: 351.8 [I.V.:71.8; TPN:280] Out: 339 [Other:339] Vent settings for last 24 hours: Vent Mode:  [-] Spontaneous FiO2 (%):  [35 %-40 %] 35 % Vt Set:  [737 mL] 737 mL PEEP:  [5 cmH20] 5 cmH20 Pressure Support:  [10 cmH20-15 cmH20] 15 cmH20  Labs:  Recent Labs  10/09/2014 0140  11/03/2014 0538  10/26/2014 2338 10/07/14 0458 10/07/14 0730 10/07/14 1120  10/07/14 1927 10/07/14 2147 10/07/14 2346 10/08/14 0437 10/08/14 0658  WBC  --   --  21.2*  --   --  23.4*  --   --   --   --   --   --  26.5*  --   HGB  --   < > 7.1*  --   --  6.8*  --   --   --  8.8*  --   --  9.0*  --   HCT  --   < > 20.7*  --   --  21.2*  --   --   --  25.9*  --   --  26.5*  --   PLT  --   --  55*  --   --  63*  --   --   --   --   --   --  75*  --   APTT  --   < > 42*  --   --  41*  --  41*  --   --   --   --  43*  --   INR  --   --  2.25  --   --   --   --  2.03  --   --   --   --   --   --   CREATININE UNABLE TO REPORT DUE TO ICTERUS INTERFERENCE  --   --   < > UNABLE TO REPORT DUE TO ICTERUS  --  SEE COMMENTS  --   < > SEE COMMENTS SEE COMMENTS SEE COMMENTS SEE COMMENTS  --   MG  --   --  1.8  --   --  1.9  --   --   --   --   --   --  2.0  --   PHOS  --   --   --   < > UNABLE TO REPORT DUE TO ICTERUS  --  SEE COMMENTS  --   --   --   --   --  SEE COMMENT  --   ALBUMIN 4.0  --   --    < > 3.5  --  3.5  --   < > 3.4* 3.3* 3.2*  --  3.1*  PROT 7.6  --   --   --   --   --   --   --   --   --   --   --   --  6.7  AST 55*  --   --   --   --   --   --   --   --   --   --   --   --  81*  ALT 28  --   --   --   --   --   --   --   --   --   --   --   --  30  ALKPHOS 351*  --   --   --   --   --   --   --   --   --   --   --   --  594*  BILITOT 23.9*  --   --   --   --   --  30.0*  --   --   --   --   --   --  31.4*  BILIDIR  --   --   --   --   --   --   --   --   --   --   --   --   --  22.6*  IBILI  --   --   --   --   --   --   --   --   --   --   --   --   --  8.8*  < > = values in this interval not displayed. CrCl cannot be calculated (Patient has no serum creatinine result on file.).   Recent Labs  10/08/14 0919 10/08/14 1004 10/08/14 1108  GLUCAP 167* 160* 172*    Microbiology: Recent Results (from the past 720 hour(s))  Blood culture (routine x 2)     Status: None   Collection Time: 09/29/2014 12:45 AM  Result Value Ref Range Status   Specimen Description BLOOD  Final   Special Requests NONE  Final   Culture  Setup Time   Final    GRAM POSITIVE COCCI IN CHAINS IN BOTH AEROBIC AND ANAEROBIC BOTTLES CRITICAL RESULT CALLED TO, READ BACK BY AND VERIFIED WITH: PAM CRAWFORD AT 1406 09/26/2014 DV    Culture STREPTOCOCCUS SPECIES  Final   Report Status 10/01/2014 FINAL  Final   Organism ID, Bacteria STREPTOCOCCUS SPECIES  Final      Susceptibility   Streptococcus species - MIC (ETEST)*    ERYTHROMYCIN Value in next row Resistant      RESISTANT6    PENICILLIN Value in next row Intermediate      INTERMEDIATE0.25    CEFTRIAXONE Value in next row Sensitive      SENSITIVE0.047    VANCOMYCIN Value in next row Sensitive      SENSITIVE1.0    LEVOFLOXACIN Value in next row Sensitive      SENSITIVE1.0    * STREPTOCOCCUS SPECIES  Blood culture (routine x 2)     Status: None   Collection Time: 09/30/2014 12:45 AM  Result Value Ref Range Status   Specimen Description BLOOD   Final   Special Requests NONE  Final   Culture  Setup Time   Final    GRAM POSITIVE COCCI IN CHAINS IN BOTH AEROBIC AND ANAEROBIC BOTTLES CRITICAL RESULT CALLED TO, READ BACK BY AND VERIFIED WITH: PAM CRAWFORD AT 1406 09/27/2014 DV    Culture   Final    STREPTOCOCCUS SPECIES REFER TO ACCESSION [id number] COLLECTED ON 09/19/2014 AT 0045 FOR SENSITIVITIES    Report Status 09/30/2014 FINAL  Final  Culture,  blood (routine x 2)     Status: None (Preliminary result)   Collection Time: 10/04/14 11:50 AM  Result Value Ref Range Status   Specimen Description BLOOD  Final   Special Requests NONE  Final   Culture NO GROWTH 4 DAYS  Final   Report Status PENDING  Incomplete  Culture, blood (routine x 2)     Status: None (Preliminary result)   Collection Time: 10/04/14 12:00 PM  Result Value Ref Range Status   Specimen Description BLOOD  Final   Special Requests NONE  Final   Culture NO GROWTH 4 DAYS  Final   Report Status PENDING  Incomplete  Culture, respiratory (NON-Expectorated)     Status: None   Collection Time: 10/04/14  2:00 PM  Result Value Ref Range Status   Specimen Description INDUCED SPUTUM  Final   Special Requests Normal  Final   Gram Stain   Final    EXCELLENT SPECIMEN - 90-100% WBCS MANY WBC SEEN MANY YEAST    Culture MODERATE GROWTH CANDIDA ALBICANS  Final   Report Status 10/07/2014 FINAL  Final    Medications:  Scheduled:  . antiseptic oral rinse  7 mL Mouth Rinse QID  . budesonide (PULMICORT) nebulizer solution  0.5 mg Nebulization BID  . chlorhexidine  15 mL Mouth Rinse BID  . clindamycin (CLEOCIN) IV  600 mg Intravenous 3 times per day  . ipratropium-albuterol  3 mL Nebulization Q4H  . lactulose  20 g Oral BID  . levofloxacin (LEVAQUIN) IV  500 mg Intravenous Q24H  . midodrine  10 mg Oral TID  . pantoprazole (PROTONIX) IV  40 mg Intravenous Q12H  . sodium chloride  10-40 mL Intracatheter Q12H  . sucralfate  1 g Oral 4 times per day  . vecuronium  10 mg Intravenous  Once   Infusions:  . Marland KitchenTPN (CLINIMIX-E) Adult 70 mL/hr at 10/08/14 1100  . Marland KitchenTPN (CLINIMIX-E) Adult    . fentaNYL infusion INTRAVENOUS 400 mcg/hr (10/08/14 1100)  . insulin (NOVOLIN-R) infusion 2.2 mL/hr at 10/08/14 1100  . norepinephrine (LEVOPHED) Adult infusion 6 mcg/min (10/08/14 1100)  . pureflow 2,000 mL/hr at 10/08/14 0734  . vasopressin (PITRESSIN) infusion - *FOR SHOCK* 0.03 Units/min (10/08/14 1100)   PRN: acetaminophen **OR** acetaminophen, albuterol, heparin, polyethylene glycol powder, sodium chloride  Assessment: 64 yo morbidly obese female with chronic trach admitted with respiratory failure requiring ventilator support.    Plan:   1. Electrolytes/TPN: Eectolytes are WNL. Will obtain follow-up labs with am labs. Patient initiated on insulin drip. Will need to ensure that MVI and Trace are added to TPNs.   2. CRRT Medication adjustment: no further adjustments warranted at this time.   Pharmacy will continue to monitor and adjust per consult.     Morry Veiga K, RPH 10/08/2014,11:58 AM

## 2014-10-08 NOTE — Consult Note (Signed)
PULMONARY / CRITICAL CARE MEDICINE   Name: [name] MRN: [id number] DOB: 09-Jul-1950    ADMISSION DATE:  09/14/2014    CHIEF COMPLAINT:   resp failure     SIGNIFICANT EVENTS    Patient remains intubated,sedated. On full vent support, on multiple vasopressors,  on CRRT, patient with liver,renal and resp failure, prognosis is very poor  Plan for sedation vacation, kidney function very poor. Patient delirious, not tolerating TF's  S/p EGD shows erosive and severe esophagitis    PAST MEDICAL HISTORY    :  Past Medical History  Diagnosis Date  . HTN (hypertension)   . Diabetes mellitus type II   . Obesity   . Arthritis   . Chronic kidney disease   . Amyloidosis    Past Surgical History  Procedure Laterality Date  . Total abdominal hysterectomy  10/1999    fibroids  . Combined abdominoplasty and liposuction  07/2000  . Total knee arthroplasty Right 09/28/2013    Procedure: RIGHT TOTAL KNEE ARTHROPLASTY;  Surgeon: Mauri Pole, MD;  Location: [facility];  Service: Orthopedics;  Laterality: Right;   Prior to Admission medications   Medication Sig Start Date End Date Taking? Authorizing Provider  metolazone (ZAROXOLYN) 5 MG tablet Take 5 mg by mouth daily.   Yes Historical Provider, MD  midodrine (PROAMATINE) 10 MG tablet Take 5 mg by mouth 3 (three) times daily.    Yes Historical Provider, MD  polyethylene glycol powder (GLYCOLAX/MIRALAX) powder Take 17 g by mouth daily as needed for moderate constipation. 09/20/14  Yes Abner Greenspan, MD  potassium chloride SA (K-DUR,KLOR-CON) 20 MEQ tablet Take 1 tablet (20 mEq total) by mouth daily. 07/25/14  Yes Abner Greenspan, MD  torsemide (DEMADEX) 20 MG tablet Take 20 mg by mouth 2 (two) times daily as needed (swelling).    Yes Historical Provider, MD   No Known Allergies   FAMILY HISTORY   Family History  Problem Relation Age of Onset  . Arthritis Mother   . Hypertension Mother   . Cancer Mother     breast, lung  .  Diabetes Mother   . Hypertension Father       SOCIAL HISTORY    reports that she has never smoked. She has never used smokeless tobacco. She reports that she does not drink alcohol or use illicit drugs.  Review of Systems  Unable to perform ROS: critical illness      VITAL SIGNS    Temp:  [97.4 F (36.3 C)-98 F (36.7 C)] 97.8 F (36.6 C) (06/04 0900) Pulse Rate:  [80-96] 93 (06/04 0600) Resp:  [12-27] 14 (06/04 0600) BP: (79-110)/(51-78) 85/61 mmHg (06/04 0600) SpO2:  [88 %-100 %] 92 % (06/04 0600) FiO2 (%):  [30 %-40 %] 35 % (06/04 0830) Weight:  [202 lb 6.1 oz (91.8 kg)] 202 lb 6.1 oz (91.8 kg) (06/04 0500) HEMODYNAMICS:   VENTILATOR SETTINGS: Vent Mode:  [-] Spontaneous FiO2 (%):  [30 %-40 %] 35 % Vt Set:  [737 mL] 737 mL PEEP:  [5 cmH20] 5 cmH20 Pressure Support:  [5 cmH20-15 cmH20] 15 cmH20 INTAKE / OUTPUT:  Intake/Output Summary (Last 24 hours) at 10/08/14 0942 Last data filed at 10/08/14 0800  Gross per 24 hour  Intake 2976.6 ml  Output   4194 ml  Net -1217.4 ml       PHYSICAL EXAM   Physical Exam  Constitutional: She appears distressed.  HENT:  Head: Normocephalic and atraumatic.  Eyes: Pupils are equal,  round, and reactive to light. No scleral icterus.  Neck: Normal range of motion. Neck supple.  Cardiovascular: Normal rate and regular rhythm.   No murmur heard. Pulmonary/Chest: No respiratory distress. She has no wheezes. She has rales.  resp distress  Abdominal: Soft. She exhibits no distension. There is no tenderness.  Musculoskeletal: She exhibits no edema.  Neurological: She displays normal reflexes. Coordination normal.  gcs<8T  Skin: Skin is warm. No rash noted. She is diaphoretic.       LABS   LABS:  CBC  Recent Labs Lab 10/31/2014 0538 10/07/14 0458 10/07/14 1927 10/08/14 0437  WBC 21.2* 23.4*  --  26.5*  HGB 7.1* 6.8* 8.8* 9.0*  HCT 20.7* 21.2* 25.9* 26.5*  PLT 55* 63*  --  75*   Coag's  Recent Labs Lab  10/05/14 0614 10/08/2014 0538 10/07/14 0458 10/07/14 1120 10/08/14 0437  APTT 50* 42* 41* 41* 43*  INR 3.00 2.25  --  2.03  --    BMET  Recent Labs Lab 10/07/14 2147 10/07/14 2346 10/08/14 0437  NA 136 136 136  K 4.1 4.1 3.8  CL 103 102 105  CO2 _0 BUN 37* 38* 39*  CREATININE SEE COMMENTS SEE COMMENTS SEE COMMENTS  GLUCOSE 189* 218* 146*   Electrolytes  Recent Labs Lab 10/24/2014 0538 10/20/2014 0651  10/27/2014 2338 10/07/14 0458 10/07/14 0730  10/07/14 2147 10/07/14 2346 10/08/14 0437  CALCIUM  --  9.0  < > 9.1  --  8.8*  < > 9.0 9.1 9.0  MG 1.8  --   --   --  1.9  --   --   --   --  2.0  PHOS  --  SEE COMMENTS  --  UNABLE TO REPORT DUE TO ICTERUS  --  SEE COMMENTS  --   --   --   --   < > = values in this interval not displayed. Sepsis Markers No results for input(s): LATICACIDVEN, PROCALCITON, O2SATVEN in the last 168 hours. ABG  Recent Labs Lab 10/04/14 0510 10/14/2014 1045 10/07/14 1125  PHART 7.41 7.44 7.34*  PCO2ART 44 44 51*  PO2ART 59* 66* 68*   Liver Enzymes  Recent Labs Lab 10/04/14 1148  10/05/14 0614  10/28/2014 0140  10/07/14 0730  10/07/14 1927 10/07/14 2147 10/07/14 2346  AST 77*  --  60*  --  55*  --   --   --   --   --   --   ALT 29  --  24  --  28  --   --   --   --   --   --   ALKPHOS 274*  --  266*  --  351*  --   --   --   --   --   --   BILITOT 20.9*  --  20.6*  --  23.9*  --  30.0*  --   --   --   --   ALBUMIN 3.7  < > 4.1  4.0  < > 4.0  < > 3.5  < > 3.4* 3.3* 3.2*  < > = values in this interval not displayed. Cardiac Enzymes No results for input(s): TROPONINI, PROBNP in the last 168 hours. Glucose  Recent Labs Lab 10/08/14 0410 10/08/14 0511 10/08/14 0616 10/08/14 0655 10/08/14 0823 10/08/14 0919  GLUCAP 159* 135* 120* 137* 152* 167*     Recent Results (from the past 240 hour(s))  Culture, blood (routine x  2)     Status: None (Preliminary result)   Collection Time: 10/04/14 11:50 AM  Result Value Ref Range  Status   Specimen Description BLOOD  Final   Special Requests NONE  Final   Culture NO GROWTH 4 DAYS  Final   Report Status PENDING  Incomplete  Culture, blood (routine x 2)     Status: None (Preliminary result)   Collection Time: 10/04/14 12:00 PM  Result Value Ref Range Status   Specimen Description BLOOD  Final   Special Requests NONE  Final   Culture NO GROWTH 4 DAYS  Final   Report Status PENDING  Incomplete  Culture, respiratory (NON-Expectorated)     Status: None   Collection Time: 10/04/14  2:00 PM  Result Value Ref Range Status   Specimen Description INDUCED SPUTUM  Final   Special Requests Normal  Final   Gram Stain   Final    EXCELLENT SPECIMEN - 90-100% WBCS MANY WBC SEEN MANY YEAST    Culture MODERATE GROWTH CANDIDA ALBICANS  Final   Report Status 10/07/2014 FINAL  Final     Current facility-administered medications:  .  Marland KitchenTPN (CLINIMIX-E) Adult, , Intravenous, Continuous TPN, Flora Lipps, MD, Last Rate: 70 mL/hr at 10/08/14 0800 .  acetaminophen (TYLENOL) tablet 650 mg, 650 mg, Oral, Q6H PRN **OR** acetaminophen (TYLENOL) suppository 650 mg, 650 mg, Rectal, Q6H PRN, Lytle Butte, MD, 650 mg at 10/03/14 1030 .  albuterol (PROVENTIL) (2.5 MG/3ML) 0.083% nebulizer solution 2.5 mg, 2.5 mg, Nebulization, Q6H PRN, Juluis Mire, MD, 2.5 mg at 10/03/14 0816 .  antiseptic oral rinse (CPC / CETYLPYRIDINIUM CHLORIDE 0.05%) solution 7 mL, 7 mL, Mouth Rinse, QID, Herbon E Raul Del, MD, 7 mL at 10/08/14 0400 .  budesonide (PULMICORT) nebulizer solution 0.5 mg, 0.5 mg, Nebulization, BID, Flora Lipps, MD, 0.5 mg at 10/08/14 0828 .  chlorhexidine (PERIDEX) 0.12 % solution 15 mL, 15 mL, Mouth Rinse, BID, Erby Pian, MD, 15 mL at 10/08/14 0756 .  clindamycin (CLEOCIN) IVPB 600 mg, 600 mg, Intravenous, 3 times per day, Adrian Prows, MD, 600 mg at 10/08/14 [phone] .  feeding supplement (VITAL HIGH PROTEIN) liquid 1,000 mL, 1,000 mL, Per Tube, Q24H, Flora Lipps, MD .  fentaNYL  2590mg in NS 2585m(1081mml) infusion-PREMIX, 10 mcg/hr, Intravenous, Continuous, Srikar Sudini, MD, Last Rate: 1 mL/hr at 10/08/14 0818, 10 mcg/hr at 10/08/14 0818 .  free water 25 mL, 25 mL, Per Tube, Q4H, KurFlora LippsD, 25 mL at 10/07/14 2245 .  heparin injection 1,000-6,000 Units, 1,000-6,000 Units, CRRT, PRN, HarMurlean IbaD, 1,000 Units at 10/01/14 1435 .  insulin regular (NOVOLIN R,HUMULIN R) 250 Units in sodium chloride 0.9 % 250 mL (1 Units/mL) infusion, , Intravenous, Continuous, KurFlora LippsD, Last Rate: 1.5 mL/hr at 10/08/14 0700 .  ipratropium-albuterol (DUONEB) 0.5-2.5 (3) MG/3ML nebulizer solution 3 mL, 3 mL, Nebulization, Q4H, KurFlora LippsD, 3 mL at 10/08/14 0827 .  lactulose (CHRONULAC) 10 GM/15ML solution 20 g, 20 g, Oral, BID, MatJosefine ClassD, 20 g at 10/07/14 2152 .  levofloxacin (LEVAQUIN) IVPB 500 mg, 500 mg, Intravenous, Q24H, DavAdrian ProwsD, 500 mg at 10/07/14 1849 .  midodrine (PROAMATINE) tablet 10 mg, 10 mg, Oral, TID, DavLytle ButteD, 10 mg at 10/07/14 2048 .  norepinephrine (LEVOPHED) 4mg44m D5W 250mL24mmix infusion, 0-40 mcg/min, Intravenous, Titrated, Srikar Sudini, MD .  pantoprazole (PROTONIX) injection 40 mg, 40 mg, Intravenous, Q12H, KuriaFlora Lipps 40 mg at 10/07/14 2152 .  polyethylene glycol powder (GLYCOLAX/MIRALAX) container 17 g, 17 g, Oral, Daily PRN, Lytle Butte, MD .  pureflow IV solution for Dialysis, , CRRT, Continuous, Munsoor Lateef, MD, Last Rate: 2,000 mL/hr at 10/08/14 0734 .  sodium chloride 0.9 % injection 10-40 mL, 10-40 mL, Intracatheter, Q12H, Srikar Sudini, MD, 20 mL at 10/07/14 2153 .  sodium chloride 0.9 % injection 10-40 mL, 10-40 mL, Intracatheter, PRN, Srikar Sudini, MD .  sucralfate (CARAFATE) 1 GM/10ML suspension 1 g, 1 g, Oral, 4 times per day, Josefine Class, MD, 1 g at 10/08/14 [phone] .  vasopressin (PITRESSIN) 40 Units in sodium chloride 0.9 % 250 mL (0.16 Units/mL) infusion, 0.03 Units/min,  Intravenous, Continuous, Lytle Butte, MD, Last Rate: 11.3 mL/hr at 10/08/14 0800, 0.03 Units/min at 10/08/14 0800 .  vecuronium (NORCURON) injection 10 mg, 10 mg, Intravenous, Once, Flora Lipps, MD, 10 mg at 10/31/2014 1243  IMAGING    No results found.    Indwelling Urinary Catheter continued, requirement due to   Reason to continue Indwelling Urinary Catheter for strict Intake/Output monitoring for hemodynamic instability   Central Line continued, requirement due to   Reason to continue [employer] Monitoring of central venous pressure or other hemodynamic parameters   Ventilator continued, requirement due to, resp failure    Ventilator Sedation RASS 0 to -2      ASSESSMENT/PLAN   64 yo AAF admitted to ICU for acute septic shock and progressive resp failure with renal failure Patient with underlying Amyloidosis with progressive multiorgan failure   PULMONARY-Respiratory Failure -continue Full MV support -continue Bronchodilator Therapy -Wean Fio2 and PEEP as tolerated -will perform SAT/SBT when respiratory parameters are met  CARDIOVASCULAR -septic shock-use vasopressors to keep MAP>65 -ABX as prescribed  RENAL-no urine output-at this time, patient not likely to recover kidney function -progressive renal failure from ATN -on CRRT -follow up Nephrology recs  GASTROINTESTINAL-GIB-severe erosive esophagitis -bowel rest for now -liver failure-worsening -follow up [place] -follow H/h s/p transfusion -follow INR s/p FFP  INFECTIOUS- -streptococcus bacteremia from cellulitis -on abx -repeat blood cultures pending -follow up ID recs   ENDOCRINE -follow FSBS  NEUROLOGIC -wean sedation and assess neuro status  NUTRITION-on TPN  I have personally obtained a history, examined the patient, evaluated laboratory and imaging results, formulated the assessment and plan and placed orders.  The Patient requires high complexity decision making  for assessment and support, frequent evaluation and titration of therapies, application of advanced monitoring technologies and extensive interpretation of multiple databases. Critical Care Time devoted to patient care services described in this note is 45 minutes.   Overall, patient is critically ill, prognosis is very poor/guarded. Patient at high risk for cardiac arrest and death.   Palliative care team consulted-recommend DNR status, recommend comfort care measures  Corrin Parker, M.D. [facility] [place] Director Intensive Care Unit   10/08/2014, 9:42 AM

## 2014-10-08 NOTE — Progress Notes (Signed)
Nutrition Follow-up  DOCUMENTATION CODES:     INTERVENTION:   (Parenteral nutrition:) Recommend continuing TPN at 1ml/hr at this time to meet 100% of kcals needs and 88% of protein needs. Enteral nutrition: Trophic feeding of Vital High Protein started on 6/3 at 20 ml/ hr. Residuals checked and elevated; MD Hower order TF held. Spoke with MD Kasa this AM- TF to remain on hold at this time. Will re-evaluate x 24 hrs.  Nutrition Related Medications: Insulin gtt started  NUTRITION DIAGNOSIS:  Inadequate oral intake related to inability to eat as evidenced by NPO status, continues but being addressed with TPN at goal at this time  GOAL:   (Goal would be for pt to tolerate enteral nutrition within 48-72 hr and for TPN to be discontinued); goal not currently met    MONITOR:   (PN, EN, Electrolyte and renal profile, Anthropometric, Digestive system )  REASON FOR ASSESSMENT:  Consult New TPN/TNA  ASSESSMENT:  Clinical Update: increased residuals of trophic feeds; TF held; TPN continues at goal rate Typical Food/ Fluid Intake: NPO Labs:  Glucose Profile:  Recent Labs  10/08/14 0823 10/08/14 0919 10/08/14 1004  GLUCAP 152* 167* 160*   Electrolyte and Renal Profile:  Recent Labs Lab 10/19/2014 0538  10/15/2014 2338 10/07/14 0458 10/07/14 0730  10/07/14 2147 10/07/14 2346 10/08/14 0437  BUN  --   < > 29*  --  33*  < > 37* 38* 39*  CREATININE  --   < > UNABLE TO REPORT DUE TO ICTERUS  --  SEE COMMENTS  < > SEE COMMENTS SEE COMMENTS SEE COMMENTS  NA  --   < > 137  --  137  < > 136 136 136  K  --   < > 3.9  --  3.9  < > 4.1 4.1 3.8  MG 1.8  --   --  1.9  --   --   --   --  2.0  PHOS  --   < > UNABLE TO REPORT DUE TO ICTERUS  --  SEE COMMENTS  --   --   --  SEE COMMENT  < > = values in this interval not displayed.  Meds: Insulin gtt, levophed, fentanyl Physical Findings: on vent, on CRRT,   Height:  Ht Readings from Last 1 Encounters:  10/04/2014 $RemoveB'5\' 1"'ftpXyXDe$  (1.549 m)     Weight:  Wt Readings from Last 1 Encounters:  10/08/14 202 lb 6.1 oz (91.8 kg)    Ideal Body Weight:     Wt Readings from Last 10 Encounters:  10/08/14 202 lb 6.1 oz (91.8 kg)  09/20/14 216 lb 4 oz (98.09 kg)  09/13/14 210 lb 15.7 oz (95.7 kg)  07/06/14 216 lb 12.8 oz (98.34 kg)  06/24/14 216 lb 8 oz (98.204 kg)  06/07/14 209 lb 12.8 oz (95.165 kg)  03/16/14 194 lb 8 oz (88.225 kg)  01/14/14 197 lb 12 oz (89.699 kg)  01/11/14 193 lb 12 oz (87.884 kg)  09/28/13 203 lb (92.08 kg)    BMI:  Body mass index is 38.26 kg/(m^2).  Estimated Nutritional Needs:  Kcal:  (11-14 kcals/d) Using actual wt of 95kg) 1050-1330 kcals/d  Protein:  (2.0-2.5 g/d) Using IbW of 48kg 96-120 g/d  Fluid:  (25-13ml/kg) Using IBW of 48kg 1200-147ml/d  Skin:  Reviewed, no issues  Diet Order:  Diet NPO time specified .TPN (CLINIMIX-E) Adult  EDUCATION NEEDS:  No education needs identified at this time   Intake/Output Summary (Last 24 hours)  at 10/08/14 1035 Last data filed at 10/08/14 0800  Gross per 24 hour  Intake 2839.7 ml  Output   4027 ml  Net -1187.3 ml    Last BM:  6/3  Roda Shutters, RDN Pager: [phone] Office: [place]

## 2014-10-08 NOTE — Progress Notes (Signed)
Subjective:  Remains critically ill. Still on CRRT. UF 175 cc/hr Obligate intake about 100 cc/hr Remains anuric. TPN 70 cc/hr Vasopressin Nor Epinephrine  Objective:  Vital signs in last 24 hours:  Temp:  [97.4 F (36.3 C)-98 F (36.7 C)] 97.8 F (36.6 C) (06/04 0900) Pulse Rate:  [80-96] 93 (06/04 0600) Resp:  [12-27] 14 (06/04 0600) BP: (79-110)/(51-78) 85/61 mmHg (06/04 0600) SpO2:  [92 %-100 %] 92 % (06/04 0600) FiO2 (%):  [35 %-40 %] 35 % (06/04 0830) Weight:  [91.8 kg (202 lb 6.1 oz)] 91.8 kg (202 lb 6.1 oz) (06/04 0500)  Weight change: -1.9 kg (-4 lb 3 oz) Filed Weights   10/25/2014 0515 10/07/14 0500 10/08/14 0500  Weight: 96.5 kg (212 lb 11.9 oz) 93.7 kg (206 lb 9.1 oz) 91.8 kg (202 lb 6.1 oz)    Intake/Output: I/O last 3 completed shifts: In: 4984.7 [I.V.:1585.7; Blood:372; NG/GT:320; IV Piggyback:250] Out: 5621 [Urine:21; Emesis/NG output:670; Other:5853]     Physical Exam: General: Critically ill appearing  HEENT Scleral icterus noted, ETT in place  Neck supple  Pulm/lungs Decreased breath sounds at bases, vent assisted  CVS/Heart S1S2 no rubs  Abdomen:  Soft, distended, tense, decreased Bowel sounds  Extremities: 3+ anasarca  Neurologic: On the vent  Skin: Warm/dry  Access: Temp cath- rt femoral       Basic Metabolic Panel:  Recent Labs Lab 10/04/14 0818  10/05/14 3086  10/05/2014 0538 10/12/2014 0651  10/21/2014 2338 10/07/14 0458 10/07/14 0730 10/07/14 1547 10/07/14 1927 10/07/14 2147 10/07/14 2346 10/08/14 0437  NA  --   < > 139  < >  --  137  < > 137  --  137 137 136 136 136 136  K  --   < > 4.0  < >  --  3.9  < > 3.9  --  3.9 3.8 3.9 4.1 4.1 3.8  CL  --   < > 106  < >  --  104  < > 104  --  104 104 103 103 102 105  CO2  --   < > 28  < >  --  28  < > 28  --  27 28 27 26 25 26   GLUCOSE  --   < > 201*  < >  --  236*  < > 237*  --  235* 164* 143* 189* 218* 146*  BUN  --   < > 17  < >  --  19  < > 29*  --  33* 35* 35* 37* 38* 39*   CREATININE  --   < > <0.30*  < >  --  unable to report due to icterus  < > UNABLE TO REPORT DUE TO ICTERUS  --  SEE COMMENTS ICTERUS AT THIS LEVEL MAY AFFECT RESULT SEE COMMENTS SEE COMMENTS SEE COMMENTS SEE COMMENTS  CALCIUM  --   < > 9.0  < >  --  9.0  < > 9.1  --  8.8* 9.1 9.1 9.0 9.1 9.0  MG 1.8  --  1.6*  --  1.8  --   --   --  1.9  --   --   --   --   --  2.0  PHOS ICTERUS AT THIS LEVEL MAY AFFECT RESULT  < > SEE COMMENTS  --   --  SEE COMMENTS  --  UNABLE TO REPORT DUE TO ICTERUS  --  SEE COMMENTS  --   --   --   --  SEE COMMENT  < > = values in this interval not displayed.   CBC:  Recent Labs Lab 10/02/14 1030  10/04/14 1148 10/05/14 0614 10/27/2014 0538 10/07/14 0458 10/07/14 1927 10/08/14 0437  WBC 24.6*  < > 16.7* 20.3* 21.2* 23.4*  --  26.5*  NEUTROABS 21.1*  --   --   --   --   --   --   --   HGB 5.9*  < > 7.3* 6.3* 7.1* 6.8* 8.8* 9.0*  HCT 18.6*  < > 21.4* 18.5* 20.7* 21.2* 25.9* 26.5*  MCV 88.6  < > 87.8 91.5 87.2 87.9  --  90.2  PLT 68*  < > 44* 43* 55* 63*  --  75*  < > = values in this interval not displayed.    Microbiology: Results for orders placed or performed during the hospital encounter of 09/15/2014  Blood culture (routine x 2)     Status: None   Collection Time: 09/08/2014 12:45 AM  Result Value Ref Range Status   Specimen Description BLOOD  Final   Special Requests NONE  Final   Culture  Setup Time   Final    GRAM POSITIVE COCCI IN CHAINS IN BOTH AEROBIC AND ANAEROBIC BOTTLES CRITICAL RESULT CALLED TO, READ BACK BY AND VERIFIED WITH: PAM CRAWFORD AT 1406 09/25/2014 DV    Culture STREPTOCOCCUS SPECIES  Final   Report Status 10/01/2014 FINAL  Final   Organism ID, Bacteria STREPTOCOCCUS SPECIES  Final      Susceptibility   Streptococcus species - MIC (ETEST)*    ERYTHROMYCIN Value in next row Resistant      RESISTANT6    PENICILLIN Value in next row Intermediate      INTERMEDIATE0.25    CEFTRIAXONE Value in next row Sensitive      SENSITIVE0.047     VANCOMYCIN Value in next row Sensitive      SENSITIVE1.0    LEVOFLOXACIN Value in next row Sensitive      SENSITIVE1.0    * STREPTOCOCCUS SPECIES  Blood culture (routine x 2)     Status: None   Collection Time: 09/12/2014 12:45 AM  Result Value Ref Range Status   Specimen Description BLOOD  Final   Special Requests NONE  Final   Culture  Setup Time   Final    GRAM POSITIVE COCCI IN CHAINS IN BOTH AEROBIC AND ANAEROBIC BOTTLES CRITICAL RESULT CALLED TO, READ BACK BY AND VERIFIED WITH: PAM CRAWFORD AT 1406 09/23/2014 DV    Culture   Final    STREPTOCOCCUS SPECIES REFER TO ACCESSION [id number] COLLECTED ON 09/27/2014 AT 0045 FOR SENSITIVITIES    Report Status 09/30/2014 FINAL  Final  Culture, blood (routine x 2)     Status: None (Preliminary result)   Collection Time: 10/04/14 11:50 AM  Result Value Ref Range Status   Specimen Description BLOOD  Final   Special Requests NONE  Final   Culture NO GROWTH 4 DAYS  Final   Report Status PENDING  Incomplete  Culture, blood (routine x 2)     Status: None (Preliminary result)   Collection Time: 10/04/14 12:00 PM  Result Value Ref Range Status   Specimen Description BLOOD  Final   Special Requests NONE  Final   Culture NO GROWTH 4 DAYS  Final   Report Status PENDING  Incomplete  Culture, respiratory (NON-Expectorated)     Status: None   Collection Time: 10/04/14  2:00 PM  Result Value Ref Range Status   Specimen Description  INDUCED SPUTUM  Final   Special Requests Normal  Final   Gram Stain   Final    EXCELLENT SPECIMEN - 90-100% WBCS MANY WBC SEEN MANY YEAST    Culture MODERATE GROWTH CANDIDA ALBICANS  Final   Report Status 10/07/2014 FINAL  Final    Coagulation Studies:  Recent Labs  10/28/2014 0538 10/07/14 1120  LABPROT 25.0* 23.1*  INR 2.25 2.03    Urinalysis: No results for input(s): COLORURINE, LABSPEC, PHURINE, GLUCOSEU, HGBUR, BILIRUBINUR, KETONESUR, PROTEINUR, UROBILINOGEN, NITRITE, LEUKOCYTESUR in the last 72  hours.  Invalid input(s): APPERANCEUR    Imaging: Dg Chest Port 1 View  10/07/2014   CLINICAL DATA:  Respiratory failure/ventilator dependency.  EXAM: PORTABLE CHEST - 1 VIEW  COMPARISON:  10/03/2014  FINDINGS: Endotracheal tube tip 3.1 cm above the carina. Right internal jugular line tip: Upper SVC. Nasogastric tube enters the stomach.  Moderate enlargement of the cardiopericardial silhouette noted with bilateral increased airspace opacities.  Bilateral degenerative glenohumeral arthropathy. Tortuous thoracic aorta.  IMPRESSION: 1. Worsened bilateral airspace opacities. In the context of the patient's moderate enlargement of the cardiopericardial silhouette, the appearance is suspicious for acute pulmonary edema. 2. The endotracheal tube has been successfully retracted, and the tubes and lines appear satisfactorily positioned.   Electronically Signed   By: Van Clines M.D.   On: 10/07/2014 08:42     Medications:   . Marland KitchenTPN (CLINIMIX-E) Adult 70 mL/hr at 10/08/14 0800  . fentaNYL infusion INTRAVENOUS 10 mcg/hr (10/08/14 0818)  . insulin (NOVOLIN-R) infusion 1.5 mL/hr at 10/08/14 0700  . norepinephrine    . pureflow 2,000 mL/hr at 10/08/14 0734  . vasopressin (PITRESSIN) infusion - *FOR SHOCK* 0.03 Units/min (10/08/14 0800)   . antiseptic oral rinse  7 mL Mouth Rinse QID  . budesonide (PULMICORT) nebulizer solution  0.5 mg Nebulization BID  . chlorhexidine  15 mL Mouth Rinse BID  . clindamycin (CLEOCIN) IV  600 mg Intravenous 3 times per day  . feeding supplement (VITAL HIGH PROTEIN)  1,000 mL Per Tube Q24H  . free water  25 mL Per Tube Q4H  . ipratropium-albuterol  3 mL Nebulization Q4H  . lactulose  20 g Oral BID  . levofloxacin (LEVAQUIN) IV  500 mg Intravenous Q24H  . midodrine  10 mg Oral TID  . pantoprazole (PROTONIX) IV  40 mg Intravenous Q12H  . sodium chloride  10-40 mL Intracatheter Q12H  . sucralfate  1 g Oral 4 times per day  . vecuronium  10 mg Intravenous Once    acetaminophen **OR** acetaminophen, albuterol, heparin, polyethylene glycol powder, sodium chloride  Assessment/ Plan:   64 y.o. African [place] female with diabetes mellitus type 2, hypertension, morbid obesity, osteoarthritis, status post right knee surgery, nephrotic syndrome with edema, hypoalbuminemia, renal insufficiency and proteinuria.  1. ARF with CKD st 3 . Baseline cr 1.3. (08/2014)  2. Nephrotic Syndrome with proteinuria: Renal Amyloidosis.  3. Generalized Edema 4. Hypoalbuminemia 5. Right groin cellulitis /sepsis- strep species  6. Diabetes Mellitus type II 7. Hypotension  Plan: Patient remains essentially anuric at this point in time. She has tolerated CRRT well however. Family has not made a decision regarding her overall disposition at this time. Therefore we will continue the patient on CRRT with the current parameters. Palliative care is involved in the case and is having ongoing discussions. The patient's kidney disease is likely to progress to end-stage renal disease. Overall prognosis quite poor. Last albumin 3.2  LOS: 10 [name] 6/4/201610:20 AM

## 2014-10-08 NOTE — Progress Notes (Signed)
Tube feeds ordered for 20 ml/hr with 25 every 4 hour flush.  Original residual 570, 350 returned.  Tube feeding held till next residual check.  Residual check 370 at midnight.  Dr Lavetta Nielsen order to hold tube feedings.

## 2014-10-08 NOTE — Progress Notes (Signed)
[place] NOTE  Pharmacy Consult for Electrolyte and CRRT medication management Indication: ICU Status, CRRT, TPN   No Known Allergies  Patient Measurements: Height: 5\' 1"  (154.9 cm) Weight: 206 lb 9.1 oz (93.7 kg) IBW/kg (Calculated) : 47.8   Vital Signs: Temp: 98 F (36.7 C) (06/04 0000) Temp Source: Axillary (06/04 0000) BP: 99/60 mmHg (06/03 2300) Pulse Rate: 94 (06/03 2300) Intake/Output from previous day: 06/03 0701 - 06/04 0700 In: 2234.6 [I.V.:462.6; Blood:372; NG/GT:200; IV Piggyback:150; TPN:1050] Out: 2515  Intake/Output from this shift: Total I/O In: 634.8 [I.V.:264.8; NG/GT:20; TPN:350] Out: 509 [Other:509] Vent settings for last 24 hours: Vent Mode:  [-] Spontaneous FiO2 (%):  [30 %-40 %] 35 % Set Rate:  [20 bmp] 20 bmp Vt Set:  [450 mL] 450 mL PEEP:  [5 cmH20] 5 cmH20 Pressure Support:  [5 cmH20-15 cmH20] 15 cmH20  Labs:  Recent Labs  10/05/14 0614  11/02/2014 0140 10/12/2014 0538 10/15/2014 0651  10/16/2014 2338 10/07/14 0458 10/07/14 0730 10/07/14 1120  10/07/14 1927 10/07/14 2147 10/07/14 2346  WBC 20.3*  --   --  21.2*  --   --   --  23.4*  --   --   --   --   --   --   HGB 6.3*  --   --  7.1*  --   --   --  6.8*  --   --   --  8.8*  --   --   HCT 18.5*  --   --  20.7*  --   --   --  21.2*  --   --   --  25.9*  --   --   PLT 43*  --   --  55*  --   --   --  63*  --   --   --   --   --   --   APTT 50*  --   --  42*  --   --   --  41*  --  41*  --   --   --   --   INR 3.00  --   --  2.25  --   --   --   --   --  2.03  --   --   --   --   CREATININE <0.30*  < > UNABLE TO REPORT DUE TO ICTERUS INTERFERENCE  --  unable to report due to icterus  < > UNABLE TO REPORT DUE TO ICTERUS  --  SEE COMMENTS  --   < > SEE COMMENTS SEE COMMENTS SEE COMMENTS  MG 1.6*  --   --  1.8  --   --   --  1.9  --   --   --   --   --   --   PHOS SEE COMMENTS  --   --   --  SEE COMMENTS  --  UNABLE TO REPORT DUE TO ICTERUS  --  SEE COMMENTS  --   --   --    --   --   ALBUMIN 4.1  4.0  < > 4.0  --  3.9  < > 3.5  --  3.5  --   < > 3.4* 3.3* 3.2*  PROT 7.1  --  7.6  --   --   --   --   --   --   --   --   --   --   --  AST 60*  --  55*  --   --   --   --   --   --   --   --   --   --   --   ALT 24  --  28  --   --   --   --   --   --   --   --   --   --   --   ALKPHOS 266*  --  351*  --   --   --   --   --   --   --   --   --   --   --   BILITOT 20.6*  --  23.9*  --   --   --   --   --  30.0*  --   --   --   --   --   BILIDIR 15.5*  --   --   --   --   --   --   --   --   --   --   --   --   --   IBILI 5.1*  --   --   --   --   --   --   --   --   --   --   --   --   --   < > = values in this interval not displayed. CrCl cannot be calculated (Patient has no serum creatinine result on file.).   Recent Labs  10/07/14 2102 10/07/14 2203 10/07/14 2303  GLUCAP 156* 166* 175*    Microbiology: Recent Results (from the past 720 hour(s))  Blood culture (routine x 2)     Status: None   Collection Time: 09/21/2014 12:45 AM  Result Value Ref Range Status   Specimen Description BLOOD  Final   Special Requests NONE  Final   Culture  Setup Time   Final    GRAM POSITIVE COCCI IN CHAINS IN BOTH AEROBIC AND ANAEROBIC BOTTLES CRITICAL RESULT CALLED TO, READ BACK BY AND VERIFIED WITH: PAM CRAWFORD AT 1406 10/01/2014 DV    Culture STREPTOCOCCUS SPECIES  Final   Report Status 10/01/2014 FINAL  Final   Organism ID, Bacteria STREPTOCOCCUS SPECIES  Final      Susceptibility   Streptococcus species - MIC (ETEST)*    ERYTHROMYCIN Value in next row Resistant      RESISTANT6    PENICILLIN Value in next row Intermediate      INTERMEDIATE0.25    CEFTRIAXONE Value in next row Sensitive      SENSITIVE0.047    VANCOMYCIN Value in next row Sensitive      SENSITIVE1.0    LEVOFLOXACIN Value in next row Sensitive      SENSITIVE1.0    * STREPTOCOCCUS SPECIES  Blood culture (routine x 2)     Status: None   Collection Time: 09/07/2014 12:45 AM  Result Value Ref  Range Status   Specimen Description BLOOD  Final   Special Requests NONE  Final   Culture  Setup Time   Final    GRAM POSITIVE COCCI IN CHAINS IN BOTH AEROBIC AND ANAEROBIC BOTTLES CRITICAL RESULT CALLED TO, READ BACK BY AND VERIFIED WITH: PAM CRAWFORD AT 1406 09/13/2014 DV    Culture   Final    STREPTOCOCCUS SPECIES REFER TO ACCESSION [id number] COLLECTED ON 09/11/2014 AT 0045 FOR SENSITIVITIES    Report Status 09/30/2014 FINAL  Final  Culture,  blood (routine x 2)     Status: None (Preliminary result)   Collection Time: 10/04/14 11:50 AM  Result Value Ref Range Status   Specimen Description BLOOD  Final   Special Requests NONE  Final   Culture NO GROWTH 3 DAYS  Final   Report Status PENDING  Incomplete  Culture, blood (routine x 2)     Status: None (Preliminary result)   Collection Time: 10/04/14 12:00 PM  Result Value Ref Range Status   Specimen Description BLOOD  Final   Special Requests NONE  Final   Culture NO GROWTH 3 DAYS  Final   Report Status PENDING  Incomplete  Culture, respiratory (NON-Expectorated)     Status: None   Collection Time: 10/04/14  2:00 PM  Result Value Ref Range Status   Specimen Description INDUCED SPUTUM  Final   Special Requests Normal  Final   Gram Stain   Final    EXCELLENT SPECIMEN - 90-100% WBCS MANY WBC SEEN MANY YEAST    Culture MODERATE GROWTH CANDIDA ALBICANS  Final   Report Status 10/07/2014 FINAL  Final    Medications:  Scheduled:  . antiseptic oral rinse  7 mL Mouth Rinse QID  . budesonide (PULMICORT) nebulizer solution  0.5 mg Nebulization BID  . chlorhexidine  15 mL Mouth Rinse BID  . clindamycin (CLEOCIN) IV  600 mg Intravenous 3 times per day  . feeding supplement (VITAL HIGH PROTEIN)  1,000 mL Per Tube Q24H  . free water  25 mL Per Tube Q4H  . ipratropium-albuterol  3 mL Nebulization Q4H  . lactulose  20 g Oral BID  . levofloxacin (LEVAQUIN) IV  500 mg Intravenous Q24H  . midodrine  10 mg Oral TID  . pantoprazole (PROTONIX) IV  40  mg Intravenous Q12H  . sodium chloride  10-40 mL Intracatheter Q12H  . sucralfate  1 g Oral 4 times per day  . vecuronium  10 mg Intravenous Once   Infusions:  . Marland KitchenTPN (CLINIMIX-E) Adult 70 mL/hr at 10/08/14 0000  . fentaNYL infusion INTRAVENOUS 400 mcg/hr (10/08/14 0000)  . insulin (NOVOLIN-R) infusion 2.6 mL/hr at 10/08/14 0000  . norepinephrine (LEVOPHED) Adult infusion 5.013 mcg/min (10/08/14 0000)  . pureflow 2,000 mL/hr at 11/01/2014 1642  . vasopressin (PITRESSIN) infusion - *FOR SHOCK* 0.03 Units/min (10/08/14 0000)   PRN: acetaminophen **OR** acetaminophen, albuterol, heparin, polyethylene glycol powder, sodium chloride  Assessment: 64 yo morbidly obese female with chronic trach admitted with respiratory failure requiring ventilator support.    Plan:   1. Electrolytes/TPN: Magnesium 2 g IV x 1. All other electolytes are WNL. Will obtain follow-up labs with am labs. Patient initiated on insulin drip. Will need to ensure that MVI and Trace are added to TPNs.   2. CRRT Medication adjustment: no further adjustments warranted at this time.   Pharmacy will continue to monitor and adjust per consult.     Fayth Trefry L 10/08/2014,12:46 AM

## 2014-10-09 DIAGNOSIS — K7200 Acute and subacute hepatic failure without coma: Secondary | ICD-10-CM

## 2014-10-09 DIAGNOSIS — N179 Acute kidney failure, unspecified: Secondary | ICD-10-CM

## 2014-10-09 DIAGNOSIS — K7290 Hepatic failure, unspecified without coma: Secondary | ICD-10-CM

## 2014-10-09 LAB — RENAL FUNCTION PANEL
ALBUMIN: 3.2 g/dL — AB (ref 3.5–5.0)
ANION GAP: 8 (ref 5–15)
Albumin: 3 g/dL — ABNORMAL LOW (ref 3.5–5.0)
Albumin: 2.9 g/dL — ABNORMAL LOW (ref 3.5–5.0)
Anion gap: 4 — ABNORMAL LOW (ref 5–15)
Anion gap: 6 (ref 5–15)
BUN: 45 mg/dL — ABNORMAL HIGH (ref 6–20)
BUN: 45 mg/dL — ABNORMAL HIGH (ref 6–20)
BUN: 50 mg/dL — AB (ref 6–20)
CALCIUM: 9 mg/dL (ref 8.9–10.3)
CALCIUM: 9.2 mg/dL (ref 8.9–10.3)
CALCIUM: 9.3 mg/dL (ref 8.9–10.3)
CHLORIDE: 103 mmol/L (ref 101–111)
CO2: 25 mmol/L (ref 22–32)
CO2: 26 mmol/L (ref 22–32)
CO2: 27 mmol/L (ref 22–32)
Chloride: 105 mmol/L (ref 101–111)
Chloride: 105 mmol/L (ref 101–111)
Glucose, Bld: 174 mg/dL — ABNORMAL HIGH (ref 65–99)
Glucose, Bld: 181 mg/dL — ABNORMAL HIGH (ref 65–99)
Glucose, Bld: 185 mg/dL — ABNORMAL HIGH (ref 65–99)
Potassium: 4.3 mmol/L (ref 3.5–5.1)
Potassium: 4.3 mmol/L (ref 3.5–5.1)
Potassium: 4.6 mmol/L (ref 3.5–5.1)
Sodium: 136 mmol/L (ref 135–145)
Sodium: 136 mmol/L (ref 135–145)
Sodium: 137 mmol/L (ref 135–145)

## 2014-10-09 LAB — CULTURE, BLOOD (ROUTINE X 2)
CULTURE: NO GROWTH
Culture: NO GROWTH

## 2014-10-09 LAB — GLUCOSE, CAPILLARY
GLUCOSE-CAPILLARY: 163 mg/dL — AB (ref 65–99)
GLUCOSE-CAPILLARY: 165 mg/dL — AB (ref 65–99)
GLUCOSE-CAPILLARY: 159 mg/dL — AB (ref 65–99)
GLUCOSE-CAPILLARY: 168 mg/dL — AB (ref 65–99)
GLUCOSE-CAPILLARY: 149 mg/dL — AB (ref 65–99)
GLUCOSE-CAPILLARY: 170 mg/dL — AB (ref 65–99)
GLUCOSE-CAPILLARY: 153 mg/dL — AB (ref 65–99)
GLUCOSE-CAPILLARY: 146 mg/dL — AB (ref 65–99)
Glucose-Capillary: 147 mg/dL — ABNORMAL HIGH (ref 65–99)
Glucose-Capillary: 151 mg/dL — ABNORMAL HIGH (ref 65–99)
Glucose-Capillary: 152 mg/dL — ABNORMAL HIGH (ref 65–99)
Glucose-Capillary: 160 mg/dL — ABNORMAL HIGH (ref 65–99)
Glucose-Capillary: 160 mg/dL — ABNORMAL HIGH (ref 65–99)
Glucose-Capillary: 161 mg/dL — ABNORMAL HIGH (ref 65–99)
Glucose-Capillary: 162 mg/dL — ABNORMAL HIGH (ref 65–99)
Glucose-Capillary: 164 mg/dL — ABNORMAL HIGH (ref 65–99)
Glucose-Capillary: 170 mg/dL — ABNORMAL HIGH (ref 65–99)
Glucose-Capillary: 171 mg/dL — ABNORMAL HIGH (ref 65–99)
Glucose-Capillary: 172 mg/dL — ABNORMAL HIGH (ref 65–99)
Glucose-Capillary: 176 mg/dL — ABNORMAL HIGH (ref 65–99)
Glucose-Capillary: 177 mg/dL — ABNORMAL HIGH (ref 65–99)
Glucose-Capillary: 178 mg/dL — ABNORMAL HIGH (ref 65–99)
Glucose-Capillary: 184 mg/dL — ABNORMAL HIGH (ref 65–99)
Glucose-Capillary: 185 mg/dL — ABNORMAL HIGH (ref 65–99)

## 2014-10-09 LAB — BASIC METABOLIC PANEL
Anion gap: 6 (ref 5–15)
BUN: 47 mg/dL — ABNORMAL HIGH (ref 6–20)
CO2: 26 mmol/L (ref 22–32)
Calcium: 9.1 mg/dL (ref 8.9–10.3)
Chloride: 104 mmol/L (ref 101–111)
Glucose, Bld: 178 mg/dL — ABNORMAL HIGH (ref 65–99)
Potassium: 4.6 mmol/L (ref 3.5–5.1)
Sodium: 136 mmol/L (ref 135–145)

## 2014-10-09 LAB — CBC
HCT: 27.4 % — ABNORMAL LOW (ref 35.0–47.0)
HEMOGLOBIN: 9.1 g/dL — AB (ref 12.0–16.0)
MCH: 30.6 pg (ref 26.0–34.0)
MCHC: 33.3 g/dL (ref 32.0–36.0)
MCV: 91.9 fL (ref 80.0–100.0)
Platelets: 72 10*3/uL — ABNORMAL LOW (ref 150–440)
RBC: 2.98 MIL/uL — ABNORMAL LOW (ref 3.80–5.20)
RDW: 17.4 % — AB (ref 11.5–14.5)
WBC: 25.8 10*3/uL — AB (ref 3.6–11.0)

## 2014-10-09 LAB — PHOSPHORUS

## 2014-10-09 LAB — MAGNESIUM: Magnesium: 2.1 mg/dL (ref 1.7–2.4)

## 2014-10-09 LAB — APTT: APTT: 46 s — AB (ref 24–36)

## 2014-10-09 MED ORDER — TRACE MINERALS CR-CU-MN-SE-ZN 10-1000-500-60 MCG/ML IV SOLN
INTRAVENOUS | Status: AC
  Administered 2014-10-09: 18:00:00 via INTRAVENOUS
  Filled 2014-10-09: qty 1680

## 2014-10-09 NOTE — Progress Notes (Signed)
Nutrition Follow-up  DOCUMENTATION CODES:     INTERVENTION:   (Parenteral nutrition:) Recommend continuing TPN at 71ml/hr at this time to meet 100% of kcals needs and 88% of protein needs. Enteral nutrition: Spoke with MD Kasa this AM- TF to remain on hold at this time, no plans to re-initiate at this time Nutrition Related Medications: Insulin gtt continues  NUTRITION DIAGNOSIS:  Inadequate oral intake related to inability to eat as evidenced by NPO status, ongoing but addressed with TPN  GOAL:   (Goal would be for pt to tolerate enteral nutrition within 48-72 hr and for TPN to be discontinued)   MONITOR:   (PN, EN, Electrolyte and renal profile, Anthropometric, Digestive system )  REASON FOR ASSESSMENT:  Consult  (RD Follow Up)  ASSESSMENT:  Clinical Update: no plans to re-initiate TF at this time; cont TPN  Labs: Electrolyte and Renal Profile:  Recent Labs Lab 10/07/14 0458 10/07/14 0730  10/08/14 0437 10/08/14 1409 10/08/14 2352 10/09/14 0428 10/09/14 0502  BUN  --  33*  < > 39* 41*  41* 45* 47* 45*  CREATININE  --  SEE COMMENTS  < > SEE COMMENTS SEE COMMENT  SEE COMMENT SEE COMMENTS SEE COMMENTS SEE COMMENTS  NA  --  137  < > 136 137  137 137 136 136  K  --  3.9  < > 3.8 4.1  4.1 4.3 4.6 4.6  MG 1.9  --   --  2.0  --   --  2.1  --   PHOS  --  SEE COMMENTS  --  SEE COMMENT SEE COMMENT  --   --   --   < > = values in this interval not displayed. Glucose Profile:  Recent Labs  10/09/14 0749 10/09/14 0859 10/09/14 1018  GLUCAP 176* 168* 162*   Meds: levophed, insulin gtt, fentanyl Physical Findings: pt remains on vent  Height:  Ht Readings from Last 1 Encounters:  09/23/2014 5\' 1"  (1.549 m)    Weight:  Wt Readings from Last 1 Encounters:  10/09/14 204 lb 5.9 oz (92.7 kg)    Ideal Body Weight:     Wt Readings from Last 10 Encounters:  10/09/14 204 lb 5.9 oz (92.7 kg)  09/20/14 216 lb 4 oz (98.09 kg)  09/13/14 210 lb 15.7 oz (95.7 kg)   07/06/14 216 lb 12.8 oz (98.34 kg)  06/24/14 216 lb 8 oz (98.204 kg)  06/07/14 209 lb 12.8 oz (95.165 kg)  03/16/14 194 lb 8 oz (88.225 kg)  01/14/14 197 lb 12 oz (89.699 kg)  01/11/14 193 lb 12 oz (87.884 kg)  09/28/13 203 lb (92.08 kg)    BMI:  Body mass index is 38.63 kg/(m^2).  Estimated Nutritional Needs:  Kcal:  (11-14 kcals/d) Using actual wt of 95kg) 1050-1330 kcals/d  Protein:  (2.0-2.5 g/d) Using IbW of 48kg 96-120 g/d  Fluid:  (25-86ml/kg) Using IBW of 48kg 1200-1472ml/d  Skin:  Reviewed, no issues  Diet Order:  Diet NPO time specified .TPN (CLINIMIX-E) Adult  EDUCATION NEEDS:  No education needs identified at this time   Intake/Output Summary (Last 24 hours) at 10/09/14 1123 Last data filed at 10/09/14 0800  Gross per 24 hour  Intake 2660.62 ml  Output   3317 ml  Net -656.38 ml    Last BM:  6/1  Roda Shutters, RDN Pager: [phone] Office: [place]

## 2014-10-09 NOTE — Progress Notes (Signed)
Patient noted to have green, gastric in appearance secretions when performing oral care.  OG tube placed to LIS with immediate 918ml drained yellow-green in color.  Dr Lavetta Nielsen notified of findings.  Order to keep OG to LIS.

## 2014-10-09 NOTE — Progress Notes (Signed)
[facility] [place] NAME: [name]    MR#:  [id number]  DATE OF BIRTH:  March 19, 1951  SUBJECTIVE:  CHIEF COMPLAINT:   Chief Complaint  Patient presents with  . Altered Mental Status    Critically ill. On 2 pressors. CRRT. Awake but not following commands. Melanotic stool earlier.  REVIEW OF SYSTEMS:    ROS  Unable to give history due to encephalopathy patient intubated  EGD showed SEVERE esophagitis  DRUG ALLERGIES:  No Known Allergies  VITALS:  Blood pressure 103/64, pulse 101, temperature 97.3 F (36.3 C), temperature source Axillary, resp. rate 9, height 5\' 1"  (1.549 m), weight 92.7 kg (204 lb 5.9 oz), SpO2 95 %.  PHYSICAL EXAMINATION:   Physical Exam  GENERAL:  64 y.o.-year-old patient lying in the bed critically ill EYES: Pupils equal, round, reactive to light and accommodation. + scleral icterus.  HEENT: Head atraumatic, normocephalic.Intubated NECK:  Supple, no jugular venous distention. No thyroid enlargement, no tenderness. Right IJ . Right femoral Dialysis catheter. LUNGS: coarse breath sounds, poor air movement, vent CARDIOVASCULAR: S1, S2 normal. No murmurs,  + rub, - gallops. ABDOMEN: Soft, nontender, nondistended. Bowel sounds decreased. No organomegaly or mass. EXTREMITIES: No cyanosis, clubbing. Anasarca.  NEUROLOGIC:  Opens eyes,  Does not follow commands PSYCHIATRIC: The patient is drowzy. SKIN:  Superficial ulcer right thigh.  LABORATORY PANEL:   CBC  Recent Labs Lab 10/09/14 0428  WBC 25.8*  HGB 9.1*  HCT 27.4*  PLT 72*   ------------------------------------------------------------------------------------------------------------------  Chemistries   Recent Labs Lab 10/08/14 1409  10/09/14 0428 10/09/14 0502  NA 137  137  < > 136 136  K 4.1  4.1  < > 4.6 4.6  CL 103  104  < > 104 103  CO2 27  26  < > 26 25  GLUCOSE 183*  196*  < > 178* 174*  BUN 41*  41*  < > 47* 45*   CREATININE SEE COMMENT  SEE COMMENT  < > SEE COMMENTS SEE COMMENTS  CALCIUM 9.4  9.3  < > 9.1 9.0  MG  --   --  2.1  --   AST 85*  --   --   --   ALT 33  --   --   --   ALKPHOS 702*  --   --   --   BILITOT 31.4*  --   --   --   < > = values in this interval not displayed. ------------------------------------------------------------------------------------------------------------------  Cardiac Enzymes No results for input(s): TROPONINI in the last 168 hours. ------------------------------------------------------------------------------------------------------------------  RADIOLOGY:  Dg Chest Port 1 View  10/07/2014   IMPRESSION: 1. Worsened bilateral airspace opacities. In the context of the patient's moderate enlargement of the cardiopericardial silhouette, the appearance is suspicious for acute pulmonary edema. 2. The endotracheal tube has been successfully retracted, and the tubes and lines appear satisfactorily positioned.   Electronically Signed   By: Van Clines M.D.   On: 10/07/2014 08:42     ASSESSMENT AND PLAN:   64 year old African American female history of nephrotic syndrome, amyloidosis, essential hypertension presenting on 5/25 with altered mental status/confusion after apparently falling out of bed. Admitted for septic shock  * Septic shock with right flank/thigh cellulitis with streptococcus bacteremia - ID following - Changed to Levaquin and clindamycin . Continue pressors. Off steroids. - Echo- No vegetations. May need TEE if repeat cx positive. But would be high risk for complications. - Repeat  blood cultures negative to date  * Acute renal failure over CKD3 due to ATN. From septic shock in the setting of amyloidosis On CRRT. Appreciate nephrology help. Severe hypoalbuminemia. Getting IV albumin. Will likely progress to ESRD.  * Acute liver failure- Due to sepsis Ct abd showed no biliary dilation or obstruction Severe hyperbilirubinemia > 30.  Gastroenterology following  lactulose  * Elevated INR Due to liver failure. DIC unlikely per oncology, recheck smear, fibrinogen today 4 units FFP given 6/1 minimal improvement in INR  * Acute encephalopathy - Due to acute illness. CT head nothing acute.  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 Plan was for chemo as OP with Dr. Grayland Ormond but now on hold due to acute illness.. Oncology following  * Upper GI bleed with Acute blood loss  anemia over AOCD EGD showed severe esophagitis. On protonix 80 mg IV every 12. Transfused 4 units this admission.  * Type 2 diabetes, uncomplicated: Hold oral agents, at insulin slide scale every 6 hours Accu-Chek  * Essential hypertension: Held all meds  * Venous thromboembolism prophylactic: Heparin subcutaneous stopped due to high INR and low platelets and GIB  * Nutrition TFs  * Acute respiratory failure -Full vent support - due to suspected aspiration of gastrointestinal content - Pulmonology following - Full ventilator support  CODE STATUS: FULL CODE  Appreciate palliative care following. Very poor prognosis with no improvement inspite of aggressive care. Multi organ failure. High risk for cardiac arrest and death   TOTAL CRITICAL CARE TIME TAKING CARE OF THIS PATIENT: 35 minutes.    Hillary Bow R M.D on 10/09/2014 at 10:54 AM  Between 7am to 6pm - Pager - [phone]  After 6pm go to [email or web address] - password [email or web address] [facility]  [facility]  Office  [phone]  CC: Primary care physician; [name], MD

## 2014-10-09 NOTE — Consult Note (Signed)
PULMONARY / CRITICAL CARE MEDICINE   Name: [name] MRN: [id number] DOB: [birth year]    ADMISSION DATE:  09/19/2014    CHIEF COMPLAINT:   resp failure   SIGNIFICANT EVENTS    Patient remains intubated,sedated. On full vent support, on multiple vasopressors,  on CRRT, patient with liver,renal and resp failure, prognosis is very poor  Plan for sedation vacation, kidney function very poor. Patient delirious, not tolerating TF's  S/p EGD shows erosive and severe esophagitis  Patient has failed SAT/SBt last several days due to resp muscle fatigue and encephlopathy    PAST MEDICAL HISTORY    :  Past Medical History  Diagnosis Date  . HTN (hypertension)   . Diabetes mellitus type II   . Obesity   . Arthritis   . Chronic kidney disease   . Amyloidosis    Past Surgical History  Procedure Laterality Date  . Total abdominal hysterectomy  10/1999    fibroids  . Combined abdominoplasty and liposuction  07/2000  . Total knee arthroplasty Right 09/28/2013    Procedure: RIGHT TOTAL KNEE ARTHROPLASTY;  Surgeon: Mauri Pole, MD;  Location: [facility];  Service: Orthopedics;  Laterality: Right;   Prior to Admission medications   Medication Sig Start Date End Date Taking? Authorizing Provider  metolazone (ZAROXOLYN) 5 MG tablet Take 5 mg by mouth daily.   Yes Historical Provider, MD  midodrine (PROAMATINE) 10 MG tablet Take 5 mg by mouth 3 (three) times daily.    Yes Historical Provider, MD  polyethylene glycol powder (GLYCOLAX/MIRALAX) powder Take 17 g by mouth daily as needed for moderate constipation. 09/20/14  Yes Abner Greenspan, MD  potassium chloride SA (K-DUR,KLOR-CON) 20 MEQ tablet Take 1 tablet (20 mEq total) by mouth daily. 07/25/14  Yes Abner Greenspan, MD  torsemide (DEMADEX) 20 MG tablet Take 20 mg by mouth 2 (two) times daily as needed (swelling).    Yes Historical Provider, MD   No Known Allergies   FAMILY HISTORY   Family History  Problem Relation Age of  Onset  . Arthritis Mother   . Hypertension Mother   . Cancer Mother     breast, lung  . Diabetes Mother   . Hypertension Father       SOCIAL HISTORY    reports that she has never smoked. She has never used smokeless tobacco. She reports that she does not drink alcohol or use illicit drugs.  Review of Systems  Unable to perform ROS: critical illness      VITAL SIGNS    Temp:  [97.3 F (36.3 C)-97.7 F (36.5 C)] 97.3 F (36.3 C) (06/05 0400) Pulse Rate:  [91-101] 101 (06/05 0600) Resp:  [7-12] 9 (06/05 0600) BP: (83-106)/(53-66) 103/64 mmHg (06/05 0600) SpO2:  [90 %-95 %] 95 % (06/05 0600) FiO2 (%):  [35 %] 35 % (06/05 0820) Weight:  [204 lb 5.9 oz (92.7 kg)] 204 lb 5.9 oz (92.7 kg) (06/05 0500) HEMODYNAMICS:   VENTILATOR SETTINGS: Vent Mode:  [-] Spontaneous FiO2 (%):  [35 %] 35 % PEEP:  [5 cmH20] 5 cmH20 Pressure Support:  [10 cmH20-15 cmH20] 15 cmH20 INTAKE / OUTPUT:  Intake/Output Summary (Last 24 hours) at 10/09/14 0957 Last data filed at 10/09/14 0800  Gross per 24 hour  Intake 2918.62 ml  Output   3487 ml  Net -568.38 ml       PHYSICAL EXAM   Physical Exam  Constitutional: She appears distressed.  HENT:  Head: Normocephalic and atraumatic.  Eyes: Pupils are equal, round, and reactive to light. No scleral icterus.  Neck: Normal range of motion. Neck supple.  Cardiovascular: Normal rate and regular rhythm.   No murmur heard. Pulmonary/Chest: No respiratory distress. She has no wheezes. She has rales.  resp distress  Abdominal: Soft. She exhibits no distension. There is no tenderness.  Musculoskeletal: She exhibits no edema.  Neurological: She displays normal reflexes. Coordination normal.  gcs<8T  Skin: Skin is warm. No rash noted. She is diaphoretic.       LABS   LABS:  CBC  Recent Labs Lab 10/07/14 0458 10/07/14 1927 10/08/14 0437 10/09/14 0428  WBC 23.4*  --  26.5* 25.8*  HGB 6.8* 8.8* 9.0* 9.1*  HCT 21.2* 25.9* 26.5*  27.4*  PLT 63*  --  75* 72*   Coag's  Recent Labs Lab 10/05/14 0614 10/15/2014 0538  10/07/14 1120 10/08/14 0437 10/09/14 0428  APTT 50* 42*  < > 41* 43* 46*  INR 3.00 2.25  --  2.03  --   --   < > = values in this interval not displayed. BMET  Recent Labs Lab 10/08/14 2352 10/09/14 0428 10/09/14 0502  NA 137 136 136  K 4.3 4.6 4.6  CL 105 104 103  CO2 26 26 25   BUN 45* 47* 45*  CREATININE SEE COMMENTS SEE COMMENTS SEE COMMENTS  GLUCOSE 181* 178* 174*   Electrolytes  Recent Labs Lab 10/07/14 0458 10/07/14 0730  10/08/14 0437 10/08/14 1409 10/08/14 2352 10/09/14 0428 10/09/14 0502  CALCIUM  --  8.8*  < > 9.0 9.4  9.3 9.3 9.1 9.0  MG 1.9  --   --  2.0  --   --  2.1  --   PHOS  --  SEE COMMENTS  --  SEE COMMENT SEE COMMENT  --   --   --   < > = values in this interval not displayed. Sepsis Markers No results for input(s): LATICACIDVEN, PROCALCITON, O2SATVEN in the last 168 hours. ABG  Recent Labs Lab 10/04/14 0510 10/23/2014 1045 10/07/14 1125  PHART 7.41 7.44 7.34*  PCO2ART 44 44 51*  PO2ART 59* 66* 68*   Liver Enzymes  Recent Labs Lab 10/24/2014 0140  10/07/14 0730  10/08/14 0658 10/08/14 1409 10/08/14 2352 10/09/14 0502  AST 55*  --   --   --  81* 85*  --   --   ALT 28  --   --   --  30 33  --   --   ALKPHOS 351*  --   --   --  594* 702*  --   --   BILITOT 23.9*  --  30.0*  --  31.4* 31.4*  --   --   ALBUMIN 4.0  < > 3.5  < > 3.1* 3.0*  2.9* 3.2* 3.0*  < > = values in this interval not displayed. Cardiac Enzymes No results for input(s): TROPONINI, PROBNP in the last 168 hours. Glucose  Recent Labs Lab 10/09/14 0343 10/09/14 0443 10/09/14 0547 10/09/14 0651 10/09/14 0721 10/09/14 0749  GLUCAP 165* 159* 177* 172* 185* 176*     Recent Results (from the past 240 hour(s))  Culture, blood (routine x 2)     Status: None (Preliminary result)   Collection Time: 10/04/14 11:50 AM  Result Value Ref Range Status   Specimen Description  BLOOD  Final   Special Requests NONE  Final   Culture NO GROWTH 4 DAYS  Final   Report Status PENDING  Incomplete  Culture, blood (routine x 2)     Status: None (Preliminary result)   Collection Time: 10/04/14 12:00 PM  Result Value Ref Range Status   Specimen Description BLOOD  Final   Special Requests NONE  Final   Culture NO GROWTH 4 DAYS  Final   Report Status PENDING  Incomplete  Culture, respiratory (NON-Expectorated)     Status: None   Collection Time: 10/04/14  2:00 PM  Result Value Ref Range Status   Specimen Description INDUCED SPUTUM  Final   Special Requests Normal  Final   Gram Stain   Final    EXCELLENT SPECIMEN - 90-100% WBCS MANY WBC SEEN MANY YEAST    Culture MODERATE GROWTH CANDIDA ALBICANS  Final   Report Status 10/07/2014 FINAL  Final     Current facility-administered medications:  .  Marland KitchenTPN (CLINIMIX-E) Adult, , Intravenous, Continuous TPN, Flora Lipps, MD, Last Rate: 70 mL/hr at 10/09/14 0300 .  acetaminophen (TYLENOL) tablet 650 mg, 650 mg, Oral, Q6H PRN **OR** acetaminophen (TYLENOL) suppository 650 mg, 650 mg, Rectal, Q6H PRN, Lytle Butte, MD, 650 mg at 10/03/14 1030 .  albuterol (PROVENTIL) (2.5 MG/3ML) 0.083% nebulizer solution 2.5 mg, 2.5 mg, Nebulization, Q6H PRN, Juluis Mire, MD, 2.5 mg at 10/03/14 0816 .  antiseptic oral rinse (CPC / CETYLPYRIDINIUM CHLORIDE 0.05%) solution 7 mL, 7 mL, Mouth Rinse, QID, Herbon London Pepper, MD, 7 mL at 10/09/14 0345 .  budesonide (PULMICORT) nebulizer solution 0.5 mg, 0.5 mg, Nebulization, BID, Flora Lipps, MD, 0.5 mg at 10/09/14 0819 .  chlorhexidine (PERIDEX) 0.12 % solution 15 mL, 15 mL, Mouth Rinse, BID, Erby Pian, MD, 15 mL at 10/09/14 0757 .  clindamycin (CLEOCIN) IVPB 600 mg, 600 mg, Intravenous, 3 times per day, Adrian Prows, MD, 600 mg at 10/09/14 0558 .  fentaNYL 2555mcg in NS 261mL (82mcg/ml) infusion-PREMIX, 10 mcg/hr, Intravenous, Continuous, Srikar Sudini, MD, Last Rate: 40 mL/hr at 10/09/14  0800, 400 mcg/hr at 10/09/14 0800 .  heparin injection 1,000-6,000 Units, 1,000-6,000 Units, CRRT, PRN, Murlean Iba, MD, 1,000 Units at 10/01/14 1435 .  insulin regular (NOVOLIN R,HUMULIN R) 250 Units in sodium chloride 0.9 % 250 mL (1 Units/mL) infusion, , Intravenous, Continuous, Flora Lipps, MD, Last Rate: 2.3 mL/hr at 10/09/14 0800 .  ipratropium-albuterol (DUONEB) 0.5-2.5 (3) MG/3ML nebulizer solution 3 mL, 3 mL, Nebulization, Q4H, Flora Lipps, MD, 3 mL at 10/09/14 0819 .  lactulose (CHRONULAC) 10 GM/15ML solution 20 g, 20 g, Oral, BID, Josefine Class, MD, 20 g at 10/08/14 2141 .  levofloxacin (LEVAQUIN) IVPB 500 mg, 500 mg, Intravenous, Q24H, Adrian Prows, MD, 500 mg at 10/08/14 1744 .  midodrine (PROAMATINE) tablet 10 mg, 10 mg, Oral, TID, Lytle Butte, MD, 10 mg at 10/08/14 1905 .  norepinephrine (LEVOPHED) 16 mg in dextrose 5 % 250 mL (0.064 mg/mL) infusion, 0-40 mcg/min, Intravenous, Titrated, Srikar Sudini, MD, Last Rate: 9.4 mL/hr at 10/09/14 0800, 10.027 mcg/min at 10/09/14 0800 .  pantoprazole (PROTONIX) injection 40 mg, 40 mg, Intravenous, Q12H, Flora Lipps, MD, 40 mg at 10/08/14 2141 .  polyethylene glycol powder (GLYCOLAX/MIRALAX) container 17 g, 17 g, Oral, Daily PRN, Lytle Butte, MD .  pureflow IV solution for Dialysis, , CRRT, Continuous, Munsoor Lateef, MD, Last Rate: 2,000 mL/hr at 10/08/14 2119 .  sodium chloride 0.9 % injection 10-40 mL, 10-40 mL, Intracatheter, Q12H, Srikar Sudini, MD, 10 mL at 10/08/14 2141 .  sodium chloride 0.9 % injection 10-40 mL, 10-40 mL, Intracatheter, PRN, Srikar  Sudini, MD .  sucralfate (CARAFATE) 1 GM/10ML suspension 1 g, 1 g, Oral, 4 times per day, Josefine Class, MD, 1 g at 10/09/14 0558 .  vasopressin (PITRESSIN) 40 Units in sodium chloride 0.9 % 250 mL (0.16 Units/mL) infusion, 0.03 Units/min, Intravenous, Continuous, Lytle Butte, MD, Last Rate: 11.3 mL/hr at 10/09/14 0800, 0.03 Units/min at 10/09/14 0800 .  vecuronium  (NORCURON) injection 10 mg, 10 mg, Intravenous, Once, Flora Lipps, MD, 10 mg at 10/12/2014 1243  IMAGING    No results found.    Indwelling Urinary Catheter continued, requirement due to   Reason to continue Indwelling Urinary Catheter for strict Intake/Output monitoring for hemodynamic instability   Central Line continued, requirement due to   Reason to continue [employer] Monitoring of central venous pressure or other hemodynamic parameters   Ventilator continued, requirement due to, resp failure    Ventilator Sedation RASS 0 to -2      ASSESSMENT/PLAN   64 yo AAF admitted to ICU for acute septic shock and progressive resp failure with renal failure Patient with underlying Amyloidosis with progressive multiorgan failure   PULMONARY-Respiratory Failure-failed SAT/SBT due to resp muscle fatigue and encephlopathy -continue Full MV support -continue Bronchodilator Therapy -Wean Fio2 and PEEP as tolerated  CARDIOVASCULAR -septic shock-use vasopressors to keep MAP>65 -ABX as prescribed -stress dose steroids completed  RENAL-no urine output-at this time, patient not likely to recover kidney function -progressive renal failure from ATN -on CRRT -follow up Nephrology recs  GASTROINTESTINAL-GIB-severe erosive esophagitis, showing signs of intolerance to Tf's -bowel rest for now, continue TPN -liver failure-worsening -follow up Gi recs   HEMATOLOGIC -follow H/h s/p transfusion -follow INR s/p FFP  INFECTIOUS- -streptococcus bacteremia from cellulitis -on abx -repeat blood cultures no growth -follow up ID recs   ENDOCRINE -follow FSBS -on insulin drip  NEUROLOGIC -wean sedation and assess neuro status  NUTRITION-on TPN  I have personally obtained a history, examined the patient, evaluated laboratory and imaging results, formulated the assessment and plan and placed orders.  The Patient requires high complexity decision making for assessment and support,  frequent evaluation and titration of therapies, application of advanced monitoring technologies and extensive interpretation of multiple databases. Critical Care Time devoted to patient care services described in this note is 45 minutes.   Overall, patient is critically ill, prognosis is very poor/guarded. Patient at high risk for cardiac arrest and death.   Palliative care team consulted-recommend DNR status, recommend comfort care measures  Corrin Parker, M.D. Pulmonary & [place] Director Intensive Care Unit   10/09/2014, 9:57 AM

## 2014-10-09 NOTE — Progress Notes (Addendum)
Pt remains sedated and intubated with CRRT running well. She opens eyes spont and follows some commands at times. Wound dressing changed and vas cath dressing changed. VSS at this time. Family updated

## 2014-10-09 NOTE — Progress Notes (Signed)
Subjective:  Remains critically ill. Still on CRRT. UF 225 cc/hr Obligate intake about 100 cc/hr Remains anuric. TPN 70 cc/hr Vasopressin Nor Epinephrine No other major changes  Objective:  Vital signs in last 24 hours:  Temp:  [97.3 F (36.3 C)-97.8 F (36.6 C)] 97.3 F (36.3 C) (06/05 0400) Pulse Rate:  [91-101] 101 (06/05 0600) Resp:  [7-12] 9 (06/05 0600) BP: (83-106)/(53-66) 103/64 mmHg (06/05 0600) SpO2:  [90 %-95 %] 95 % (06/05 0600) FiO2 (%):  [35 %] 35 % (06/05 0820) Weight:  [92.7 kg (204 lb 5.9 oz)] 92.7 kg (204 lb 5.9 oz) (06/05 0500)  Weight change: 0.9 kg (1 lb 15.7 oz) Filed Weights   10/07/14 0500 10/08/14 0500 10/09/14 0500  Weight: 93.7 kg (206 lb 9.1 oz) 91.8 kg (202 lb 6.1 oz) 92.7 kg (204 lb 5.9 oz)    Intake/Output: I/O last 3 completed shifts: In: 4586.2 [I.V.:1866.2; NG/GT:20; IV Piggyback:250] Out: 6160 [Urine:15; Emesis/NG output:470; Other:5865]     Physical Exam: General: Critically ill appearing  HEENT Scleral icterus noted, ETT in place  Neck supple  Pulm/lungs Decreased breath sounds at bases, vent assisted  CVS/Heart S1S2 no rubs  Abdomen:  Soft, distended, tense, decreased Bowel sounds  Extremities: 3+ anasarca  Neurologic: On the vent  Skin: Cools extremities  Access: Temp cath- rt femoral       Basic Metabolic Panel:  Recent Labs Lab 10/05/14 0614  11/03/2014 0538 10/30/2014 0651  11/02/2014 2338 10/07/14 0458 10/07/14 0730  10/08/14 0437 10/08/14 1409 10/08/14 2352 10/09/14 0428 10/09/14 0502  NA 139  < >  --  137  < > 137  --  137  < > 136 137  137 137 136 136  K 4.0  < >  --  3.9  < > 3.9  --  3.9  < > 3.8 4.1  4.1 4.3 4.6 4.6  CL 106  < >  --  104  < > 104  --  104  < > 105 103  104 105 104 103  CO2 28  < >  --  28  < > 28  --  27  < > 26 27  26 26 26 25   GLUCOSE 201*  < >  --  236*  < > 237*  --  235*  < > 146* 183*  196* 181* 178* 174*  BUN 17  < >  --  19  < > 29*  --  33*  < > 39* 41*  41* 45* 47* 45*   CREATININE <0.30*  < >  --  unable to report due to icterus  < > UNABLE TO REPORT DUE TO ICTERUS  --  SEE COMMENTS  < > SEE COMMENTS SEE COMMENT  SEE COMMENT SEE COMMENTS SEE COMMENTS SEE COMMENTS  CALCIUM 9.0  < >  --  9.0  < > 9.1  --  8.8*  < > 9.0 9.4  9.3 9.3 9.1 9.0  MG 1.6*  --  1.8  --   --   --  1.9  --   --  2.0  --   --  2.1  --   PHOS SEE COMMENTS  --   --  SEE COMMENTS  --  UNABLE TO REPORT DUE TO ICTERUS  --  SEE COMMENTS  --  SEE COMMENT SEE COMMENT  --   --   --   < > = values in this interval not displayed.   CBC:  Recent  Labs Lab 10/02/14 1030  10/05/14 7622 10/25/2014 0538 10/07/14 0458 10/07/14 1927 10/08/14 0437 10/09/14 0428  WBC 24.6*  < > 20.3* 21.2* 23.4*  --  26.5* 25.8*  NEUTROABS 21.1*  --   --   --   --   --   --   --   HGB 5.9*  < > 6.3* 7.1* 6.8* 8.8* 9.0* 9.1*  HCT 18.6*  < > 18.5* 20.7* 21.2* 25.9* 26.5* 27.4*  MCV 88.6  < > 91.5 87.2 87.9  --  90.2 91.9  PLT 68*  < > 43* 55* 63*  --  75* 72*  < > = values in this interval not displayed.    Microbiology: Results for orders placed or performed during the hospital encounter of 09/07/2014  Blood culture (routine x 2)     Status: None   Collection Time: 09/30/2014 12:45 AM  Result Value Ref Range Status   Specimen Description BLOOD  Final   Special Requests NONE  Final   Culture  Setup Time   Final    GRAM POSITIVE COCCI IN CHAINS IN BOTH AEROBIC AND ANAEROBIC BOTTLES CRITICAL RESULT CALLED TO, READ BACK BY AND VERIFIED WITH: PAM CRAWFORD AT 1406 09/15/2014 DV    Culture STREPTOCOCCUS SPECIES  Final   Report Status 10/01/2014 FINAL  Final   Organism ID, Bacteria STREPTOCOCCUS SPECIES  Final      Susceptibility   Streptococcus species - MIC (ETEST)*    ERYTHROMYCIN Value in next row Resistant      RESISTANT6    PENICILLIN Value in next row Intermediate      INTERMEDIATE0.25    CEFTRIAXONE Value in next row Sensitive      SENSITIVE0.047    VANCOMYCIN Value in next row Sensitive       SENSITIVE1.0    LEVOFLOXACIN Value in next row Sensitive      SENSITIVE1.0    * STREPTOCOCCUS SPECIES  Blood culture (routine x 2)     Status: None   Collection Time: 09/24/2014 12:45 AM  Result Value Ref Range Status   Specimen Description BLOOD  Final   Special Requests NONE  Final   Culture  Setup Time   Final    GRAM POSITIVE COCCI IN CHAINS IN BOTH AEROBIC AND ANAEROBIC BOTTLES CRITICAL RESULT CALLED TO, READ BACK BY AND VERIFIED WITH: PAM CRAWFORD AT 1406 09/16/2014 DV    Culture   Final    STREPTOCOCCUS SPECIES REFER TO ACCESSION [id number] COLLECTED ON 09/08/2014 AT 0045 FOR SENSITIVITIES    Report Status 09/30/2014 FINAL  Final  Culture, blood (routine x 2)     Status: None (Preliminary result)   Collection Time: 10/04/14 11:50 AM  Result Value Ref Range Status   Specimen Description BLOOD  Final   Special Requests NONE  Final   Culture NO GROWTH 4 DAYS  Final   Report Status PENDING  Incomplete  Culture, blood (routine x 2)     Status: None (Preliminary result)   Collection Time: 10/04/14 12:00 PM  Result Value Ref Range Status   Specimen Description BLOOD  Final   Special Requests NONE  Final   Culture NO GROWTH 4 DAYS  Final   Report Status PENDING  Incomplete  Culture, respiratory (NON-Expectorated)     Status: None   Collection Time: 10/04/14  2:00 PM  Result Value Ref Range Status   Specimen Description INDUCED SPUTUM  Final   Special Requests Normal  Final   Gram Stain  Final    EXCELLENT SPECIMEN - 90-100% WBCS MANY WBC SEEN MANY YEAST    Culture MODERATE GROWTH CANDIDA ALBICANS  Final   Report Status 10/07/2014 FINAL  Final    Coagulation Studies:  Recent Labs  10/07/14 1120  LABPROT 23.1*  INR 2.03    Urinalysis: No results for input(s): COLORURINE, LABSPEC, PHURINE, GLUCOSEU, HGBUR, BILIRUBINUR, KETONESUR, PROTEINUR, UROBILINOGEN, NITRITE, LEUKOCYTESUR in the last 72 hours.  Invalid input(s): APPERANCEUR    Imaging: No results  found.   Medications:   . Marland KitchenTPN (CLINIMIX-E) Adult 70 mL/hr at 10/09/14 0300  . fentaNYL infusion INTRAVENOUS 400 mcg/hr (10/09/14 0800)  . insulin (NOVOLIN-R) infusion 2.3 mL/hr at 10/09/14 0800  . norepinephrine (LEVOPHED) Adult infusion 10.027 mcg/min (10/09/14 0800)  . pureflow 2,000 mL/hr at 10/08/14 2119  . vasopressin (PITRESSIN) infusion - *FOR SHOCK* 0.03 Units/min (10/09/14 0800)   . antiseptic oral rinse  7 mL Mouth Rinse QID  . budesonide (PULMICORT) nebulizer solution  0.5 mg Nebulization BID  . chlorhexidine  15 mL Mouth Rinse BID  . clindamycin (CLEOCIN) IV  600 mg Intravenous 3 times per day  . ipratropium-albuterol  3 mL Nebulization Q4H  . lactulose  20 g Oral BID  . levofloxacin (LEVAQUIN) IV  500 mg Intravenous Q24H  . midodrine  10 mg Oral TID  . pantoprazole (PROTONIX) IV  40 mg Intravenous Q12H  . sodium chloride  10-40 mL Intracatheter Q12H  . sucralfate  1 g Oral 4 times per day  . vecuronium  10 mg Intravenous Once   acetaminophen **OR** acetaminophen, albuterol, heparin, polyethylene glycol powder, sodium chloride  Assessment/ Plan:   64 y.o. African [place] female with diabetes mellitus type 2, hypertension, morbid obesity, osteoarthritis, status post right knee surgery, nephrotic syndrome with edema, hypoalbuminemia, renal insufficiency and proteinuria.  1. ARF with CKD st 3 . Baseline cr 1.3. (08/2014)  2. Nephrotic Syndrome with proteinuria: Renal Amyloidosis.  3. Generalized Edema 4. Hypoalbuminemia 5. Right groin cellulitis /sepsis- strep species  6. Diabetes Mellitus type II 7. Hypotension  Plan: Patient remains essentially anuric at this point in time. She has tolerated CRRT well however. Has abdominal compartment syndrome, with bladder pressure of 23-24 cm yesterday AM Family has not made a decision regarding her overall disposition at this time. Therefore we will continue the patient on CRRT with the current parameters. Palliative care  is involved in the case and is having ongoing discussions. The patient's kidney disease is likely to progress to end-stage renal disease. Overall prognosis quite poor. Last albumin 3.0 Increase UF to 250 cc/hr  LOS: 11 [name] 6/5/20168:40 AM

## 2014-10-09 NOTE — Progress Notes (Signed)
[facility] NOTE  Pharmacy Consult for Electrolyte and CRRT medication management Indication: ICU Status, CRRT, TPN   No Known Allergies  Patient Measurements: Height: 5\' 1"  (154.9 cm) Weight: 204 lb 5.9 oz (92.7 kg) IBW/kg (Calculated) : 47.8   Vital Signs: Temp: 97.3 F (36.3 C) (06/05 0400) Temp Source: Axillary (06/05 0000) BP: 103/64 mmHg (06/05 0600) Pulse Rate: 101 (06/05 0600) Intake/Output from previous day: 06/04 0701 - 06/05 0700 In: 3039.4 [I.V.:1179.4; IV Piggyback:250; TPN:1610] Out: 3826  Intake/Output from this shift: Total I/O In: 133 [I.V.:63; TPN:70] Out: -  Vent settings for last 24 hours: Vent Mode:  [-] Spontaneous FiO2 (%):  [35 %] 35 % PEEP:  [5 cmH20] 5 cmH20 Pressure Support:  [10 cmH20-15 cmH20] 15 cmH20  Labs:  Recent Labs  10/07/14 0458 10/07/14 0730 10/07/14 1120  10/07/14 1927  10/08/14 0437 10/08/14 0658 10/08/14 1409 10/08/14 2352 10/09/14 0428 10/09/14 0502  WBC 23.4*  --   --   --   --   --  26.5*  --   --   --  25.8*  --   HGB 6.8*  --   --   --  8.8*  --  9.0*  --   --   --  9.1*  --   HCT 21.2*  --   --   --  25.9*  --  26.5*  --   --   --  27.4*  --   PLT 63*  --   --   --   --   --  75*  --   --   --  72*  --   APTT 41*  --  41*  --   --   --  43*  --   --   --  46*  --   INR  --   --  2.03  --   --   --   --   --   --   --   --   --   CREATININE  --  SEE COMMENTS  --   < > SEE COMMENTS  < > SEE COMMENTS  --  SEE COMMENT  SEE COMMENT SEE COMMENTS SEE COMMENTS SEE COMMENTS  MG 1.9  --   --   --   --   --  2.0  --   --   --  2.1  --   PHOS  --  SEE COMMENTS  --   --   --   --  SEE COMMENT  --  SEE COMMENT  --   --   --   ALBUMIN  --  3.5  --   < > 3.4*  < >  --  3.1* 3.0*  2.9* 3.2*  --  3.0*  PROT  --   --   --   --   --   --   --  6.7 6.9  --   --   --   AST  --   --   --   --   --   --   --  81* 85*  --   --   --   ALT  --   --   --   --   --   --   --  30 33  --   --   --   ALKPHOS  --   --    --   --   --   --   --  594* 702*  --   --   --   BILITOT  --  30.0*  --   --   --   --   --  31.4* 31.4*  --   --   --   BILIDIR  --   --   --   --   --   --   --  22.6*  --   --   --   --   IBILI  --   --   --   --   --   --   --  8.8*  --   --   --   --   < > = values in this interval not displayed. CrCl cannot be calculated (Patient has no serum creatinine result on file.).   Recent Labs  10/09/14 0651 10/09/14 0721 10/09/14 0749  GLUCAP 172* 185* 176*    Microbiology: Recent Results (from the past 720 hour(s))  Blood culture (routine x 2)     Status: None   Collection Time: 09/30/2014 12:45 AM  Result Value Ref Range Status   Specimen Description BLOOD  Final   Special Requests NONE  Final   Culture  Setup Time   Final    GRAM POSITIVE COCCI IN CHAINS IN BOTH AEROBIC AND ANAEROBIC BOTTLES CRITICAL RESULT CALLED TO, READ BACK BY AND VERIFIED WITH: PAM CRAWFORD AT 1406 09/20/2014 DV    Culture STREPTOCOCCUS SPECIES  Final   Report Status 10/01/2014 FINAL  Final   Organism ID, Bacteria STREPTOCOCCUS SPECIES  Final      Susceptibility   Streptococcus species - MIC (ETEST)*    ERYTHROMYCIN Value in next row Resistant      RESISTANT6    PENICILLIN Value in next row Intermediate      INTERMEDIATE0.25    CEFTRIAXONE Value in next row Sensitive      SENSITIVE0.047    VANCOMYCIN Value in next row Sensitive      SENSITIVE1.0    LEVOFLOXACIN Value in next row Sensitive      SENSITIVE1.0    * STREPTOCOCCUS SPECIES  Blood culture (routine x 2)     Status: None   Collection Time: 09/06/2014 12:45 AM  Result Value Ref Range Status   Specimen Description BLOOD  Final   Special Requests NONE  Final   Culture  Setup Time   Final    GRAM POSITIVE COCCI IN CHAINS IN BOTH AEROBIC AND ANAEROBIC BOTTLES CRITICAL RESULT CALLED TO, READ BACK BY AND VERIFIED WITH: PAM CRAWFORD AT 1406 09/08/2014 DV    Culture   Final    STREPTOCOCCUS SPECIES REFER TO ACCESSION [id number] COLLECTED ON 09/27/2014 AT  0045 FOR SENSITIVITIES    Report Status 09/30/2014 FINAL  Final  Culture, blood (routine x 2)     Status: None (Preliminary result)   Collection Time: 10/04/14 11:50 AM  Result Value Ref Range Status   Specimen Description BLOOD  Final   Special Requests NONE  Final   Culture NO GROWTH 4 DAYS  Final   Report Status PENDING  Incomplete  Culture, blood (routine x 2)     Status: None (Preliminary result)   Collection Time: 10/04/14 12:00 PM  Result Value Ref Range Status   Specimen Description BLOOD  Final   Special Requests NONE  Final   Culture NO GROWTH 4 DAYS  Final   Report Status PENDING  Incomplete  Culture, respiratory (NON-Expectorated)     Status: None   Collection Time:  10/04/14  2:00 PM  Result Value Ref Range Status   Specimen Description INDUCED SPUTUM  Final   Special Requests Normal  Final   Gram Stain   Final    EXCELLENT SPECIMEN - 90-100% WBCS MANY WBC SEEN MANY YEAST    Culture MODERATE GROWTH CANDIDA ALBICANS  Final   Report Status 10/07/2014 FINAL  Final    Medications:  Scheduled:  . antiseptic oral rinse  7 mL Mouth Rinse QID  . budesonide (PULMICORT) nebulizer solution  0.5 mg Nebulization BID  . chlorhexidine  15 mL Mouth Rinse BID  . clindamycin (CLEOCIN) IV  600 mg Intravenous 3 times per day  . ipratropium-albuterol  3 mL Nebulization Q4H  . lactulose  20 g Oral BID  . levofloxacin (LEVAQUIN) IV  500 mg Intravenous Q24H  . midodrine  10 mg Oral TID  . pantoprazole (PROTONIX) IV  40 mg Intravenous Q12H  . sodium chloride  10-40 mL Intracatheter Q12H  . sucralfate  1 g Oral 4 times per day  . vecuronium  10 mg Intravenous Once   Infusions:  . Marland KitchenTPN (CLINIMIX-E) Adult 70 mL/hr at 10/09/14 0300  . fentaNYL infusion INTRAVENOUS 400 mcg/hr (10/09/14 0800)  . insulin (NOVOLIN-R) infusion 2.3 mL/hr at 10/09/14 0800  . norepinephrine (LEVOPHED) Adult infusion 10.027 mcg/min (10/09/14 0800)  . pureflow 2,000 mL/hr at 10/08/14 2119  . vasopressin  (PITRESSIN) infusion - *FOR SHOCK* 0.03 Units/min (10/09/14 0800)   PRN: acetaminophen **OR** acetaminophen, albuterol, heparin, polyethylene glycol powder, sodium chloride  Assessment: 64 yo morbidly obese female with chronic trach admitted with respiratory failure requiring ventilator support.    Plan:   1. Electrolytes/TPN: Electolytes are WNL. Will obtain follow-up labs with am labs. Patient initiated on insulin drip. Will need to ensure that MVI and Trace are added to TPNs.   2. CRRT Medication adjustment: no further adjustments warranted at this time.   Pharmacy will continue to monitor and adjust per consult.     Joslyn Ramos K, RPH 10/09/2014,9:49 AM

## 2014-10-10 DIAGNOSIS — K7200 Acute and subacute hepatic failure without coma: Secondary | ICD-10-CM

## 2014-10-10 LAB — COMPREHENSIVE METABOLIC PANEL
ALT: 40 U/L (ref 14–54)
AST: 117 U/L — AB (ref 15–41)
Albumin: 2.7 g/dL — ABNORMAL LOW (ref 3.5–5.0)
Alkaline Phosphatase: 874 U/L — ABNORMAL HIGH (ref 38–126)
Anion gap: 7 (ref 5–15)
BUN: 53 mg/dL — AB (ref 6–20)
CALCIUM: 9 mg/dL (ref 8.9–10.3)
CHLORIDE: 103 mmol/L (ref 101–111)
CO2: 24 mmol/L (ref 22–32)
Glucose, Bld: 198 mg/dL — ABNORMAL HIGH (ref 65–99)
Potassium: 4.5 mmol/L (ref 3.5–5.1)
SODIUM: 134 mmol/L — AB (ref 135–145)
TOTAL PROTEIN: 6.7 g/dL (ref 6.5–8.1)
Total Bilirubin: 37.4 mg/dL (ref 0.3–1.2)

## 2014-10-10 LAB — RENAL FUNCTION PANEL
ALBUMIN: 2.7 g/dL — AB (ref 3.5–5.0)
ANION GAP: 7 (ref 5–15)
Albumin: 2.7 g/dL — ABNORMAL LOW (ref 3.5–5.0)
Albumin: 2.8 g/dL — ABNORMAL LOW (ref 3.5–5.0)
Anion gap: 6 (ref 5–15)
Anion gap: 6 (ref 5–15)
BUN: 53 mg/dL — ABNORMAL HIGH (ref 6–20)
BUN: 50 mg/dL — ABNORMAL HIGH (ref 6–20)
BUN: 46 mg/dL — ABNORMAL HIGH (ref 6–20)
CALCIUM: 9 mg/dL (ref 8.9–10.3)
CHLORIDE: 102 mmol/L (ref 101–111)
CHLORIDE: 102 mmol/L (ref 101–111)
CO2: 24 mmol/L (ref 22–32)
CO2: 25 mmol/L (ref 22–32)
CO2: 25 mmol/L (ref 22–32)
Calcium: 8.7 mg/dL — ABNORMAL LOW (ref 8.9–10.3)
Calcium: 9.1 mg/dL (ref 8.9–10.3)
Chloride: 103 mmol/L (ref 101–111)
Creatinine, Ser: UNDETERMINED mg/dL (ref 0.44–1.00)
GLUCOSE: 120 mg/dL — AB (ref 65–99)
Glucose, Bld: 224 mg/dL — ABNORMAL HIGH (ref 65–99)
Glucose, Bld: 171 mg/dL — ABNORMAL HIGH (ref 65–99)
POTASSIUM: 4.2 mmol/L (ref 3.5–5.1)
Phosphorus: UNDETERMINED mg/dL (ref 2.5–4.6)
Potassium: 4.5 mmol/L (ref 3.5–5.1)
Potassium: 4.4 mmol/L (ref 3.5–5.1)
SODIUM: 133 mmol/L — AB (ref 135–145)
SODIUM: 135 mmol/L (ref 135–145)
Sodium: 132 mmol/L — ABNORMAL LOW (ref 135–145)

## 2014-10-10 LAB — GLUCOSE, CAPILLARY
GLUCOSE-CAPILLARY: 144 mg/dL — AB (ref 65–99)
GLUCOSE-CAPILLARY: 151 mg/dL — AB (ref 65–99)
GLUCOSE-CAPILLARY: 159 mg/dL — AB (ref 65–99)
GLUCOSE-CAPILLARY: 173 mg/dL — AB (ref 65–99)
GLUCOSE-CAPILLARY: 189 mg/dL — AB (ref 65–99)
GLUCOSE-CAPILLARY: 211 mg/dL — AB (ref 65–99)
Glucose-Capillary: 105 mg/dL — ABNORMAL HIGH (ref 65–99)
Glucose-Capillary: 107 mg/dL — ABNORMAL HIGH (ref 65–99)
Glucose-Capillary: 118 mg/dL — ABNORMAL HIGH (ref 65–99)
Glucose-Capillary: 126 mg/dL — ABNORMAL HIGH (ref 65–99)
Glucose-Capillary: 140 mg/dL — ABNORMAL HIGH (ref 65–99)
Glucose-Capillary: 141 mg/dL — ABNORMAL HIGH (ref 65–99)
Glucose-Capillary: 145 mg/dL — ABNORMAL HIGH (ref 65–99)
Glucose-Capillary: 150 mg/dL — ABNORMAL HIGH (ref 65–99)
Glucose-Capillary: 154 mg/dL — ABNORMAL HIGH (ref 65–99)
Glucose-Capillary: 155 mg/dL — ABNORMAL HIGH (ref 65–99)
Glucose-Capillary: 157 mg/dL — ABNORMAL HIGH (ref 65–99)
Glucose-Capillary: 159 mg/dL — ABNORMAL HIGH (ref 65–99)
Glucose-Capillary: 161 mg/dL — ABNORMAL HIGH (ref 65–99)
Glucose-Capillary: 168 mg/dL — ABNORMAL HIGH (ref 65–99)
Glucose-Capillary: 175 mg/dL — ABNORMAL HIGH (ref 65–99)
Glucose-Capillary: 197 mg/dL — ABNORMAL HIGH (ref 65–99)
Glucose-Capillary: 198 mg/dL — ABNORMAL HIGH (ref 65–99)
Glucose-Capillary: 201 mg/dL — ABNORMAL HIGH (ref 65–99)
Glucose-Capillary: 205 mg/dL — ABNORMAL HIGH (ref 65–99)

## 2014-10-10 LAB — PHOSPHORUS

## 2014-10-10 LAB — CBC
HCT: 26.5 % — ABNORMAL LOW (ref 35.0–47.0)
Hemoglobin: 8.9 g/dL — ABNORMAL LOW (ref 12.0–16.0)
MCH: 30.5 pg (ref 26.0–34.0)
MCHC: 33.3 g/dL (ref 32.0–36.0)
MCV: 91.4 fL (ref 80.0–100.0)
Platelets: 73 10*3/uL — ABNORMAL LOW (ref 150–440)
RBC: 2.9 MIL/uL — ABNORMAL LOW (ref 3.80–5.20)
RDW: 16.8 % — ABNORMAL HIGH (ref 11.5–14.5)
WBC: 17.6 10*3/uL — ABNORMAL HIGH (ref 3.6–11.0)

## 2014-10-10 LAB — APTT: aPTT: 54 seconds — ABNORMAL HIGH (ref 24–36)

## 2014-10-10 LAB — MAGNESIUM: MAGNESIUM: 1.8 mg/dL (ref 1.7–2.4)

## 2014-10-10 MED ORDER — PUREFLOW DIALYSIS SOLUTION
INTRAVENOUS | Status: DC
  Administered 2014-10-10: 14:00:00 via INTRAVENOUS_CENTRAL

## 2014-10-10 MED ORDER — TRACE MINERALS CR-CU-MN-SE-ZN 10-1000-500-60 MCG/ML IV SOLN
INTRAVENOUS | Status: AC
  Administered 2014-10-10: 18:00:00 via INTRAVENOUS
  Filled 2014-10-10: qty 1680

## 2014-10-10 NOTE — Progress Notes (Signed)
Nutrition Follow-up    INTERVENTION:   (TPN: discussed nutritional poc during CCU rounds, plan to continue current TPN; EN: no plan for initiation of EN at this time)  NUTRITION DIAGNOSIS:  Inadequate oral intake related to inability to eat as evidenced by NPO status. Being addressed via TPN    GOAL:   (Goal would be for pt to tolerate enteral nutrition within 48-72 hr and for TPN to be discontinued)   MONITOR:   (PN, EN, Electrolyte and renal profile, Anthropometric, Digestive system )  REASON FOR ASSESSMENT:  Consult  (RD Follow Up)  ASSESSMENT:  Pt remains on vent, on CRRT. Off sedation this AM  Electrolyte and Renal Profile:  Recent Labs Lab 10/08/14 0437 10/08/14 1409  10/09/14 0428  10/09/14 1546 10/09/14 2345 10/10/14 0533  BUN 39* 41*  41*  < > 47*  < > 50* 50* 53*  53*  CREATININE SEE COMMENTS SEE COMMENT  SEE COMMENT  < > SEE COMMENTS  < > SEE COMMENT SEE COMMENTS UNABLE TO REPORT DUE TO ICTERUS  SEE COMMENTS  NA 136 137  137  < > 136  < > 136 133* 132*  134*  K 3.8 4.1  4.1  < > 4.6  < > 4.3 4.4 4.5  4.5  MG 2.0  --   --  2.1  --   --   --  1.8  PHOS SEE COMMENT SEE COMMENT  --   --   --  SEE COMMENT  --  UNABLE TO REPORT DUE TO ICTERUS  < > = values in this interval not displayed.  Glucose Profile:  Recent Labs  10/10/14 0933 10/10/14 1038 10/10/14 1125  GLUCAP 198* 175* 154*   Protein Profile:  Recent Labs Lab 10/09/14 1546 10/09/14 2345 10/10/14 0533  ALBUMIN 2.9* 2.8* 2.7*  2.7*   Nutritional Anemia Profile:  CBC Latest Ref Rng 10/10/2014 10/09/2014 10/08/2014  WBC 3.6 - 11.0 K/uL 17.6(H) 25.8(H) 26.5(H)  Hemoglobin 12.0 - 16.0 g/dL 8.9(L) 9.1(L) 9.0(L)  Hematocrit 35.0 - 47.0 % 26.5(L) 27.4(L) 26.5(L)  Platelets 150 - 440 K/uL 73(L) 72(L) 75(L)    Meds: levophed, fentanyl, insulin drip  Digestive System: OG with 900 mL coffee ground output    Height:  Ht Readings from Last 1 Encounters:  09/10/2014 5\' 1"  (1.549 m)     Weight:  Wt Readings from Last 1 Encounters:  10/10/14 187 lb 2.7 oz (84.9 kg)   Filed Weights   10/08/14 0500 10/09/14 0500 10/10/14 0405  Weight: 202 lb 6.1 oz (91.8 kg) 204 lb 5.9 oz (92.7 kg) 187 lb 2.7 oz (84.9 kg)     BMI:  Body mass index is 35.38 kg/(m^2).  Estimated Nutritional Needs:   Kcal:  (11-14 kcals/d) Using actual wt of 95kg) 1050-1330 kcals/d  Protein:  (2.0-2.5 g/d) Using IbW of 48kg 96-120 g/d  Fluid:  (25-49ml/kg) Using IBW of 48kg 1200-1454ml/d  Skin:  Reviewed, no issues  Diet Order:  Diet NPO time specified .TPN (CLINIMIX-E) Adult .TPN (CLINIMIX-E) Adult  EDUCATION NEEDS:  No education needs identified at this time   Intake/Output Summary (Last 24 hours) at 10/10/14 1210 Last data filed at 10/10/14 1100  Gross per 24 hour  Intake 2976.97 ml  Output   5945 ml  Net -2968.03 ml   HIGH Care Level  Kerman Passey MS, RD, LDN [phone] Pager

## 2014-10-10 NOTE — Progress Notes (Signed)
Subjective:  Remains critically ill. Still on CRRT. UF 225 cc/hr 4K bath - pharmacy is mixing NG placed to suction: 911mL Remains anuric. TPN 70 cc/hr Vasopressin and norepinephrine On Pressure Support FiO2 35% Off fentanyl this morning, opening eyes  Objective:  Vital signs in last 24 hours:  Temp:  [97.7 F (36.5 C)-97.9 F (36.6 C)] 97.7 F (36.5 C) (06/06 0700) Pulse Rate:  [87-94] 88 (06/06 0700) Resp:  [6-8] 7 (06/06 0700) BP: (83-109)/(53-81) 94/65 mmHg (06/06 0700) SpO2:  [91 %-99 %] 94 % (06/06 0700) FiO2 (%):  [35 %] 35 % (06/06 0700) Weight:  [84.9 kg (187 lb 2.7 oz)] 84.9 kg (187 lb 2.7 oz) (06/06 0405)  Weight change: -7.8 kg (-17 lb 3.1 oz) Filed Weights   10/08/14 0500 10/09/14 0500 10/10/14 0405  Weight: 91.8 kg (202 lb 6.1 oz) 92.7 kg (204 lb 5.9 oz) 84.9 kg (187 lb 2.7 oz)    Intake/Output: I/O last 3 completed shifts: In: 4905.2 [I.V.:2055.2; NG/GT:100; IV Piggyback:300] Out: 7740 [Emesis/NG output:900; Other:6840]     Physical Exam: General: Critically ill appearing  HEENT Scleral icterus noted, ETT in place, NGT to suction  Neck supple  Pulm/lungs Decreased breath sounds at bases, vent assisted: PS FiO2 35%  CVS/Heart S1S2 no rubs  Abdomen:  distended, tense, decreased Bowel sounds  Extremities: 3+ anasarca  Neurologic: On the vent  Skin: Cools extremities  Access: Temp cath- rt femoral 5/25 Dr. Delana Meyer       Basic Metabolic Panel:  Recent Labs Lab 10/08/2014 3710  10/07/2014 2338 10/07/14 6269 10/07/14 0730  10/08/14 4854 10/08/14 1409  10/09/14 6270 10/09/14 0502 10/09/14 1546 10/09/14 2345 10/10/14 0533  NA  --   < > 137  --  137  < > 136 137  137  < > 136 136 136 133* 134*  K  --   < > 3.9  --  3.9  < > 3.8 4.1  4.1  < > 4.6 4.6 4.3 4.4 4.5  CL  --   < > 104  --  104  < > 105 103  104  < > 104 103 105 102 103  CO2  --   < > 28  --  27  < > 26 27  26   < > 26 25 27 25 24   GLUCOSE  --   < > 237*  --  235*  < > 146* 183*   196*  < > 178* 174* 185* 171* 198*  BUN  --   < > 29*  --  33*  < > 39* 41*  41*  < > 47* 45* 50* 50* 53*  CREATININE  --   < > UNABLE TO REPORT DUE TO ICTERUS  --  SEE COMMENTS  < > SEE COMMENTS SEE COMMENT  SEE COMMENT  < > SEE COMMENTS SEE COMMENTS SEE COMMENT SEE COMMENTS SEE COMMENTS  CALCIUM  --   < > 9.1  --  8.8*  < > 9.0 9.4  9.3  < > 9.1 9.0 9.2 9.0 9.0  MG 1.8  --   --  1.9  --   --  2.0  --   --  2.1  --   --   --  1.8  PHOS  --   < > UNABLE TO REPORT DUE TO ICTERUS  --  SEE COMMENTS  --  SEE COMMENT SEE COMMENT  --   --   --  SEE COMMENT  --   --   < > =  values in this interval not displayed.   CBC:  Recent Labs Lab 11/02/2014 0538 10/07/14 0458 10/07/14 1927 10/08/14 0437 10/09/14 0428 10/10/14 0533  WBC 21.2* 23.4*  --  26.5* 25.8* 17.6*  HGB 7.1* 6.8* 8.8* 9.0* 9.1* 8.9*  HCT 20.7* 21.2* 25.9* 26.5* 27.4* 26.5*  MCV 87.2 87.9  --  90.2 91.9 91.4  PLT 55* 63*  --  75* 72* 73*      Microbiology: Results for orders placed or performed during the hospital encounter of 09/27/2014  Blood culture (routine x 2)     Status: None   Collection Time: 09/08/2014 12:45 AM  Result Value Ref Range Status   Specimen Description BLOOD  Final   Special Requests NONE  Final   Culture  Setup Time   Final    GRAM POSITIVE COCCI IN CHAINS IN BOTH AEROBIC AND ANAEROBIC BOTTLES CRITICAL RESULT CALLED TO, READ BACK BY AND VERIFIED WITH: PAM CRAWFORD AT 1406 09/05/2014 DV    Culture STREPTOCOCCUS SPECIES  Final   Report Status 10/01/2014 FINAL  Final   Organism ID, Bacteria STREPTOCOCCUS SPECIES  Final      Susceptibility   Streptococcus species - MIC (ETEST)*    ERYTHROMYCIN Value in next row Resistant      RESISTANT6    PENICILLIN Value in next row Intermediate      INTERMEDIATE0.25    CEFTRIAXONE Value in next row Sensitive      SENSITIVE0.047    VANCOMYCIN Value in next row Sensitive      SENSITIVE1.0    LEVOFLOXACIN Value in next row Sensitive      SENSITIVE1.0    *  STREPTOCOCCUS SPECIES  Blood culture (routine x 2)     Status: None   Collection Time: 09/25/2014 12:45 AM  Result Value Ref Range Status   Specimen Description BLOOD  Final   Special Requests NONE  Final   Culture  Setup Time   Final    GRAM POSITIVE COCCI IN CHAINS IN BOTH AEROBIC AND ANAEROBIC BOTTLES CRITICAL RESULT CALLED TO, READ BACK BY AND VERIFIED WITH: PAM CRAWFORD AT 1406 09/19/2014 DV    Culture   Final    STREPTOCOCCUS SPECIES REFER TO ACCESSION [id number] COLLECTED ON 09/06/2014 AT 0045 FOR SENSITIVITIES    Report Status 09/30/2014 FINAL  Final  Culture, blood (routine x 2)     Status: None   Collection Time: 10/04/14 11:50 AM  Result Value Ref Range Status   Specimen Description BLOOD  Final   Special Requests NONE  Final   Culture NO GROWTH 5 DAYS  Final   Report Status 10/09/2014 FINAL  Final  Culture, blood (routine x 2)     Status: None   Collection Time: 10/04/14 12:00 PM  Result Value Ref Range Status   Specimen Description BLOOD  Final   Special Requests NONE  Final   Culture NO GROWTH 5 DAYS  Final   Report Status 10/09/2014 FINAL  Final  Culture, respiratory (NON-Expectorated)     Status: None   Collection Time: 10/04/14  2:00 PM  Result Value Ref Range Status   Specimen Description INDUCED SPUTUM  Final   Special Requests Normal  Final   Gram Stain   Final    EXCELLENT SPECIMEN - 90-100% WBCS MANY WBC SEEN MANY YEAST    Culture MODERATE GROWTH CANDIDA ALBICANS  Final   Report Status 10/07/2014 FINAL  Final    Coagulation Studies:  Recent Labs  10/07/14 1120  LABPROT 23.1*  INR 2.03    Urinalysis: No results for input(s): COLORURINE, LABSPEC, PHURINE, GLUCOSEU, HGBUR, BILIRUBINUR, KETONESUR, PROTEINUR, UROBILINOGEN, NITRITE, LEUKOCYTESUR in the last 72 hours.  Invalid input(s): APPERANCEUR    Imaging: No results found.   Medications:   . Marland KitchenTPN (CLINIMIX-E) Adult 70 mL/hr at 10/10/14 0700  . fentaNYL infusion INTRAVENOUS 400 mcg/hr (10/10/14  0700)  . insulin (NOVOLIN-R) infusion 2.9 mL/hr at 10/10/14 0700  . norepinephrine (LEVOPHED) Adult infusion 10.027 mcg/min (10/10/14 0700)  . pureflow 1,000 mL/hr at 10/10/14 0558  . vasopressin (PITRESSIN) infusion - *FOR SHOCK* 0.03 Units/min (10/10/14 0700)   . antiseptic oral rinse  7 mL Mouth Rinse QID  . budesonide (PULMICORT) nebulizer solution  0.5 mg Nebulization BID  . chlorhexidine  15 mL Mouth Rinse BID  . clindamycin (CLEOCIN) IV  600 mg Intravenous 3 times per day  . ipratropium-albuterol  3 mL Nebulization Q4H  . lactulose  20 g Oral BID  . levofloxacin (LEVAQUIN) IV  500 mg Intravenous Q24H  . midodrine  10 mg Oral TID  . pantoprazole (PROTONIX) IV  40 mg Intravenous Q12H  . sodium chloride  10-40 mL Intracatheter Q12H  . sucralfate  1 g Oral 4 times per day  . vecuronium  10 mg Intravenous Once   acetaminophen **OR** acetaminophen, albuterol, heparin, polyethylene glycol powder, sodium chloride  Assessment/ Plan:   64 y.o. African [place] female with diabetes mellitus type 2, hypertension, morbid obesity, osteoarthritis, status post right knee surgery, Amyloidosis with nephrotic syndrome with edema, hypoalbuminemia, renal insufficiency and proteinuria.  1. ARF with CKD st 3 . Baseline cr 1.3. (08/2014)  2. Nephrotic Syndrome with proteinuria: Renal Amyloidosis.  3. Generalized Edema 4. Hypoalbuminemia 5. Right groin cellulitis /sepsis- strep species  6. Diabetes Mellitus type II 7. Hypotension 8. Acute Respiratory Failure with mechanical ventilation  Plan: Patient remains essentially anuric at this point in time. She has tolerated CRRT well. Continue vasopressors. Hemodynamically unstable.  Overall prognosis quite poor. Change to 2 K bath, Continue 259mL UF  LOS: 12 [name] 6/6/20168:10 AM

## 2014-10-10 NOTE — Consult Note (Signed)
PULMONARY / CRITICAL CARE MEDICINE   Name: [name] MRN: [id number] DOB: [birth year]    ADMISSION DATE:  10/03/2014    CHIEF COMPLAINT:   resp failure   SIGNIFICANT EVENTS    Patient remains intubated,sedated. On full vent support, on multiple vasopressors,  on CRRT, patient with liver,renal and resp failure, prognosis is very poor  Plan for sedation vacation, kidney function very poor. Patient delirious, not tolerating TF's  S/p EGD shows erosive and severe esophagitis  Patient has failed SAT/SBt last several days due to resp muscle fatigue and encephlopathy    PAST MEDICAL HISTORY    :  Past Medical History  Diagnosis Date  . HTN (hypertension)   . Diabetes mellitus type II   . Obesity   . Arthritis   . Chronic kidney disease   . Amyloidosis    Past Surgical History  Procedure Laterality Date  . Total abdominal hysterectomy  10/1999    fibroids  . Combined abdominoplasty and liposuction  07/2000  . Total knee arthroplasty Right 09/28/2013    Procedure: RIGHT TOTAL KNEE ARTHROPLASTY;  Surgeon: Mauri Pole, MD;  Location: [facility];  Service: Orthopedics;  Laterality: Right;   Prior to Admission medications   Medication Sig Start Date End Date Taking? Authorizing Provider  metolazone (ZAROXOLYN) 5 MG tablet Take 5 mg by mouth daily.   Yes Historical Provider, MD  midodrine (PROAMATINE) 10 MG tablet Take 5 mg by mouth 3 (three) times daily.    Yes Historical Provider, MD  polyethylene glycol powder (GLYCOLAX/MIRALAX) powder Take 17 g by mouth daily as needed for moderate constipation. 09/20/14  Yes Abner Greenspan, MD  potassium chloride SA (K-DUR,KLOR-CON) 20 MEQ tablet Take 1 tablet (20 mEq total) by mouth daily. 07/25/14  Yes Abner Greenspan, MD  torsemide (DEMADEX) 20 MG tablet Take 20 mg by mouth 2 (two) times daily as needed (swelling).    Yes Historical Provider, MD   No Known Allergies   FAMILY HISTORY   Family History  Problem Relation Age of  Onset  . Arthritis Mother   . Hypertension Mother   . Cancer Mother     breast, lung  . Diabetes Mother   . Hypertension Father       SOCIAL HISTORY    reports that [name] has never smoked. [name] has never used smokeless tobacco. [name] reports that [name] does not drink alcohol or use illicit drugs.  Review of Systems  Unable to perform ROS: critical illness      VITAL SIGNS    Temp:  [97.7 F (36.5 C)-97.9 F (36.6 C)] 97.7 F (36.5 C) (06/06 0700) Pulse Rate:  [87-94] 88 (06/06 0700) Resp:  [6-8] 7 (06/06 0800) BP: (83-109)/(53-81) 101/71 mmHg (06/06 0800) SpO2:  [91 %-99 %] 95 % (06/06 0800) FiO2 (%):  [35 %] 35 % (06/06 0833) Weight:  [187 lb 2.7 oz (84.9 kg)] 187 lb 2.7 oz (84.9 kg) (06/06 0405) HEMODYNAMICS:   VENTILATOR SETTINGS: Vent Mode:  [-] CPAP FiO2 (%):  [35 %] 35 % PEEP:  [5 cmH20] 5 cmH20 Pressure Support:  [15 cmH20] 15 cmH20 INTAKE / OUTPUT:  Intake/Output Summary (Last 24 hours) at 10/10/14 0907 Last data filed at 10/10/14 0800  Gross per 24 hour  Intake 3066.4 ml  Output   5897 ml  Net -2830.6 ml       PHYSICAL EXAM   Physical Exam  Constitutional: [name] appears distressed.  HENT:  Head: Normocephalic and atraumatic.  Eyes:  Pupils are equal, round, and reactive to light. No scleral icterus.  Neck: Normal range of motion. Neck supple.  Cardiovascular: Normal rate and regular rhythm.   No murmur heard. Pulmonary/Chest: No respiratory distress. [name] has no wheezes. [name] has rales.  resp distress  Abdominal: Soft. [name] exhibits no distension. There is no tenderness.  Musculoskeletal: [name] exhibits no edema.  Neurological: [name] displays normal reflexes. Coordination normal.  gcs<8T  Skin: Skin is warm. No rash noted. [name] is diaphoretic.       LABS   LABS:  CBC  Recent Labs Lab 10/08/14 0437 10/09/14 0428 10/10/14 0533  WBC 26.5* 25.8* 17.6*  HGB 9.0* 9.1* 8.9*  HCT 26.5* 27.4* 26.5*  PLT 75* 72* 73*   Coag's  Recent Labs Lab  10/05/14 0614 10/13/2014 0538  10/07/14 1120 10/08/14 0437 10/09/14 0428 10/10/14 0533  APTT 50* 42*  < > 41* 43* 46* 54*  INR 3.00 2.25  --  2.03  --   --   --   < > = values in this interval not displayed. BMET  Recent Labs Lab 10/09/14 1546 10/09/14 2345 10/10/14 0533  NA 136 133* 132*  134*  K 4.3 4.4 4.5  4.5  CL 105 102 102  103  CO2 27 25 24  24   BUN 50* 50* 53*  53*  CREATININE SEE COMMENT SEE COMMENTS UNABLE TO REPORT DUE TO ICTERUS  SEE COMMENTS  GLUCOSE 185* 171* 224*  198*   Electrolytes  Recent Labs Lab 10/08/14 0437 10/08/14 1409  10/09/14 0428  10/09/14 1546 10/09/14 2345 10/10/14 0533  CALCIUM 9.0 9.4  9.3  < > 9.1  < > 9.2 9.0 8.7*  9.0  MG 2.0  --   --  2.1  --   --   --  1.8  PHOS SEE COMMENT SEE COMMENT  --   --   --  SEE COMMENT  --  UNABLE TO REPORT DUE TO ICTERUS  < > = values in this interval not displayed. Sepsis Markers No results for input(s): LATICACIDVEN, PROCALCITON, O2SATVEN in the last 168 hours. ABG  Recent Labs Lab 10/04/14 0510 10/30/2014 1045 10/07/14 1125  PHART 7.41 7.44 7.34*  PCO2ART 44 44 51*  PO2ART 59* 66* 68*   Liver Enzymes  Recent Labs Lab 10/08/14 0658 10/08/14 1409  10/09/14 1546 10/09/14 2345 10/10/14 0533  AST 81* 85*  --   --   --  117*  ALT 30 33  --   --   --  40  ALKPHOS 594* 702*  --   --   --  874*  BILITOT 31.4* 31.4*  --   --   --  37.4*  ALBUMIN 3.1* 3.0*  2.9*  < > 2.9* 2.8* 2.7*  2.7*  < > = values in this interval not displayed. Cardiac Enzymes No results for input(s): TROPONINI, PROBNP in the last 168 hours. Glucose  Recent Labs Lab 10/10/14 0326 10/10/14 0429 10/10/14 0526 10/10/14 0634 10/10/14 0730 10/10/14 0821  GLUCAP 145* 159* 173* 197* 205* 211*     Recent Results (from the past 240 hour(s))  Culture, blood (routine x 2)     Status: None   Collection Time: 10/04/14 11:50 AM  Result Value Ref Range Status   Specimen Description BLOOD  Final   Special  Requests NONE  Final   Culture NO GROWTH 5 DAYS  Final   Report Status 10/09/2014 FINAL  Final  Culture, blood (routine x 2)  Status: None   Collection Time: 10/04/14 12:00 PM  Result Value Ref Range Status   Specimen Description BLOOD  Final   Special Requests NONE  Final   Culture NO GROWTH 5 DAYS  Final   Report Status 10/09/2014 FINAL  Final  Culture, respiratory (NON-Expectorated)     Status: None   Collection Time: 10/04/14  2:00 PM  Result Value Ref Range Status   Specimen Description INDUCED SPUTUM  Final   Special Requests Normal  Final   Gram Stain   Final    EXCELLENT SPECIMEN - 90-100% WBCS MANY WBC SEEN MANY YEAST    Culture MODERATE GROWTH CANDIDA ALBICANS  Final   Report Status 10/07/2014 FINAL  Final     Current facility-administered medications:  .  Marland KitchenTPN (CLINIMIX-E) Adult, , Intravenous, Continuous TPN, Flora Lipps, MD, Last Rate: 70 mL/hr at 10/10/14 0800 .  acetaminophen (TYLENOL) tablet 650 mg, 650 mg, Oral, Q6H PRN **OR** acetaminophen (TYLENOL) suppository 650 mg, 650 mg, Rectal, Q6H PRN, Lytle Butte, MD, 650 mg at 10/03/14 1030 .  albuterol (PROVENTIL) (2.5 MG/3ML) 0.083% nebulizer solution 2.5 mg, 2.5 mg, Nebulization, Q6H PRN, Juluis Mire, MD, 2.5 mg at 10/03/14 0816 .  antiseptic oral rinse (CPC / CETYLPYRIDINIUM CHLORIDE 0.05%) solution 7 mL, 7 mL, Mouth Rinse, QID, Herbon E Raul Del, MD, 7 mL at 10/10/14 0400 .  budesonide (PULMICORT) nebulizer solution 0.5 mg, 0.5 mg, Nebulization, BID, Flora Lipps, MD, 0.5 mg at 10/10/14 8841 .  chlorhexidine (PERIDEX) 0.12 % solution 15 mL, 15 mL, Mouth Rinse, BID, Herbon London Pepper, MD, 15 mL at 10/10/14 0800 .  clindamycin (CLEOCIN) IVPB 600 mg, 600 mg, Intravenous, 3 times per day, Adrian Prows, MD, 600 mg at 10/10/14 0601 .  fentaNYL 2535mcg in NS 258mL (52mcg/ml) infusion-PREMIX, 10 mcg/hr, Intravenous, Continuous, Srikar Sudini, MD, Last Rate: 40 mL/hr at 10/10/14 0800, 400 mcg/hr at 10/10/14 0800 .   heparin injection 1,000-6,000 Units, 1,000-6,000 Units, CRRT, PRN, Murlean Iba, MD, 1,000 Units at 10/01/14 1435 .  insulin regular (NOVOLIN R,HUMULIN R) 250 Units in sodium chloride 0.9 % 250 mL (1 Units/mL) infusion, , Intravenous, Continuous, Flora Lipps, MD, Last Rate: 4.5 mL/hr at 10/10/14 0800 .  ipratropium-albuterol (DUONEB) 0.5-2.5 (3) MG/3ML nebulizer solution 3 mL, 3 mL, Nebulization, Q4H, Flora Lipps, MD, 3 mL at 10/10/14 0833 .  lactulose (CHRONULAC) 10 GM/15ML solution 20 g, 20 g, Oral, BID, Josefine Class, MD, 20 g at 10/09/14 2139 .  levofloxacin (LEVAQUIN) IVPB 500 mg, 500 mg, Intravenous, Q24H, Adrian Prows, MD, 500 mg at 10/09/14 1709 .  midodrine (PROAMATINE) tablet 10 mg, 10 mg, Oral, TID, Lytle Butte, MD, 10 mg at 10/09/14 1845 .  norepinephrine (LEVOPHED) 16 mg in dextrose 5 % 250 mL (0.064 mg/mL) infusion, 0-40 mcg/min, Intravenous, Titrated, Srikar Sudini, MD, Last Rate: 9.4 mL/hr at 10/10/14 0800, 10.027 mcg/min at 10/10/14 0800 .  pantoprazole (PROTONIX) injection 40 mg, 40 mg, Intravenous, Q12H, Flora Lipps, MD, 40 mg at 10/09/14 2145 .  polyethylene glycol powder (GLYCOLAX/MIRALAX) container 17 g, 17 g, Oral, Daily PRN, Lytle Butte, MD .  pureflow IV solution for Dialysis, , CRRT, Continuous, Munsoor Lateef, MD, Last Rate: 1,000 mL/hr at 10/10/14 0558 .  sodium chloride 0.9 % injection 10-40 mL, 10-40 mL, Intracatheter, Q12H, Srikar Sudini, MD, 20 mL at 10/09/14 2146 .  sodium chloride 0.9 % injection 10-40 mL, 10-40 mL, Intracatheter, PRN, Srikar Sudini, MD .  sucralfate (CARAFATE) 1 GM/10ML suspension 1 g, 1  g, Oral, 4 times per day, Josefine Class, MD, 1 g at 10/10/14 0601 .  vasopressin (PITRESSIN) 40 Units in sodium chloride 0.9 % 250 mL (0.16 Units/mL) infusion, 0.03 Units/min, Intravenous, Continuous, Lytle Butte, MD, Last Rate: 11.3 mL/hr at 10/10/14 0800, 0.03 Units/min at 10/10/14 0800 .  vecuronium (NORCURON) injection 10 mg, 10 mg,  Intravenous, Once, Flora Lipps, MD, 10 mg at 10/17/2014 1243  IMAGING    No results found.    Indwelling Urinary Catheter continued, requirement due to   Reason to continue Indwelling Urinary Catheter for strict Intake/Output monitoring for hemodynamic instability   Central Line continued, requirement due to   Reason to continue [employer] Monitoring of central venous pressure or other hemodynamic parameters   Ventilator continued, requirement due to, resp failure    Ventilator Sedation RASS 0 to -2      ASSESSMENT/PLAN   64 yo AAF admitted to ICU for acute septic shock and progressive resp failure with renal failure Patient with underlying Amyloidosis with progressive multiorgan failure   PULMONARY-Respiratory Failure-failed SAT/SBT due to resp muscle fatigue and encephlopathy -continue Full MV support -continue Bronchodilator Therapy -Wean Fio2 and PEEP as tolerated  CARDIOVASCULAR -septic shock-use vasopressors to keep MAP>65 -ABX as prescribed -stress dose steroids completed  RENAL-no urine output-at this time, patient not likely to recover kidney function -progressive renal failure from ATN -on CRRT -follow up Nephrology recs  GASTROINTESTINAL-GIB-severe erosive esophagitis, showing signs of intolerance to Tf's -bowel rest for now, continue TPN -liver failure-worsening -follow up Gi recs   HEMATOLOGIC -follow H/h s/p transfusion -follow INR s/p FFP  INFECTIOUS- -streptococcus bacteremia from cellulitis -on abx -repeat blood cultures no growth -follow up ID recs   ENDOCRINE -follow FSBS -on insulin drip  NEUROLOGIC -wean sedation and assess neuro status  NUTRITION-on TPN  I have personally obtained a history, examined the patient, evaluated laboratory and imaging results, formulated the assessment and plan and placed orders.  The Patient requires high complexity decision making for assessment and support, frequent evaluation and titration  of therapies, application of advanced monitoring technologies and extensive interpretation of multiple databases. Critical Care Time devoted to patient care services described in this note is 45 minutes.   Overall, patient is critically ill, prognosis is very poor/guarded. Patient at high risk for cardiac arrest and death.   Palliative care team consulted-recommend DNR status, recommend comfort care measures  Corrin Parker, M.D. [facility] Director Intensive Care Unit   10/10/2014, 9:07 AM

## 2014-10-10 NOTE — Progress Notes (Signed)
Date of Admission:  09/25/2014     ID: Cortana Vanderford is a 64 y.o. female with  [name] and anasarca admitted with sepsis from cellulitis on thigh.  [place] with strep species  Active Problems:   Acute renal failure   Amyloidosis   Septic shock   Leaking central line catheter   Acute respiratory failure with hypoxia   Acute liver failure  Subjective:  Remains on CRRT, intubated, on one pressors.  Daughter in law at bedside.  Medications:  . antiseptic oral rinse  7 mL Mouth Rinse QID  . budesonide (PULMICORT) nebulizer solution  0.5 mg Nebulization BID  . chlorhexidine  15 mL Mouth Rinse BID  . clindamycin (CLEOCIN) IV  600 mg Intravenous 3 times per day  . ipratropium-albuterol  3 mL Nebulization Q4H  . lactulose  20 g Oral BID  . levofloxacin (LEVAQUIN) IV  500 mg Intravenous Q24H  . midodrine  10 mg Oral TID  . pantoprazole (PROTONIX) IV  40 mg Intravenous Q12H  . sodium chloride  10-40 mL Intracatheter Q12H  . sucralfate  1 g Oral 4 times per day    Objective: Vital signs in last 24 hours: Temp:  [97.6 F (36.4 C)-97.9 F (36.6 C)] 97.6 F (36.4 C) (06/06 1035) Pulse Rate:  [87-94] 88 (06/06 1400) Resp:  [6-12] 9 (06/06 1400) BP: (83-109)/(51-81) 93/61 mmHg (06/06 1400) SpO2:  [91 %-100 %] 99 % (06/06 1400) FiO2 (%):  [35 %-38 %] 35 % (06/06 1400) Weight:  [84.9 kg (187 lb 2.7 oz)] 84.9 kg (187 lb 2.7 oz) (06/06 0405)  GENERAL: intubated, critically ill appearing EYES: Pupils equal, round, reactive to light and accommodation. No scleral icterus. Extraocular muscles intact. HEENT: Head atraumatic, normocephalic. Oropharynx and nasopharynx clear. NECK: Supple, no jugular venous distention. No thyroid enlargement, no tenderness. Right IJ TL. LUNGS: Normal breath sounds bilaterally, no wheezing, rales, rhonchi. No use of accessory muscles of respiration. CARDIOVASCULAR: tachy  S1, S2 normal. 2/6 sm ABDOMEN: Soft, nontender, nondistended. Bowel sounds present. No  organomegaly or mass. EXTREMITIES: No cyanosis, clubbing. Anasarca. Superficial ulcer right lateral thigh.  NEUROLOGIC: sedated PSYCHIATRIC: The patient is sedated SKIN: decreased erythema over thigh , denuded bullae covered, smaller bullae with clear fluid  Lab Results  Recent Labs  10/09/14 0428  10/09/14 2345 10/10/14 0533  WBC 25.8*  --   --  17.6*  HGB 9.1*  --   --  8.9*  HCT 27.4*  --   --  26.5*  NA 136  < > 133* 132*  134*  K 4.6  < > 4.4 4.5  4.5  CL 104  < > 102 102  103  CO2 26  < > 25 24  24   BUN 47*  < > 50* 53*  53*  CREATININE SEE COMMENTS  < > SEE COMMENTS UNABLE TO REPORT DUE TO ICTERUS  SEE COMMENTS  < > = values in this interval not displayed. Liver Panel  Recent Labs  10/08/14 0658 10/08/14 1409  10/09/14 2345 10/10/14 0533  PROT 6.7 6.9  --   --  6.7  ALBUMIN 3.1* 3.0*  2.9*  < > 2.8* 2.7*  2.7*  AST 81* 85*  --   --  117*  ALT 30 33  --   --  40  ALKPHOS 594* 702*  --   --  874*  BILITOT 31.4* 31.4*  --   --  37.4*  BILIDIR 22.6*  --   --   --   --  IBILI 8.8*  --   --   --   --   < > = values in this interval not displayed.  Microbiology: Results for orders placed or performed during the hospital encounter of 09/20/2014  Blood culture (routine x 2)     Status: None   Collection Time: 09/17/2014 12:45 AM  Result Value Ref Range Status   Specimen Description BLOOD  Final   Special Requests NONE  Final   Culture  Setup Time   Final    GRAM POSITIVE COCCI IN CHAINS IN BOTH AEROBIC AND ANAEROBIC BOTTLES CRITICAL RESULT CALLED TO, READ BACK BY AND VERIFIED WITH: PAM CRAWFORD AT 1406 09/21/2014 DV    Culture STREPTOCOCCUS SPECIES  Final   Report Status 10/01/2014 FINAL  Final   Organism ID, Bacteria STREPTOCOCCUS SPECIES  Final      Susceptibility   Streptococcus species - MIC (ETEST)*    ERYTHROMYCIN Value in next row Resistant      RESISTANT6    PENICILLIN Value in next row Intermediate      INTERMEDIATE0.25    CEFTRIAXONE Value in  next row Sensitive      SENSITIVE0.047    VANCOMYCIN Value in next row Sensitive      SENSITIVE1.0    LEVOFLOXACIN Value in next row Sensitive      SENSITIVE1.0    * STREPTOCOCCUS SPECIES  Blood culture (routine x 2)     Status: None   Collection Time: 09/22/2014 12:45 AM  Result Value Ref Range Status   Specimen Description BLOOD  Final   Special Requests NONE  Final   Culture  Setup Time   Final    GRAM POSITIVE COCCI IN CHAINS IN BOTH AEROBIC AND ANAEROBIC BOTTLES CRITICAL RESULT CALLED TO, READ BACK BY AND VERIFIED WITH: PAM CRAWFORD AT 1406 09/12/2014 DV    Culture   Final    STREPTOCOCCUS SPECIES REFER TO ACCESSION [id number] COLLECTED ON 09/17/2014 AT 0045 FOR SENSITIVITIES    Report Status 09/30/2014 FINAL  Final  Culture, blood (routine x 2)     Status: None   Collection Time: 10/04/14 11:50 AM  Result Value Ref Range Status   Specimen Description BLOOD  Final   Special Requests NONE  Final   Culture NO GROWTH 5 DAYS  Final   Report Status 10/09/2014 FINAL  Final  Culture, blood (routine x 2)     Status: None   Collection Time: 10/04/14 12:00 PM  Result Value Ref Range Status   Specimen Description BLOOD  Final   Special Requests NONE  Final   Culture NO GROWTH 5 DAYS  Final   Report Status 10/09/2014 FINAL  Final  Culture, respiratory (NON-Expectorated)     Status: None   Collection Time: 10/04/14  2:00 PM  Result Value Ref Range Status   Specimen Description INDUCED SPUTUM  Final   Special Requests Normal  Final   Gram Stain   Final    EXCELLENT SPECIMEN - 90-100% WBCS MANY WBC SEEN MANY YEAST    Culture MODERATE GROWTH CANDIDA ALBICANS  Final   Report Status 10/07/2014 FINAL  Final    Studies/Results: No results found. Echo 09/29/14 - Procedure narrative: Transthoracic echocardiography. Image quality was poor. No apical views. - Left ventricle: The cavity size was normal. There was moderate concentric hypertrophy. Systolic function was normal.  The estimated ejection fraction was in the range of 60% to 65%. Wall motion was normal; there were no regional wall motion abnormalities. The study is  not technically sufficient to allow evaluation of LV diastolic function. Impressions: - There was no evidence of a vegetation.  CT abd 5/28 IMPRESSION: 1. Bilateral pleural effusions, ascites and anasarca is identified consistent with fluid overload state. 2. Subcutaneous fat stranding and skin thickening may be related to anasarca or cellulitis. 3. No fluid collections identified. 4. Left lung opacities may reflect pneumonia and/r or areas of asymmetric edema.  Assessment/Plan: Mikaela Hilgeman is a 65 y.o. 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  Now also with multiorgan failure, elevated LFTS, possible aspiration pna.  Decreasing hemoglobin TTE negative.  CT abd pelvis did not show any significant biliary pathology but possible sludge. Fevers resolved and wbc down    Recommendations Changed ceftriaxone to levofloxacin (6/1) as ceftriaxone may be contributing to the elevated LFTs (doubt the main cause though)  - this will  cover the streptococcal bacteremia I have discontinued clindamycin at this point.  FU BCX to document clearance has been neg from 5/31 - would rec a 14 day total course of abx from first negative culture - stop date 6/14 Sputum cx with yeast but unlikely to need treatment Consider TEE to evaluate for endocarditis given that had likely  viridans strep bacteremia- if 5/31 cx turns positive will definitely need TEE but given elevated coags, low plts, severe esophagitis hesitant to do at this point  [facility], [place]   10/10/2014, 2:38 PM

## 2014-10-10 NOTE — Progress Notes (Signed)
[facility] NAME: [name]    MR#:  [id number]  DATE OF BIRTH:  [birth year]  SUBJECTIVE:  CHIEF COMPLAINT:   Chief Complaint  Patient presents with  . Altered Mental Status    Critically ill. On 2 pressors. CRRT. Awake but not following commands.  REVIEW OF SYSTEMS:    ROS  Unable to give history due to encephalopathy patient intubated   DRUG ALLERGIES:  No Known Allergies  VITALS:  Blood pressure 99/72, pulse 47, temperature 97.7 F (36.5 C), temperature source Oral, resp. rate 11, height 5\' 1"  (1.549 m), weight 84.9 kg (187 lb 2.7 oz), SpO2 77 %.  PHYSICAL EXAMINATION:   Physical Exam  GENERAL:  64 y.o.-year-old patient lying in the bed critically ill EYES: Pupils equal, round, reactive to light and accommodation. + scleral icterus.  HEENT: Head atraumatic, normocephalic.Intubated NECK:  Supple, no jugular venous distention. No thyroid enlargement, no tenderness. Right IJ . Right femoral Dialysis catheter. LUNGS: coarse breath sounds, poor air movement, vent CARDIOVASCULAR: S1, S2 normal. No murmurs,  + rub, - gallops. ABDOMEN: Soft, nontender, nondistended. Bowel sounds decreased. No organomegaly or mass. EXTREMITIES: No cyanosis, clubbing. Anasarca.  NEUROLOGIC:  Opens eyes,  Does not follow commands PSYCHIATRIC: The patient is drowzy. SKIN:  Superficial ulcer right thigh.  LABORATORY PANEL:   CBC  Recent Labs Lab 10/10/14 0533  WBC 17.6*  HGB 8.9*  HCT 26.5*  PLT 73*   ------------------------------------------------------------------------------------------------------------------  Chemistries   Recent Labs Lab 10/10/14 0533  NA 132*  134*  K 4.5  4.5  CL 102  103  CO2 24  24  GLUCOSE 224*  198*  BUN 53*  53*  CREATININE UNABLE TO REPORT DUE TO ICTERUS  SEE COMMENTS  CALCIUM 8.7*  9.0  MG 1.8  AST 117*  ALT 40  ALKPHOS 874*  BILITOT 37.4*    ------------------------------------------------------------------------------------------------------------------  Cardiac Enzymes No results for input(s): TROPONINI in the last 168 hours. ------------------------------------------------------------------------------------------------------------------  RADIOLOGY:  Dg Chest Port 1 View  10/07/2014   IMPRESSION: 1. Worsened bilateral airspace opacities. In the context of the patient's moderate enlargement of the cardiopericardial silhouette, the appearance is suspicious for acute pulmonary edema. 2. The endotracheal tube has been successfully retracted, and the tubes and lines appear satisfactorily positioned.   Electronically Signed   By: Van Clines M.D.   On: 10/07/2014 08:42     ASSESSMENT AND PLAN:   64 year old African American female history of nephrotic syndrome, amyloidosis, essential hypertension presenting on 5/25 with altered mental status/confusion after apparently falling out of bed. Admitted for septic shock  * Septic shock with right flank/thigh cellulitis with streptococcus bacteremia - ID following - Changed to Levaquin and clindamycin . Continue pressors. Off steroids. - Echo- No vegetations. - Repeat blood cultures negative to date.  * Acute renal failure over CKD3 due to ATN. From septic shock in the setting of amyloidosis On CRRT. Appreciate nephrology help. Severe hypoalbuminemia. Getting IV albumin. Will likely progress to ESRD.  * Acute liver failure- Due to sepsis - worsening CT abd/pelvis showed no biliary dilation or obstruction Severe hyperbilirubinemia > 30. Gastroenterology following  lactulose  * Elevated INR Due to liver failure. DIC unlikely per oncology, recheck smear, fibrinogen today 4 units FFP given 6/1 minimal improvement in INR  * Acute encephalopathy - Due to acute illness. CT head nothing acute.  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 Plan was for chemo as OP with  Dr. Grayland Ormond but now on hold due to acute illness.. Oncology following  * Upper GI bleed with Acute blood loss  anemia over AOCD EGD showed severe esophagitis. On protonix 80 mg IV every 12. Transfused 4 units this admission.  * Type 2 diabetes, uncomplicated: Hold oral agents, at insulin slide scale every 6 hours Accu-Chek  * Essential hypertension: Held all meds  * Venous thromboembolism prophylactic: Heparin subcutaneous stopped due to high INR and low platelets and GIB  * Nutrition TFs  * Acute respiratory failure -Full vent support - due to suspected aspiration of gastrointestinal content - Pulmonology following - Full ventilator support  CODE STATUS: FULL CODE  Multi organ failure. High risk for cardiac arrest and death   TOTAL CRITICAL CARE TIME TAKING CARE OF THIS PATIENT: 35 minutes.    [name] R M.D on 10/10/2014 at 10:11 AM  Between 7am to 6pm - Pager - [phone]  After 6pm go to [email or web address] - password [email or web address] [facility]  [facility]  Office  [phone]  CC: Primary care physician; [name], MD

## 2014-10-10 NOTE — Progress Notes (Signed)
Inpatient Diabetes Program Recommendations  AACE/ADA: New Consensus Statement on Inpatient Glycemic Control (2013)  Target Ranges:  Prepandial:   less than 140 mg/dL      Peak postprandial:   less than 180 mg/dL (1-2 hours)      Critically ill patients:  140 - 180 mg/dL     Results for FONDA, ROCHON (MRN [id number]) as of 10/10/2014 12:21  Ref. Range 10/09/2014 23:51 10/10/2014 01:26 10/10/2014 02:30 10/10/2014 03:26 10/10/2014 04:29 10/10/2014 05:26 10/10/2014 06:34 10/10/2014 07:30 10/10/2014 08:21 10/10/2014 09:33 10/10/2014 10:38 10/10/2014 11:25  Glucose-Capillary Latest Ref Range: 65-99 mg/dL 159 (H) 151 (H) 144 (H) 145 (H) 159 (H) 173 (H) 197 (H) 205 (H) 211 (H) 198 (H) 175 (H) 154 (H)     Current DM Orders: IV Insulin Drip per the ICU Glycemic Control Protocol (Phase 2)    **Note patient is critically ill requiring sedation, intubation, vasopressors, CRRT.  **Patient has been receiving an IV insulin drip since 11 am on 06/03.  **Note that the IV Insulin drip is the most effective and the safest insulin we can administer to this critically ill patient at this time.    If decision made to transition patient off the IV insulin drip to SQ insulin, please use the transition parameters as set forth by the ICU Glycemic Control Protocol-  1. IV Insulin drip rate less than 4 units/hour  2. Tube feeds or TPN are stable  3. Patient has 6 consecutive fingerstick glucose readings less than 180 mg/dl   Transition to Phase 3 of the ICU Glycemic Control Protocol     Will follow Wyn Quaker RN, MSN, CDE Diabetes Coordinator Inpatient Glycemic Control Team Team Pager: [phone] (8a-5p)

## 2014-10-10 NOTE — Consult Note (Signed)
Palliative Medicine Inpatient Consult Follow Up Note   Name: [name] Date: 10/10/2014 MRN: [id number]  DOB: 25-Jun-1950  Referring Physician: Hillary Bow, MD  Palliative Care consult requested for this 64 y.o. female for goals of medical therapy in patient with amyloidosis (markers - for multiple myeloma), CKD stage III, T2DM, HTN admitted with sepsis.   Ms Gaber is intubated, sedation off, eyes open. Family not present.     REVIEW OF SYSTEMS:  Patient is not able to provide ROS  CODE STATUS: Full code   PAST MEDICAL HISTORY: Past Medical History  Diagnosis Date  . HTN (hypertension)   . Diabetes mellitus type II   . Obesity   . Arthritis   . Chronic kidney disease   . Amyloidosis     PAST SURGICAL HISTORY:  Past Surgical History  Procedure Laterality Date  . Total abdominal hysterectomy  10/1999    fibroids  . Combined abdominoplasty and liposuction  07/2000  . Total knee arthroplasty Right 09/28/2013    Procedure: RIGHT TOTAL KNEE ARTHROPLASTY;  Surgeon: Mauri Pole, MD;  Location: [facility] ORS;  Service: Orthopedics;  Laterality: Right;    Vital Signs: BP 101/58 mmHg  Pulse 88  Temp(Src) 97.6 F (36.4 C) (Oral)  Resp 11  Ht $R'5\' 1"'PZ$  (1.549 m)  Wt 84.9 kg (187 lb 2.7 oz)  BMI 35.38 kg/m2  SpO2 96% Filed Weights   10/08/14 0500 10/09/14 0500 10/10/14 0405  Weight: 91.8 kg (202 lb 6.1 oz) 92.7 kg (204 lb 5.9 oz) 84.9 kg (187 lb 2.7 oz)    Estimated body mass index is 35.38 kg/(m^2) as calculated from the following:   Height as of this encounter: $RemoveBeforeD'5\' 1"'[id number]$  (1.549 m).   Weight as of this encounter: 84.9 kg (187 lb 2.7 oz).  PHYSICAL EXAM: General: Critically ill appearing HEENT: ETT in place, scleral icterus Neck: Trachea midline  Cardiovascular: regular rate and rhythm Pulmonary/Chest: clear ant fields Abdominal: Soft, nontender, hypoactive bowel sounds GU: Foley present, anuric Extremities: 3 + pitting edema BLE's and BUE's Neurological: awake, ?  Follows simple commands, furrows brow, moves extremities Skin: R flank wound with gauze, CDI Psychiatric: awake, calm  LABS: CBC:    Component Value Date/Time   WBC 17.6* 10/10/2014 0533   WBC 9.2 08/22/2014 1712   HGB 8.9* 10/10/2014 0533   HGB 12.3 08/22/2014 1712   HCT 26.5* 10/10/2014 0533   HCT 38.3 08/22/2014 1712   PLT 73* 10/10/2014 0533   PLT 503* 08/22/2014 1712   MCV 91.4 10/10/2014 0533   MCV 87 08/22/2014 1712   NEUTROABS 21.1* 10/02/2014 1030   NEUTROABS 4.0 08/22/2014 1712   LYMPHSABS 1.5 10/02/2014 1030   LYMPHSABS 4.2* 08/22/2014 1712   MONOABS 2.0* 10/02/2014 1030   MONOABS 0.9 08/22/2014 1712   EOSABS 0.0 10/02/2014 1030   EOSABS 0.0 08/22/2014 1712   BASOSABS 0.0 10/02/2014 1030   BASOSABS 0.1 08/22/2014 1712   Comprehensive Metabolic Panel:    Component Value Date/Time   NA 135 10/10/2014 1420   NA 136 08/22/2014 1712   K 4.2 10/10/2014 1420   K 2.6* 08/22/2014 1712   CL 103 10/10/2014 1420   CL 92* 08/22/2014 1712   CO2 25 10/10/2014 1420   CO2 35* 08/22/2014 1712   BUN 46* 10/10/2014 1420   BUN 29* 08/22/2014 1712   CREATININE SEE COMMENTS 10/10/2014 1420   CREATININE 1.78* 08/22/2014 1712   GLUCOSE 120* 10/10/2014 1420   GLUCOSE 167* 08/22/2014 1712   CALCIUM  9.1 10/10/2014 1420   CALCIUM 8.2* 08/22/2014 1712   AST 117* 10/10/2014 0533   AST 40 08/22/2014 1712   ALT 40 10/10/2014 0533   ALT 22 08/22/2014 1712   ALKPHOS 874* 10/10/2014 0533   ALKPHOS 294* 08/22/2014 1712   BILITOT 37.4* 10/10/2014 0533   PROT 6.7 10/10/2014 0533   PROT 5.0* 08/22/2014 1712   ALBUMIN 2.7* 10/10/2014 1420   ALBUMIN 1.7* 08/22/2014 1712    IMPRESSION:  Ms. Fini is a 64 y.o. female with amyloidosis (markers - for multiple myeloma), CKD stage III, T2DM, HTN, h/o uterine fibroids, and s/p total abdominal hysterectomy. Pt presented to ER from home with AMS.Workup significant for lactic acidosis, severe hypoalbuminemia, and ARF requiring CRRT. Hospital  course complicated by VDRF.   Pt with worsening bilirubin levels today.  Pt with continued and improving leukocytosis.  Pt is on 1 pressor and CRRT.  Pt now on SBT at 35% FiO2, 5PEEP and 5 PS (total of 10).  Will continue to follow pt as she is on SBT and will speak with family when pt is ready to extubated about wishes as they are still unsure.    PLAN: 1. Full code     More than 50% of the visit was spent in counseling/coordination of care: YES  Time Spent: 25 minutes

## 2014-10-10 NOTE — Plan of Care (Signed)
Problem: Phase I Progression Outcomes Goal: Uremic symptoms managed Outcome: Progressing On CRRT . More responsive. Anuric  Goal: OOB as tolerated unless otherwise ordered Outcome: Not Progressing PROM, on bedrest CRRT. Vent Goal: Hemodynamically stable Outcome: Progressing Off vasopressin. On levophed at 14 mcg Goal: Pt./family verbalizes plan of care Outcome: Not Progressing Family remain very optimistic. Palliative care consulted. Remains full code.  Problem: Phase II Progression Outcomes Goal: Urinary output increased Outcome: Not Progressing Anuric Goal: < or Equal 2kg weight gain for 24hrs Outcome: Progressing Less edematous.  Pulling approx 267ml/hr with CRRT Goal: Tolerating diet Outcome: Not Progressing Faint and few bowelsounds. TF held.  NG to LWS with 437ml bile. Abdomen distended/ taut Goal: Assess understanding of kidney failure Outcome: Progressing Family understand she is most likley not to recover kidney function and wl need dialysis

## 2014-10-11 LAB — COMPREHENSIVE METABOLIC PANEL
ALBUMIN: 2.5 g/dL — AB (ref 3.5–5.0)
ALT: 50 U/L (ref 14–54)
AST: 147 U/L — AB (ref 15–41)
Alkaline Phosphatase: 973 U/L — ABNORMAL HIGH (ref 38–126)
Anion gap: 4 — ABNORMAL LOW (ref 5–15)
BILIRUBIN TOTAL: 34.8 mg/dL — AB (ref 0.3–1.2)
BUN: 32 mg/dL — ABNORMAL HIGH (ref 6–20)
CO2: 29 mmol/L (ref 22–32)
Calcium: 9.1 mg/dL (ref 8.9–10.3)
Chloride: 104 mmol/L (ref 101–111)
Glucose, Bld: 147 mg/dL — ABNORMAL HIGH (ref 65–99)
Potassium: 3.5 mmol/L (ref 3.5–5.1)
Sodium: 137 mmol/L (ref 135–145)
Total Protein: 6.7 g/dL (ref 6.5–8.1)

## 2014-10-11 LAB — BLOOD GAS, ARTERIAL
ACID-BASE DEFICIT: 0.8 mmol/L (ref 0.0–2.0)
Acid-Base Excess: NEGATIVE mmol/L (ref 0.0–3.0)
Acid-Base Excess: 2.5 mmol/L (ref 0.0–3.0)
Allens test (pass/fail): POSITIVE — AB
Allens test (pass/fail): POSITIVE — AB
BICARBONATE: 25.4 meq/L (ref 21.0–28.0)
BICARBONATE: 27.9 meq/L (ref 21.0–28.0)
FIO2: 35 %
FIO2: 35 %
Mode: POSITIVE
O2 Saturation: 94.2 %
O2 Saturation: 92.7 %
PCO2 ART: 47 mmHg (ref 32.0–48.0)
PCO2 ART: 45 mmHg (ref 32.0–48.0)
PEEP: 5 cmH2O
PH ART: 7.34 — AB (ref 7.350–7.450)
PO2 ART: 66 mmHg — AB (ref 83.0–108.0)
Patient temperature: 37
Patient temperature: 37
Pressure support: 8 cmH2O
Pressure support: 0 cmH2O
pH, Arterial: 7.4 (ref 7.350–7.450)
pO2, Arterial: 76 mmHg — ABNORMAL LOW (ref 83.0–108.0)

## 2014-10-11 LAB — RENAL FUNCTION PANEL
ALBUMIN: 2.5 g/dL — AB (ref 3.5–5.0)
ALBUMIN: 2.3 g/dL — AB (ref 3.5–5.0)
ALBUMIN: 2.3 g/dL — AB (ref 3.5–5.0)
ANION GAP: 7 (ref 5–15)
Anion gap: 8 (ref 5–15)
Anion gap: 8 (ref 5–15)
BUN: 38 mg/dL — ABNORMAL HIGH (ref 6–20)
BUN: 32 mg/dL — AB (ref 6–20)
BUN: 31 mg/dL — ABNORMAL HIGH (ref 6–20)
CALCIUM: 8.9 mg/dL (ref 8.9–10.3)
CALCIUM: 9.2 mg/dL (ref 8.9–10.3)
CHLORIDE: 102 mmol/L (ref 101–111)
CHLORIDE: 102 mmol/L (ref 101–111)
CO2: 26 mmol/L (ref 22–32)
CO2: 27 mmol/L (ref 22–32)
CO2: 26 mmol/L (ref 22–32)
Calcium: 9.3 mg/dL (ref 8.9–10.3)
Chloride: 103 mmol/L (ref 101–111)
Glucose, Bld: 169 mg/dL — ABNORMAL HIGH (ref 65–99)
Glucose, Bld: 125 mg/dL — ABNORMAL HIGH (ref 65–99)
Glucose, Bld: 216 mg/dL — ABNORMAL HIGH (ref 65–99)
POTASSIUM: 3.7 mmol/L (ref 3.5–5.1)
Potassium: 3.7 mmol/L (ref 3.5–5.1)
Potassium: 3.5 mmol/L (ref 3.5–5.1)
SODIUM: 136 mmol/L (ref 135–145)
SODIUM: 137 mmol/L (ref 135–145)
Sodium: 136 mmol/L (ref 135–145)

## 2014-10-11 LAB — MAGNESIUM: MAGNESIUM: 1.7 mg/dL (ref 1.7–2.4)

## 2014-10-11 LAB — GLUCOSE, CAPILLARY
GLUCOSE-CAPILLARY: 151 mg/dL — AB (ref 65–99)
GLUCOSE-CAPILLARY: 151 mg/dL — AB (ref 65–99)
GLUCOSE-CAPILLARY: 156 mg/dL — AB (ref 65–99)
GLUCOSE-CAPILLARY: 144 mg/dL — AB (ref 65–99)
GLUCOSE-CAPILLARY: 76 mg/dL (ref 65–99)
GLUCOSE-CAPILLARY: 151 mg/dL — AB (ref 65–99)
GLUCOSE-CAPILLARY: 185 mg/dL — AB (ref 65–99)
GLUCOSE-CAPILLARY: 151 mg/dL — AB (ref 65–99)
GLUCOSE-CAPILLARY: 128 mg/dL — AB (ref 65–99)
Glucose-Capillary: 147 mg/dL — ABNORMAL HIGH (ref 65–99)
Glucose-Capillary: 153 mg/dL — ABNORMAL HIGH (ref 65–99)
Glucose-Capillary: 104 mg/dL — ABNORMAL HIGH (ref 65–99)
Glucose-Capillary: 114 mg/dL — ABNORMAL HIGH (ref 65–99)
Glucose-Capillary: 166 mg/dL — ABNORMAL HIGH (ref 65–99)
Glucose-Capillary: 177 mg/dL — ABNORMAL HIGH (ref 65–99)
Glucose-Capillary: 209 mg/dL — ABNORMAL HIGH (ref 65–99)
Glucose-Capillary: 209 mg/dL — ABNORMAL HIGH (ref 65–99)
Glucose-Capillary: 148 mg/dL — ABNORMAL HIGH (ref 65–99)
Glucose-Capillary: 156 mg/dL — ABNORMAL HIGH (ref 65–99)
Glucose-Capillary: 209 mg/dL — ABNORMAL HIGH (ref 65–99)
Glucose-Capillary: 123 mg/dL — ABNORMAL HIGH (ref 65–99)
Glucose-Capillary: 133 mg/dL — ABNORMAL HIGH (ref 65–99)

## 2014-10-11 LAB — CBC
HCT: 27.7 % — ABNORMAL LOW (ref 35.0–47.0)
HEMOGLOBIN: 9.5 g/dL — AB (ref 12.0–16.0)
MCH: 31.3 pg (ref 26.0–34.0)
MCHC: 34.3 g/dL (ref 32.0–36.0)
MCV: 91.3 fL (ref 80.0–100.0)
Platelets: 86 10*3/uL — ABNORMAL LOW (ref 150–440)
RBC: 3.04 MIL/uL — AB (ref 3.80–5.20)
RDW: 16.8 % — ABNORMAL HIGH (ref 11.5–14.5)
WBC: 17.3 10*3/uL — ABNORMAL HIGH (ref 3.6–11.0)

## 2014-10-11 LAB — APTT: aPTT: 52 seconds — ABNORMAL HIGH (ref 24–36)

## 2014-10-11 MED ORDER — METHYLPREDNISOLONE SODIUM SUCC 40 MG IJ SOLR
20.0000 mg | Freq: Four times a day (QID) | INTRAMUSCULAR | Status: AC
  Administered 2014-10-11 – 2014-10-12 (×5): 20 mg via INTRAVENOUS
  Filled 2014-10-11 (×5): qty 1

## 2014-10-11 MED ORDER — TRACE MINERALS CR-CU-MN-SE-ZN 10-1000-500-60 MCG/ML IV SOLN
INTRAVENOUS | Status: AC
  Administered 2014-10-11: 20:00:00 via INTRAVENOUS
  Filled 2014-10-11: qty 1680

## 2014-10-11 MED ORDER — INSULIN GLARGINE 100 UNIT/ML ~~LOC~~ SOLN: 20.0000 [IU] | SUBCUTANEOUS | Status: DC

## 2014-10-11 MED ORDER — PUREFLOW DIALYSIS SOLUTION
INTRAVENOUS | Status: DC
  Administered 2014-10-11 – 2014-10-14 (×6): via INTRAVENOUS_CENTRAL

## 2014-10-11 MED ORDER — INSULIN ASPART 100 UNIT/ML ~~LOC~~ SOLN: 2.0000 [IU] | SUBCUTANEOUS | Status: DC

## 2014-10-11 NOTE — Progress Notes (Signed)
Inpatient Diabetes Program Recommendations  AACE/ADA: New Consensus Statement on Inpatient Glycemic Control (2013)  Target Ranges:  Prepandial:   less than 140 mg/dL      Peak postprandial:   less than 180 mg/dL (1-2 hours)      Critically ill patients:  140 - 180 mg/dL    Results for [name], [name] (MRN [id number]) as of 10/11/2014 09:29  Ref. Range 10/11/2014 00:38 10/11/2014 01:41 10/11/2014 02:36 10/11/2014 03:38 10/11/2014 04:38 10/11/2014 05:41 10/11/2014 06:36 10/11/2014 08:44  Glucose-Capillary Latest Ref Range: 65-99 mg/dL 147 (H) 151 (H) 153 (H) 151 (H) 156 (H) 144 (H) 104 (H) 114 (H)     **Note patient is critically ill requiring sedation, intubation, vasopressors, CRRT.  **Patient has been receiving an IV insulin drip since 11 am on 06/03.  **Note that the IV Insulin drip is the most effective and the safest insulin we can administer to this critically ill patient at this time, however, patient has met the criteria to transition off the IV insulin drip per the ICU Glycemic Control protocol guidelines- Patient's IV insulin drip rate is less than 4 units/hour Patient has had 6 consecutive glucose readings less than 180 mg/dl  Patient's TPN rate is at goal and stable   MD- If decision made to transition patient off the IV insulin drip to SQ insulin, please use Phase 3 Transition Orders for the ICU Glycemic Control Protocol.  Recommend the following:  1. Start Lantus 15 units daily (~0.2 units/kg dosing)- Give Lantus at least 1-2 hours before the IV insulin drip is stopped  2. Start Novolog Sensitive correction scale 1-2-3 units Q4 hours per the ICU Glycemic Control Protocol order set  3. Start TPN coverage as well to cover the glucose in the TPN solution- Recommend Novolog 2 units Q4 hours to start      Will follow [name] RN, MSN, CDE Diabetes Coordinator Inpatient Glycemic Control Team Team Pager: [phone] (8a-5p)

## 2014-10-11 NOTE — Consult Note (Signed)
PULMONARY / CRITICAL CARE MEDICINE   Name: [name] MRN: [id number] DOB: 03-Jun-1950    ADMISSION DATE:  09/19/2014    CHIEF COMPLAINT:   resp failure   SIGNIFICANT EVENTS    Patient remains intubated,sedated. On PS modet, on multiple vasopressors,  on CRRT, patient with liver,renal and resp failure, prognosis is very poor  not tolerating TF's  S/p EGD shows erosive and severe esophagitis  Patient has failed SAT/SBt last several days due to resp muscle fatigue and encephlopathy daughter in law at bedside Will need to make decision regarding re-intubation and Trach and PEG tube Daughter in law at bedsid   [place]    :  Past Medical History  Diagnosis Date  . HTN (hypertension)   . Diabetes mellitus type II   . Obesity   . Arthritis   . Chronic kidney disease   . Amyloidosis    Past Surgical History  Procedure Laterality Date  . Total abdominal hysterectomy  10/1999    fibroids  . Combined abdominoplasty and liposuction  07/2000  . Total knee arthroplasty Right 09/28/2013    Procedure: RIGHT TOTAL KNEE ARTHROPLASTY;  Surgeon: Mauri Pole, MD;  Location: [facility];  Service: Orthopedics;  Laterality: Right;   Prior to Admission medications   Medication Sig Start Date End Date Taking? Authorizing Provider  metolazone (ZAROXOLYN) 5 MG tablet Take 5 mg by mouth daily.   Yes Historical Provider, MD  midodrine (PROAMATINE) 10 MG tablet Take 5 mg by mouth 3 (three) times daily.    Yes Historical Provider, MD  polyethylene glycol powder (GLYCOLAX/MIRALAX) powder Take 17 g by mouth daily as needed for moderate constipation. 09/20/14  Yes Abner Greenspan, MD  potassium chloride SA (K-DUR,KLOR-CON) 20 MEQ tablet Take 1 tablet (20 mEq total) by mouth daily. 07/25/14  Yes Abner Greenspan, MD  torsemide (DEMADEX) 20 MG tablet Take 20 mg by mouth 2 (two) times daily as needed (swelling).    Yes Historical Provider, MD   No Known Allergies   FAMILY HISTORY    Family History  Problem Relation Age of Onset  . Arthritis Mother   . Hypertension Mother   . Cancer Mother     breast, lung  . Diabetes Mother   . Hypertension Father       SOCIAL HISTORY    reports that she has never smoked. She has never used smokeless tobacco. She reports that she does not drink alcohol or use illicit drugs.  Review of Systems  Unable to perform ROS: critical illness      VITAL SIGNS    Temp:  [97.4 F (36.3 C)-98.4 F (36.9 C)] 98.4 F (36.9 C) (06/07 0700) Pulse Rate:  [88-94] 94 (06/07 0900) Resp:  [9-23] 18 (06/07 0900) BP: (84-108)/(51-72) 90/51 mmHg (06/07 0900) SpO2:  [94 %-100 %] 97 % (06/07 0900) FiO2 (%):  [35 %-38 %] 35 % (06/07 0900) Weight:  [184 lb 15.5 oz (83.9 kg)] 184 lb 15.5 oz (83.9 kg) (06/07 0500) HEMODYNAMICS:   VENTILATOR SETTINGS: Vent Mode:  [-] CPAP FiO2 (%):  [35 %-38 %] 35 % PEEP:  [5 cmH20] 5 cmH20 Pressure Support:  [5 cmH20-10 cmH20] 10 cmH20 INTAKE / OUTPUT:  Intake/Output Summary (Last 24 hours) at 10/11/14 0954 Last data filed at 10/11/14 0900  Gross per 24 hour  Intake 3278.71 ml  Output   6205 ml  Net -2926.29 ml       PHYSICAL EXAM   Physical Exam  Constitutional: She appears distressed.  HENT:  Head: Normocephalic and atraumatic.  Eyes: Pupils are equal, round, and reactive to light. No scleral icterus.  Neck: Normal range of motion. Neck supple.  Cardiovascular: Normal rate and regular rhythm.   No murmur heard. Pulmonary/Chest: No respiratory distress. She has no wheezes. She has rales.  resp distress  Abdominal: Soft. She exhibits no distension. There is no tenderness.  Musculoskeletal: She exhibits no edema.  Neurological: She displays normal reflexes. Coordination normal.  gcs<8T  Skin: Skin is warm. No rash noted. She is diaphoretic.       LABS   LABS:  CBC  Recent Labs Lab 10/09/14 0428 10/10/14 0533 10/11/14 0555  WBC 25.8* 17.6* 17.3*  HGB 9.1* 8.9* 9.5*   HCT 27.4* 26.5* 27.7*  PLT 72* 73* 86*   Coag's  Recent Labs Lab 10/05/14 0614 10/31/2014 0538  10/07/14 1120  10/09/14 0428 10/10/14 0533 10/11/14 0623  APTT 50* 42*  < > 41*  < > 46* 54* 52*  INR 3.00 2.25  --  2.03  --   --   --   --   < > = values in this interval not displayed. BMET  Recent Labs Lab 10/10/14 2251 10/11/14 0555 10/11/14 0624  NA 136 137 136  K 3.7 3.5 3.5  CL 102 104 102  CO2 26 29 27   BUN 38* 32* 32*  CREATININE SEE COMMENTS SEE COMMENTS SEE COMMENTS  GLUCOSE 169* 147* 125*   Electrolytes  Recent Labs Lab 10/09/14 0428  10/10/14 0533 10/10/14 1420 10/10/14 2251 10/11/14 0555 10/11/14 0624  CALCIUM 9.1  < > 8.7*  9.0 9.1 9.3 9.1 8.9  MG 2.1  --  1.8  --   --  1.7  --   PHOS  --   < > SEE COMMENTS  UNABLE TO REPORT DUE TO ICTERUS SEE COMMENTS SEE COMMENTS  --  SEE COMMENTS  < > = values in this interval not displayed. Sepsis Markers No results for input(s): LATICACIDVEN, PROCALCITON, O2SATVEN in the last 168 hours. ABG  Recent Labs Lab 10/07/14 1125 10/10/14 1155 10/11/14 0930  PHART 7.34* 7.34* 7.40  PCO2ART 51* 47 45  PO2ART 68* 76* 66*   Liver Enzymes  Recent Labs Lab 10/08/14 1409  10/10/14 0533  10/10/14 2251 10/11/14 0555 10/11/14 0624  AST 85*  --  117*  --   --  147*  --   ALT 33  --  40  --   --  50  --   ALKPHOS 702*  --  874*  --   --  973*  --   BILITOT 31.4*  --  37.4*  --   --  34.8*  --   ALBUMIN 3.0*  2.9*  < > 2.7*  2.7*  < > 2.5* 2.5* 2.3*  < > = values in this interval not displayed. Cardiac Enzymes No results for input(s): TROPONINI, PROBNP in the last 168 hours. Glucose  Recent Labs Lab 10/11/14 0236 10/11/14 0338 10/11/14 0438 10/11/14 0541 10/11/14 0636 10/11/14 0844  GLUCAP 153* 151* 156* 144* 104* 114*     Recent Results (from the past 240 hour(s))  Culture, blood (routine x 2)     Status: None   Collection Time: 10/04/14 11:50 AM  Result Value Ref Range Status   Specimen  Description BLOOD  Final   Special Requests NONE  Final   Culture NO GROWTH 5 DAYS  Final   Report Status 10/09/2014 FINAL  Final  Culture, blood (routine x 2)     Status: None   Collection Time: 10/04/14 12:00 PM  Result Value Ref Range Status   Specimen Description BLOOD  Final   Special Requests NONE  Final   Culture NO GROWTH 5 DAYS  Final   Report Status 10/09/2014 FINAL  Final  Culture, respiratory (NON-Expectorated)     Status: None   Collection Time: 10/04/14  2:00 PM  Result Value Ref Range Status   Specimen Description INDUCED SPUTUM  Final   Special Requests Normal  Final   Gram Stain   Final    EXCELLENT SPECIMEN - 90-100% WBCS MANY WBC SEEN MANY YEAST    Culture MODERATE GROWTH CANDIDA ALBICANS  Final   Report Status 10/07/2014 FINAL  Final     Current facility-administered medications:  .  Marland KitchenTPN (CLINIMIX-E) Adult, , Intravenous, Continuous TPN, Flora Lipps, MD, Last Rate: 70 mL/hr at 10/11/14 0900 .  acetaminophen (TYLENOL) tablet 650 mg, 650 mg, Oral, Q6H PRN **OR** acetaminophen (TYLENOL) suppository 650 mg, 650 mg, Rectal, Q6H PRN, Lytle Butte, MD, 650 mg at 10/03/14 1030 .  albuterol (PROVENTIL) (2.5 MG/3ML) 0.083% nebulizer solution 2.5 mg, 2.5 mg, Nebulization, Q6H PRN, Juluis Mire, MD, 2.5 mg at 10/03/14 0816 .  antiseptic oral rinse (CPC / CETYLPYRIDINIUM CHLORIDE 0.05%) solution 7 mL, 7 mL, Mouth Rinse, QID, Erby Pian, MD, 7 mL at 10/11/14 0447 .  budesonide (PULMICORT) nebulizer solution 0.5 mg, 0.5 mg, Nebulization, BID, Flora Lipps, MD, 0.5 mg at 10/11/14 0757 .  chlorhexidine (PERIDEX) 0.12 % solution 15 mL, 15 mL, Mouth Rinse, BID, Herbon London Pepper, MD, 15 mL at 10/11/14 0800 .  fentaNYL 2583mcg in NS 253mL (68mcg/ml) infusion-PREMIX, 10 mcg/hr, Intravenous, Continuous, Srikar Sudini, MD, Last Rate: 1 mL/hr at 10/11/14 0924, 10 mcg/hr at 10/11/14 0924 .  heparin injection 1,000-6,000 Units, 1,000-6,000 Units, CRRT, PRN, Murlean Iba, MD,  1,000 Units at 10/01/14 1435 .  insulin regular (NOVOLIN R,HUMULIN R) 250 Units in sodium chloride 0.9 % 250 mL (1 Units/mL) infusion, , Intravenous, Continuous, Flora Lipps, MD, Stopped at 10/11/14 0800 .  ipratropium-albuterol (DUONEB) 0.5-2.5 (3) MG/3ML nebulizer solution 3 mL, 3 mL, Nebulization, Q4H, Flora Lipps, MD, 3 mL at 10/11/14 0757 .  lactulose (CHRONULAC) 10 GM/15ML solution 20 g, 20 g, Oral, BID, Josefine Class, MD, 20 g at 10/11/14 [phone] .  levofloxacin (LEVAQUIN) IVPB 500 mg, 500 mg, Intravenous, Q24H, Adrian Prows, MD, 500 mg at 10/10/14 1729 .  midodrine (PROAMATINE) tablet 10 mg, 10 mg, Oral, TID, Lytle Butte, MD, 10 mg at 10/11/14 0948 .  norepinephrine (LEVOPHED) 16 mg in dextrose 5 % 250 mL (0.064 mg/mL) infusion, 0-40 mcg/min, Intravenous, Titrated, Srikar Sudini, MD, Last Rate: 13.1 mL/hr at 10/11/14 0900, 13.973 mcg/min at 10/11/14 0900 .  pantoprazole (PROTONIX) injection 40 mg, 40 mg, Intravenous, Q12H, Flora Lipps, MD, 40 mg at 10/10/14 2232 .  polyethylene glycol powder (GLYCOLAX/MIRALAX) container 17 g, 17 g, Oral, Daily PRN, Lytle Butte, MD .  pureflow IV solution for Dialysis, , CRRT, Continuous, Lavonia Dana, MD, Last Rate: 2,000 mL/hr at 10/11/14 0915 .  sodium chloride 0.9 % injection 10-40 mL, 10-40 mL, Intracatheter, Q12H, Srikar Sudini, MD, 30 mL at 10/10/14 2200 .  sodium chloride 0.9 % injection 10-40 mL, 10-40 mL, Intracatheter, PRN, Srikar Sudini, MD .  sucralfate (CARAFATE) 1 GM/10ML suspension 1 g, 1 g, Oral, 4 times per day, Josefine Class, MD, 1 g at 10/11/14 [phone]  IMAGING    No results found.    Indwelling Urinary Catheter continued, requirement due to   Reason to continue Indwelling Urinary Catheter for strict Intake/Output monitoring for hemodynamic instability   Central Line continued, requirement due to   Reason to continue [employer] Monitoring of central venous pressure or other hemodynamic parameters   Ventilator  continued, requirement due to, resp failure    Ventilator Sedation RASS 0 to -2      ASSESSMENT/PLAN   64 yo AAF admitted to ICU for acute septic shock and progressive resp failure with renal failure Patient with underlying Amyloidosis with progressive multiorgan failure   PULMONARY-Respiratory Failure-failed SAT/SBT due to resp muscle fatigue and encephlopathy -continue PS mode as tolerated -continue Bronchodilator Therapy -Wean Fio2 and PEEP as tolerated  CARDIOVASCULAR -septic shock-use vasopressors to keep MAP>65 -ABX as prescribed -stress dose steroids completed  RENAL-no urine output-at this time, patient not likely to recover kidney function -progressive renal failure from ATN -on CRRT -follow up Nephrology recs  GASTROINTESTINAL-GIB-severe erosive esophagitis, showing signs of intolerance to Tf's -bowel rest for now, continue TPN -liver failure-worsening -follow up Gi recs   HEMATOLOGIC -follow H/H s/p transfusion -follow INR s/p FFP  INFECTIOUS- -streptococcus bacteremia from cellulitis -on abx -repeat blood cultures no growth -follow up ID recs   ENDOCRINE -follow FSBS -on insulin drip  NEUROLOGIC -sedation as needed  NUTRITION-on TPN  I have personally obtained a history, examined the patient, evaluated laboratory and imaging results, formulated the assessment and plan and placed orders.  The Patient requires high complexity decision making for assessment and support, frequent evaluation and titration of therapies, application of advanced monitoring technologies and extensive interpretation of multiple databases. Critical Care Time devoted to patient care services described in this note is 45 minutes.   Overall, patient is critically ill, prognosis is very poor/guarded. Patient at high risk for cardiac arrest and death.   Palliative care team consulted-recommend DNR status, consider comfort care measures  I would like to attempt extubation  however, need plan of action from family regarding trach/PEG and re-intubation  Kameo Bains Patricia Pesa, M.D. [facility] [place] Director Intensive Care Unit   10/11/2014, 9:54 AM

## 2014-10-11 NOTE — Progress Notes (Signed)
Pt cont on CRRT. Tolerating well. Pt remains anuric. Cont. On levophed @14  mcg and remains off sedation, pt will nod head and follow commands at times. Pt Foley irrigated with 30 ml with bloody return  Further assessment via flowsheet

## 2014-10-11 NOTE — Progress Notes (Signed)
[name] at [place] NAME: [name]    MR#:  [id number]  DATE OF BIRTH:  [birth year]  SUBJECTIVE:  CHIEF COMPLAINT:   Chief Complaint  Patient presents with  . Altered Mental Status    Critically ill. On pressors. CRRT. Awake and following some commands.  REVIEW OF SYSTEMS:    ROS  Unable to give history due to encephalopathy patient intubated   DRUG ALLERGIES:  No Known Allergies  VITALS:  Blood pressure 90/51, pulse 94, temperature 98.4 F (36.9 C), temperature source Oral, resp. rate 18, height 5\' 1"  (1.549 m), weight 83.9 kg (184 lb 15.5 oz), SpO2 97 %.  PHYSICAL EXAMINATION:   Physical Exam  GENERAL:  64 y.o.-year-old patient lying in the bed critically ill EYES: Pupils equal, round, reactive to light and accommodation. + scleral icterus.  HEENT: Head atraumatic, normocephalic.Intubated NECK:  Supple, no jugular venous distention. No thyroid enlargement, no tenderness. Right IJ . Right femoral Dialysis catheter. LUNGS: coarse breath sounds, poor air movement, vent CARDIOVASCULAR: S1, S2 normal. No murmurs,  + rub, - gallops. ABDOMEN: Soft, nontender, nondistended. Bowel sounds decreased. No organomegaly or mass. EXTREMITIES: No cyanosis, clubbing. Anasarca.  NEUROLOGIC:  Opens eyes, follow commands PSYCHIATRIC: The patient is drowzy. SKIN:  Superficial ulcer right thigh.  LABORATORY PANEL:   CBC  Recent Labs Lab 10/11/14 0555  WBC 17.3*  HGB 9.5*  HCT 27.7*  PLT 86*   ------------------------------------------------------------------------------------------------------------------  Chemistries   Recent Labs Lab 10/11/14 0555 10/11/14 0624  NA 137 136  K 3.5 3.5  CL 104 102  CO2 29 27  GLUCOSE 147* 125*  BUN 32* 32*  CREATININE SEE COMMENTS SEE COMMENTS  CALCIUM 9.1 8.9  MG 1.7  --   AST 147*  --   ALT 50  --   ALKPHOS 973*  --   BILITOT 34.8*  --     ------------------------------------------------------------------------------------------------------------------  Cardiac Enzymes No results for input(s): TROPONINI in the last 168 hours. ------------------------------------------------------------------------------------------------------------------  RADIOLOGY:  Dg Chest Port 1 View  10/07/2014   IMPRESSION: 1. Worsened bilateral airspace opacities. In the context of the patient's moderate enlargement of the cardiopericardial silhouette, the appearance is suspicious for acute pulmonary edema. 2. The endotracheal tube has been successfully retracted, and the tubes and lines appear satisfactorily positioned.   Electronically Signed   By: [name] M.D.   On: 10/07/2014 08:42     ASSESSMENT AND PLAN:   64 year old African American female history of nephrotic syndrome, amyloidosis, essential hypertension presenting on 5/25 with altered mental status/confusion after apparently falling out of bed. Admitted for septic shock  * Septic shock with right flank/thigh cellulitis with streptococcus bacteremia - ID following - Changed to Levaquin and clindamycin stopped. Continue pressors. Off steroids. - Echo- No vegetations. - Repeat blood cultures negative to date.  * Acute renal failure over CKD3 due to ATN. From septic shock in the setting of amyloidosis On CRRT. Appreciate nephrology help. Severe hypoalbuminemia. Getting IV albumin. Will likely progress to ESRD.  * Acute liver failure- Due to sepsis - No improvement CT abd/pelvis showed no biliary dilation or obstruction Severe hyperbilirubinemia > 30. Gastroenterology following.  lactulose  * Elevated INR - Due to liver failure. DIC unlikely per oncology. 4 units FFP given 6/1 minimal improvement in INR  * Acute encephalopathy - Due to acute illness. CT head nothing acute.  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 Plan was for chemo as OP with Dr. Grayland Hartman  but  now on hold due to acute illness. Oncology following  * Upper GI bleed with Acute blood loss  anemia over AOCD EGD showed severe esophagitis. On protonix  IV Transfused 4 units this admission.  * Type 2 diabetes, uncomplicated: Hold oral agents, at insulin slide scale every 6 hours Accu-Chek  * Essential hypertension: Held all meds  * Venous thromboembolism prophylactic: Heparin subcutaneous stopped due to high INR and low platelets and GIB  * Nutrition TFs  * Acute respiratory failure -Full vent support - due to suspected aspiration of gastrointestinal content - Pulmonology following - Full ventilator support  CODE STATUS: FULL CODE  Multi organ failure. High risk for cardiac arrest and death   TOTAL CRITICAL CARE TIME TAKING CARE OF THIS PATIENT: 37 minutes.    [name] R M.D on 10/11/2014 at 9:33 AM  Between 7am to 6pm - Pager - [phone]  After 6pm go to [email or web address] - password [email or web address] [facility]  [facility]  Office  [phone]  CC: Primary care physician; Gina Pardon, MD

## 2014-10-11 NOTE — Progress Notes (Signed)
Subjective:  Remains critically ill. Family at bedside, 2 neighbors and daughter in law Still on CRRT. UF 250 cc/hr 2 K bath NG placed to suction:  Remains anuric. TPN 70 cc/hr Norepinephrine 16 On Pressure Support FiO2 35% Off fentanyl this morning, opening eyes  Objective:  Vital signs in last 24 hours:  Temp:  [97.4 F (36.3 C)-98.4 F (36.9 C)] 98.4 F (36.9 C) (06/07 0700) Pulse Rate:  [88-95] 95 (06/07 1100) Resp:  [9-23] 18 (06/07 1100) BP: (84-108)/(51-68) 85/52 mmHg (06/07 1100) SpO2:  [94 %-100 %] 97 % (06/07 1100) FiO2 (%):  [35 %] 35 % (06/07 1100) Weight:  [83.9 kg (184 lb 15.5 oz)] 83.9 kg (184 lb 15.5 oz) (06/07 0500)  Weight change: -1 kg (-2 lb 3.3 oz) Filed Weights   10/09/14 0500 10/10/14 0405 10/11/14 0500  Weight: 92.7 kg (204 lb 5.9 oz) 84.9 kg (187 lb 2.7 oz) 83.9 kg (184 lb 15.5 oz)    Intake/Output: I/O last 3 completed shifts: In: 5259.4 [I.V.:1249.4; NG/GT:380; IV Piggyback:200] Out: 1324 [Urine:40; Emesis/NG output:1750; Other:8146]     Physical Exam: General: Critically ill appearing  HEENT Scleral icterus noted, ETT in place, NGT to suction  Neck supple  Pulm/lungs Decreased breath sounds at bases, vent assisted: PS FiO2 35%  CVS/Heart S1S2 no rubs  Abdomen:  distended, tense, decreased Bowel sounds  Extremities: 3+ anasarca  Neurologic: On the vent  Skin: Cools extremities  Access: Temp cath- rt femoral 5/25 Dr. Delana Meyer       Basic Metabolic Panel:  Recent Labs Lab 10/07/14 4010  10/08/14 2725  10/09/14 3664  10/09/14 2345 10/10/14 0533 10/10/14 1420 10/10/14 2251 10/11/14 0555 10/11/14 0624  NA  --   < > 136  < > 136  < > 133* 132*  134* 135 136 137 136  K  --   < > 3.8  < > 4.6  < > 4.4 4.5  4.5 4.2 3.7 3.5 3.5  CL  --   < > 105  < > 104  < > 102 102  103 103 102 104 102  CO2  --   < > 26  < > 26  < > 25 24  24 25 26 29 27   GLUCOSE  --   < > 146*  < > 178*  < > 171* 224*  198* 120* 169* 147* 125*  BUN  --   <  > 39*  < > 47*  < > 50* 53*  53* 46* 38* 32* 32*  CREATININE  --   < > SEE COMMENTS  < > SEE COMMENTS  < > SEE COMMENTS UNABLE TO REPORT DUE TO ICTERUS  SEE COMMENTS SEE COMMENTS SEE COMMENTS SEE COMMENTS SEE COMMENTS  CALCIUM  --   < > 9.0  < > 9.1  < > 9.0 8.7*  9.0 9.1 9.3 9.1 8.9  MG 1.9  --  2.0  --  2.1  --   --  1.8  --   --  1.7  --   PHOS  --   < > SEE COMMENT  < >  --   < > SEE COMMENTS SEE COMMENTS  UNABLE TO REPORT DUE TO ICTERUS SEE COMMENTS SEE COMMENTS  --  SEE COMMENTS  < > = values in this interval not displayed.   CBC:  Recent Labs Lab 10/07/14 0458 10/07/14 1927 10/08/14 0437 10/09/14 0428 10/10/14 0533 10/11/14 0555  WBC 23.4*  --  26.5* 25.8* 17.6*  17.3*  HGB 6.8* 8.8* 9.0* 9.1* 8.9* 9.5*  HCT 21.2* 25.9* 26.5* 27.4* 26.5* 27.7*  MCV 87.9  --  90.2 91.9 91.4 91.3  PLT 63*  --  75* 72* 73* 86*      Microbiology: Results for orders placed or performed during the hospital encounter of 09/06/2014  Blood culture (routine x 2)     Status: None   Collection Time: 09/09/2014 12:45 AM  Result Value Ref Range Status   Specimen Description BLOOD  Final   Special Requests NONE  Final   Culture  Setup Time   Final    GRAM POSITIVE COCCI IN CHAINS IN BOTH AEROBIC AND ANAEROBIC BOTTLES CRITICAL RESULT CALLED TO, READ BACK BY AND VERIFIED WITH: PAM CRAWFORD AT 1406 09/17/2014 DV    Culture STREPTOCOCCUS SPECIES  Final   Report Status 10/01/2014 FINAL  Final   Organism ID, Bacteria STREPTOCOCCUS SPECIES  Final      Susceptibility   Streptococcus species - MIC (ETEST)*    ERYTHROMYCIN Value in next row Resistant      RESISTANT6    PENICILLIN Value in next row Intermediate      INTERMEDIATE0.25    CEFTRIAXONE Value in next row Sensitive      SENSITIVE0.047    VANCOMYCIN Value in next row Sensitive      SENSITIVE1.0    LEVOFLOXACIN Value in next row Sensitive      SENSITIVE1.0    * STREPTOCOCCUS SPECIES  Blood culture (routine x 2)     Status: None   Collection  Time: 09/04/2014 12:45 AM  Result Value Ref Range Status   Specimen Description BLOOD  Final   Special Requests NONE  Final   Culture  Setup Time   Final    GRAM POSITIVE COCCI IN CHAINS IN BOTH AEROBIC AND ANAEROBIC BOTTLES CRITICAL RESULT CALLED TO, READ BACK BY AND VERIFIED WITH: PAM CRAWFORD AT 1406 09/16/2014 DV    Culture   Final    STREPTOCOCCUS SPECIES REFER TO ACCESSION [id number] COLLECTED ON 09/11/2014 AT 0045 FOR SENSITIVITIES    Report Status 09/30/2014 FINAL  Final  Culture, blood (routine x 2)     Status: None   Collection Time: 10/04/14 11:50 AM  Result Value Ref Range Status   Specimen Description BLOOD  Final   Special Requests NONE  Final   Culture NO GROWTH 5 DAYS  Final   Report Status 10/09/2014 FINAL  Final  Culture, blood (routine x 2)     Status: None   Collection Time: 10/04/14 12:00 PM  Result Value Ref Range Status   Specimen Description BLOOD  Final   Special Requests NONE  Final   Culture NO GROWTH 5 DAYS  Final   Report Status 10/09/2014 FINAL  Final  Culture, respiratory (NON-Expectorated)     Status: None   Collection Time: 10/04/14  2:00 PM  Result Value Ref Range Status   Specimen Description INDUCED SPUTUM  Final   Special Requests Normal  Final   Gram Stain   Final    EXCELLENT SPECIMEN - 90-100% WBCS MANY WBC SEEN MANY YEAST    Culture MODERATE GROWTH CANDIDA ALBICANS  Final   Report Status 10/07/2014 FINAL  Final    Coagulation Studies: No results for input(s): LABPROT, INR in the last 72 hours.  Urinalysis: No results for input(s): COLORURINE, LABSPEC, PHURINE, GLUCOSEU, HGBUR, BILIRUBINUR, KETONESUR, PROTEINUR, UROBILINOGEN, NITRITE, LEUKOCYTESUR in the last 72 hours.  Invalid input(s): APPERANCEUR    Imaging: No results  found.   Medications:   . Marland KitchenTPN (CLINIMIX-E) Adult 70 mL/hr at 10/11/14 1100  . fentaNYL infusion INTRAVENOUS 50 mcg/hr (10/11/14 1100)  . insulin (NOVOLIN-R) infusion Stopped (10/11/14 0800)  . norepinephrine  (LEVOPHED) Adult infusion 16 mcg/min (10/11/14 1100)  . pureflow 2,000 mL/hr at 10/11/14 0915   . antiseptic oral rinse  7 mL Mouth Rinse QID  . budesonide (PULMICORT) nebulizer solution  0.5 mg Nebulization BID  . chlorhexidine  15 mL Mouth Rinse BID  . ipratropium-albuterol  3 mL Nebulization Q4H  . lactulose  20 g Oral BID  . levofloxacin (LEVAQUIN) IV  500 mg Intravenous Q24H  . methylPREDNISolone (SOLU-MEDROL) injection  20 mg Intravenous Q6H  . midodrine  10 mg Oral TID  . pantoprazole (PROTONIX) IV  40 mg Intravenous Q12H  . sodium chloride  10-40 mL Intracatheter Q12H  . sucralfate  1 g Oral 4 times per day   acetaminophen **OR** acetaminophen, albuterol, heparin, polyethylene glycol powder, sodium chloride  Assessment/ Plan:   64 y.o. African [place] female with diabetes mellitus type 2, hypertension, morbid obesity, osteoarthritis, status post right knee surgery, Amyloidosis with nephrotic syndrome with edema, hypoalbuminemia, renal insufficiency and proteinuria.  1. ARF with CKD st 3 . Baseline cr 1.3. (08/2014)  2. Nephrotic Syndrome with proteinuria: Renal Amyloidosis.  3. Generalized Edema 4. Hypoalbuminemia 5. Right groin cellulitis /sepsis- strep species  6. Diabetes Mellitus type II 7. Hypotension 8. Acute Respiratory Failure with mechanical ventilation  Plan: Patient remains essentially anuric at this point in time. She has tolerated CRRT well. Continue vasopressors. Hemodynamically unstable.  Overall prognosis quite poor.  LOS: [place], [place] 6/7/201611:15 AM

## 2014-10-12 DIAGNOSIS — L8990 Pressure ulcer of unspecified site, unspecified stage: Secondary | ICD-10-CM | POA: Diagnosis present

## 2014-10-12 LAB — RENAL FUNCTION PANEL
ALBUMIN: 2.3 g/dL — AB (ref 3.5–5.0)
ANION GAP: 6 (ref 5–15)
Albumin: 2.3 g/dL — ABNORMAL LOW (ref 3.5–5.0)
Albumin: 2.2 g/dL — ABNORMAL LOW (ref 3.5–5.0)
Albumin: 2.1 g/dL — ABNORMAL LOW (ref 3.5–5.0)
Anion gap: 5 (ref 5–15)
Anion gap: 5 (ref 5–15)
Anion gap: 7 (ref 5–15)
BUN: 28 mg/dL — ABNORMAL HIGH (ref 6–20)
BUN: 28 mg/dL — ABNORMAL HIGH (ref 6–20)
BUN: 28 mg/dL — AB (ref 6–20)
BUN: 26 mg/dL — ABNORMAL HIGH (ref 6–20)
CALCIUM: 9 mg/dL (ref 8.9–10.3)
CHLORIDE: 101 mmol/L (ref 101–111)
CHLORIDE: 103 mmol/L (ref 101–111)
CO2: 28 mmol/L (ref 22–32)
CO2: 27 mmol/L (ref 22–32)
CO2: 27 mmol/L (ref 22–32)
CO2: 28 mmol/L (ref 22–32)
Calcium: 9.1 mg/dL (ref 8.9–10.3)
Calcium: 9.2 mg/dL (ref 8.9–10.3)
Calcium: 9.3 mg/dL (ref 8.9–10.3)
Chloride: 104 mmol/L (ref 101–111)
Chloride: 104 mmol/L (ref 101–111)
Creatinine, Ser: UNDETERMINED mg/dL (ref 0.44–1.00)
Creatinine, Ser: UNDETERMINED mg/dL (ref 0.44–1.00)
GLUCOSE: 203 mg/dL — AB (ref 65–99)
Glucose, Bld: 191 mg/dL — ABNORMAL HIGH (ref 65–99)
Glucose, Bld: 167 mg/dL — ABNORMAL HIGH (ref 65–99)
Glucose, Bld: 224 mg/dL — ABNORMAL HIGH (ref 65–99)
POTASSIUM: 4.3 mmol/L (ref 3.5–5.1)
POTASSIUM: 4.3 mmol/L (ref 3.5–5.1)
POTASSIUM: 4.3 mmol/L (ref 3.5–5.1)
Phosphorus: UNDETERMINED mg/dL (ref 2.5–4.6)
Phosphorus: UNDETERMINED mg/dL (ref 2.5–4.6)
Potassium: 4.8 mmol/L (ref 3.5–5.1)
SODIUM: 137 mmol/L (ref 135–145)
Sodium: 136 mmol/L (ref 135–145)
Sodium: 135 mmol/L (ref 135–145)
Sodium: 137 mmol/L (ref 135–145)

## 2014-10-12 LAB — COMPREHENSIVE METABOLIC PANEL
ALK PHOS: 1227 U/L — AB (ref 38–126)
ALT: 62 U/L — ABNORMAL HIGH (ref 14–54)
AST: 208 U/L — ABNORMAL HIGH (ref 15–41)
Albumin: 2.3 g/dL — ABNORMAL LOW (ref 3.5–5.0)
Anion gap: 8 (ref 5–15)
BUN: 28 mg/dL — ABNORMAL HIGH (ref 6–20)
CO2: 28 mmol/L (ref 22–32)
Calcium: 9.6 mg/dL (ref 8.9–10.3)
Chloride: 102 mmol/L (ref 101–111)
Glucose, Bld: 140 mg/dL — ABNORMAL HIGH (ref 65–99)
POTASSIUM: 4.3 mmol/L (ref 3.5–5.1)
Sodium: 138 mmol/L (ref 135–145)
Total Bilirubin: 35.9 mg/dL (ref 0.3–1.2)
Total Protein: 6.8 g/dL (ref 6.5–8.1)

## 2014-10-12 LAB — CBC
HEMATOCRIT: 27.9 % — AB (ref 35.0–47.0)
HEMOGLOBIN: 9.4 g/dL — AB (ref 12.0–16.0)
MCH: 30.7 pg (ref 26.0–34.0)
MCHC: 33.6 g/dL (ref 32.0–36.0)
MCV: 91.4 fL (ref 80.0–100.0)
Platelets: 91 10*3/uL — ABNORMAL LOW (ref 150–440)
RBC: 3.05 MIL/uL — ABNORMAL LOW (ref 3.80–5.20)
RDW: 17.2 % — ABNORMAL HIGH (ref 11.5–14.5)
WBC: 20.8 10*3/uL — AB (ref 3.6–11.0)

## 2014-10-12 LAB — GLUCOSE, CAPILLARY
GLUCOSE-CAPILLARY: 164 mg/dL — AB (ref 65–99)
GLUCOSE-CAPILLARY: 133 mg/dL — AB (ref 65–99)
GLUCOSE-CAPILLARY: 140 mg/dL — AB (ref 65–99)
GLUCOSE-CAPILLARY: 155 mg/dL — AB (ref 65–99)
GLUCOSE-CAPILLARY: 166 mg/dL — AB (ref 65–99)
GLUCOSE-CAPILLARY: 153 mg/dL — AB (ref 65–99)
Glucose-Capillary: 141 mg/dL — ABNORMAL HIGH (ref 65–99)
Glucose-Capillary: 142 mg/dL — ABNORMAL HIGH (ref 65–99)
Glucose-Capillary: 144 mg/dL — ABNORMAL HIGH (ref 65–99)
Glucose-Capillary: 146 mg/dL — ABNORMAL HIGH (ref 65–99)
Glucose-Capillary: 149 mg/dL — ABNORMAL HIGH (ref 65–99)
Glucose-Capillary: 149 mg/dL — ABNORMAL HIGH (ref 65–99)
Glucose-Capillary: 154 mg/dL — ABNORMAL HIGH (ref 65–99)
Glucose-Capillary: 156 mg/dL — ABNORMAL HIGH (ref 65–99)
Glucose-Capillary: 160 mg/dL — ABNORMAL HIGH (ref 65–99)
Glucose-Capillary: 170 mg/dL — ABNORMAL HIGH (ref 65–99)
Glucose-Capillary: 172 mg/dL — ABNORMAL HIGH (ref 65–99)
Glucose-Capillary: 185 mg/dL — ABNORMAL HIGH (ref 65–99)
Glucose-Capillary: 187 mg/dL — ABNORMAL HIGH (ref 65–99)
Glucose-Capillary: 201 mg/dL — ABNORMAL HIGH (ref 65–99)

## 2014-10-12 LAB — PROTIME-INR
INR: 2.09
PROTHROMBIN TIME: 23.6 s — AB (ref 11.4–15.0)

## 2014-10-12 LAB — MAGNESIUM: MAGNESIUM: 1.7 mg/dL (ref 1.7–2.4)

## 2014-10-12 LAB — APTT: aPTT: 50 seconds — ABNORMAL HIGH (ref 24–36)

## 2014-10-12 MED ORDER — FAT EMULSION 20 % IV EMUL
500.0000 mL | INTRAVENOUS | Status: AC
  Administered 2014-10-13: 500 mL via INTRAVENOUS
  Filled 2014-10-12 (×3): qty 500

## 2014-10-12 MED ORDER — SODIUM CHLORIDE 0.9 % IV SOLN
250.0000 mg | Freq: Three times a day (TID) | INTRAVENOUS | Status: DC
  Administered 2014-10-12 – 2014-10-18 (×18): 250 mg via INTRAVENOUS
  Filled 2014-10-12 (×39): qty 5

## 2014-10-12 MED ORDER — VASOPRESSIN 20 UNIT/ML IV SOLN
0.0300 [IU]/min | INTRAVENOUS | Status: DC
  Administered 2014-10-12 – 2014-10-17 (×6): 0.03 [IU]/min via INTRAVENOUS
  Filled 2014-10-12 (×7): qty 2

## 2014-10-12 MED ORDER — TRACE MINERALS CR-CU-MN-SE-ZN 10-1000-500-60 MCG/ML IV SOLN
INTRAVENOUS | Status: AC
  Administered 2014-10-12: 20:00:00 via INTRAVENOUS
  Filled 2014-10-12: qty 1680

## 2014-10-12 NOTE — Progress Notes (Signed)
Patient remains intubated on ventilator, sedated for pain/comfort with fentanyl. Sedation wean this am, patient nodding head and squeezing bilateral hands on command, resedated due to work of breathing. Tolerating CRRT this shift with cartridge change. Vasopressin added per Kasa, levophed titrated down. Insulin drip infusing. Blood cx x 2 collected per lab. OG clamped per Kasa, tolerating at this time. Daughter-in-law at bedside throughout shift, son present in evening, both supportive of patient. ENT consult completed with Dr Richardson Landry. Resting quietly between care. Will continue to monitor.

## 2014-10-12 NOTE — Consult Note (Signed)
[name], [place] [id number] 1951-03-26 Hillary Bow, MD  Reason for Consult: Respiratory failure, requesting tracheostomy Requesting Physician: @PHYSICIAN @ Consulting Physician: Riley Nearing  HPI: This 64 y.o. year old @GENDER @ was admitted on 09/07/2014 for Abdominal wall cellulitis [J28.786] Sepsis, due to unspecified organism [A41.9]. Patient was admitted after a fall at home with mental status changes. She was in septic shock due to a streptococcal infection, and this was subsequently complicated by multiorgan failure including kidney, liver and respiratory failure. She was intubated 5/30 and had failed to wean from the vent. Unfortunately she also has elevated coags due to the liver failure, which did not improve significantly with FFP.   Medications:  . antiseptic oral rinse  7 mL Mouth Rinse QID  . budesonide (PULMICORT) nebulizer solution  0.5 mg Nebulization BID  . chlorhexidine  15 mL Mouth Rinse BID  . erythromycin  250 mg Intravenous 3 times per day  . ipratropium-albuterol  3 mL Nebulization Q4H  . lactulose  20 g Oral BID  . levofloxacin (LEVAQUIN) IV  500 mg Intravenous Q24H  . midodrine  10 mg Oral TID  . pantoprazole (PROTONIX) IV  40 mg Intravenous Q12H  . sodium chloride  10-40 mL Intracatheter Q12H  . sucralfate  1 g Oral 4 times per day  .  Marland Kitchen Marland KitchenTPN (CLINIMIX-E) Adult 70 mL/hr at 10/12/14 0721  . Marland KitchenTPN (CLINIMIX-E) Adult    . [START ON 10/13/2014] fat emulsion    . fentaNYL infusion INTRAVENOUS 100 mcg/hr (10/12/14 0900)  . insulin (NOVOLIN-R) infusion 3.4 Units/hr (10/12/14 1700)  . norepinephrine (LEVOPHED) Adult infusion 10 mcg/min (10/12/14 1520)  . pureflow 2,000 mL/hr at 10/12/14 1408  . vasopressin (PITRESSIN) infusion - *FOR SHOCK* 0.03 Units/min (10/12/14 1224)  . acetaminophen **OR** acetaminophen, albuterol, heparin, polyethylene glycol powder, sodium chloride.  Allergies: No Known Allergies  PMH:  Past Medical History  Diagnosis Date  . HTN  (hypertension)   . Diabetes mellitus type II   . Obesity   . Arthritis   . Chronic kidney disease   . Amyloidosis     PSH:  Past Surgical History  Procedure Laterality Date  . Total abdominal hysterectomy  10/1999    fibroids  . Combined abdominoplasty and liposuction  07/2000  . Total knee arthroplasty Right 09/28/2013    Procedure: RIGHT TOTAL KNEE ARTHROPLASTY;  Surgeon: Mauri Pole, MD;  Location: [facility];  Service: Orthopedics;  Laterality: Right;  . Procedures since admission: ESOPHAGOGASTRODUODENOSCOPY (EGD) ESOPHAGOGASTRODUODENOSCOPY (EGD)  Fam Hx:  Family History  Problem Relation Age of Onset  . Arthritis Mother   . Hypertension Mother   . Cancer Mother     breast, lung  . Diabetes Mother   . Hypertension Father     Soc Hx:  History   Social History  . Marital Status: Single    Spouse Name: N/A  . Number of Children: 1  . Years of Education: N/A   Occupational History  . Not on file.   Social History Main Topics  . Smoking status: Never Smoker   . Smokeless tobacco: Never Used  . Alcohol Use: No  . Drug Use: No  . Sexual Activity: Not on file   Other Topics Concern  . Not on file   Social History Narrative    ROS: Unobtainable as patient is intubated and sedated PHYSICAL EXAM  Vitals: Blood pressure 91/54, pulse 94, temperature 97.4 F (36.3 C), temperature source Axillary, resp. rate 14, height 5\' 1"  (1.549 m), weight 76.4 kg (  168 lb 6.9 oz), SpO2 98 %.   General: Patient is intubated and sedated on the ventilator. Thin, cachectic. Mood: Sedated. Orientation: Sedated.. Vocal Quality: Unable to assess. Patient intubated. head and Face: NCAT. No facial asymmetry. No visible skin lesions. No significant facial scars. No apparent tenderness with sinus percussion. Facial strength cannot be assessed. Ears: External ears with normal landmarks, no lesions. External auditory canals free of infection, cerumen impaction or lesions. Tympanic membranes  intact with good landmarks and normal mobility on pneumatic otoscopy. No middle ear effusion. Hearing: Unable to assess. Nose: External nose normal with midline dorsum and no lesions or deformity. Nasal Cavity reveals essentially midline septum with normal inferior turbinates. No significant mucosal congestion or erythema. Nasal secretions are minimal and clear. No polyps seen on anterior rhinoscopy. Oral Cavity/ Oropharynx: Lips are normal with no lesions. Teeth no frank dental caries. Gingiva healthy with no lesions or gingivitis. Oropharynx including tongue, buccal mucosa, floor of mouth, hard and soft palate, uvula and posterior pharynx appear free of exudates, erythema or lesions with normal symmetry, however exam is limited due to presence of an endotracheal tube. Indirect Laryngoscopy/Nasopharyngoscopy: Visualization of the larynx, hypopharynx and nasopharynx is not possible in this setting with routine examination. Neck: Supple and symmetric with no palpable masses, tenderness or crepitance. The trachea is midline. Thyroid gland is soft, nontender and symmetric with no masses or enlargement. Parotid and submandibular glands are soft, nontender and symmetric, without masses. Lymphatic: Cervical lymph nodes are without palpable lymphadenopathy or tenderness. Respiratory: Patient is intubated with ventilator support. Cardiovascular: Carotid pulse shows regular rate and rhythm. Neurologic: Unable to assess in this sedated patient. Eyes: Unable to assess in this sedated patient.  MEDICAL DECISION MAKING: Data Review:  Results for orders placed or performed during the hospital encounter of 09/08/2014 (from the past 48 hour(s))  Glucose, capillary     Status: Abnormal   Collection Time: 10/10/14  5:32 PM  Result Value Ref Range   Glucose-Capillary 161 (H) 65 - 99 mg/dL  Glucose, capillary     Status: Abnormal   Collection Time: 10/10/14  6:34 PM  Result Value Ref Range   Glucose-Capillary 189  (H) 65 - 99 mg/dL  Glucose, capillary     Status: Abnormal   Collection Time: 10/10/14  6:46 PM  Result Value Ref Range   Glucose-Capillary 201 (H) 65 - 99 mg/dL  Glucose, capillary     Status: Abnormal   Collection Time: 10/10/14  7:37 PM  Result Value Ref Range   Glucose-Capillary 168 (H) 65 - 99 mg/dL  Glucose, capillary     Status: Abnormal   Collection Time: 10/10/14  8:41 PM  Result Value Ref Range   Glucose-Capillary 157 (H) 65 - 99 mg/dL  Glucose, capillary     Status: Abnormal   Collection Time: 10/10/14  9:32 PM  Result Value Ref Range   Glucose-Capillary 141 (H) 65 - 99 mg/dL  Glucose, capillary     Status: Abnormal   Collection Time: 10/10/14 10:42 PM  Result Value Ref Range   Glucose-Capillary 140 (H) 65 - 99 mg/dL  Renal function panel     Status: Abnormal   Collection Time: 10/10/14 10:51 PM  Result Value Ref Range   Sodium 136 135 - 145 mmol/L   Potassium 3.7 3.5 - 5.1 mmol/L   Chloride 102 101 - 111 mmol/L   CO2 26 22 - 32 mmol/L   Glucose, Bld 169 (H) 65 - 99 mg/dL   BUN 38 (  H) 6 - 20 mg/dL   Creatinine, Ser SEE COMMENTS 0.44 - 1.00 mg/dL    Comment: UNABLE TO REPORT DUE TO ICTERUS SAMPLE CORRECTED ON 06/07 AT 0747: PREVIOUSLY REPORTED AS NOT VALID UNABLE TO REPORT DUE TO ICTERUS SAMPLE    Calcium 9.3 8.9 - 10.3 mg/dL   Phosphorus SEE COMMENTS 2.5 - 4.6 mg/dL    Comment: UNABLE TO PERFORM DUE TO ICTERUS INTERFERENCE CORRECTED ON 06/07 AT 0747: PREVIOUSLY REPORTED AS Null_Result NOT VALID UNABLE TO REPORT DUE TO ICTERUS UNABLE TO PERFORM DUE TO ICTERUS INTERFERENCE    Albumin 2.5 (L) 3.5 - 5.0 g/dL   GFR calc non Af Amer NOT CALCULATED >60 mL/min   GFR calc Af Amer NOT CALCULATED >60 mL/min    Comment: (NOTE) The eGFR has been calculated using the CKD EPI equation. This calculation has not been validated in all clinical situations. eGFR's persistently <60 mL/min signify possible Chronic Kidney Disease.    Anion gap 8 5 - 15  Glucose, capillary      Status: Abnormal   Collection Time: 10/10/14 11:42 PM  Result Value Ref Range   Glucose-Capillary 155 (H) 65 - 99 mg/dL  Glucose, capillary     Status: Abnormal   Collection Time: 10/11/14 12:38 AM  Result Value Ref Range   Glucose-Capillary 147 (H) 65 - 99 mg/dL  Glucose, capillary     Status: Abnormal   Collection Time: 10/11/14  1:41 AM  Result Value Ref Range   Glucose-Capillary 151 (H) 65 - 99 mg/dL  Glucose, capillary     Status: Abnormal   Collection Time: 10/11/14  2:36 AM  Result Value Ref Range   Glucose-Capillary 153 (H) 65 - 99 mg/dL  Glucose, capillary     Status: Abnormal   Collection Time: 10/11/14  3:38 AM  Result Value Ref Range   Glucose-Capillary 151 (H) 65 - 99 mg/dL  Glucose, capillary     Status: Abnormal   Collection Time: 10/11/14  4:38 AM  Result Value Ref Range   Glucose-Capillary 156 (H) 65 - 99 mg/dL  Glucose, capillary     Status: Abnormal   Collection Time: 10/11/14  5:41 AM  Result Value Ref Range   Glucose-Capillary 144 (H) 65 - 99 mg/dL  Magnesium     Status: None   Collection Time: 10/11/14  5:55 AM  Result Value Ref Range   Magnesium 1.7 1.7 - 2.4 mg/dL  CBC     Status: Abnormal   Collection Time: 10/11/14  5:55 AM  Result Value Ref Range   WBC 17.3 (H) 3.6 - 11.0 K/uL    Comment: REVIEWED BY PATHOLOGIST   RBC 3.04 (L) 3.80 - 5.20 MIL/uL   Hemoglobin 9.5 (L) 12.0 - 16.0 g/dL   HCT 27.7 (L) 35.0 - 47.0 %   MCV 91.3 80.0 - 100.0 fL   MCH 31.3 26.0 - 34.0 pg   MCHC 34.3 32.0 - 36.0 g/dL   RDW 16.8 (H) 11.5 - 14.5 %   Platelets 86 (L) 150 - 440 K/uL  Comprehensive metabolic panel     Status: Abnormal   Collection Time: 10/11/14  5:55 AM  Result Value Ref Range   Sodium 137 135 - 145 mmol/L   Potassium 3.5 3.5 - 5.1 mmol/L   Chloride 104 101 - 111 mmol/L   CO2 29 22 - 32 mmol/L   Glucose, Bld 147 (H) 65 - 99 mg/dL   BUN 32 (H) 6 - 20 mg/dL   Creatinine, Ser SEE  COMMENTS 0.44 - 1.00 mg/dL    Comment: UNABLE TO REPORT DUE TO ICTERUS  INTERFERENCE   Calcium 9.1 8.9 - 10.3 mg/dL   Total Protein 6.7 6.5 - 8.1 g/dL   Albumin 2.5 (L) 3.5 - 5.0 g/dL   AST 147 (H) 15 - 41 U/L   ALT 50 14 - 54 U/L    Comment: ICTERUS AT THIS LEVEL MAY AFFECT RESULT   Alkaline Phosphatase 973 (H) 38 - 126 U/L   Total Bilirubin 34.8 (HH) 0.3 - 1.2 mg/dL    Comment: CRITICAL RESULT CALLED TO, READ BACK BY AND VERIFIED WITH: PAM MYERS ON 10/11/14 AT 0748 BY TB RESULTS CONFIRMED BY MANUAL DILUTION    GFR calc non Af Amer NOT CALCULATED >60 mL/min   GFR calc Af Amer NOT CALCULATED >60 mL/min    Comment: (NOTE) The eGFR has been calculated using the CKD EPI equation. This calculation has not been validated in all clinical situations. eGFR's persistently <60 mL/min signify possible Chronic Kidney Disease.    Anion gap 4 (L) 5 - 15  APTT     Status: Abnormal   Collection Time: 10/11/14  6:23 AM  Result Value Ref Range   aPTT 52 (H) 24 - 36 seconds    Comment:        IF BASELINE aPTT IS ELEVATED, SUGGEST PATIENT RISK ASSESSMENT BE USED TO DETERMINE APPROPRIATE ANTICOAGULANT THERAPY.   Renal function     Status: Abnormal   Collection Time: 10/11/14  6:24 AM  Result Value Ref Range   Sodium 136 135 - 145 mmol/L   Potassium 3.5 3.5 - 5.1 mmol/L   Chloride 102 101 - 111 mmol/L   CO2 27 22 - 32 mmol/L   Glucose, Bld 125 (H) 65 - 99 mg/dL   BUN 32 (H) 6 - 20 mg/dL   Creatinine, Ser SEE COMMENTS 0.44 - 1.00 mg/dL    Comment: UNABLE TO REPORT DUE TO ICTERUS   Calcium 8.9 8.9 - 10.3 mg/dL   Phosphorus SEE COMMENTS 2.5 - 4.6 mg/dL    Comment: UNABLE TO REPORT DUE TO ICTERUS   Albumin 2.3 (L) 3.5 - 5.0 g/dL   GFR calc non Af Amer NOT CALCULATED >60 mL/min   GFR calc Af Amer NOT CALCULATED >60 mL/min    Comment: (NOTE) The eGFR has been calculated using the CKD EPI equation. This calculation has not been validated in all clinical situations. eGFR's persistently <60 mL/min signify possible Chronic Kidney Disease.    Anion gap 7 5 - 15   Glucose, capillary     Status: Abnormal   Collection Time: 10/11/14  6:36 AM  Result Value Ref Range   Glucose-Capillary 104 (H) 65 - 99 mg/dL  Glucose, capillary     Status: None   Collection Time: 10/11/14  7:40 AM  Result Value Ref Range   Glucose-Capillary 76 65 - 99 mg/dL  Glucose, capillary     Status: Abnormal   Collection Time: 10/11/14  8:44 AM  Result Value Ref Range   Glucose-Capillary 114 (H) 65 - 99 mg/dL  Blood gas, arterial     Status: Abnormal   Collection Time: 10/11/14  9:30 AM  Result Value Ref Range   FIO2 35.00 %   Mode CONTINUOUS POSITIVE AIRWAY PRESSURE    Pressure support 0.00 cm H20    Comment: 0.00   pH, Arterial 7.40 7.350 - 7.450   pCO2 arterial 45 32.0 - 48.0 mmHg   pO2, Arterial 66 (L) 83.0 -  108.0 mmHg   Bicarbonate 27.9 21.0 - 28.0 mEq/L   Acid-Base Excess 2.5 0.0 - 3.0 mmol/L   O2 Saturation 92.7 %   Patient temperature 37.0    Collection site RIGHT RADIAL    Sample type ARTERIAL DRAW    Allens test (pass/fail) POSITIVE (A) PASS  Glucose, capillary     Status: Abnormal   Collection Time: 10/11/14  9:47 AM  Result Value Ref Range   Glucose-Capillary 151 (H) 65 - 99 mg/dL  Glucose, capillary     Status: Abnormal   Collection Time: 10/11/14 10:53 AM  Result Value Ref Range   Glucose-Capillary 166 (H) 65 - 99 mg/dL  Glucose, capillary     Status: Abnormal   Collection Time: 10/11/14 11:58 AM  Result Value Ref Range   Glucose-Capillary 177 (H) 65 - 99 mg/dL  Glucose, capillary     Status: Abnormal   Collection Time: 10/11/14  1:09 PM  Result Value Ref Range   Glucose-Capillary 209 (H) 65 - 99 mg/dL   Comment 1 Notify RN   Glucose, capillary     Status: Abnormal   Collection Time: 10/11/14  2:05 PM  Result Value Ref Range   Glucose-Capillary 209 (H) 65 - 99 mg/dL  Renal function     Status: Abnormal   Collection Time: 10/11/14  3:01 PM  Result Value Ref Range   Sodium 137 135 - 145 mmol/L   Potassium 3.7 3.5 - 5.1 mmol/L   Chloride  103 101 - 111 mmol/L   CO2 26 22 - 32 mmol/L   Glucose, Bld 216 (H) 65 - 99 mg/dL   BUN 31 (H) 6 - 20 mg/dL   Creatinine, Ser SEE COMMENTS 0.44 - 1.00 mg/dL    Comment: UNABLE TO REPORT DUE TO ICTERUS   Calcium 9.2 8.9 - 10.3 mg/dL   Phosphorus SEE COMMENTS 2.5 - 4.6 mg/dL    Comment: UNABLE TO REPORT DUE TO ICTERUS   Albumin 2.3 (L) 3.5 - 5.0 g/dL   GFR calc non Af Amer NOT CALCULATED >60 mL/min   GFR calc Af Amer NOT CALCULATED >60 mL/min    Comment: (NOTE) The eGFR has been calculated using the CKD EPI equation. This calculation has not been validated in all clinical situations. eGFR's persistently <60 mL/min signify possible Chronic Kidney Disease.    Anion gap 8 5 - 15  Glucose, capillary     Status: Abnormal   Collection Time: 10/11/14  3:16 PM  Result Value Ref Range   Glucose-Capillary 209 (H) 65 - 99 mg/dL  Glucose, capillary     Status: Abnormal   Collection Time: 10/11/14  4:11 PM  Result Value Ref Range   Glucose-Capillary 185 (H) 65 - 99 mg/dL  Glucose, capillary     Status: Abnormal   Collection Time: 10/11/14  5:15 PM  Result Value Ref Range   Glucose-Capillary 148 (H) 65 - 99 mg/dL  Glucose, capillary     Status: Abnormal   Collection Time: 10/11/14  6:21 PM  Result Value Ref Range   Glucose-Capillary 151 (H) 65 - 99 mg/dL  Glucose, capillary     Status: Abnormal   Collection Time: 10/11/14  7:18 PM  Result Value Ref Range   Glucose-Capillary 156 (H) 65 - 99 mg/dL  Glucose, capillary     Status: Abnormal   Collection Time: 10/11/14  8:22 PM  Result Value Ref Range   Glucose-Capillary 123 (H) 65 - 99 mg/dL  Glucose, capillary  Status: Abnormal   Collection Time: 10/11/14  9:20 PM  Result Value Ref Range   Glucose-Capillary 128 (H) 65 - 99 mg/dL  Glucose, capillary     Status: Abnormal   Collection Time: 10/11/14 10:14 PM  Result Value Ref Range   Glucose-Capillary 133 (H) 65 - 99 mg/dL  Glucose, capillary     Status: Abnormal   Collection Time:  10/11/14 11:18 PM  Result Value Ref Range   Glucose-Capillary 164 (H) 65 - 99 mg/dL  Renal function     Status: Abnormal   Collection Time: 10/11/14 11:27 PM  Result Value Ref Range   Sodium 137 135 - 145 mmol/L   Potassium 4.3 3.5 - 5.1 mmol/L   Chloride 104 101 - 111 mmol/L   CO2 28 22 - 32 mmol/L   Glucose, Bld 191 (H) 65 - 99 mg/dL   BUN 28 (H) 6 - 20 mg/dL   Creatinine, Ser NOT VALID 0.44 - 1.00 mg/dL    Comment: UNABLE TO REPORT DUE TO ICTERUS INTERFERENCE   Calcium 9.1 8.9 - 10.3 mg/dL   Phosphorus  2.5 - 4.6 mg/dL    Comment: NOT VALID UNABLE TO PERFORM DUE TO ICTERUS INTERFERENCE    Albumin 2.3 (L) 3.5 - 5.0 g/dL   GFR calc non Af Amer NOT CALCULATED >60 mL/min   GFR calc Af Amer NOT CALCULATED >60 mL/min   Anion gap 5 5 - 15  Glucose, capillary     Status: Abnormal   Collection Time: 10/12/14 12:09 AM  Result Value Ref Range   Glucose-Capillary 185 (H) 65 - 99 mg/dL  Glucose, capillary     Status: Abnormal   Collection Time: 10/12/14  1:16 AM  Result Value Ref Range   Glucose-Capillary 187 (H) 65 - 99 mg/dL  Glucose, capillary     Status: Abnormal   Collection Time: 10/12/14  2:10 AM  Result Value Ref Range   Glucose-Capillary 201 (H) 65 - 99 mg/dL  Glucose, capillary     Status: Abnormal   Collection Time: 10/12/14  3:16 AM  Result Value Ref Range   Glucose-Capillary 154 (H) 65 - 99 mg/dL  Glucose, capillary     Status: Abnormal   Collection Time: 10/12/14  4:08 AM  Result Value Ref Range   Glucose-Capillary 133 (H) 65 - 99 mg/dL  Magnesium     Status: None   Collection Time: 10/12/14  4:44 AM  Result Value Ref Range   Magnesium 1.7 1.7 - 2.4 mg/dL  APTT     Status: Abnormal   Collection Time: 10/12/14  4:44 AM  Result Value Ref Range   aPTT 50 (H) 24 - 36 seconds    Comment:        IF BASELINE aPTT IS ELEVATED, SUGGEST PATIENT RISK ASSESSMENT BE USED TO DETERMINE APPROPRIATE ANTICOAGULANT THERAPY.   CBC     Status: Abnormal   Collection Time:  10/12/14  4:44 AM  Result Value Ref Range   WBC 20.8 (H) 3.6 - 11.0 K/uL   RBC 3.05 (L) 3.80 - 5.20 MIL/uL   Hemoglobin 9.4 (L) 12.0 - 16.0 g/dL   HCT 27.9 (L) 35.0 - 47.0 %   MCV 91.4 80.0 - 100.0 fL   MCH 30.7 26.0 - 34.0 pg   MCHC 33.6 32.0 - 36.0 g/dL   RDW 17.2 (H) 11.5 - 14.5 %   Platelets 91 (L) 150 - 440 K/uL  Comprehensive metabolic panel     Status: Abnormal   Collection  Time: 10/12/14  4:44 AM  Result Value Ref Range   Sodium 138 135 - 145 mmol/L   Potassium 4.3 3.5 - 5.1 mmol/L   Chloride 102 101 - 111 mmol/L   CO2 28 22 - 32 mmol/L   Glucose, Bld 140 (H) 65 - 99 mg/dL   BUN 28 (H) 6 - 20 mg/dL   Creatinine, Ser SEE COMMENTS 0.44 - 1.00 mg/dL    Comment: UNABLE TO REPORT DUE TO ICTERUS   Calcium 9.6 8.9 - 10.3 mg/dL   Total Protein 6.8 6.5 - 8.1 g/dL   Albumin 2.3 (L) 3.5 - 5.0 g/dL   AST 208 (H) 15 - 41 U/L   ALT 62 (H) 14 - 54 U/L   Alkaline Phosphatase 1227 (H) 38 - 126 U/L   Total Bilirubin 35.9 (HH) 0.3 - 1.2 mg/dL    Comment: RESULTS CONFIRMED BY MANUAL DILUTION CRITICAL RESULT CALLED TO, READ BACK BY AND VERIFIED WITH: ERIN MAYNOR AT 0900 10/12/14 Rodney    GFR calc non Af Amer NOT CALCULATED >60 mL/min   GFR calc Af Amer NOT CALCULATED >60 mL/min    Comment: (NOTE) The eGFR has been calculated using the CKD EPI equation. This calculation has not been validated in all clinical situations. eGFR's persistently <60 mL/min signify possible Chronic Kidney Disease.    Anion gap 8 5 - 15  Protime-INR     Status: Abnormal   Collection Time: 10/12/14  4:45 AM  Result Value Ref Range   Prothrombin Time 23.6 (H) 11.4 - 15.0 seconds   INR 2.09   Glucose, capillary     Status: Abnormal   Collection Time: 10/12/14  5:11 AM  Result Value Ref Range   Glucose-Capillary 141 (H) 65 - 99 mg/dL  Glucose, capillary     Status: Abnormal   Collection Time: 10/12/14  6:05 AM  Result Value Ref Range   Glucose-Capillary 146 (H) 65 - 99 mg/dL  Glucose, capillary     Status:  Abnormal   Collection Time: 10/12/14  7:11 AM  Result Value Ref Range   Glucose-Capillary 149 (H) 65 - 99 mg/dL  Renal function     Status: Abnormal   Collection Time: 10/12/14  7:52 AM  Result Value Ref Range   Sodium 136 135 - 145 mmol/L   Potassium 4.3 3.5 - 5.1 mmol/L   Chloride 104 101 - 111 mmol/L   CO2 27 22 - 32 mmol/L   Glucose, Bld 167 (H) 65 - 99 mg/dL   BUN 28 (H) 6 - 20 mg/dL   Creatinine, Ser SEE COMMENTS 0.44 - 1.00 mg/dL    Comment: UNABLE TO REPORT DUE TO ICTERUS   Calcium 9.2 8.9 - 10.3 mg/dL   Phosphorus SEE COMMENTS 2.5 - 4.6 mg/dL    Comment: UNABLE TO REPORT DUE TO ICTERUS   Albumin 2.3 (L) 3.5 - 5.0 g/dL   GFR calc non Af Amer NOT CALCULATED >60 mL/min   GFR calc Af Amer NOT CALCULATED >60 mL/min    Comment: (NOTE) The eGFR has been calculated using the CKD EPI equation. This calculation has not been validated in all clinical situations. eGFR's persistently <60 mL/min signify possible Chronic Kidney Disease.    Anion gap 5 5 - 15  Glucose, capillary     Status: Abnormal   Collection Time: 10/12/14  8:10 AM  Result Value Ref Range   Glucose-Capillary 149 (H) 65 - 99 mg/dL  Glucose, capillary     Status: Abnormal  Collection Time: 10/12/14  9:12 AM  Result Value Ref Range   Glucose-Capillary 140 (H) 65 - 99 mg/dL  Glucose, capillary     Status: Abnormal   Collection Time: 10/12/14 10:18 AM  Result Value Ref Range   Glucose-Capillary 142 (H) 65 - 99 mg/dL  Glucose, capillary     Status: Abnormal   Collection Time: 10/12/14 11:14 AM  Result Value Ref Range   Glucose-Capillary 144 (H) 65 - 99 mg/dL  Glucose, capillary     Status: Abnormal   Collection Time: 10/12/14 12:13 PM  Result Value Ref Range   Glucose-Capillary 160 (H) 65 - 99 mg/dL  Glucose, capillary     Status: Abnormal   Collection Time: 10/12/14  1:14 PM  Result Value Ref Range   Glucose-Capillary 156 (H) 65 - 99 mg/dL  Glucose, capillary     Status: Abnormal   Collection Time:  10/12/14  2:12 PM  Result Value Ref Range   Glucose-Capillary 155 (H) 65 - 99 mg/dL  Glucose, capillary     Status: Abnormal   Collection Time: 10/12/14  3:16 PM  Result Value Ref Range   Glucose-Capillary 170 (H) 65 - 99 mg/dL  Renal function     Status: Abnormal   Collection Time: 10/12/14  3:41 PM  Result Value Ref Range   Sodium 135 135 - 145 mmol/L   Potassium 4.3 3.5 - 5.1 mmol/L   Chloride 101 101 - 111 mmol/L   CO2 27 22 - 32 mmol/L   Glucose, Bld 203 (H) 65 - 99 mg/dL   BUN 28 (H) 6 - 20 mg/dL   Creatinine, Ser UNABLE TO REPORT DUE TO ICTERUS 0.44 - 1.00 mg/dL   Calcium 9.3 8.9 - 10.3 mg/dL   Phosphorus UNABLE TO REPORT DUE TO INTERFERENCE FROM ICTERUS 2.5 - 4.6 mg/dL   Albumin 2.2 (L) 3.5 - 5.0 g/dL   GFR calc non Af Amer NOT CALCULATED >60 mL/min   GFR calc Af Amer NOT CALCULATED >60 mL/min    Comment: (NOTE) The eGFR has been calculated using the CKD EPI equation. This calculation has not been validated in all clinical situations. eGFR's persistently <60 mL/min signify possible Chronic Kidney Disease.    Anion gap 7 5 - 15  Glucose, capillary     Status: Abnormal   Collection Time: 10/12/14  4:11 PM  Result Value Ref Range   Glucose-Capillary 166 (H) 65 - 99 mg/dL  Glucose, capillary     Status: Abnormal   Collection Time: 10/12/14  5:11 PM  Result Value Ref Range   Glucose-Capillary 172 (H) 65 - 99 mg/dL  . No results found..   ASSESSMENT: Respiratory failure with multiorgan failure due to sepsis  PLAN: I can discuss with my partners the possibility of a trach next week. She will need her coagulation status improved. This is a potentially bloody procedure that involves an open wound, and hemostasis can become a problem. She is also at general higher risk for cardiovascular events given her condition, and this was all discussed with the family. 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 The earliest we could possibly get this done would be Monday, if Dr. Kathyrn Sheriff has time to add the case on to his OR schedule, or maybe Tuesday with Dr. Tami Ribas.   I will see what we can arrange, but in the  meantime it is important to see what kind of trend you can get with the coags, since she did n ot respond to FFP earlier.   Linnie, [place]gr[place] 10/12/2014 5:29 PM

## 2014-10-12 NOTE — Consult Note (Signed)
PULMONARY / CRITICAL CARE MEDICINE   Name: [name] MRN: [id number] DOB: [birth year]    ADMISSION DATE:  09/12/2014    CHIEF COMPLAINT:   resp failure   SIGNIFICANT EVENTS    Patient remains intubated,sedated. On PS modet, on vasopressors,  on CRRT, patient with liver,renal and resp failure, prognosis is very poor  not tolerating TF's  S/p EGD shows erosive and severe esophagitis   Will need to make decision regarding re-intubation and Trach and PEG tube Daughter in law at bedsid   [place]    :  Past Medical History  Diagnosis Date  . HTN (hypertension)   . Diabetes mellitus type II   . Obesity   . Arthritis   . Chronic kidney disease   . Amyloidosis    Past Surgical History  Procedure Laterality Date  . Total abdominal hysterectomy  10/1999    fibroids  . Combined abdominoplasty and liposuction  07/2000  . Total knee arthroplasty Right 09/28/2013    Procedure: RIGHT TOTAL KNEE ARTHROPLASTY;  Surgeon: Mauri Pole, MD;  Location: [facility];  Service: Orthopedics;  Laterality: Right;   Prior to Admission medications   Medication Sig Start Date End Date Taking? Authorizing Provider  metolazone (ZAROXOLYN) 5 MG tablet Take 5 mg by mouth daily.   Yes Historical Provider, MD  midodrine (PROAMATINE) 10 MG tablet Take 5 mg by mouth 3 (three) times daily.    Yes Historical Provider, MD  polyethylene glycol powder (GLYCOLAX/MIRALAX) powder Take 17 g by mouth daily as needed for moderate constipation. 09/20/14  Yes Abner Greenspan, MD  potassium chloride SA (K-DUR,KLOR-CON) 20 MEQ tablet Take 1 tablet (20 mEq total) by mouth daily. 07/25/14  Yes Abner Greenspan, MD  torsemide (DEMADEX) 20 MG tablet Take 20 mg by mouth 2 (two) times daily as needed (swelling).    Yes Historical Provider, MD   No Known Allergies   FAMILY HISTORY   Family History  Problem Relation Age of Onset  . Arthritis Mother   . Hypertension Mother   . Cancer Mother     breast,  lung  . Diabetes Mother   . Hypertension Father       SOCIAL HISTORY    reports that she has never smoked. She has never used smokeless tobacco. She reports that she does not drink alcohol or use illicit drugs.  Review of Systems  Unable to perform ROS: critical illness      VITAL SIGNS    Temp:  [97.5 F (36.4 C)-98.7 F (37.1 C)] 97.5 F (36.4 C) (06/08 0721) Pulse Rate:  [87-100] 97 (06/08 0721) Resp:  [14-29] 21 (06/08 0721) BP: (83-108)/(51-64) 106/58 mmHg (06/08 0700) SpO2:  [92 %-100 %] 97 % (06/08 0813) FiO2 (%):  [35 %] 35 % (06/08 0813) Weight:  [168 lb 6.9 oz (76.4 kg)] 168 lb 6.9 oz (76.4 kg) (06/08 0243) HEMODYNAMICS:   VENTILATOR SETTINGS: Vent Mode:  [-] Spontaneous FiO2 (%):  [35 %] 35 % PEEP:  [5 cmH20] 5 cmH20 Pressure Support:  [8 cmH20-10 cmH20] 8 cmH20 INTAKE / OUTPUT:  Intake/Output Summary (Last 24 hours) at 10/12/14 0826 Last data filed at 10/12/14 6222  Gross per 24 hour  Intake 2420.22 ml  Output   4949 ml  Net -2528.78 ml       PHYSICAL EXAM   Physical Exam  Constitutional: She appears distressed.  HENT:  Head: Normocephalic and atraumatic.  Eyes: Pupils are equal, round, and reactive to light.  No scleral icterus.  Neck: Normal range of motion. Neck supple.  Cardiovascular: Normal rate and regular rhythm.   No murmur heard. Pulmonary/Chest: No respiratory distress. She has no wheezes. She has rales.  resp distress  Abdominal: Soft. She exhibits no distension. There is no tenderness.  Musculoskeletal: She exhibits no edema.  Neurological: She displays normal reflexes. Coordination normal.  gcs<8T  Skin: Skin is warm. No rash noted. She is diaphoretic.       LABS   LABS:  CBC  Recent Labs Lab 10/10/14 0533 10/11/14 0555 10/12/14 0444  WBC 17.6* 17.3* 20.8*  HGB 8.9* 9.5* 9.4*  HCT 26.5* 27.7* 27.9*  PLT 73* 86* 91*   Coag's  Recent Labs Lab 10/14/2014 0538  10/07/14 1120  10/10/14 0533 10/11/14 0623  10/12/14 0444 10/12/14 0445  APTT 42*  < > 41*  < > 54* 52* 50*  --   INR 2.25  --  2.03  --   --   --   --  2.09  < > = values in this interval not displayed. BMET  Recent Labs Lab 10/11/14 0624 10/11/14 1501 10/11/14 2327  NA 136 137 137  K 3.5 3.7 4.3  CL 102 103 104  CO2 27 26 28   BUN 32* 31* 28*  CREATININE SEE COMMENTS SEE COMMENTS NOT VALID  GLUCOSE 125* 216* 191*   Electrolytes  Recent Labs Lab 10/09/14 0428  10/10/14 0533  10/10/14 2251 10/11/14 0555 10/11/14 0624 10/11/14 1501 10/11/14 2327  CALCIUM 9.1  < > 8.7*  9.0  < > 9.3 9.1 8.9 9.2 9.1  MG 2.1  --  1.8  --   --  1.7  --   --   --   PHOS  --   < > SEE COMMENTS  UNABLE TO REPORT DUE TO ICTERUS  < > SEE COMMENTS  --  SEE COMMENTS SEE COMMENTS  --   < > = values in this interval not displayed. Sepsis Markers No results for input(s): LATICACIDVEN, PROCALCITON, O2SATVEN in the last 168 hours. ABG  Recent Labs Lab 10/07/14 1125 10/10/14 1155 10/11/14 0930  PHART 7.34* 7.34* 7.40  PCO2ART 51* 47 45  PO2ART 68* 76* 66*   Liver Enzymes  Recent Labs Lab 10/08/14 1409  10/10/14 0533  10/11/14 0555 10/11/14 0624 10/11/14 1501 10/11/14 2327  AST 85*  --  117*  --  147*  --   --   --   ALT 33  --  40  --  50  --   --   --   ALKPHOS 702*  --  874*  --  973*  --   --   --   BILITOT 31.4*  --  37.4*  --  34.8*  --   --   --   ALBUMIN 3.0*  2.9*  < > 2.7*  2.7*  < > 2.5* 2.3* 2.3* 2.3*  < > = values in this interval not displayed. Cardiac Enzymes No results for input(s): TROPONINI, PROBNP in the last 168 hours. Glucose  Recent Labs Lab 10/12/14 0316 10/12/14 0408 10/12/14 0511 10/12/14 0605 10/12/14 0711 10/12/14 0810  GLUCAP 154* 133* 141* 146* 149* 149*     Recent Results (from the past 240 hour(s))  Culture, blood (routine x 2)     Status: None   Collection Time: 10/04/14 11:50 AM  Result Value Ref Range Status   Specimen Description BLOOD  Final   Special Requests NONE  Final  Culture NO GROWTH 5 DAYS  Final   Report Status 10/09/2014 FINAL  Final  Culture, blood (routine x 2)     Status: None   Collection Time: 10/04/14 12:00 PM  Result Value Ref Range Status   Specimen Description BLOOD  Final   Special Requests NONE  Final   Culture NO GROWTH 5 DAYS  Final   Report Status 10/09/2014 FINAL  Final  Culture, respiratory (NON-Expectorated)     Status: None   Collection Time: 10/04/14  2:00 PM  Result Value Ref Range Status   Specimen Description INDUCED SPUTUM  Final   Special Requests Normal  Final   Gram Stain   Final    EXCELLENT SPECIMEN - 90-100% WBCS MANY WBC SEEN MANY YEAST    Culture MODERATE GROWTH CANDIDA ALBICANS  Final   Report Status 10/07/2014 FINAL  Final     Current facility-administered medications:  .  Marland KitchenTPN (CLINIMIX-E) Adult, , Intravenous, Continuous TPN, Flora Lipps, MD, Last Rate: 70 mL/hr at 10/12/14 0721 .  acetaminophen (TYLENOL) tablet 650 mg, 650 mg, Oral, Q6H PRN **OR** acetaminophen (TYLENOL) suppository 650 mg, 650 mg, Rectal, Q6H PRN, Lytle Butte, MD, 650 mg at 10/03/14 1030 .  albuterol (PROVENTIL) (2.5 MG/3ML) 0.083% nebulizer solution 2.5 mg, 2.5 mg, Nebulization, Q6H PRN, Juluis Mire, MD, 2.5 mg at 10/03/14 0816 .  antiseptic oral rinse (CPC / CETYLPYRIDINIUM CHLORIDE 0.05%) solution 7 mL, 7 mL, Mouth Rinse, QID, Erby Pian, MD, 7 mL at 10/12/14 0355 .  budesonide (PULMICORT) nebulizer solution 0.5 mg, 0.5 mg, Nebulization, BID, Flora Lipps, MD, 0.5 mg at 10/12/14 0812 .  chlorhexidine (PERIDEX) 0.12 % solution 15 mL, 15 mL, Mouth Rinse, BID, Herbon London Pepper, MD, 15 mL at 10/12/14 0800 .  fentaNYL 2568mcg in NS 232mL (104mcg/ml) infusion-PREMIX, 10 mcg/hr, Intravenous, Continuous, Hillary Bow, MD, Stopped at 10/12/14 0721 .  heparin injection 1,000-6,000 Units, 1,000-6,000 Units, CRRT, PRN, Murlean Iba, MD, 1,000 Units at 10/01/14 1435 .  insulin regular (NOVOLIN R,HUMULIN R) 250 Units in sodium  chloride 0.9 % 250 mL (1 Units/mL) infusion, , Intravenous, Continuous, Flora Lipps, MD, Last Rate: 2.7 mL/hr at 10/12/14 0721, 2.7 Units/hr at 10/12/14 0721 .  ipratropium-albuterol (DUONEB) 0.5-2.5 (3) MG/3ML nebulizer solution 3 mL, 3 mL, Nebulization, Q4H, Flora Lipps, MD, 3 mL at 10/12/14 0812 .  lactulose (CHRONULAC) 10 GM/15ML solution 20 g, 20 g, Oral, BID, Josefine Class, MD, 20 g at 10/12/14 0757 .  levofloxacin (LEVAQUIN) IVPB 500 mg, 500 mg, Intravenous, Q24H, Adrian Prows, MD, 500 mg at 10/11/14 1709 .  midodrine (PROAMATINE) tablet 10 mg, 10 mg, Oral, TID, Lytle Butte, MD, 10 mg at 10/12/14 0757 .  norepinephrine (LEVOPHED) 16 mg in dextrose 5 % 250 mL (0.064 mg/mL) infusion, 0-40 mcg/min, Intravenous, Titrated, Srikar Sudini, MD, Last Rate: 15.9 mL/hr at 10/12/14 0721, 17 mcg/min at 10/12/14 0721 .  pantoprazole (PROTONIX) injection 40 mg, 40 mg, Intravenous, Q12H, Flora Lipps, MD, 40 mg at 10/12/14 0752 .  polyethylene glycol powder (GLYCOLAX/MIRALAX) container 17 g, 17 g, Oral, Daily PRN, Lytle Butte, MD .  pureflow IV solution for Dialysis, , CRRT, Continuous, Lavonia Dana, MD, Last Rate: 2,000 mL/hr at 10/12/14 0819 .  sodium chloride 0.9 % injection 10-40 mL, 10-40 mL, Intracatheter, Q12H, Srikar Sudini, MD, 10 mL at 10/12/14 2671 .  sodium chloride 0.9 % injection 10-40 mL, 10-40 mL, Intracatheter, PRN, Srikar Sudini, MD .  sucralfate (CARAFATE) 1 GM/10ML suspension 1 g, 1  g, Oral, 4 times per day, Josefine Class, MD, 1 g at 10/12/14 0556  IMAGING    No results found.    Indwelling Urinary Catheter continued, requirement due to   Reason to continue Indwelling Urinary Catheter for strict Intake/Output monitoring for hemodynamic instability   Central Line continued, requirement due to   Reason to continue [employer] Monitoring of central venous pressure or other hemodynamic parameters   Ventilator continued, requirement due to, resp failure     Ventilator Sedation RASS 0 to -2      ASSESSMENT/PLAN   64 yo AAF admitted to ICU for acute septic shock and progressive resp failure with renal failure Patient with underlying Amyloidosis with progressive multiorgan failure   PULMONARY-Respiratory Failure-failed SAT/SBT due to resp muscle fatigue and encephlopathy -continue PS mode as tolerated -continue Bronchodilator Therapy -Wean Fio2 and PEEP as tolerated -waiting for family to decide on plan of care regarding re-intubation/trach/peg  CARDIOVASCULAR -septic shock-use vasopressors to keep MAP>65 -ABX as prescribed -stress dose steroids completed  RENAL-no urine output-at this time, patient not likely to recover kidney function -progressive renal failure from ATN -on CRRT -follow up Nephrology recs  GASTROINTESTINAL-GIB-severe erosive esophagitis, showing signs of intolerance to Tf's -bowel rest for now, continue TPN -liver failure-worsening -follow up Gi recs   HEMATOLOGIC -follow H/H s/p transfusion -follow INR s/p FFP  INFECTIOUS- -streptococcus bacteremia from cellulitis -on abx -repeat blood cultures no growth -follow up ID recs   ENDOCRINE -follow FSBS -on insulin drip  NEUROLOGIC -sedation as needed  NUTRITION-on TPN  I have personally obtained a history, examined the patient, evaluated laboratory and imaging results, formulated the assessment and plan and placed orders.  The Patient requires high complexity decision making for assessment and support, frequent evaluation and titration of therapies, application of advanced monitoring technologies and extensive interpretation of multiple databases. Critical Care Time devoted to patient care services described in this note is 45 minutes.   Overall, patient is critically ill, prognosis is very poor/guarded. Patient at high risk for cardiac arrest and death.   Palliative care team consulted-recommend DNR status, consider comfort care measures  I would  like to attempt extubation however, need plan of action from family regarding trach/PEG and re-intubation  Fabiano Ginley Patricia Pesa, M.D. Pulmonary & [place] Director Intensive Care Unit   10/12/2014, 8:26 AM

## 2014-10-12 NOTE — Progress Notes (Signed)
Nutrition Follow-up      INTERVENTION:   (TPN): recommend continuing TPN at current goal rate; recommend addition of 20% Lipids weekly EN: discussed nutritional poc during CCU rounds; discussed possibility of trial of trophic feedings. Per MD Kasa, plan to clamp OG tube today and assess tolerance. Continue to assess for trial of EN.   NUTRITION DIAGNOSIS:  Inadequate oral intake related to inability to eat as evidenced by NPO status. Being addressed via TPN  GOAL:   (PN): continue tolerance at goal rate EN: initiation of EN as soon as clinically feasible   MONITOR:   (PN, EN, Electrolyte and renal profile, Anthropometric, Digestive system )  ASSESSMENT:  Pt remains on vent, on CRRT.   Diet Order: NPO PN: 5%AA/15%Dextrose at rate of 70 ml/hr  Electrolyte and Renal Profile:  Recent Labs Lab 10/10/14 0533  10/11/14 0555 10/11/14 0624 10/11/14 1501 10/11/14 2327 10/12/14 0444 10/12/14 0752  BUN 53*  53*  < > 32* 32* 31* 28* 28* 28*  CREATININE UNABLE TO REPORT DUE TO ICTERUS  SEE COMMENTS  < > SEE COMMENTS SEE COMMENTS SEE COMMENTS NOT VALID SEE COMMENTS SEE COMMENTS  NA 132*  134*  < > 137 136 137 137 138 136  K 4.5  4.5  < > 3.5 3.5 3.7 4.3 4.3 4.3  MG 1.8  --  1.7  --   --   --  1.7  --   PHOS SEE COMMENTS  UNABLE TO REPORT DUE TO ICTERUS  < >  --  SEE COMMENTS SEE COMMENTS  --   --  SEE COMMENTS  < > = values in this interval not displayed.  Glucose Profile:  Recent Labs  10/12/14 0912 10/12/14 1018 10/12/14 1114  GLUCAP 140* 142* 144*   Hepatic Function Panel     Component Value Date/Time   PROT 6.8 10/12/2014 0444   PROT 5.0* 08/22/2014 1712   ALBUMIN 2.3* 10/12/2014 0752   ALBUMIN 1.7* 08/22/2014 1712   AST 208* 10/12/2014 0444   AST 40 08/22/2014 1712   ALT 62* 10/12/2014 0444   ALT 22 08/22/2014 1712   ALKPHOS 1227* 10/12/2014 0444   ALKPHOS 294* 08/22/2014 1712   BILITOT 35.9* 10/12/2014 0444   BILIDIR 22.6* 10/08/2014 0658   IBILI  8.8* 10/08/2014 0658    Nutritional Anemia Profile:  CBC Latest Ref Rng 10/12/2014 10/11/2014 10/10/2014  WBC 3.6 - 11.0 K/uL 20.8(H) 17.3(H) 17.6(H)  Hemoglobin 12.0 - 16.0 g/dL 9.4(L) 9.5(L) 8.9(L)  Hematocrit 35.0 - 47.0 % 27.9(L) 27.7(L) 26.5(L)  Platelets 150 - 440 K/uL 91(L) 86(L) 73(L)   Meds: levophed (17 mcg/min), adding vasopressin, insulin drip, lactulose, solumedrol, midodrine  Digestive System: abdomen distended, NG with 200 mL in 24 hours, +BM 6/5  Height:  Ht Readings from Last 1 Encounters:  10/03/2014 5\' 1"  (1.549 m)    Weight:  Wt Readings from Last 1 Encounters:  10/12/14 168 lb 6.9 oz (76.4 kg)    Filed Weights   10/10/14 0405 10/11/14 0500 10/12/14 0243  Weight: 187 lb 2.7 oz (84.9 kg) 184 lb 15.5 oz (83.9 kg) 168 lb 6.9 oz (76.4 kg)     BMI:  Body mass index is 31.84 kg/(m^2).  Estimated Nutritional Needs:  Kcal:  (11-14 kcals/d) Using actual wt of 95kg) 1050-1330 kcals/d  Protein:  (2.0-2.5 g/d) Using IbW of 48kg 96-120 g/d  Fluid:  (25-43ml/kg) Using IBW of 48kg 1200-1426ml/d     Intake/Output Summary (Last 24 hours) at 10/12/14 1114 Last data  filed at 10/12/14 0721  Gross per 24 hour  Intake 2099.02 ml  Output   4220 ml  Net -2120.98 ml    HIGH Care Level  Kerman Passey MS, RD, LDN [phone] Pager

## 2014-10-12 NOTE — Progress Notes (Signed)
Subjective:  Remains critically ill. Still on CRRT. BFR 250 DFR 2.5 UF 200 cc/hr 4 K bath NG placed to suction:  UOP anuric TPN 70 cc/hr Norepinephrine 17 On Pressure Support FiO2 35%  Objective:  Vital signs in last 24 hours:  Temp:  [97.5 F (36.4 C)-98.7 F (37.1 C)] 97.5 F (36.4 C) (06/08 0721) Pulse Rate:  [87-100] 97 (06/08 0721) Resp:  [14-29] 21 (06/08 0721) BP: (83-108)/(51-64) 106/58 mmHg (06/08 0700) SpO2:  [92 %-100 %] 98 % (06/08 0721) FiO2 (%):  [35 %] 35 % (06/08 0700) Weight:  [76.4 kg (168 lb 6.9 oz)] 76.4 kg (168 lb 6.9 oz) (06/08 0243)  Weight change: -7.5 kg (-16 lb 8.6 oz) Filed Weights   10/10/14 0405 10/11/14 0500 10/12/14 0243  Weight: 84.9 kg (187 lb 2.7 oz) 83.9 kg (184 lb 15.5 oz) 76.4 kg (168 lb 6.9 oz)    Intake/Output: I/O last 3 completed shifts: In: 4606.4 [I.V.:926.4; Other:30; NG/GT:260; IV Piggyback:100] Out: 8296 [Urine:44; Emesis/NG output:1075; IOXBD:5329; Stool:1]     Physical Exam: General: Critically ill appearing  HEENT Scleral icterus noted, ETT in place, NGT to suction  Neck supple  Pulm/lungs Decreased breath sounds at bases, vent assisted: PS FiO2 35%  CVS/Heart S1S2 no rubs  Abdomen:  distended, tense, decreased Bowel sounds  Extremities: 3+ anasarca  Neurologic: On the vent  Skin: Cools extremities  Access: Temp cath- rt femoral 5/25 Dr. Delana Meyer       Basic Metabolic Panel:  Recent Labs Lab 10/07/14 9242  10/08/14 6834  10/09/14 1962  10/10/14 0533 10/10/14 1420 10/10/14 2251 10/11/14 2297 10/11/14 9892 10/11/14 1501 10/11/14 2327  NA  --   < > 136  < > 136  < > 132*  134* 135 136 137 136 137 137  K  --   < > 3.8  < > 4.6  < > 4.5  4.5 4.2 3.7 3.5 3.5 3.7 4.3  CL  --   < > 105  < > 104  < > 102  103 103 102 104 102 103 104  CO2  --   < > 26  < > 26  < > 24  24 25 26 29 27 26 28   GLUCOSE  --   < > 146*  < > 178*  < > 224*  198* 120* 169* 147* 125* 216* 191*  BUN  --   < > 39*  < > 47*  < > 53*   53* 46* 38* 32* 32* 31* 28*  CREATININE  --   < > SEE COMMENTS  < > SEE COMMENTS  < > UNABLE TO REPORT DUE TO ICTERUS  SEE COMMENTS SEE COMMENTS SEE COMMENTS SEE COMMENTS SEE COMMENTS SEE COMMENTS NOT VALID  CALCIUM  --   < > 9.0  < > 9.1  < > 8.7*  9.0 9.1 9.3 9.1 8.9 9.2 9.1  MG 1.9  --  2.0  --  2.1  --  1.8  --   --  1.7  --   --   --   PHOS  --   < > SEE COMMENT  < >  --   < > SEE COMMENTS  UNABLE TO REPORT DUE TO ICTERUS SEE COMMENTS SEE COMMENTS  --  SEE COMMENTS SEE COMMENTS  --   < > = values in this interval not displayed.   CBC:  Recent Labs Lab 10/08/14 0437 10/09/14 0428 10/10/14 0533 10/11/14 0555 10/12/14 0444  WBC 26.5*  25.8* 17.6* 17.3* 20.8*  HGB 9.0* 9.1* 8.9* 9.5* 9.4*  HCT 26.5* 27.4* 26.5* 27.7* 27.9*  MCV 90.2 91.9 91.4 91.3 91.4  PLT 75* 72* 73* 86* 91*      Microbiology: Results for orders placed or performed during the hospital encounter of 09/04/2014  Blood culture (routine x 2)     Status: None   Collection Time: 10/03/2014 12:45 AM  Result Value Ref Range Status   Specimen Description BLOOD  Final   Special Requests NONE  Final   Culture  Setup Time   Final    GRAM POSITIVE COCCI IN CHAINS IN BOTH AEROBIC AND ANAEROBIC BOTTLES CRITICAL RESULT CALLED TO, READ BACK BY AND VERIFIED WITH: PAM CRAWFORD AT 1406 09/13/2014 DV    Culture STREPTOCOCCUS SPECIES  Final   Report Status 10/01/2014 FINAL  Final   Organism ID, Bacteria STREPTOCOCCUS SPECIES  Final      Susceptibility   Streptococcus species - MIC (ETEST)*    ERYTHROMYCIN Value in next row Resistant      RESISTANT6    PENICILLIN Value in next row Intermediate      INTERMEDIATE0.25    CEFTRIAXONE Value in next row Sensitive      SENSITIVE0.047    VANCOMYCIN Value in next row Sensitive      SENSITIVE1.0    LEVOFLOXACIN Value in next row Sensitive      SENSITIVE1.0    * STREPTOCOCCUS SPECIES  Blood culture (routine x 2)     Status: None   Collection Time: 09/07/2014 12:45 AM  Result Value  Ref Range Status   Specimen Description BLOOD  Final   Special Requests NONE  Final   Culture  Setup Time   Final    GRAM POSITIVE COCCI IN CHAINS IN BOTH AEROBIC AND ANAEROBIC BOTTLES CRITICAL RESULT CALLED TO, READ BACK BY AND VERIFIED WITH: PAM CRAWFORD AT 1406 09/19/2014 DV    Culture   Final    STREPTOCOCCUS SPECIES REFER TO ACCESSION [id number] COLLECTED ON 09/20/2014 AT 0045 FOR SENSITIVITIES    Report Status 09/30/2014 FINAL  Final  Culture, blood (routine x 2)     Status: None   Collection Time: 10/04/14 11:50 AM  Result Value Ref Range Status   Specimen Description BLOOD  Final   Special Requests NONE  Final   Culture NO GROWTH 5 DAYS  Final   Report Status 10/09/2014 FINAL  Final  Culture, blood (routine x 2)     Status: None   Collection Time: 10/04/14 12:00 PM  Result Value Ref Range Status   Specimen Description BLOOD  Final   Special Requests NONE  Final   Culture NO GROWTH 5 DAYS  Final   Report Status 10/09/2014 FINAL  Final  Culture, respiratory (NON-Expectorated)     Status: None   Collection Time: 10/04/14  2:00 PM  Result Value Ref Range Status   Specimen Description INDUCED SPUTUM  Final   Special Requests Normal  Final   Gram Stain   Final    EXCELLENT SPECIMEN - 90-100% WBCS MANY WBC SEEN MANY YEAST    Culture MODERATE GROWTH CANDIDA ALBICANS  Final   Report Status 10/07/2014 FINAL  Final    Coagulation Studies:  Recent Labs  10/12/14 0445  LABPROT 23.6*  INR 2.09    Urinalysis: No results for input(s): COLORURINE, LABSPEC, PHURINE, GLUCOSEU, HGBUR, BILIRUBINUR, KETONESUR, PROTEINUR, UROBILINOGEN, NITRITE, LEUKOCYTESUR in the last 72 hours.  Invalid input(s): APPERANCEUR    Imaging: No results found.  Medications:   . Marland KitchenTPN (CLINIMIX-E) Adult 70 mL/hr at 10/12/14 0721  . fentaNYL infusion INTRAVENOUS Stopped (10/12/14 0721)  . insulin (NOVOLIN-R) infusion 2.7 Units/hr (10/12/14 0721)  . norepinephrine (LEVOPHED) Adult infusion 17 mcg/min  (10/12/14 0721)  . pureflow 2,000 mL/hr at 10/11/14 0915   . antiseptic oral rinse  7 mL Mouth Rinse QID  . budesonide (PULMICORT) nebulizer solution  0.5 mg Nebulization BID  . chlorhexidine  15 mL Mouth Rinse BID  . ipratropium-albuterol  3 mL Nebulization Q4H  . lactulose  20 g Oral BID  . levofloxacin (LEVAQUIN) IV  500 mg Intravenous Q24H  . methylPREDNISolone (SOLU-MEDROL) injection  20 mg Intravenous Q6H  . midodrine  10 mg Oral TID  . pantoprazole (PROTONIX) IV  40 mg Intravenous Q12H  . sodium chloride  10-40 mL Intracatheter Q12H  . sucralfate  1 g Oral 4 times per day   acetaminophen **OR** acetaminophen, albuterol, heparin, polyethylene glycol powder, sodium chloride  Assessment/ Plan:   64 y.o. African [place] female with diabetes mellitus type 2, hypertension, morbid obesity, osteoarthritis, status post right knee surgery, Amyloidosis with nephrotic syndrome with edema, hypoalbuminemia, renal insufficiency and proteinuria.  1. ARF with CKD st 3 . Baseline cr 1.3. (08/2014)  2. Nephrotic Syndrome with proteinuria: Renal Amyloidosis.  3. Generalized Edema 4. Hypoalbuminemia 5. Right groin cellulitis /sepsis- strep species  6. Diabetes Mellitus type II 7. Hypotension 8. Acute Respiratory Failure with mechanical ventilation  Plan: Patient remains essentially anuric at this point in time. She has tolerated CRRT well. Continue vasopressors. Hemodynamically unstable.  Overall prognosis quite poor.  LOS: [place], [place] 6/8/20167:34 AM

## 2014-10-12 NOTE — Consult Note (Signed)
Palliative Medicine Inpatient Consult Follow Up Note   Name: [name] Date: 10/12/2014 MRN: [id number]  DOB: 29-Apr-1951  Referring Physician: Hillary Bow, MD  Palliative Care consult requested for this 64 y.o. female for goals of medical therapy in patient with amyloidosis (markers - for multiple myeloma), CKD stage III, T2DM, HTN admitted with sepsis.   Pt resting in bed on ventilator.  Pt receiving TPN.  Family at bedside.    REVIEW OF SYSTEMS:  Patient is not able to provide ROS  CODE STATUS: DNR   PAST MEDICAL HISTORY: Past Medical History  Diagnosis Date  . HTN (hypertension)   . Diabetes mellitus type II   . Obesity   . Arthritis   . Chronic kidney disease   . Amyloidosis     PAST SURGICAL HISTORY:  Past Surgical History  Procedure Laterality Date  . Total abdominal hysterectomy  10/1999    fibroids  . Combined abdominoplasty and liposuction  07/2000  . Total knee arthroplasty Right 09/28/2013    Procedure: RIGHT TOTAL KNEE ARTHROPLASTY;  Surgeon: Mauri Pole, MD;  Location: [facility];  Service: Orthopedics;  Laterality: Right;    Vital Signs: BP 83/54 mmHg  Pulse 94  Temp(Src) 97.5 F (36.4 C) (Axillary)  Resp 15  Ht _0  (1.549 m)  Wt 76.4 kg (168 lb 6.9 oz)  BMI 31.84 kg/m2  SpO2 98% Filed Weights   10/10/14 0405 10/11/14 0500 10/12/14 0243  Weight: 84.9 kg (187 lb 2.7 oz) 83.9 kg (184 lb 15.5 oz) 76.4 kg (168 lb 6.9 oz)    Estimated body mass index is 31.84 kg/(m^2) as calculated from the following:   Height as of this encounter: _1  (1.549 m).   Weight as of this encounter: 76.4 kg (168 lb 6.9 oz).  PHYSICAL EXAM: General: Critically ill appearing HEENT: ETT in place, scleral icterus Neck: Trachea midline  Cardiovascular: regular rate and rhythm Pulmonary/Chest: clear ant fields Abdominal: Soft, nontender, hypoactive bowel sounds GU: Foley present, anuric Extremities: 3 + pitting edema BLE's and BUE's Neurological: awake, ?  Follows simple commands, furrows brow, moves extremities Skin: R flank wound with gauze, CDI Psychiatric: awake, calm   LABS: CBC:    Component Value Date/Time   WBC 20.8* 10/12/2014 0444   WBC 9.2 08/22/2014 1712   HGB 9.4* 10/12/2014 0444   HGB 12.3 08/22/2014 1712   HCT 27.9* 10/12/2014 0444   HCT 38.3 08/22/2014 1712   PLT 91* 10/12/2014 0444   PLT 503* 08/22/2014 1712   MCV 91.4 10/12/2014 0444   MCV 87 08/22/2014 1712   NEUTROABS 21.1* 10/02/2014 1030   NEUTROABS 4.0 08/22/2014 1712   LYMPHSABS 1.5 10/02/2014 1030   LYMPHSABS 4.2* 08/22/2014 1712   MONOABS 2.0* 10/02/2014 1030   MONOABS 0.9 08/22/2014 1712   EOSABS 0.0 10/02/2014 1030   EOSABS 0.0 08/22/2014 1712   BASOSABS 0.0 10/02/2014 1030   BASOSABS 0.1 08/22/2014 1712   Comprehensive Metabolic Panel:    Component Value Date/Time   NA 136 10/12/2014 0752   NA 136 08/22/2014 1712   K 4.3 10/12/2014 0752   K 2.6* 08/22/2014 1712   CL 104 10/12/2014 0752   CL 92* 08/22/2014 1712   CO2 27 10/12/2014 0752   CO2 35* 08/22/2014 1712   BUN 28* 10/12/2014 0752   BUN 29* 08/22/2014 1712   CREATININE SEE COMMENTS 10/12/2014 0752   CREATININE 1.78* 08/22/2014 1712   GLUCOSE 167* 10/12/2014 0752   GLUCOSE 167* 08/22/2014 1712  CALCIUM 9.2 10/12/2014 0752   CALCIUM 8.2* 08/22/2014 1712   AST 208* 10/12/2014 0444   AST 40 08/22/2014 1712   ALT 62* 10/12/2014 0444   ALT 22 08/22/2014 1712   ALKPHOS 1227* 10/12/2014 0444   ALKPHOS 294* 08/22/2014 1712   BILITOT 35.9* 10/12/2014 0444   PROT 6.8 10/12/2014 0444   PROT 5.0* 08/22/2014 1712   ALBUMIN 2.3* 10/12/2014 0752   ALBUMIN 1.7* 08/22/2014 1712    IMPRESSION: [name] is a 64 y.o. female with amyloidosis (markers - for multiple myeloma), CKD stage III, T2DM, HTN, h/o uterine fibroids, and s/p total abdominal hysterectomy. Pt presented to ER from home with AMS.Workup significant for lactic acidosis, severe hypoalbuminemia, and ARF requiring CRRT.  Hospital course complicated by VDRF.   Pt currently on CRRT, spontaneous vent mode at 35% FiO2, 5 PEEP and 10 PS.  Pt is currently on 1 pressor and TPN.  Pt labs continue to show liver failure.  Pt with increased WOB with on spontaneous mode and switched to full vent support while I was in room.  Spoke with daughter in law and she states pt has clearly indicated to her that she would want a Trach/PEG if indicated.  Daughter in law states son wants the same.  PLAN: 1. See above     More than 50% of the visit was spent in counseling/coordination of care: YES  Time Spent:45 minutes

## 2014-10-12 NOTE — Progress Notes (Signed)
Pt remains on PS . On fentanyl drip @100mcg . Follows commands . remains on  17 mcg of levophed at present. crrt tolerated well. 46ml urine this shift. OG output 214ml

## 2014-10-12 NOTE — Progress Notes (Signed)
[place] [facility] [place] NAME: Gwendlyon Zumbro    MR#:  [id number]  DATE OF BIRTH:  11/11/1950  SUBJECTIVE:  CHIEF COMPLAINT:   Chief Complaint  Patient presents with  . Altered Mental Status    Critically ill. On pressors. CRRT. Was on PS but back on full vent support.  REVIEW OF SYSTEMS:    ROS  Unable to give history due to  patient intubated   DRUG ALLERGIES:  No Known Allergies  VITALS:  Blood pressure 83/54, pulse 94, temperature 97.5 F (36.4 C), temperature source Axillary, resp. rate 15, height 5\' 1"  (1.549 m), weight 76.4 kg (168 lb 6.9 oz), SpO2 98 %.  PHYSICAL EXAMINATION:   Physical Exam  GENERAL:  64 y.o.-year-old patient lying in the bed critically ill EYES: Pupils equal, round, reactive to light and accommodation. + scleral icterus.  HEENT: Head atraumatic, normocephalic.Intubated NECK:  Supple, no jugular venous distention. No thyroid enlargement, no tenderness. Right IJ . Right femoral Dialysis catheter. LUNGS: coarse breath sounds, poor air movement, vent CARDIOVASCULAR: S1, S2 normal. No murmurs,  + rub, - gallops. ABDOMEN: Soft, nontender, nondistended. Bowel sounds decreased. No organomegaly or mass. EXTREMITIES: No cyanosis, clubbing. Anasarca.  NEUROLOGIC:  Sedated PSYCHIATRIC: The patient is drowzy. SKIN:  Superficial ulcer right thigh.  LABORATORY PANEL:   CBC  Recent Labs Lab 10/12/14 0444  WBC 20.8*  HGB 9.4*  HCT 27.9*  PLT 91*   ------------------------------------------------------------------------------------------------------------------  Chemistries   Recent Labs Lab 10/12/14 0444 10/12/14 0752  NA 138 136  K 4.3 4.3  CL 102 104  CO2 28 27  GLUCOSE 140* 167*  BUN 28* 28*  CREATININE SEE COMMENTS SEE COMMENTS  CALCIUM 9.6 9.2  MG 1.7  --   AST 208*  --   ALT 62*  --   ALKPHOS 1227*  --   BILITOT 35.9*  --     ------------------------------------------------------------------------------------------------------------------  Cardiac Enzymes No results for input(s): TROPONINI in the last 168 hours. ------------------------------------------------------------------------------------------------------------------  RADIOLOGY:  Dg Chest Port 1 View  10/07/2014   IMPRESSION: 1. Worsened bilateral airspace opacities. In the context of the patient's moderate enlargement of the cardiopericardial silhouette, the appearance is suspicious for acute pulmonary edema. 2. The endotracheal tube has been successfully retracted, and the tubes and lines appear satisfactorily positioned.   Electronically Signed   By: Van Clines M.D.   On: 10/07/2014 08:42     ASSESSMENT AND PLAN:   64 year old African American female history of nephrotic syndrome, amyloidosis, essential hypertension presenting on 5/25 with altered mental status/confusion after apparently falling out of bed. Admitted for septic shock  * Septic shock with right flank/thigh cellulitis with streptococcus bacteremia - ID following - Changed to Levaquin till 11/13/2014 and clindamycin stopped. Continue pressors. Off steroids. - Echo- No vegetations. - Repeat blood cultures negative to date.  * Acute renal failure over CKD3 due to ATN. From septic shock in the setting of amyloidosis On CRRT. Appreciate nephrology help. Severe hypoalbuminemia. Will likely progress to ESRD.  * Acute liver failure- Due to sepsis - No improvement.  CT abd/pelvis showed no biliary dilation or obstruction Severe hyperbilirubinemia > 30. Gastroenterology following.  lactulose  * Elevated INR - Due to liver failure. DIC unlikely per oncology. 4 units FFP given 6/1 minimal improvement in INR  * Acute encephalopathy - Due to acute illness. CT head nothing acute.  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 Plan was for chemo as OP with Dr.  Grayland Ormond but now  on hold due to acute illness. Oncology following  * Upper GI bleed with Acute blood loss  anemia over AOCD EGD showed severe esophagitis. On protonix  IV Transfused 4 units this admission.  * Type 2 diabetes, uncomplicated: Hold oral agents, at insulin slide scale every 6 hours Accu-Chek  * Essential hypertension: Held all meds  * Venous thromboembolism prophylactic: Heparin subcutaneous stopped due to high INR and low platelets and GIB  * Nutrition TFs  * Acute respiratory failure -Full vent support - due to suspected aspiration of gastrointestinal content - Pulmonology following - Full ventilator support  CODE STATUS: FULL CODE  Multi organ failure. High risk for cardiac arrest and death   TOTAL CRITICAL CARE TIME TAKING CARE OF THIS PATIENT: 35 minutes.    Hillary Bow R M.D on 10/12/2014 at 11:01 AM  Between 7am to 6pm - Pager - [phone]  After 6pm go to [email or web address] - password [email or web address] [facility]  [facility]  Office  [phone]  CC: Primary care physician; [name], MD

## 2014-10-13 ENCOUNTER — Inpatient Hospital Stay: Payer: Medicare Other

## 2014-10-13 LAB — MAGNESIUM: Magnesium: 1.7 mg/dL (ref 1.7–2.4)

## 2014-10-13 LAB — COMPREHENSIVE METABOLIC PANEL
ALT: 87 U/L — ABNORMAL HIGH (ref 14–54)
AST: 301 U/L — ABNORMAL HIGH (ref 15–41)
Albumin: 2.3 g/dL — ABNORMAL LOW (ref 3.5–5.0)
Alkaline Phosphatase: 1361 U/L — ABNORMAL HIGH (ref 38–126)
Anion gap: 8 (ref 5–15)
BUN: 27 mg/dL — AB (ref 6–20)
CHLORIDE: 103 mmol/L (ref 101–111)
CO2: 27 mmol/L (ref 22–32)
Calcium: 9.3 mg/dL (ref 8.9–10.3)
GLUCOSE: 151 mg/dL — AB (ref 65–99)
Potassium: 4.6 mmol/L (ref 3.5–5.1)
Sodium: 138 mmol/L (ref 135–145)
TOTAL PROTEIN: 6.8 g/dL (ref 6.5–8.1)
Total Bilirubin: 32.7 mg/dL (ref 0.3–1.2)

## 2014-10-13 LAB — RENAL FUNCTION PANEL
ANION GAP: UNDETERMINED (ref 5–15)
Albumin: 2.1 g/dL — ABNORMAL LOW (ref 3.5–5.0)
BUN: 25 mg/dL — ABNORMAL HIGH (ref 6–20)
CHLORIDE: 102 mmol/L (ref 101–111)
CO2: 28 mmol/L (ref 22–32)
Calcium: 9.3 mg/dL (ref 8.9–10.3)
Creatinine, Ser: UNDETERMINED mg/dL (ref 0.44–1.00)
Glucose, Bld: 199 mg/dL — ABNORMAL HIGH (ref 65–99)
POTASSIUM: 4.5 mmol/L (ref 3.5–5.1)
Sodium: UNDETERMINED mmol/L (ref 135–145)

## 2014-10-13 LAB — GLUCOSE, CAPILLARY
GLUCOSE-CAPILLARY: 142 mg/dL — AB (ref 65–99)
GLUCOSE-CAPILLARY: 153 mg/dL — AB (ref 65–99)
GLUCOSE-CAPILLARY: 163 mg/dL — AB (ref 65–99)
GLUCOSE-CAPILLARY: 164 mg/dL — AB (ref 65–99)
GLUCOSE-CAPILLARY: 172 mg/dL — AB (ref 65–99)
GLUCOSE-CAPILLARY: 176 mg/dL — AB (ref 65–99)
GLUCOSE-CAPILLARY: 191 mg/dL — AB (ref 65–99)
GLUCOSE-CAPILLARY: 194 mg/dL — AB (ref 65–99)
GLUCOSE-CAPILLARY: 198 mg/dL — AB (ref 65–99)
GLUCOSE-CAPILLARY: 209 mg/dL — AB (ref 65–99)
GLUCOSE-CAPILLARY: 220 mg/dL — AB (ref 65–99)
Glucose-Capillary: 98 mg/dL (ref 65–99)
Glucose-Capillary: 92 mg/dL (ref 65–99)
Glucose-Capillary: 98 mg/dL (ref 65–99)
Glucose-Capillary: 151 mg/dL — ABNORMAL HIGH (ref 65–99)
Glucose-Capillary: 235 mg/dL — ABNORMAL HIGH (ref 65–99)
Glucose-Capillary: 141 mg/dL — ABNORMAL HIGH (ref 65–99)
Glucose-Capillary: 137 mg/dL — ABNORMAL HIGH (ref 65–99)
Glucose-Capillary: 120 mg/dL — ABNORMAL HIGH (ref 65–99)
Glucose-Capillary: 159 mg/dL — ABNORMAL HIGH (ref 65–99)
Glucose-Capillary: 172 mg/dL — ABNORMAL HIGH (ref 65–99)
Glucose-Capillary: 168 mg/dL — ABNORMAL HIGH (ref 65–99)
Glucose-Capillary: 198 mg/dL — ABNORMAL HIGH (ref 65–99)
Glucose-Capillary: 168 mg/dL — ABNORMAL HIGH (ref 65–99)
Glucose-Capillary: 179 mg/dL — ABNORMAL HIGH (ref 65–99)
Glucose-Capillary: 173 mg/dL — ABNORMAL HIGH (ref 65–99)
Glucose-Capillary: 175 mg/dL — ABNORMAL HIGH (ref 65–99)
Glucose-Capillary: 146 mg/dL — ABNORMAL HIGH (ref 65–99)

## 2014-10-13 LAB — CBC
HCT: 29.8 % — ABNORMAL LOW (ref 35.0–47.0)
HEMOGLOBIN: 9.8 g/dL — AB (ref 12.0–16.0)
MCH: 30.8 pg (ref 26.0–34.0)
MCHC: 33 g/dL (ref 32.0–36.0)
MCV: 93.6 fL (ref 80.0–100.0)
PLATELETS: 91 10*3/uL — AB (ref 150–440)
RBC: 3.18 MIL/uL — ABNORMAL LOW (ref 3.80–5.20)
RDW: 18.1 % — ABNORMAL HIGH (ref 11.5–14.5)
WBC: 20.6 10*3/uL — AB (ref 3.6–11.0)

## 2014-10-13 LAB — PHOSPHORUS

## 2014-10-13 LAB — PROTIME-INR
INR: 2.03
PROTHROMBIN TIME: 23.1 s — AB (ref 11.4–15.0)

## 2014-10-13 LAB — APTT: APTT: 45 s — AB (ref 24–36)

## 2014-10-13 MED ORDER — MAGNESIUM SULFATE 2 GM/50ML IV SOLN
2.0000 g | Freq: Once | INTRAVENOUS | Status: AC
  Administered 2014-10-13: 2 g via INTRAVENOUS
  Filled 2014-10-13 (×2): qty 50

## 2014-10-13 MED ORDER — IPRATROPIUM-ALBUTEROL 0.5-2.5 (3) MG/3ML IN SOLN
3.0000 mL | Freq: Four times a day (QID) | RESPIRATORY_TRACT | Status: DC
  Administered 2014-10-14 – 2014-10-18 (×17): 3 mL via RESPIRATORY_TRACT
  Filled 2014-10-13 (×16): qty 3

## 2014-10-13 MED ORDER — TRACE MINERALS CR-CU-MN-SE-ZN 10-1000-500-60 MCG/ML IV SOLN
INTRAVENOUS | Status: AC
  Administered 2014-10-13: 18:00:00 via INTRAVENOUS
  Filled 2014-10-13: qty 1680

## 2014-10-13 NOTE — Progress Notes (Signed)
[place] [facility] [place] NAME: Nashaly Dorantes    MR#:  [id number]  DATE OF BIRTH:  [birth year]  SUBJECTIVE:  CHIEF COMPLAINT:   Chief Complaint  Patient presents with  . Altered Mental Status    Critically ill. On pressors. CRRT. Full vent support. Sedated.  REVIEW OF SYSTEMS:    ROS  Unable to give history due to  patient intubated   DRUG ALLERGIES:  No Known Allergies  VITALS:  Blood pressure 88/54, pulse 88, temperature 98.1 F (36.7 C), temperature source Oral, resp. rate 18, height 5\' 1"  (1.549 m), weight 74.39 kg (164 lb), SpO2 98 %.  PHYSICAL EXAMINATION:   Physical Exam  GENERAL:  64 y.o.-year-old patient lying in the bed critically ill EYES: Pupils equal, round, reactive to light and accommodation. + scleral icterus.  HEENT: Head atraumatic, normocephalic.Intubated NECK:  Supple, no jugular venous distention. No thyroid enlargement, no tenderness. Right IJ . Right femoral Dialysis catheter. LUNGS: Coarse breath sounds, poor air movement, vent CARDIOVASCULAR: S1, S2 normal. No murmurs. ABDOMEN: Soft, nontender, nondistended. Bowel sounds decreased. No organomegaly or mass. EXTREMITIES: No cyanosis, clubbing. Anasarca. NEUROLOGIC:  Sedated. SKIN:  Superficial ulcer right thigh.  LABORATORY PANEL:   CBC  Recent Labs Lab 10/13/14 0439  WBC 20.6*  HGB 9.8*  HCT 29.8*  PLT 91*   ------------------------------------------------------------------------------------------------------------------  Chemistries   Recent Labs Lab 10/13/14 0439  NA 138  K 4.6  CL 103  CO2 27  GLUCOSE 151*  BUN 27*  CREATININE NOT VALID  CALCIUM 9.3  MG 1.7  AST 301*  ALT 87*  ALKPHOS 1361*  BILITOT 32.7*   ------------------------------------------------------------------------------------------------------------------  Cardiac Enzymes No results for input(s): TROPONINI in the last 168  hours. ------------------------------------------------------------------------------------------------------------------  RADIOLOGY:  Dg Chest Port 1 View  10/07/2014   IMPRESSION: 1. Worsened bilateral airspace opacities. In the context of the patient's moderate enlargement of the cardiopericardial silhouette, the appearance is suspicious for acute pulmonary edema. 2. The endotracheal tube has been successfully retracted, and the tubes and lines appear satisfactorily positioned.   Electronically Signed   By: Van Clines M.D.   On: 10/07/2014 08:42     ASSESSMENT AND PLAN:   64 year old African American female history of nephrotic syndrome, amyloidosis, essential hypertension presenting on 5/25 with altered mental status/confusion after apparently falling out of bed. Admitted for septic shock  * Septic shock with right flank/thigh cellulitis with streptococcus bacteremia - ID following - Changed to Levaquin till 11-08-14 and clindamycin stopped. Continue pressors. Off steroids. - Echo- No vegetations. Repeat blood cultures negative to date.  * Acute renal failure over CKD3 due to ATN. From septic shock in the setting of amyloidosis On CRRT. Appreciate nephrology help. Severe hypoalbuminemia. Will likely progress to ESRD.  * Acute liver failure- Due to sepsis - No improvement.  CT abd/pelvis showed no biliary dilation or obstruction Severe hyperbilirubinemia > 30. Gastroenterology following.  lactulose  * Elevated INR - Due to liver failure. DIC unlikely per oncology. 4 units FFP given 6/1 minimal improvement in INR  * Acute encephalopathy - Due to acute illness. CT head nothing acute.  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 Plan was for chemo as OP with Dr. Grayland Ormond but now on hold due to acute illness. Oncology following  * Upper GI bleed with Acute blood loss  anemia over AOCD EGD showed severe esophagitis. On protonix  IV Transfused 4 units this  admission.  * Type 2 diabetes, uncomplicated: Hold  oral agents, at insulin slide scale every 6 hours Accu-Chek  * Essential hypertension: Held all meds  * Venous thromboembolism prophylactic: Heparin subcutaneous stopped due to high INR and low platelets and GIB  * Nutrition TPN  * Acute respiratory failure -Full vent support - due to suspected aspiration of gastrointestinal content - Pulmonology following - Full ventilator support  * Trach and PEG ENT consulted. Will check INR. FFP prior to procedure if needed. Will be high risk for any procedure with co-morbidities.  CODE STATUS: FULL CODE  Multi organ failure. High risk for cardiac arrest and death   TOTAL CRITICAL CARE TIME TAKING CARE OF THIS PATIENT: 35 minutes.    Hillary Bow R M.D on 10/13/2014 at 12:22 PM  Between 7am to 6pm - Pager - [phone]  After 6pm go to [email or web address] - password [email or web address] [email or web address]  [facility]  Office  [phone]  CC: Primary care physician; [name], MD

## 2014-10-13 NOTE — Progress Notes (Signed)
Follow up Note - Critical Care Medicine Note  Patient Details:    [name] is an 64 y.o. female with known history of amyloidosis nephrotic syndrome HTN who presetnted with altered mental status. Patient was noted to be septic on admission and started onb antibiotics. Subsequently has grown Streptococcus in blood on ventilator has failed SBT attempts.  Lines/tubes : Airway 7.5 mm (Active)  Secured at (cm) 21 cm 10/13/2014 12:07 PM  Measured From Lips 10/13/2014 12:07 PM  Central 10/13/2014 12:07 PM  Secured By [employer] 10/13/2014 12:07 PM  Tube Holder Repositioned Yes 10/12/2014  8:13 AM  Cuff Pressure (cm H2O) 22 cm H2O 10/13/2014 12:07 PM  Site Condition Cool;Dry 10/13/2014 12:07 PM     CVC Triple Lumen 10/03/14 Right Internal jugular (Active)  Indication for Insertion or Continuance of Line Vasoactive infusions 10/13/2014  7:28 AM  Site Assessment Clean;Dry 10/13/2014  7:28 AM  Proximal Lumen Status Infusing 10/13/2014  7:28 AM  Medial Infusing 10/13/2014  7:28 AM  Distal Lumen Status Infusing 10/13/2014  7:28 AM  Dressing Type Transparent 10/13/2014  7:28 AM  Dressing Status Clean;Dry;Antimicrobial disc in place 10/13/2014  7:28 AM  Line Care Connections checked and tightened 10/13/2014  7:28 AM  Dressing Intervention New dressing;Dressing changed;Antimicrobial disc changed 10/12/2014  5:00 AM  Dressing Change Due 11-09-14 10/13/2014  7:28 AM     NG/OG Tube Orogastric 18 Fr. Center mouth (Active)  Placement Verification Auscultation 10/13/2014 11:59 AM  Site Assessment Clean;Dry 10/13/2014 11:59 AM  Status Clamped 10/13/2014 11:59 AM  Drainage Appearance Clear;Yellow 10/12/2014  7:21 AM  Gastric Residual 370 mL 10/08/2014 12:00 AM  Intake (mL) 40 mL 10/11/2014  8:00 PM  Output (mL) 300 mL 10/13/2014  6:00 AM     Urethral Catheter unknown Latex 16 Fr. (Active)  Indication for Insertion or Continuance of Catheter Other (comment) 10/13/2014 11:59 AM  Site Assessment Clean;Intact  10/13/2014 11:59 AM  Catheter Maintenance Bag below level of bladder;Catheter secured;Drainage bag/tubing not touching floor;Insertion date on drainage bag;No dependent loops;Seal intact 10/13/2014 11:59 AM  Collection Container Standard drainage bag 10/13/2014 11:59 AM  Securement Method Securing device (Describe) 10/13/2014 11:59 AM  Urinary Catheter Interventions Unclamped 10/09/2014  5:00 PM  Input (mL) 30 mL 10/12/2014 10:00 PM  Output (mL) 0 mL 10/12/2014  6:00 PM    Microbiology/Sepsis markers: Results for orders placed or performed during the hospital encounter of 10/04/2014  Blood culture (routine x 2)     Status: None   Collection Time: 09/30/2014 12:45 AM  Result Value Ref Range Status   Specimen Description BLOOD  Final   Special Requests NONE  Final   Culture  Setup Time   Final    GRAM POSITIVE COCCI IN CHAINS IN BOTH AEROBIC AND ANAEROBIC BOTTLES CRITICAL RESULT CALLED TO, READ BACK BY AND VERIFIED WITH: PAM CRAWFORD AT 1406 09/30/2014 DV    Culture STREPTOCOCCUS SPECIES  Final   Report Status 10/01/2014 FINAL  Final   Organism ID, Bacteria STREPTOCOCCUS SPECIES  Final      Susceptibility   Streptococcus species - MIC (ETEST)*    ERYTHROMYCIN Value in next row Resistant      RESISTANT6    PENICILLIN Value in next row Intermediate      INTERMEDIATE0.25    CEFTRIAXONE Value in next row Sensitive      SENSITIVE0.047    VANCOMYCIN Value in next row Sensitive      SENSITIVE1.0    LEVOFLOXACIN Value in  next row Sensitive      SENSITIVE1.0    * STREPTOCOCCUS SPECIES  Blood culture (routine x 2)     Status: None   Collection Time: 09/23/2014 12:45 AM  Result Value Ref Range Status   Specimen Description BLOOD  Final   Special Requests NONE  Final   Culture  Setup Time   Final    GRAM POSITIVE COCCI IN CHAINS IN BOTH AEROBIC AND ANAEROBIC BOTTLES CRITICAL RESULT CALLED TO, READ BACK BY AND VERIFIED WITH: PAM CRAWFORD AT 1406 09/19/2014 DV    Culture   Final    STREPTOCOCCUS SPECIES REFER  TO ACCESSION [id number] COLLECTED ON 10/01/2014 AT 0045 FOR SENSITIVITIES    Report Status 09/30/2014 FINAL  Final  Culture, blood (routine x 2)     Status: None   Collection Time: 10/04/14 11:50 AM  Result Value Ref Range Status   Specimen Description BLOOD  Final   Special Requests NONE  Final   Culture NO GROWTH 5 DAYS  Final   Report Status 10/09/2014 FINAL  Final  Culture, blood (routine x 2)     Status: None   Collection Time: 10/04/14 12:00 PM  Result Value Ref Range Status   Specimen Description BLOOD  Final   Special Requests NONE  Final   Culture NO GROWTH 5 DAYS  Final   Report Status 10/09/2014 FINAL  Final  Culture, respiratory (NON-Expectorated)     Status: None   Collection Time: 10/04/14  2:00 PM  Result Value Ref Range Status   Specimen Description INDUCED SPUTUM  Final   Special Requests Normal  Final   Gram Stain   Final    EXCELLENT SPECIMEN - 90-100% WBCS MANY WBC SEEN MANY YEAST    Culture MODERATE GROWTH CANDIDA ALBICANS  Final   Report Status 10/07/2014 FINAL  Final  Culture, blood (routine x 2)     Status: None (Preliminary result)   Collection Time: 10/12/14  1:34 PM  Result Value Ref Range Status   Specimen Description BLOOD  Final   Special Requests Normal  Final   Culture NO GROWTH < 24 HOURS  Final   Report Status PENDING  Incomplete  Culture, blood (routine x 2)     Status: None (Preliminary result)   Collection Time: 10/12/14  4:30 PM  Result Value Ref Range Status   Specimen Description BLOOD  Final   Special Requests Normal  Final   Culture NO GROWTH < 24 HOURS  Final   Report Status PENDING  Incomplete    Anti-infectives:  Anti-infectives    Start     Dose/Rate Route Frequency Ordered Stop   10/12/14 0930  erythromycin 250 mg in sodium chloride 0.9 % 100 mL IVPB     250 mg 100 mL/hr over 60 Minutes Intravenous 3 times per day 10/12/14 0929     10/16/2014 1800  levofloxacin (LEVAQUIN) IVPB 500 mg     500 mg 100 mL/hr over 60 Minutes  Intravenous Every 24 hours 10/25/2014 0944 10/23/14 2359   10/05/14 1430  levofloxacin (LEVAQUIN) IVPB 750 mg  Status:  Discontinued     750 mg 100 mL/hr over 90 Minutes Intravenous Every 24 hours 10/05/14 1420 10/17/2014 0944   10/01/14 1800  cefTRIAXone (ROCEPHIN) 2 g in dextrose 5 % 50 mL IVPB - Premix  Status:  Discontinued     2 g 100 mL/hr over 30 Minutes Intravenous Every 24 hours 09/30/14 1621 10/05/14 1409   09/30/14 1630  cefTRIAXone (ROCEPHIN) 1  g in dextrose 5 % 50 mL IVPB - Premix  Status:  Discontinued     1 g 100 mL/hr over 30 Minutes Intravenous Every 24 hours 09/30/14 1621 10/01/14 1114   09/30/14 1545  cefTRIAXone (ROCEPHIN) 1 g in dextrose 5 % 50 mL IVPB - Premix  Status:  Discontinued     1 g 100 mL/hr over 30 Minutes Intravenous Every 24 hours 09/30/14 1535 09/30/14 1621   09/30/14 1545  clindamycin (CLEOCIN) IVPB 600 mg  Status:  Discontinued     600 mg 100 mL/hr over 30 Minutes Intravenous 3 times per day 09/30/14 1535 10/10/14 1453   10/02/2014 1300  vancomycin (VANCOCIN) 1,250 mg in sodium chloride 0.9 % 250 mL IVPB  Status:  Discontinued     1,250 mg 166.7 mL/hr over 90 Minutes Intravenous Every 24 hours 09/19/2014 0514 09/10/2014 1200   09/13/2014 1300  vancomycin (VANCOCIN) IVPB 1000 mg/200 mL premix  Status:  Discontinued     1,000 mg 200 mL/hr over 60 Minutes Intravenous Every 24 hours 09/07/2014 1200 09/30/14 1535   09/16/2014 0600  piperacillin-tazobactam (ZOSYN) IVPB 3.375 g  Status:  Discontinued     3.375 g 12.5 mL/hr over 240 Minutes Intravenous 3 times per day 10/04/2014 0508 09/29/14 1019   09/12/2014 0045  vancomycin (VANCOCIN) IVPB 1000 mg/200 mL premix     1,000 mg 200 mL/hr over 60 Minutes Intravenous  Once 09/15/2014 0043 09/08/2014 0229   10/02/2014 0045  piperacillin-tazobactam (ZOSYN) IVPB 3.375 g     3.375 g 12.5 mL/hr over 240 Minutes Intravenous  Once 09/26/2014 0043 09/18/2014 0251       Consults: Treatment Team:  Murlean Iba, MD Adrian Prows,  MD Lloyd Huger, MD Josefine Class, MD Clyde Canterbury, MD    Studies: Ct Abdomen Pelvis Wo Contrast  10/01/2014   CLINICAL DATA:  Right flank cellulitis and edema.  EXAM: CT ABDOMEN AND PELVIS WITHOUT CONTRAST  TECHNIQUE: Multidetector CT imaging of the abdomen and pelvis was performed following the standard protocol without IV contrast.  COMPARISON:  08/31/2014  FINDINGS: Lower chest: Small bilateral pleural effusions are noted left greater than right. There is airspace consolidation noted within both lung bases. Patchy ground-glass and airspace opacity within the medial left upper lobe is noted, image number 3/series 4.  Hepatobiliary: There is no suspicious liver abnormality identified. Increased attenuation within the gallbladder may Roux represent stones and/or sludge. No biliary dilatation.  Pancreas: Normal appearance of the pancreas.  Spleen: The spleen is on unremarkable.  Adrenals/Urinary Tract: Normal appearance of the left adrenal gland. Similar appearance of low-attenuation right adrenal gland nodule measuring 2.9 cm, image 30/series 2. The kidneys are both on unremarkable. The urinary bladder is collapsed around a Foley catheter balloon.  Stomach/Bowel: Moderate distension of the stomach containing fluid level. The small bowel loops have a normal course and caliber. Normal appearance of the colon.  Vascular/Lymphatic: There is a right femoral dialysis catheter with tip in the IVC. Abdominal aorta has a normal caliber. No adenopathy.  Reproductive: Previous hysterectomy.  No adnexal mass.  Other: Moderate free fluid is identified within the abdomen and pelvis. There is diffuse body wall edema and skin thickening. No focal fluid collection identified to suggest abscess.  Musculoskeletal: Degenerative disc disease noted within the lumbar spine.  IMPRESSION: 1. Bilateral pleural effusions, ascites and anasarca is identified consistent with fluid overload state. 2. Subcutaneous fat  stranding and skin thickening may be related to anasarca or cellulitis. 3. No fluid  collections identified. 4. Left lung opacities may reflect pneumonia and/r or areas of asymmetric edema.   Electronically Signed   By: Kerby Moors M.D.   On: 10/01/2014 15:17   Dg Chest 1 View  09/27/2014   CLINICAL DATA:  Central line placement.  EXAM: CHEST  1 VIEW  COMPARISON:  06/26/2014  FINDINGS: Right IJ central line with tip crossing midline and terminating in the region of the left brachiocephalic vein.  Hypoventilation. When accounting for this there is no cardiomegaly or change in aortic tortuosity. No pneumothorax, edema, effusion, or definitive pneumonia.  These results were called by telephone at the time of interpretation on 09/16/2014 at 3:14 am to [place] , who verbally acknowledged these results.  IMPRESSION: 1. Right IJ central line with tip crossing midline and projecting at the left brachiocephalic vein. No pneumothorax. 2. Hypoventilation.   Electronically Signed   By: Monte Fantasia M.D.   On: 09/06/2014 03:15   Ct Head Wo Contrast  09/22/2014   CLINICAL DATA:  Altered mental status after fall from couch. Posterior neck pain. Initial encounter.  EXAM: CT HEAD WITHOUT CONTRAST  CT CERVICAL SPINE WITHOUT CONTRAST  TECHNIQUE: Multidetector CT imaging of the head and cervical spine was performed following the standard protocol without intravenous contrast. Multiplanar CT image reconstructions of the cervical spine were also generated.  COMPARISON:  None.  FINDINGS: CT HEAD FINDINGS  Skull and Sinuses:Negative for fracture or destructive process. The mastoids, middle ears, and imaged paranasal sinuses are clear.  Orbits: No acute abnormality.  Brain: No evidence of acute infarction, hemorrhage, hydrocephalus, or mass lesion/mass effect.  CT CERVICAL SPINE FINDINGS  Thyroid gland has indistinct margins with mild surrounding fat stranding. A sub cm dense nodule is noted on the right. No surrounding  adenopathy.  No evidence of acute fracture or traumatic malalignment. No gross cervical canal hematoma. There is degenerative disc disease and endplate spurring, most progressed at C4-5, C5-6, and C6-7. Spurring is most notable at C5-6 with right uncovertebral spurs causing advanced foraminal stenosis. A superimposed right paracentral disc herniation with vacuum phenomenon contributes to the C5-6 right foraminal stenosis.  IMPRESSION: 1. No evidence of intracranial or cervical spine injury. 2. Mid and lower cervical degenerative disc disease with foraminal stenoses, most advanced on the right at C5-6. 3. Edema noted around the thyroid gland, suspicious for thyroiditis. Correlate with endocrine history and thyroid function tests.   Electronically Signed   By: Monte Fantasia M.D.   On: 09/17/2014 02:31   Ct Cervical Spine Wo Contrast  09/08/2014   CLINICAL DATA:  Altered mental status after fall from couch. Posterior neck pain. Initial encounter.  EXAM: CT HEAD WITHOUT CONTRAST  CT CERVICAL SPINE WITHOUT CONTRAST  TECHNIQUE: Multidetector CT imaging of the head and cervical spine was performed following the standard protocol without intravenous contrast. Multiplanar CT image reconstructions of the cervical spine were also generated.  COMPARISON:  None.  FINDINGS: CT HEAD FINDINGS  Skull and Sinuses:Negative for fracture or destructive process. The mastoids, middle ears, and imaged paranasal sinuses are clear.  Orbits: No acute abnormality.  Brain: No evidence of acute infarction, hemorrhage, hydrocephalus, or mass lesion/mass effect.  CT CERVICAL SPINE FINDINGS  Thyroid gland has indistinct margins with mild surrounding fat stranding. A sub cm dense nodule is noted on the right. No surrounding adenopathy.  No evidence of acute fracture or traumatic malalignment. No gross cervical canal hematoma. There is degenerative disc disease and endplate spurring, most progressed at C4-5, C5-6,  and C6-7. Spurring is most  notable at C5-6 with right uncovertebral spurs causing advanced foraminal stenosis. A superimposed right paracentral disc herniation with vacuum phenomenon contributes to the C5-6 right foraminal stenosis.  IMPRESSION: 1. No evidence of intracranial or cervical spine injury. 2. Mid and lower cervical degenerative disc disease with foraminal stenoses, most advanced on the right at C5-6. 3. Edema noted around the thyroid gland, suspicious for thyroiditis. Correlate with endocrine history and thyroid function tests.   Electronically Signed   By: Monte Fantasia M.D.   On: 09/24/2014 02:31   Nm Cardiac Muga Rest  09/20/2014   CLINICAL DATA:  . Evaluate cardiac function in relation to chemotherapy.  EXAM: NUCLEAR MEDICINE CARDIAC BLOOD POOL IMAGING (MUGA)  TECHNIQUE: Cardiac multi-gated acquisition was performed at rest following intravenous injection of Tc-2m labeled red blood cells.  RADIOPHARMACEUTICALS:  21.9 mCi Technetium-68m in-vitro labeled red blood cells IV  COMPARISON:  None.  FINDINGS: No  focal wall motion abnormality of the left ventricle.  Calculated left ventricular ejection fraction equals 65%  IMPRESSION: Left ventricular ejection fraction equals 65%   Electronically Signed   By: Suzy Bouchard M.D.   On: 09/20/2014 09:31   Dg Chest Port 1 View  10/07/2014   CLINICAL DATA:  Respiratory failure/ventilator dependency.  EXAM: PORTABLE CHEST - 1 VIEW  COMPARISON:  10/03/2014  FINDINGS: Endotracheal tube tip 3.1 cm above the carina. Right internal jugular line tip: Upper SVC. Nasogastric tube enters the stomach.  Moderate enlargement of the cardiopericardial silhouette noted with bilateral increased airspace opacities.  Bilateral degenerative glenohumeral arthropathy. Tortuous thoracic aorta.  IMPRESSION: 1. Worsened bilateral airspace opacities. In the context of the patient's moderate enlargement of the cardiopericardial silhouette, the appearance is suspicious for acute pulmonary edema. 2. The  endotracheal tube has been successfully retracted, and the tubes and lines appear satisfactorily positioned.   Electronically Signed   By: Van Clines M.D.   On: 10/07/2014 08:42   Dg Chest Port 1 View  10/03/2014   CLINICAL DATA:  Orogastric and endotracheal tube placement.  EXAM: PORTABLE CHEST - 1 VIEW  COMPARISON:  10/03/2014 at 12:51 p.m.  FINDINGS: Orogastric tube passes below the diaphragm to curl within a mostly decompressed stomach.  Endotracheal tube tip projects the right mainstem bronchus.  Patchy airspace consolidation is noted in the peripheral right upper mid and lower lung and inferior to the right hilum.  Hazy left sided opacity noted previously has mostly resolved. There is mild residual opacity at the left lung base likely atelectasis. There is no pulmonary edema.  No pneumothorax.  IMPRESSION: 1. Endotracheal tube is now positioned. Tip is in the right mainstem bronchus. It will need to be retracted 3 cm for optimal positioning. Critical Value/emergent results were called by telephone at the time of interpretation on 10/03/2014 at 5:25 pm to the nurse in charge of this patient, who verbally acknowledged these results and will relay these results to respiratory. 2. Persistent right lung consolidation. Improved left lung aeration. 3. Orogastric tube well positioned curling within a mostly decompressed stomach.   Electronically Signed   By: Lajean Manes M.D.   On: 10/03/2014 17:26   Dg Chest Port 1 View  10/03/2014   CLINICAL DATA:  Status post central line adjustment  EXAM: PORTABLE CHEST - 1 VIEW  COMPARISON:  10/03/2014  FINDINGS: Cardiac shadow is stable. Increased density is noted over the left hemi thorax consistent with a posterior fusion. The previously seen central line is been repositioned  and now lies within the proximal superior vena cava. No pneumothorax is noted. Patchy changes are seen within the lungs.  IMPRESSION: Patchy infiltrative changes are again identified  bilaterally. Diffuse increased density is noted on the left which may be related to an evolving effusion. The central line has been repositioned without evidence of pneumothorax.   Electronically Signed   By: Inez Catalina M.D.   On: 10/03/2014 13:19   Dg Chest Port 1 View  10/03/2014   CLINICAL DATA:  Central line adjustment.  EXAM: PORTABLE CHEST - 1 VIEW  COMPARISON:  10/03/2014 at 10:18 a.m.  FINDINGS: Right sided central line curls overlying the right neck. This does not enter the thorax. It is not changed from the earlier study.  No change in the appearance of the lungs.  No pneumothorax.  IMPRESSION: Right central line is seen curling over the neck, not entering thorax and not changed from the prior study.   Electronically Signed   By: Lajean Manes M.D.   On: 10/03/2014 12:13   Dg Chest Port 1 View  10/03/2014   CLINICAL DATA:  Central line placement  EXAM: PORTABLE CHEST - 1 VIEW  COMPARISON:  None.  FINDINGS: Cardiomediastinal silhouette is stable. Patchy airspace is right upper lobe left midlung and left lower lobe again noted suspicious for multifocal pneumonia. There is only partially visualized tortuous central line in right neck region. Clinical correlation is necessary. There is no pneumothorax.  IMPRESSION: Again noted patchy airspace disease bilaterally suspicious for multifocal pneumonia. Tortuous only partially visualized central line in right neck region. Probable significant retracted right IJ line. Clinical correlation is necessary. These results were called by telephone at the time of interpretation on 10/03/2014 at 10:53 am to ICU. Nurse Sheryn Bison , who verbally acknowledged these results.   Electronically Signed   By: Lahoma Crocker M.D.   On: 10/03/2014 10:55   Dg Chest Port 1 View  10/02/2014   CLINICAL DATA:  Shortness of breath, acute onset. Initial encounter.  EXAM: PORTABLE CHEST - 1 VIEW  COMPARISON:  Chest radiograph performed 09/04/2014  FINDINGS: The lungs are hypoexpanded.  Patchy bilateral airspace opacities are noted, raising concern for multifocal pneumonia. Underlying asymmetric interstitial edema on the left side cannot be excluded. A small left pleural effusion is suspected. No pneumothorax is seen.  The cardiomediastinal silhouette is mildly enlarged. No acute osseous abnormalities are seen. A right IJ line is noted ending overlying the left brachiocephalic vein.  IMPRESSION: 1. Lungs hypoexpanded. Patchy bilateral airspace opacities raise concern for multifocal pneumonia. 2. Underlying asymmetric interstitial edema on the left side cannot be excluded. Suspect small left pleural effusion. 3. Mild cardiomegaly noted. 4. Right IJ line noted ending overlying the left brachiocephalic vein, as noted on the prior study. It appears to have been retracted mildly since the prior study.   Electronically Signed   By: Garald Balding M.D.   On: 10/02/2014 20:31   Dg Chest Portable 1 View  09/24/2014   CLINICAL DATA:  Central line adjustment  EXAM: PORTABLE CHEST - 1 VIEW  COMPARISON:  Same day at 2:43 a.m.  FINDINGS: Shortening of the right IJ central line, but tip still terminates left of midline near the brachiocephalic vein.  Hypoventilation. Stable heart size and mediastinal contours, size accentuated by technique. There is interstitial crowding without edema, effusion, pneumothorax, or overt pneumonia.  IMPRESSION: 1. Despite right central line shortening the tip still terminates near the left brachiocephalic vein. 2. Hypoventilation.   Electronically Signed  By: Monte Fantasia M.D.   On: 09/21/2014 04:28   Dg Abd Portable 1v  10/03/2014   CLINICAL DATA:  OG tube placement  EXAM: PORTABLE ABDOMEN - 1 VIEW  COMPARISON:  CT abdomen pelvis dated 09/23/2014  FINDINGS: Enteric tube terminates in the gastric cardia.  Nonobstructive bowel gas pattern.  IMPRESSION: Enteric tube terminates in the gastric cardia.   Electronically Signed   By: Julian Hy M.D.   On: 10/03/2014 17:22      Events:  Subjective:    Overnight Issues: Patient remains on the ventilator at this time. PRVC mode requiring 35% oxygen at this time. PEEP is at 5 presently   Objective:  Vital signs for last 24 hours: Temp:  [97.4 F (36.3 C)-98.1 F (36.7 C)] 98.1 F (36.7 C) (06/09 1100) Pulse Rate:  [85-94] 88 (06/09 1100) Resp:  [11-22] 18 (06/09 1100) BP: (83-108)/(51-80) 88/54 mmHg (06/09 1100) SpO2:  [94 %-100 %] 98 % (06/09 1207) FiO2 (%):  [35 %] 35 % (06/09 1207) Weight:  [74.39 kg (164 lb)] 74.39 kg (164 lb) (06/09 0200)  Hemodynamic parameters for last 24 hours:    Intake/Output from previous day: 06/08 0701 - 06/09 0700 In: 4364.1 [I.V.:1064.1; IV Piggyback:400; TPN:2870] Out: 4498 [Urine:30; Emesis/NG output:300; Stool:1]  Intake/Output this shift: Total I/O In: 130.2 [I.V.:130.2] Out: 383 [Other:383]  Vent settings for last 24 hours: Vent Mode:  [-] PRVC FiO2 (%):  [35 %] 35 % Set Rate:  [15 bmp] 15 bmp Vt Set:  [500 mL] 500 mL PEEP:  [5 cmH20] 5 cmH20  Physical Exam:  Sedated on the vent at this time HEENT: Normocephalic atraumatic Eyes PERRLA no icterus Neck Supple no JVP noted Resp: Scattered ronchi course breath sounds Cardio: RRR no gallop noted Neuro: sedated GCS<8T  Results for orders placed or performed during the hospital encounter of 10/04/2014 (from the past 24 hour(s))  Culture, blood (routine x 2)     Status: None (Preliminary result)   Collection Time: 10/12/14  1:34 PM  Result Value Ref Range   Specimen Description BLOOD    Special Requests Normal    Culture NO GROWTH < 24 HOURS    Report Status PENDING   Glucose, capillary     Status: Abnormal   Collection Time: 10/12/14  2:12 PM  Result Value Ref Range   Glucose-Capillary 155 (H) 65 - 99 mg/dL  Glucose, capillary     Status: Abnormal   Collection Time: 10/12/14  3:16 PM  Result Value Ref Range   Glucose-Capillary 170 (H) 65 - 99 mg/dL  Renal function     Status: Abnormal    Collection Time: 10/12/14  3:41 PM  Result Value Ref Range   Sodium 135 135 - 145 mmol/L   Potassium 4.3 3.5 - 5.1 mmol/L   Chloride 101 101 - 111 mmol/L   CO2 27 22 - 32 mmol/L   Glucose, Bld 203 (H) 65 - 99 mg/dL   BUN 28 (H) 6 - 20 mg/dL   Creatinine, Ser UNABLE TO REPORT DUE TO ICTERUS 0.44 - 1.00 mg/dL   Calcium 9.3 8.9 - 10.3 mg/dL   Phosphorus UNABLE TO REPORT DUE TO INTERFERENCE FROM ICTERUS 2.5 - 4.6 mg/dL   Albumin 2.2 (L) 3.5 - 5.0 g/dL   GFR calc non Af Amer NOT CALCULATED >60 mL/min   GFR calc Af Amer NOT CALCULATED >60 mL/min   Anion gap 7 5 - 15  Glucose, capillary     Status: Abnormal  Collection Time: 10/12/14  4:11 PM  Result Value Ref Range   Glucose-Capillary 166 (H) 65 - 99 mg/dL  Culture, blood (routine x 2)     Status: None (Preliminary result)   Collection Time: 10/12/14  4:30 PM  Result Value Ref Range   Specimen Description BLOOD    Special Requests Normal    Culture NO GROWTH < 24 HOURS    Report Status PENDING   Glucose, capillary     Status: Abnormal   Collection Time: 10/12/14  5:11 PM  Result Value Ref Range   Glucose-Capillary 172 (H) 65 - 99 mg/dL  Glucose, capillary     Status: Abnormal   Collection Time: 10/12/14  6:19 PM  Result Value Ref Range   Glucose-Capillary 153 (H) 65 - 99 mg/dL  Glucose, capillary     Status: None   Collection Time: 10/12/14  7:20 PM  Result Value Ref Range   Glucose-Capillary 98 65 - 99 mg/dL  Glucose, capillary     Status: None   Collection Time: 10/12/14  8:22 PM  Result Value Ref Range   Glucose-Capillary 92 65 - 99 mg/dL  Glucose, capillary     Status: None   Collection Time: 10/12/14  9:27 PM  Result Value Ref Range   Glucose-Capillary 98 65 - 99 mg/dL  Glucose, capillary     Status: Abnormal   Collection Time: 10/12/14 10:29 PM  Result Value Ref Range   Glucose-Capillary 151 (H) 65 - 99 mg/dL  Renal function     Status: Abnormal   Collection Time: 10/12/14 11:05 PM  Result Value Ref Range    Sodium 137 135 - 145 mmol/L   Potassium 4.8 3.5 - 5.1 mmol/L   Chloride 103 101 - 111 mmol/L   CO2 28 22 - 32 mmol/L   Glucose, Bld 224 (H) 65 - 99 mg/dL   BUN 26 (H) 6 - 20 mg/dL   Creatinine, Ser UNABLE TO REPORT DUE TO ICTERUS 0.44 - 1.00 mg/dL   Calcium 9.0 8.9 - 10.3 mg/dL   Phosphorus UNABLE TO REPORT DUE TO ICTERUS 2.5 - 4.6 mg/dL   Albumin 2.1 (L) 3.5 - 5.0 g/dL   GFR calc non Af Amer NOT CALCULATED >60 mL/min   GFR calc Af Amer NOT CALCULATED >60 mL/min   Anion gap 6 5 - 15  Glucose, capillary     Status: Abnormal   Collection Time: 10/12/14 11:32 PM  Result Value Ref Range   Glucose-Capillary 209 (H) 65 - 99 mg/dL  Glucose, capillary     Status: Abnormal   Collection Time: 10/13/14 12:35 AM  Result Value Ref Range   Glucose-Capillary 194 (H) 65 - 99 mg/dL  Glucose, capillary     Status: Abnormal   Collection Time: 10/13/14  1:32 AM  Result Value Ref Range   Glucose-Capillary 235 (H) 65 - 99 mg/dL  Glucose, capillary     Status: Abnormal   Collection Time: 10/13/14  2:31 AM  Result Value Ref Range   Glucose-Capillary 220 (H) 65 - 99 mg/dL  Glucose, capillary     Status: Abnormal   Collection Time: 10/13/14  3:35 AM  Result Value Ref Range   Glucose-Capillary 176 (H) 65 - 99 mg/dL  Magnesium     Status: None   Collection Time: 10/13/14  4:39 AM  Result Value Ref Range   Magnesium 1.7 1.7 - 2.4 mg/dL  APTT     Status: Abnormal   Collection Time: 10/13/14  4:39 AM  Result Value Ref Range   aPTT 45 (H) 24 - 36 seconds  CBC     Status: Abnormal   Collection Time: 10/13/14  4:39 AM  Result Value Ref Range   WBC 20.6 (H) 3.6 - 11.0 K/uL   RBC 3.18 (L) 3.80 - 5.20 MIL/uL   Hemoglobin 9.8 (L) 12.0 - 16.0 g/dL   HCT 29.8 (L) 35.0 - 47.0 %   MCV 93.6 80.0 - 100.0 fL   MCH 30.8 26.0 - 34.0 pg   MCHC 33.0 32.0 - 36.0 g/dL   RDW 18.1 (H) 11.5 - 14.5 %   Platelets 91 (L) 150 - 440 K/uL  Comprehensive metabolic panel     Status: Abnormal   Collection Time: 10/13/14   4:39 AM  Result Value Ref Range   Sodium 138 135 - 145 mmol/L   Potassium 4.6 3.5 - 5.1 mmol/L   Chloride 103 101 - 111 mmol/L   CO2 27 22 - 32 mmol/L   Glucose, Bld 151 (H) 65 - 99 mg/dL   BUN 27 (H) 6 - 20 mg/dL   Creatinine, Ser NOT VALID 0.44 - 1.00 mg/dL   Calcium 9.3 8.9 - 10.3 mg/dL   Total Protein 6.8 6.5 - 8.1 g/dL   Albumin 2.3 (L) 3.5 - 5.0 g/dL   AST 301 (H) 15 - 41 U/L   ALT 87 (H) 14 - 54 U/L   Alkaline Phosphatase 1361 (H) 38 - 126 U/L   Total Bilirubin 32.7 (HH) 0.3 - 1.2 mg/dL   GFR calc non Af Amer NOT CALCULATED >60 mL/min   GFR calc Af Amer NOT CALCULATED >60 mL/min   Anion gap 8 5 - 15  Protime-INR     Status: Abnormal   Collection Time: 10/13/14  4:39 AM  Result Value Ref Range   Prothrombin Time 23.1 (H) 11.4 - 15.0 seconds   INR 2.03   Glucose, capillary     Status: Abnormal   Collection Time: 10/13/14  4:39 AM  Result Value Ref Range   Glucose-Capillary 141 (H) 65 - 99 mg/dL  Glucose, capillary     Status: Abnormal   Collection Time: 10/13/14  5:33 AM  Result Value Ref Range   Glucose-Capillary 137 (H) 65 - 99 mg/dL  Glucose, capillary     Status: Abnormal   Collection Time: 10/13/14  6:33 AM  Result Value Ref Range   Glucose-Capillary 120 (H) 65 - 99 mg/dL  Glucose, capillary     Status: Abnormal   Collection Time: 10/13/14  7:36 AM  Result Value Ref Range   Glucose-Capillary 142 (H) 65 - 99 mg/dL  Glucose, capillary     Status: Abnormal   Collection Time: 10/13/14  8:34 AM  Result Value Ref Range   Glucose-Capillary 159 (H) 65 - 99 mg/dL  Glucose, capillary     Status: Abnormal   Collection Time: 10/13/14  9:38 AM  Result Value Ref Range   Glucose-Capillary 172 (H) 65 - 99 mg/dL  Glucose, capillary     Status: Abnormal   Collection Time: 10/13/14 10:30 AM  Result Value Ref Range   Glucose-Capillary 168 (H) 65 - 99 mg/dL  Glucose, capillary     Status: Abnormal   Collection Time: 10/13/14 11:04 AM  Result Value Ref Range    Glucose-Capillary 163 (H) 65 - 99 mg/dL  Glucose, capillary     Status: Abnormal   Collection Time: 10/13/14 12:22 PM  Result Value Ref Range   Glucose-Capillary 198 (H) 65 -  99 mg/dL     Assessment/Plan:   PULMONARY -wean assessment performed she has failed attempts at SBT -will continue with PRVC at this time -titrate FiO2 as tolerated -will titrate PEEP as tolerated -MDI as ordered   CARDIO -still requiring pressors at this time -will titrate to keep MAP>65  RENAL -on CRRT presently renal is following  ID -will continue with antibiotics presently -cultures growing streptococcus  GI -on Pantoprazole  ENDO -Monitor FSBS -continue with insulin   LOS: 15 days    Critical Care Total Time: 35MIN    *Care during the described time interval was provided by me and/or other providers on the critical care team.  I have reviewed this patient's available data, including medical history, events of note, physical examination and test results as part of my evaluation.

## 2014-10-13 NOTE — Progress Notes (Addendum)
Inpatient Diabetes Program Recommendations  AACE/ADA: New Consensus Statement on Inpatient Glycemic Control (2013)  Target Ranges:  Prepandial:   less than 140 mg/dL      Peak postprandial:   less than 180 mg/dL (1-2 hours)      Critically ill patients:  140 - 180 mg/dL    Results for [name], [name] (MRN [id number]) as of 10/13/2014 07:59  Ref. Range 10/13/2014 02:31 10/13/2014 03:35 10/13/2014 04:39 10/13/2014 05:33 10/13/2014 06:33  Glucose-Capillary Latest Ref Range: 65-99 mg/dL 220 (H) 176 (H) 141 (H) 137 (H) 120 (H)     **Patient has been receiving an IV insulin drip since 11 am on 06/03.  **Note that the IV Insulin drip is the most effective and the safest insulin we can administer to this critically ill patient at this time.    When the patient meets the following criteria, please transition off the IV insulin drip per the ICU Glycemic Control protocol guidelines- Patient's IV insulin drip rate is less than 4 units/hour Patient has had 6 consecutive glucose readings less than 180 mg/dl  Patient's TPN rate is at goal and stable   MD- If decision made to transition patient off the IV insulin drip to SQ insulin, please use Phase 3 Transition Orders for the ICU Glycemic Control Protocol.  Recommend the following:  1. Start Lantus 15 units daily (~0.2 units/kg dosing)- Give Lantus at least 1-2 hours before the IV insulin drip is stopped  2. Start Novolog Sensitive correction scale 1-2-3 units Q4 hours per the ICU Glycemic Control Protocol order set, and give the first dose of Novolog at the time the infusion is stopped  3. Start TPN coverage as well to cover the glucose in the TPN solution- Recommend Novolog 2 units Q4 hours to start   Gentry Fitz, RN, [place], [place], CDE Diabetes Coordinator Inpatient Diabetes Program  [phone] (Team Pager) [phone] ([place]) 10/13/2014 8:04 AM

## 2014-10-13 NOTE — Progress Notes (Signed)
Nutrition Follow-up    INTERVENTION:   (TPN): recommend continuing TPN at current rate  NUTRITION DIAGNOSIS:  Inadequate oral intake related to inability to eat as evidenced by NPO status. Being addressed via TPN   GOAL:   (PN): tolerance    MONITOR:   (PN, EN, Electrolyte and renal profile, Anthropometric, Digestive system )   ASSESSMENT:  Pt remains on vent, on CRRT  PN: tolerating 5%AA/15%Dextrose at rate of 70 ml/hr, 20% Lipids infusing today  Digestive System: OG clamped yesterday, 300 mL emesis per documentation, OG back to suction  Electrolyte and Renal Profile:  Recent Labs Lab 10/11/14 0555  10/12/14 0444 10/12/14 0752 10/12/14 1541 10/12/14 2305 10/13/14 0439  BUN 32*  < > 28* 28* 28* 26* 27*  CREATININE SEE COMMENTS  < > SEE COMMENTS SEE COMMENTS UNABLE TO REPORT DUE TO ICTERUS UNABLE TO REPORT DUE TO ICTERUS NOT VALID  NA 137  < > 138 136 135 137 138  K 3.5  < > 4.3 4.3 4.3 4.8 4.6  MG 1.7  --  1.7  --   --   --  1.7  PHOS  --   < >  --  SEE COMMENTS UNABLE TO REPORT DUE TO INTERFERENCE FROM ICTERUS UNABLE TO REPORT DUE TO ICTERUS  --   < > = values in this interval not displayed.  Glucose Profile:  Recent Labs  10/13/14 0938 10/13/14 1030 10/13/14 1104  GLUCAP 172* 168* 163*   Protein Profile:  Recent Labs Lab 10/12/14 1541 10/12/14 2305 10/13/14 0439  ALBUMIN 2.2* 2.1* 2.3*   Meds: reviewed   Height:  Ht Readings from Last 1 Encounters:  09/27/2014 5\' 1"  (1.549 m)    Weight:  Wt Readings from Last 1 Encounters:  10/13/14 164 lb (74.39 kg)      Filed Weights   10/11/14 0500 10/12/14 0243 10/13/14 0200  Weight: 184 lb 15.5 oz (83.9 kg) 168 lb 6.9 oz (76.4 kg) 164 lb (74.39 kg)    BMI:  Body mass index is 31 kg/(m^2).  Estimated Nutritional Needs:  Kcal:  (11-14 kcals/d) Using actual wt of 95kg) 1050-1330 kcals/d  Protein:  (2.0-2.5 g/d) Using IbW of 48kg 96-120 g/d  Fluid:  (25-63ml/kg) Using IBW of 48kg  1200-1429ml/d  Skin: stage II pressure ulcer on buttock  Diet Order:  Diet NPO time specified .TPN (CLINIMIX-E) Adult  HIGH Care Level  Kerman Passey MS, RD, LDN [phone] Pager

## 2014-10-13 NOTE — Clinical Documentation Improvement (Addendum)
       MD's, NP's, and PA's  Per staff nurse documentation patient has "stage 2 pressure ulcer of  Buttocks" [place]  nurse confirmed pressure ulcer, please document stage and location in notes if appropriate for this admission.  Thank you   Possible Clinical Conditions?     Yes, the condition is present on admission at the time of the order to admit patient to inpatient status  No, the condition is not present on admission and developed during the inpatient stay  Clinically, unable to determine if the condition was present on admission  Other (please specify)   Unable to determine  Unknown     Thank You,  Ree Kida ,RN Clinical Documentation Specialist:  [facility]

## 2014-10-13 NOTE — Consult Note (Signed)
WOC wound consult note Reason for Consult:Ruptured blister to right lateral thigh. Was present on admission as a large serum and blood filled blister.  Subsequent rupture of this blister and topical therapy indicated at this time.  Wound type: Cellulitis to right thigh, resolving and ruptured blister present.   Stage II to right buttocks 4 cm x 3 cm x 0.1 cm Pressure Ulcer POA: Yes Measurement: Right lateral thigh  18 cm x 12 cm nonintact lesion.  Epithelium has sloughed off.  There is a 5 cm x 5 cm central area of deeper injury.  Wound has moderate serous weeping.  Silicone border foam currently in place.  IS effectively wicking the drainage, but will switch to topical treatment that will encourage moist healing and manage exudate.   Continue Allevyn silicone border foam dressing to buttocks.   Drainage (amount, consistency, odor) Moderate serous weeping No odor.   Periwound: Intact Dressing procedure/placement/frequency: Cleanse right lateral thigh with NS and pat gently dry.  Apply Xeroform gauze to wound bed. Cover with ABD pad and secure with tape.  Change daily.   Cleanse wound to buttocks with NS and pat gently dry.  Apply Allevyn silicone border foam. Change every 3 days and PRN soilage.   Will not follow at this time.  Please re-consult if needed.  Domenic Moras RN BSN [place] Pager [phone]

## 2014-10-13 NOTE — Progress Notes (Signed)
GI Note:  Still with multi organ failure.  INR stable at 2. T.bili rising.    Will consider PEG for early next week, but will be high risk given coagulopathy, severe esopahgitis,lhemodynamic instability still on pressors.  Will wait to ensure trach is possible before going ahead with PEG tube.

## 2014-10-13 NOTE — Progress Notes (Signed)
Subjective:  Remains critically ill. Still on CRRT. BFR 250 DFR 2.5 UF 200 cc/hr  NG placed to suction UOP anuric TPN 70 cc/hr Norepinephrine and vasopressin FiO2 35%  Objective:  Vital signs in last 24 hours:  Temp:  [97.4 F (36.3 C)-98 F (36.7 C)] 97.6 F (36.4 C) (06/09 0700) Pulse Rate:  [85-97] 90 (06/09 0800) Resp:  [11-22] 16 (06/09 0800) BP: (83-108)/(51-80) 96/63 mmHg (06/09 0800) SpO2:  [92 %-100 %] 99 % (06/09 0813) FiO2 (%):  [35 %] 35 % (06/09 0813) Weight:  [74.39 kg (164 lb)] 74.39 kg (164 lb) (06/09 0200)  Weight change: -2.01 kg (-4 lb 6.9 oz) Filed Weights   10/11/14 0500 10/12/14 0243 10/13/14 0200  Weight: 83.9 kg (184 lb 15.5 oz) 76.4 kg (168 lb 6.9 oz) 74.39 kg (164 lb)    Intake/Output: I/O last 3 completed shifts: In: 5604.9 [I.V.:1464.9; Other:60; NG/GT:40; IV Piggyback:400] Out: 7124 [Urine:70; Emesis/NG output:500; PYKDX:8338; Stool:2]     Physical Exam: General: Critically ill appearing  HEENT Scleral icterus noted, ETT in place, NGT to suction  Neck supple  Pulm/lungs Decreased breath sounds at bases, vent assisted: PRVC FiO2 35%  CVS/Heart S1S2 no rubs  Abdomen:  distended, tense, decreased Bowel sounds  Extremities: 3+ anasarca  Neurologic: On the vent  Skin: Cools extremities  Access: Temp cath- rt femoral 5/25 Dr. Delana Meyer       Basic Metabolic Panel:  Recent Labs Lab 10/09/14 2505  10/10/14 3976  10/11/14 7341 10/11/14 9379 10/11/14 1501  10/12/14 0240 10/12/14 9735 10/12/14 1541 10/12/14 2305 10/13/14 0439  NA 136  < > 132*  134*  < > 137 136 137  < > 138 136 135 137 138  K 4.6  < > 4.5  4.5  < > 3.5 3.5 3.7  < > 4.3 4.3 4.3 4.8 4.6  CL 104  < > 102  103  < > 104 102 103  < > 102 104 101 103 103  CO2 26  < > 24  24  < > 29 27 26   < > 28 27 27 28 27   GLUCOSE 178*  < > 224*  198*  < > 147* 125* 216*  < > 140* 167* 203* 224* 151*  BUN 47*  < > 53*  53*  < > 32* 32* 31*  < > 28* 28* 28* 26* 27*  CREATININE  SEE COMMENTS  < > UNABLE TO REPORT DUE TO ICTERUS  SEE COMMENTS  < > SEE COMMENTS SEE COMMENTS SEE COMMENTS  < > SEE COMMENTS SEE COMMENTS UNABLE TO REPORT DUE TO ICTERUS UNABLE TO REPORT DUE TO ICTERUS NOT VALID  CALCIUM 9.1  < > 8.7*  9.0  < > 9.1 8.9 9.2  < > 9.6 9.2 9.3 9.0 9.3  MG 2.1  --  1.8  --  1.7  --   --   --  1.7  --   --   --  1.7  PHOS  --   < > SEE COMMENTS  UNABLE TO REPORT DUE TO ICTERUS  < >  --  SEE COMMENTS SEE COMMENTS  --   --  SEE COMMENTS UNABLE TO REPORT DUE TO INTERFERENCE FROM ICTERUS UNABLE TO REPORT DUE TO ICTERUS  --   < > = values in this interval not displayed.   CBC:  Recent Labs Lab 10/09/14 0428 10/10/14 0533 10/11/14 0555 10/12/14 0444 10/13/14 0439  WBC 25.8* 17.6* 17.3* 20.8* 20.6*  HGB 9.1* 8.9*  9.5* 9.4* 9.8*  HCT 27.4* 26.5* 27.7* 27.9* 29.8*  MCV 91.9 91.4 91.3 91.4 93.6  PLT 72* 73* 86* 91* 91*      Microbiology: Results for orders placed or performed during the hospital encounter of 09/06/2014  Blood culture (routine x 2)     Status: None   Collection Time: 09/30/2014 12:45 AM  Result Value Ref Range Status   Specimen Description BLOOD  Final   Special Requests NONE  Final   Culture  Setup Time   Final    GRAM POSITIVE COCCI IN CHAINS IN BOTH AEROBIC AND ANAEROBIC BOTTLES CRITICAL RESULT CALLED TO, READ BACK BY AND VERIFIED WITH: PAM CRAWFORD AT 1406 10/03/2014 DV    Culture STREPTOCOCCUS SPECIES  Final   Report Status 10/01/2014 FINAL  Final   Organism ID, Bacteria STREPTOCOCCUS SPECIES  Final      Susceptibility   Streptococcus species - MIC (ETEST)*    ERYTHROMYCIN Value in next row Resistant      RESISTANT6    PENICILLIN Value in next row Intermediate      INTERMEDIATE0.25    CEFTRIAXONE Value in next row Sensitive      SENSITIVE0.047    VANCOMYCIN Value in next row Sensitive      SENSITIVE1.0    LEVOFLOXACIN Value in next row Sensitive      SENSITIVE1.0    * STREPTOCOCCUS SPECIES  Blood culture (routine x 2)      Status: None   Collection Time: 10/03/2014 12:45 AM  Result Value Ref Range Status   Specimen Description BLOOD  Final   Special Requests NONE  Final   Culture  Setup Time   Final    GRAM POSITIVE COCCI IN CHAINS IN BOTH AEROBIC AND ANAEROBIC BOTTLES CRITICAL RESULT CALLED TO, READ BACK BY AND VERIFIED WITH: PAM CRAWFORD AT 1406 09/12/2014 DV    Culture   Final    STREPTOCOCCUS SPECIES REFER TO ACCESSION [id number] COLLECTED ON 09/04/2014 AT 0045 FOR SENSITIVITIES    Report Status 09/30/2014 FINAL  Final  Culture, blood (routine x 2)     Status: None   Collection Time: 10/04/14 11:50 AM  Result Value Ref Range Status   Specimen Description BLOOD  Final   Special Requests NONE  Final   Culture NO GROWTH 5 DAYS  Final   Report Status 10/09/2014 FINAL  Final  Culture, blood (routine x 2)     Status: None   Collection Time: 10/04/14 12:00 PM  Result Value Ref Range Status   Specimen Description BLOOD  Final   Special Requests NONE  Final   Culture NO GROWTH 5 DAYS  Final   Report Status 10/09/2014 FINAL  Final  Culture, respiratory (NON-Expectorated)     Status: None   Collection Time: 10/04/14  2:00 PM  Result Value Ref Range Status   Specimen Description INDUCED SPUTUM  Final   Special Requests Normal  Final   Gram Stain   Final    EXCELLENT SPECIMEN - 90-100% WBCS MANY WBC SEEN MANY YEAST    Culture MODERATE GROWTH CANDIDA ALBICANS  Final   Report Status 10/07/2014 FINAL  Final  Culture, blood (routine x 2)     Status: None (Preliminary result)   Collection Time: 10/12/14  1:34 PM  Result Value Ref Range Status   Specimen Description BLOOD  Final   Special Requests Normal  Final   Culture NO GROWTH < 24 HOURS  Final   Report Status PENDING  Incomplete  Culture,  blood (routine x 2)     Status: None (Preliminary result)   Collection Time: 10/12/14  4:30 PM  Result Value Ref Range Status   Specimen Description BLOOD  Final   Special Requests Normal  Final   Culture NO GROWTH < 24  HOURS  Final   Report Status PENDING  Incomplete    Coagulation Studies:  Recent Labs  10/12/14 0445 10/13/14 0439  LABPROT 23.6* 23.1*  INR 2.09 2.03    Urinalysis: No results for input(s): COLORURINE, LABSPEC, PHURINE, GLUCOSEU, HGBUR, BILIRUBINUR, KETONESUR, PROTEINUR, UROBILINOGEN, NITRITE, LEUKOCYTESUR in the last 72 hours.  Invalid input(s): APPERANCEUR    Imaging: No results found.   Medications:   . Mar[place] KitchenTPN (CLINIMIX-E) Adult 70 mL/hr at 10/13/14 0600  . fat emulsion    . fentaNYL infusion INTRAVENOUS 10 mcg/hr (10/13/14 0851)  . insulin (NOVOLIN-R) infusion 2 Units/hr (10/13/14 0076)  . norepinephrine (LEVOPHED) Adult infusion 16 mcg/min (10/13/14 0600)  . pureflow 2,000 mL/hr at 10/13/14 0830  . vasopressin (PITRESSIN) infusion - *FOR SHOCK* 0.03 Units/min (10/13/14 0600)   . antiseptic oral rinse  7 mL Mouth Rinse QID  . budesonide (PULMICORT) nebulizer solution  0.5 mg Nebulization BID  . chlorhexidine  15 mL Mouth Rinse BID  . erythromycin  250 mg Intravenous 3 times per day  . ipratropium-albuterol  3 mL Nebulization Q4H  . lactulose  20 g Oral BID  . levofloxacin (LEVAQUIN) IV  500 mg Intravenous Q24H  . midodrine  10 mg Oral TID  . pantoprazole (PROTONIX) IV  40 mg Intravenous Q12H  . sodium chloride  10-40 mL Intracatheter Q12H  . sucralfate  1 g Oral 4 times per day   acetaminophen **OR** acetaminophen, albuterol, heparin, polyethylene glycol powder, sodium chloride  Assessment/ Plan:   64 y.o. African [place] female with diabetes mellitus type 2, hypertension, morbid obesity, osteoarthritis, status post right knee surgery, Amyloidosis with nephrotic syndrome with edema, hypoalbuminemia, renal insufficiency and proteinuria.  1. ARF with CKD st 3 . Baseline cr 1.3. (08/2014)  2. Nephrotic Syndrome with proteinuria: Renal Amyloidosis.  3. Generalized Edema 4. Hypoalbuminemia 5. Right groin cellulitis /sepsis- strep species  6. Diabetes Mellitus  type II 7. Hypotension 8. Acute Respiratory Failure with mechanical ventilation  Plan: Patient remains essentially anuric at this point in time. She has tolerated CRRT well. Continue vasopressors. Hemodynamically unstable.  Overall prognosis quite poor.  LOS: [place], [place] 6/9/20169:32 AM

## 2014-10-13 NOTE — Care Management Note (Signed)
Dr. Candiss Norse and Dr. Juleen China have both asked that I continue to monitor this patient for possible need for out patient dialysis placement. [name]  [phone]

## 2014-10-13 NOTE — Progress Notes (Signed)
Date of Admission:  10/04/2014     ID: [name] is a 64 y.o. female with  [name] and anasarca admitted with sepsis from cellulitis on thigh.  Moore with strep species  Active Problems:   Acute renal failure   Amyloidosis   Septic shock   Leaking central line catheter   Acute respiratory failure with hypoxia   Acute liver failure   Pressure ulcer  Subjective:  Remains on CRRT, intubated, on one pressors.  Daughter in law at bedside.  Medications:  . antiseptic oral rinse  7 mL Mouth Rinse QID  . budesonide (PULMICORT) nebulizer solution  0.5 mg Nebulization BID  . chlorhexidine  15 mL Mouth Rinse BID  . erythromycin  250 mg Intravenous 3 times per day  . ipratropium-albuterol  3 mL Nebulization Q4H  . lactulose  20 g Oral BID  . levofloxacin (LEVAQUIN) IV  500 mg Intravenous Q24H  . magnesium sulfate 1 - 4 g bolus IVPB  2 g Intravenous Once  . midodrine  10 mg Oral TID  . pantoprazole (PROTONIX) IV  40 mg Intravenous Q12H  . sodium chloride  10-40 mL Intracatheter Q12H  . sucralfate  1 g Oral 4 times per day    Objective: Vital signs in last 24 hours: Temp:  [97.4 F (36.3 C)-98.1 F (36.7 C)] 98.1 F (36.7 C) (06/09 1100) Pulse Rate:  [85-94] 89 (06/09 1300) Resp:  [11-22] 18 (06/09 1300) BP: (83-100)/(51-80) 94/56 mmHg (06/09 1300) SpO2:  [96 %-100 %] 100 % (06/09 1300) FiO2 (%):  [35 %] 35 % (06/09 1207) Weight:  [74.39 kg (164 lb)] 74.39 kg (164 lb) (06/09 0200)  GENERAL: intubated, critically ill appearing EYES: Pupils equal, round, reactive to light and accommodation. No scleral icterus. Extraocular muscles intact. HEENT: Head atraumatic, normocephalic. Oropharynx and nasopharynx clear. NECK: Supple, no jugular venous distention. No thyroid enlargement, no tenderness. Right IJ TL. LUNGS: Normal breath sounds bilaterally, no wheezing, rales, rhonchi. No use of accessory muscles of respiration. CARDIOVASCULAR: tachy  S1, S2 normal. 2/6 sm ABDOMEN:  Soft, nontender, nondistended. Bowel sounds present. No organomegaly or mass. EXTREMITIES: No cyanosis, clubbing. Anasarca. Superficial ulcer right lateral thigh.  NEUROLOGIC: sedated PSYCHIATRIC: The patient is sedated SKIN: decreased erythema over thigh , denuded bullae covered, smaller bullae with clear fluid  Lab Results  Recent Labs  10/12/14 0444  10/12/14 2305 10/13/14 0439  WBC 20.8*  --   --  20.6*  HGB 9.4*  --   --  9.8*  HCT 27.9*  --   --  29.8*  NA 138  < > 137 138  K 4.3  < > 4.8 4.6  CL 102  < > 103 103  CO2 28  < > 28 27  BUN 28*  < > 26* 27*  CREATININE SEE COMMENTS  < > UNABLE TO REPORT DUE TO ICTERUS NOT VALID  < > = values in this interval not displayed. Liver Panel  Recent Labs  10/12/14 0444  10/12/14 2305 10/13/14 0439  PROT 6.8  --   --  6.8  ALBUMIN 2.3*  < > 2.1* 2.3*  AST 208*  --   --  301*  ALT 62*  --   --  87*  ALKPHOS 1227*  --   --  1361*  BILITOT 35.9*  --   --  32.7*  < > = values in this interval not displayed.  Microbiology: Results for orders placed or performed during the hospital encounter of 09/29/2014  Blood culture (routine x 2)     Status: None   Collection Time: 09/29/2014 12:45 AM  Result Value Ref Range Status   Specimen Description BLOOD  Final   Special Requests NONE  Final   Culture  Setup Time   Final    GRAM POSITIVE COCCI IN CHAINS IN BOTH AEROBIC AND ANAEROBIC BOTTLES CRITICAL RESULT CALLED TO, READ BACK BY AND VERIFIED WITH: PAM CRAWFORD AT 1406 09/26/2014 DV    Culture STREPTOCOCCUS SPECIES  Final   Report Status 10/01/2014 FINAL  Final   Organism ID, Bacteria STREPTOCOCCUS SPECIES  Final      Susceptibility   Streptococcus species - MIC (ETEST)*    ERYTHROMYCIN Value in next row Resistant      RESISTANT6    PENICILLIN Value in next row Intermediate      INTERMEDIATE0.25    CEFTRIAXONE Value in next row Sensitive      SENSITIVE0.047    VANCOMYCIN Value in next row Sensitive      SENSITIVE1.0     LEVOFLOXACIN Value in next row Sensitive      SENSITIVE1.0    * STREPTOCOCCUS SPECIES  Blood culture (routine x 2)     Status: None   Collection Time: 09/30/2014 12:45 AM  Result Value Ref Range Status   Specimen Description BLOOD  Final   Special Requests NONE  Final   Culture  Setup Time   Final    GRAM POSITIVE COCCI IN CHAINS IN BOTH AEROBIC AND ANAEROBIC BOTTLES CRITICAL RESULT CALLED TO, READ BACK BY AND VERIFIED WITH: PAM CRAWFORD AT 1406 09/05/2014 DV    Culture   Final    STREPTOCOCCUS SPECIES REFER TO ACCESSION [id number] COLLECTED ON 10/02/2014 AT 0045 FOR SENSITIVITIES    Report Status 09/30/2014 FINAL  Final  Culture, blood (routine x 2)     Status: None   Collection Time: 10/04/14 11:50 AM  Result Value Ref Range Status   Specimen Description BLOOD  Final   Special Requests NONE  Final   Culture NO GROWTH 5 DAYS  Final   Report Status 10/09/2014 FINAL  Final  Culture, blood (routine x 2)     Status: None   Collection Time: 10/04/14 12:00 PM  Result Value Ref Range Status   Specimen Description BLOOD  Final   Special Requests NONE  Final   Culture NO GROWTH 5 DAYS  Final   Report Status 10/09/2014 FINAL  Final  Culture, respiratory (NON-Expectorated)     Status: None   Collection Time: 10/04/14  2:00 PM  Result Value Ref Range Status   Specimen Description INDUCED SPUTUM  Final   Special Requests Normal  Final   Gram Stain   Final    EXCELLENT SPECIMEN - 90-100% WBCS MANY WBC SEEN MANY YEAST    Culture MODERATE GROWTH CANDIDA ALBICANS  Final   Report Status 10/07/2014 FINAL  Final  Culture, blood (routine x 2)     Status: None (Preliminary result)   Collection Time: 10/12/14  1:34 PM  Result Value Ref Range Status   Specimen Description BLOOD  Final   Special Requests Normal  Final   Culture NO GROWTH < 24 HOURS  Final   Report Status PENDING  Incomplete  Culture, blood (routine x 2)     Status: None (Preliminary result)   Collection Time: 10/12/14  4:30 PM   Result Value Ref Range Status   Specimen Description BLOOD  Final   Special Requests Normal  Final  Culture NO GROWTH < 24 HOURS  Final   Report Status PENDING  Incomplete    Studies/Results: Dg Abd Portable 1v  10/13/2014   CLINICAL DATA:  Ileus, sepsis and renal failure.  EXAM: PORTABLE ABDOMEN - 1 VIEW  COMPARISON:  10/03/2014  FINDINGS: Since a prior study there is improvement in ileus pattern with no significant gaseous distention of bowel identified. A nasogastric tube remains extending into the distal stomach. A right groin temporary dialysis catheter extends into the lower IVC.  IMPRESSION: Improvement in ileus with no significant dilated bowel loops identified.   Electronically Signed   By: Aletta Edouard M.D.   On: 10/13/2014 14:02   Echo 09/29/14 - Procedure narrative: Transthoracic echocardiography. Image quality was poor. No apical views. - Left ventricle: The cavity size was normal. There was moderate concentric hypertrophy. Systolic function was normal. The estimated ejection fraction was in the range of 60% to 65%. Wall motion was normal; there were no regional wall motion abnormalities. The study is not technically sufficient to allow evaluation of LV diastolic function. Impressions: - There was no evidence of a vegetation.  CT abd 5/28 IMPRESSION: 1. Bilateral pleural effusions, ascites and anasarca is identified consistent with fluid overload state. 2. Subcutaneous fat stranding and skin thickening may be related to anasarca or cellulitis. 3. No fluid collections identified. 4. Left lung opacities may reflect pneumonia and/r or areas of asymmetric edema.  Assessment/Plan: [name] is a 64 y.o. 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  Now also with multiorgan failure, elevated LFTS, possible aspiration pna.  Decreasing hemoglobin TTE negative.  CT abd pelvis did not show any significant  biliary pathology but possible sludge. Fevers resolved and wbc down. Off clinda, now on levo only  Recommendations Continue levofloxacin  - this will  cover the streptococcal bacteremia FU BCX to document clearance has been neg from 5/31 - would rec a 14 day total course of abx from first negative culture - stop date 6/14 Sputum cx with yeast but unlikely to need treatment Consider TEE to evaluate for endocarditis given that had likely  viridans strep bacteremia- if 5/31 cx turns positive will definitely need TEE but given elevated coags, low plts, severe esophagitis hesitant to do at this point  [facility], [place]   10/13/2014, 2:16 PM

## 2014-10-14 LAB — RENAL FUNCTION PANEL
ALBUMIN: 1.9 g/dL — AB (ref 3.5–5.0)
ALBUMIN: 1.8 g/dL — AB (ref 3.5–5.0)
ANION GAP: 5 (ref 5–15)
ANION GAP: UNDETERMINED (ref 5–15)
Albumin: 2.1 g/dL — ABNORMAL LOW (ref 3.5–5.0)
Anion gap: 5 (ref 5–15)
BUN: 23 mg/dL — AB (ref 6–20)
BUN: 25 mg/dL — AB (ref 6–20)
BUN: 29 mg/dL — AB (ref 6–20)
CALCIUM: 8.5 mg/dL — AB (ref 8.9–10.3)
CALCIUM: 8.6 mg/dL — AB (ref 8.9–10.3)
CO2: 26 mmol/L (ref 22–32)
CO2: 27 mmol/L (ref 22–32)
CO2: 26 mmol/L (ref 22–32)
Calcium: 9 mg/dL (ref 8.9–10.3)
Chloride: 104 mmol/L (ref 101–111)
Chloride: 103 mmol/L (ref 101–111)
Chloride: 104 mmol/L (ref 101–111)
GLUCOSE: 187 mg/dL — AB (ref 65–99)
Glucose, Bld: 191 mg/dL — ABNORMAL HIGH (ref 65–99)
Glucose, Bld: 170 mg/dL — ABNORMAL HIGH (ref 65–99)
POTASSIUM: 4 mmol/L (ref 3.5–5.1)
Potassium: 4.7 mmol/L (ref 3.5–5.1)
Potassium: 4.1 mmol/L (ref 3.5–5.1)
Sodium: 135 mmol/L (ref 135–145)
Sodium: 135 mmol/L (ref 135–145)

## 2014-10-14 LAB — GLUCOSE, CAPILLARY
GLUCOSE-CAPILLARY: 142 mg/dL — AB (ref 65–99)
GLUCOSE-CAPILLARY: 146 mg/dL — AB (ref 65–99)
GLUCOSE-CAPILLARY: 148 mg/dL — AB (ref 65–99)
GLUCOSE-CAPILLARY: 148 mg/dL — AB (ref 65–99)
GLUCOSE-CAPILLARY: 158 mg/dL — AB (ref 65–99)
GLUCOSE-CAPILLARY: 160 mg/dL — AB (ref 65–99)
GLUCOSE-CAPILLARY: 162 mg/dL — AB (ref 65–99)
GLUCOSE-CAPILLARY: 171 mg/dL — AB (ref 65–99)
Glucose-Capillary: 124 mg/dL — ABNORMAL HIGH (ref 65–99)
Glucose-Capillary: 137 mg/dL — ABNORMAL HIGH (ref 65–99)
Glucose-Capillary: 137 mg/dL — ABNORMAL HIGH (ref 65–99)
Glucose-Capillary: 145 mg/dL — ABNORMAL HIGH (ref 65–99)
Glucose-Capillary: 151 mg/dL — ABNORMAL HIGH (ref 65–99)
Glucose-Capillary: 151 mg/dL — ABNORMAL HIGH (ref 65–99)
Glucose-Capillary: 161 mg/dL — ABNORMAL HIGH (ref 65–99)
Glucose-Capillary: 163 mg/dL — ABNORMAL HIGH (ref 65–99)
Glucose-Capillary: 164 mg/dL — ABNORMAL HIGH (ref 65–99)
Glucose-Capillary: 167 mg/dL — ABNORMAL HIGH (ref 65–99)
Glucose-Capillary: 171 mg/dL — ABNORMAL HIGH (ref 65–99)
Glucose-Capillary: 182 mg/dL — ABNORMAL HIGH (ref 65–99)
Glucose-Capillary: 190 mg/dL — ABNORMAL HIGH (ref 65–99)
Glucose-Capillary: 219 mg/dL — ABNORMAL HIGH (ref 65–99)

## 2014-10-14 LAB — COMPREHENSIVE METABOLIC PANEL
ALBUMIN: 1.8 g/dL — AB (ref 3.5–5.0)
ALK PHOS: 1291 U/L — AB (ref 38–126)
ALT: 114 U/L — ABNORMAL HIGH (ref 14–54)
ALT: 107 U/L — ABNORMAL HIGH (ref 14–54)
ANION GAP: 4 — AB (ref 5–15)
ANION GAP: 6 (ref 5–15)
AST: 376 U/L — AB (ref 15–41)
AST: 362 U/L — AB (ref 15–41)
Albumin: 2.1 g/dL — ABNORMAL LOW (ref 3.5–5.0)
Alkaline Phosphatase: 1375 U/L — ABNORMAL HIGH (ref 38–126)
BILIRUBIN TOTAL: 32.8 mg/dL — AB (ref 0.3–1.2)
BILIRUBIN TOTAL: 27.7 mg/dL — AB (ref 0.3–1.2)
BUN: 24 mg/dL — ABNORMAL HIGH (ref 6–20)
BUN: 32 mg/dL — AB (ref 6–20)
CALCIUM: 8.5 mg/dL — AB (ref 8.9–10.3)
CHLORIDE: 105 mmol/L (ref 101–111)
CO2: 27 mmol/L (ref 22–32)
CO2: 25 mmol/L (ref 22–32)
Calcium: 8.8 mg/dL — ABNORMAL LOW (ref 8.9–10.3)
Chloride: 103 mmol/L (ref 101–111)
GLUCOSE: 150 mg/dL — AB (ref 65–99)
GLUCOSE: 220 mg/dL — AB (ref 65–99)
POTASSIUM: 5.1 mmol/L (ref 3.5–5.1)
Potassium: 4.4 mmol/L (ref 3.5–5.1)
SODIUM: 134 mmol/L — AB (ref 135–145)
Sodium: 136 mmol/L (ref 135–145)
TOTAL PROTEIN: 6.5 g/dL (ref 6.5–8.1)
TOTAL PROTEIN: 5.8 g/dL — AB (ref 6.5–8.1)

## 2014-10-14 LAB — CBC
HEMATOCRIT: 29.6 % — AB (ref 35.0–47.0)
Hemoglobin: 9.8 g/dL — ABNORMAL LOW (ref 12.0–16.0)
MCH: 31.7 pg (ref 26.0–34.0)
MCHC: 33.1 g/dL (ref 32.0–36.0)
MCV: 95.8 fL (ref 80.0–100.0)
Platelets: 71 10*3/uL — ABNORMAL LOW (ref 150–440)
RBC: 3.09 MIL/uL — ABNORMAL LOW (ref 3.80–5.20)
RDW: 18.8 % — AB (ref 11.5–14.5)
WBC: 21.5 10*3/uL — AB (ref 3.6–11.0)

## 2014-10-14 LAB — PROTIME-INR
INR: 2
Prothrombin Time: 22.8 seconds — ABNORMAL HIGH (ref 11.4–15.0)

## 2014-10-14 LAB — APTT: APTT: 50 s — AB (ref 24–36)

## 2014-10-14 LAB — MAGNESIUM: MAGNESIUM: 1.9 mg/dL (ref 1.7–2.4)

## 2014-10-14 MED ORDER — ALTEPLASE 100 MG IV SOLR
2.0000 mg | Freq: Once | INTRAVENOUS | Status: AC
Start: 2014-10-14 — End: 2014-10-14
  Administered 2014-10-14: 2 mg
  Filled 2014-10-14: qty 2

## 2014-10-14 MED ORDER — PUREFLOW DIALYSIS SOLUTION
INTRAVENOUS | Status: DC
  Administered 2014-10-14 – 2014-10-15 (×4): via INTRAVENOUS_CENTRAL

## 2014-10-14 MED ORDER — PUREFLOW DIALYSIS SOLUTION: INTRAVENOUS | Status: DC

## 2014-10-14 MED ORDER — INSULIN GLARGINE 100 UNIT/ML ~~LOC~~ SOLN
10.0000 [IU] | SUBCUTANEOUS | Status: DC
  Administered 2014-10-14: 10 [IU] via SUBCUTANEOUS
  Filled 2014-10-14: qty 0.1

## 2014-10-14 MED ORDER — INSULIN GLARGINE 100 UNIT/ML ~~LOC~~ SOLN
10.0000 [IU] | SUBCUTANEOUS | Status: DC
  Filled 2014-10-14 (×2): qty 0.1

## 2014-10-14 MED ORDER — INSULIN ASPART 100 UNIT/ML ~~LOC~~ SOLN
2.0000 [IU] | SUBCUTANEOUS | Status: DC
Start: 2014-10-14 — End: 2014-10-15
  Administered 2014-10-14: 4 [IU] via SUBCUTANEOUS
  Administered 2014-10-15: 6 [IU] via SUBCUTANEOUS
  Administered 2014-10-15: 4 [IU] via SUBCUTANEOUS
  Filled 2014-10-14: qty 4
  Filled 2014-10-14: qty 6
  Filled 2014-10-14: qty 4

## 2014-10-14 MED ORDER — TRACE MINERALS CR-CU-MN-SE-ZN 10-1000-500-60 MCG/ML IV SOLN
INTRAVENOUS | Status: AC
  Filled 2014-10-14 (×2): qty 1680

## 2014-10-14 MED ORDER — INSULIN ASPART 100 UNIT/ML ~~LOC~~ SOLN
1.0000 [IU] | SUBCUTANEOUS | Status: DC
Start: 2014-10-14 — End: 2014-10-14

## 2014-10-14 MED ORDER — STERILE WATER FOR INJECTION IJ SOLN
INTRAMUSCULAR | Status: AC
  Administered 2014-10-14: 10 mL
  Filled 2014-10-14: qty 10

## 2014-10-14 NOTE — Plan of Care (Signed)
Problem: Phase I Progression Outcomes Goal: Patient tolerating nututrition at goal Outcome: Not Progressing TPN running as patient could not tolerate tube feedings Goal: Patient tolerating weaning plan Outcome: Not Progressing Plan for tracheostomy on monday

## 2014-10-14 NOTE — Progress Notes (Signed)
Dialysis catheter with no blood return out of blue port. RN had notified Renal MD who notified Vascular MD. When Vascular MD assess, he gave order for one time tPA of port and if that did not work to call him back. After unsuccessful tPA attempt, RN notified Vascular MD. MD to come and re wire dialysis catheter when done with current procedure. RN notified daughter-in-law and through her son of patient.

## 2014-10-14 NOTE — Progress Notes (Signed)
Nutrition Follow-up    INTERVENTION:   (TPN): recommend continuing current TPN  EN: noted improvement in ileus per abdominal xray 6/9, +BM but pt requiring increase in levophed dose since night shift per Judson Roch RN (now at 24 mcg/min), Hgb stable; recommend trial of EN once pt stable on vasopressors  NUTRITION DIAGNOSIS:  Inadequate oral intake related to inability to eat as evidenced by NPO status. Being addressed via TPN   GOAL:   (PN): tolerance  EN: initiation of trophic feedings with clinically feasible  MONITOR:   (PN, EN, Electrolyte and renal profile, Anthropometric, Digestive system )  ASSESSMENT:  Pt remains sedated on vent, on CRRT. Noted plan for trach on Monday, possible PEG sometime early next week.   PN: TPN at 70 ml/hr  Digestive System: +BM today per Judson Roch RN, abdominal xray showing improvement in ileus (6/9)  Electrolyte and Renal Profile:  Recent Labs Lab 10/12/14 0444  10/13/14 0439  10/14/14 0017 10/14/14 0443 10/14/14 0836  BUN 28*  < > 27*  < > 23* 24* 25*  CREATININE SEE COMMENTS  < > NOT VALID  < > SEE COMMENTS NOT VALID SEE COMMENTS  NA 138  < > 138  < > SEE COMMENTS 136 135  K 4.3  < > 4.6  < > 4.7 5.1 4.1  MG 1.7  --  1.7  --   --  1.9  --   PHOS  --   < > SEE COMMENTS  --  SEE COMMENTS  --  SEE COMMENTS  < > = values in this interval not displayed.   Glucose Profile:  Recent Labs  10/14/14 1028 10/14/14 1127 10/14/14 1228  GLUCAP 163* 151* 190*   Nutritional Anemia Profile:  CBC Latest Ref Rng 10/14/2014 10/13/2014 10/12/2014  WBC 3.6 - 11.0 K/uL 21.5(H) 20.6(H) 20.8(H)  Hemoglobin 12.0 - 16.0 g/dL 9.8(L) 9.8(L) 9.4(L)  Hematocrit 35.0 - 47.0 % 29.6(L) 29.8(L) 27.9(L)  Platelets 150 - 440 K/uL 71(L) 91(L) 91(L)    Meds: vasopressin, levophed, erythromycin, lactulose  Height:  Ht Readings from Last 1 Encounters:  09/07/2014 5\' 1"  (1.549 m)    Weight:  Wt Readings from Last 1 Encounters:  10/14/14 173 lb 15.1 oz (78.9 kg)     Filed Weights   10/12/14 0243 10/13/14 0200 10/14/14 0200  Weight: 168 lb 6.9 oz (76.4 kg) 164 lb (74.39 kg) 173 lb 15.1 oz (78.9 kg)     BMI:  Body mass index is 32.88 kg/(m^2).  Estimated Nutritional Needs:  Kcal:  (11-14 kcals/d) Using actual wt of 95kg) 1050-1330 kcals/d  Protein:  (2.0-2.5 g/d) Using IbW of 48kg 96-120 g/d  Fluid:  (25-66ml/kg) Using IBW of 48kg 1200-1480ml/d  Skin:   (stage II pressure ulcer on buttock)  Diet Order:  .TPN (CLINIMIX-E) Adult Diet NPO time specified   [employer], RD, LDN [phone] Pager

## 2014-10-14 NOTE — Op Note (Signed)
  OPERATIVE NOTE   PROCEDURE: 1. Removal and replacement of a 30 cm double lumen dialysis catheter right femoral vein  PRE-OPERATIVE DIAGNOSIS: 1. Acute renal failure 2. Vent dependence 3.  Non-functional temporary dialysis catheter  POST-OPERATIVE DIAGNOSIS: Same  SURGEON: Leotis Pain, MD  ASSISTANT(S): None  ANESTHESIA: local  ESTIMATED BLOOD LOSS: Minimal   FINDING(S): 1. None  SPECIMEN(S): None  INDICATIONS:  Patient is a 64 yo AAF who presents with  Renal failure and failure to wean from the vent.  Her right femoral dialysis catheter is 63-89 weeks old and not working for CRRT.  This needs replaced as she still needs CRRT.  Risks and benefits were discussed, and informed consent was obtained..  DESCRIPTION: After obtaining full informed written consent, the patient was laid flat in the bed. The existing catheter and right groin were sterilly prepped and draped.  A 0.035 J wire was placed through the old line, and the old line removed.  Then, over this wire, a new 30 cm Duoglide dialysis catheter was placed over the wire and the wire was removed. The lumens withdrew dark red nonpulsatile blood and flushed easily with sterile saline. The catheter was secured to the skin with 3 nylon sutures. Sterile dressing was placed.  COMPLICATIONS: None  CONDITION: Stable  Ayriana Wix 10/14/2014 2:33 PM

## 2014-10-14 NOTE — Progress Notes (Signed)
Subjective:  Remains critically ill. Still on CRRT. BFR only at 140 this morning. UF of 4.3 litres UOP 5 On midodrine 10mg  tid Norepinephrine and vasopressin gtt TPN K 5.1 on 2K bath NG placed to suction On erythromycin and levofloxacin  Objective:  Vital signs in last 24 hours:  Temp:  [98 F (36.7 C)-98.1 F (36.7 C)] 98 F (36.7 C) (06/09 2007) Pulse Rate:  [87-95] 95 (06/10 0730) Resp:  [12-25] 25 (06/10 0730) BP: (74-96)/(41-67) 96/66 mmHg (06/10 0730) SpO2:  [97 %-100 %] 100 % (06/10 0730) FiO2 (%):  [35 %] 35 % (06/10 0359) Weight:  [78.9 kg (173 lb 15.1 oz)] 78.9 kg (173 lb 15.1 oz) (06/10 0200)  Weight change: 4.51 kg (9 lb 15.1 oz) Filed Weights   10/12/14 0243 10/13/14 0200 10/14/14 0200  Weight: 76.4 kg (168 lb 6.9 oz) 74.39 kg (164 lb) 78.9 kg (173 lb 15.1 oz)    Intake/Output: I/O last 3 completed shifts: In: 1700 [I.V.:2136.3; Other:30; IV [id number]:591] Out: 6384 [Urine:35; Emesis/NG output:300; YKZLD:3570; Stool:1]     Physical Exam: General: Critically ill appearing  HEENT Scleral icterus noted, ETT in place, NGT to suction  Neck RIJ TLC  Pulm/lungs Decreased breath sounds at bases, vent assisted: PRVC FiO2 35%  CVS/Heart S1S2 no rubs  Abdomen:  distended, tense, decreased Bowel sounds  Extremities: trace anasarca  Neurologic: On the vent  Skin: Right hip pressure ulcer stage II, sacral decub ulcer stage I  Access: Temp cath- rt femoral 5/25 Dr. Delana Meyer       Basic Metabolic Panel:  Recent Labs Lab 10/10/14 0533  10/11/14 1779  10/12/14 3903 10/12/14 0092 10/12/14 1541 10/12/14 2305 10/13/14 0439 10/13/14 1600 10/14/14 0017 10/14/14 0443  NA 132*  134*  < > 137  < > 138 136 135 137 138 UNABLE TO REPORT DUE TO ICTERIC SPECIMEN SEE COMMENTS 136  K 4.5  4.5  < > 3.5  < > 4.3 4.3 4.3 4.8 4.6 4.5 4.7 5.1  CL 102  103  < > 104  < > 102 104 101 103 103 102 104 105  CO2 24  24  < > 29  < > 28 27 27 28 27 28 26 27   GLUCOSE 224*   198*  < > 147*  < > 140* 167* 203* 224* 151* 199* 191* 150*  BUN 53*  53*  < > 32*  < > 28* 28* 28* 26* 27* 25* 23* 24*  CREATININE UNABLE TO REPORT DUE TO ICTERUS  SEE COMMENTS  < > SEE COMMENTS  < > SEE COMMENTS SEE COMMENTS UNABLE TO REPORT DUE TO ICTERUS UNABLE TO REPORT DUE TO ICTERUS NOT VALID UNABLE TO REPORT DUE TO ICTERIC SPECIMEN SEE COMMENTS NOT VALID  CALCIUM 8.7*  9.0  < > 9.1  < > 9.6 9.2 9.3 9.0 9.3 9.3 9.0 8.8*  MG 1.8  --  1.7  --  1.7  --   --   --  1.7  --   --  1.9  PHOS SEE COMMENTS  UNABLE TO REPORT DUE TO ICTERUS  < >  --   < >  --  SEE COMMENTS UNABLE TO REPORT DUE TO INTERFERENCE FROM ICTERUS UNABLE TO REPORT DUE TO ICTERUS SEE COMMENTS  --  SEE COMMENTS  --   < > = values in this interval not displayed.   CBC:  Recent Labs Lab 10/10/14 0533 10/11/14 0555 10/12/14 0444 10/13/14 0439 10/14/14 0443  WBC 17.6* 17.3* 20.8*  20.6* 21.5*  HGB 8.9* 9.5* 9.4* 9.8* 9.8*  HCT 26.5* 27.7* 27.9* 29.8* 29.6*  MCV 91.4 91.3 91.4 93.6 95.8  PLT 73* 86* 91* 91* 71*      Microbiology: Results for orders placed or performed during the hospital encounter of 09/22/2014  Blood culture (routine x 2)     Status: None   Collection Time: 09/08/2014 12:45 AM  Result Value Ref Range Status   Specimen Description BLOOD  Final   Special Requests NONE  Final   Culture  Setup Time   Final    GRAM POSITIVE COCCI IN CHAINS IN BOTH AEROBIC AND ANAEROBIC BOTTLES CRITICAL RESULT CALLED TO, READ BACK BY AND VERIFIED WITH: PAM CRAWFORD AT 1406 10/02/2014 DV    Culture STREPTOCOCCUS SPECIES  Final   Report Status 10/01/2014 FINAL  Final   Organism ID, Bacteria STREPTOCOCCUS SPECIES  Final      Susceptibility   Streptococcus species - MIC (ETEST)*    ERYTHROMYCIN Value in next row Resistant      RESISTANT6    PENICILLIN Value in next row Intermediate      INTERMEDIATE0.25    CEFTRIAXONE Value in next row Sensitive      SENSITIVE0.047    VANCOMYCIN Value in next row Sensitive       SENSITIVE1.0    LEVOFLOXACIN Value in next row Sensitive      SENSITIVE1.0    * STREPTOCOCCUS SPECIES  Blood culture (routine x 2)     Status: None   Collection Time: 09/06/2014 12:45 AM  Result Value Ref Range Status   Specimen Description BLOOD  Final   Special Requests NONE  Final   Culture  Setup Time   Final    GRAM POSITIVE COCCI IN CHAINS IN BOTH AEROBIC AND ANAEROBIC BOTTLES CRITICAL RESULT CALLED TO, READ BACK BY AND VERIFIED WITH: PAM CRAWFORD AT 1406 09/18/2014 DV    Culture   Final    STREPTOCOCCUS SPECIES REFER TO ACCESSION [id number] COLLECTED ON 09/27/2014 AT 0045 FOR SENSITIVITIES    Report Status 09/30/2014 FINAL  Final  Culture, blood (routine x 2)     Status: None   Collection Time: 10/04/14 11:50 AM  Result Value Ref Range Status   Specimen Description BLOOD  Final   Special Requests NONE  Final   Culture NO GROWTH 5 DAYS  Final   Report Status 10/09/2014 FINAL  Final  Culture, blood (routine x 2)     Status: None   Collection Time: 10/04/14 12:00 PM  Result Value Ref Range Status   Specimen Description BLOOD  Final   Special Requests NONE  Final   Culture NO GROWTH 5 DAYS  Final   Report Status 10/09/2014 FINAL  Final  Culture, respiratory (NON-Expectorated)     Status: None   Collection Time: 10/04/14  2:00 PM  Result Value Ref Range Status   Specimen Description INDUCED SPUTUM  Final   Special Requests Normal  Final   Gram Stain   Final    EXCELLENT SPECIMEN - 90-100% WBCS MANY WBC SEEN MANY YEAST    Culture MODERATE GROWTH CANDIDA ALBICANS  Final   Report Status 10/07/2014 FINAL  Final  Culture, blood (routine x 2)     Status: None (Preliminary result)   Collection Time: 10/12/14  1:34 PM  Result Value Ref Range Status   Specimen Description BLOOD  Final   Special Requests Normal  Final   Culture NO GROWTH < 24 HOURS  Final   Report  Status PENDING  Incomplete  Culture, blood (routine x 2)     Status: None (Preliminary result)   Collection Time: 10/12/14   4:30 PM  Result Value Ref Range Status   Specimen Description BLOOD  Final   Special Requests Normal  Final   Culture NO GROWTH < 24 HOURS  Final   Report Status PENDING  Incomplete    Coagulation Studies:  Recent Labs  10/12/14 0445 10/13/14 0439 10/14/14 0443  LABPROT 23.6* 23.1* 22.8*  INR 2.09 2.03 2.00    Urinalysis: No results for input(s): COLORURINE, LABSPEC, PHURINE, GLUCOSEU, HGBUR, BILIRUBINUR, KETONESUR, PROTEINUR, UROBILINOGEN, NITRITE, LEUKOCYTESUR in the last 72 hours.  Invalid input(s): APPERANCEUR    Imaging: Dg Abd Portable 1v  10/13/2014   CLINICAL DATA:  Ileus, sepsis and renal failure.  EXAM: PORTABLE ABDOMEN - 1 VIEW  COMPARISON:  10/03/2014  FINDINGS: Since a prior study there is improvement in ileus pattern with no significant gaseous distention of bowel identified. A nasogastric tube remains extending into the distal stomach. A right groin temporary dialysis catheter extends into the lower IVC.  IMPRESSION: Improvement in ileus with no significant dilated bowel loops identified.   Electronically Signed   By: Aletta Edouard M.D.   On: 10/13/2014 14:02     Medications:   . Marland KitchenTPN (CLINIMIX-E) Adult 70 mL/hr at 10/13/14 1814  . fentaNYL infusion INTRAVENOUS 400 mcg/hr (10/14/14 0700)  . insulin (NOVOLIN-R) infusion 0.9 mL/hr at 10/14/14 0700  . norepinephrine (LEVOPHED) Adult infusion 25 mcg/min (10/14/14 0700)  . pureflow 2,000 mL/hr at 10/14/14 0106  . vasopressin (PITRESSIN) infusion - *FOR SHOCK* 0.03 Units/min (10/14/14 0700)   . antiseptic oral rinse  7 mL Mouth Rinse QID  . budesonide (PULMICORT) nebulizer solution  0.5 mg Nebulization BID  . chlorhexidine  15 mL Mouth Rinse BID  . erythromycin  250 mg Intravenous 3 times per day  . ipratropium-albuterol  3 mL Nebulization QID  . lactulose  20 g Oral BID  . levofloxacin (LEVAQUIN) IV  500 mg Intravenous Q24H  . midodrine  10 mg Oral TID  . pantoprazole (PROTONIX) IV  40 mg Intravenous Q12H   . sodium chloride  10-40 mL Intracatheter Q12H  . sucralfate  1 g Oral 4 times per day   acetaminophen **OR** acetaminophen, albuterol, heparin, polyethylene glycol powder, sodium chloride  Assessment/ Plan:   64 y.o. African [place] female with diabetes mellitus type 2, hypertension, morbid obesity, osteoarthritis, status post right knee surgery, Amyloidosis with nephrotic syndrome with edema, hypoalbuminemia, renal insufficiency and proteinuria.  1. Acute Renal Failure with chronic kidney disease stage III with proteinuria with nephrotic syndrome secondary Renal Amyloidosis. Now progression to End Stage Renal Disease. Anuric on Continuous Renal Replacement Therapy.  - Continue CRRT, 2K bath, UF goal has been good.  - HD catheter not functioning well. We will try repositioning and call vascular for input.   2. Sepsis: Strep in blood cultures 5/25: afebrile. Repeat blood cultures with no growth.  - erythromycin and levofloxacin  - Fungal cultures sent due to patient being on TPN.  - hemodynamically unstable. Still requiring vasopressors: norepinephrine, vasopressin and midodrine.   3. Hepatic Failure: Bilirubin in the 30s, albumin 2.1, platelets 71,000 and INR elevated at 2.  - overall hepatic function seems to be deteriorating.   4. Amyloidosis: currently not a candidate for immunotherapy or chemotherapy.  - Appreciate oncology input.    Overall prognosis quite poor. Long discussion of prognosis and worsening clinical scenario with daughter in  law. Son is primary Media planner. He is optimistic. Wants to continue with tracheostomy and PEG. However unclear if these would be feasible in the near future.   LOS: [place], [place] 6/10/20168:11 AM

## 2014-10-14 NOTE — Progress Notes (Signed)
Updated Dr. Juleen China (renal MD) on lab results from Complete Metabolic Panel and CRRT running again after catheter rewire. MD confirmed 2K dialysis fluid for now. Renal panel timed for around 16:00 to be delayed to around 18:00 due to recent labs.

## 2014-10-14 NOTE — Plan of Care (Signed)
Problem: Phase I Progression Outcomes Goal: Pt./family verbalizes plan of care Outcome: Progressing Family only  Problem: Phase II Progression Outcomes Goal: Tolerating diet Outcome: Not Progressing Tolerates TPN not tube feedings

## 2014-10-14 NOTE — Progress Notes (Signed)
[place] NOTE  Pharmacy Consult for Electrolyte and CRRT medication management Indication: ICU Status, CRRT, TPN   No Known Allergies  Patient Measurements: Height: 5\' 1"  (154.9 cm) Weight: 173 lb 15.1 oz (78.9 kg) IBW/kg (Calculated) : 47.8   Vital Signs: Temp: 97.6 F (36.4 C) (06/10 0830) Temp Source: Oral (06/10 0830) BP: 85/61 mmHg (06/10 0930) Pulse Rate: 95 (06/10 0730) Intake/Output from previous day: 06/09 0701 - 06/10 0700 In: 3834.8 [I.V.:1446.8; IV Piggyback:400; HOZ:2248] Out: 4309 [Urine:5] Intake/Output from this shift: Total I/O In: 71.3 [I.V.:71.3] Out: 643 [Urine:5; Other:638] Vent settings for last 24 hours: Vent Mode:  [-] PRVC FiO2 (%):  [35 %] 35 % Set Rate:  [15 bmp] 15 bmp Vt Set:  [500 mL] 500 mL PEEP:  [5 cmH20] 5 cmH20 Plateau Pressure:  [20 cmH20] 20 cmH20  Labs:  Recent Labs  10/12/14 0444 10/12/14 0445  10/13/14 0439  10/14/14 0017 10/14/14 0443 10/14/14 0836  WBC 20.8*  --   --  20.6*  --   --  21.5*  --   HGB 9.4*  --   --  9.8*  --   --  9.8*  --   HCT 27.9*  --   --  29.8*  --   --  29.6*  --   PLT 91*  --   --  91*  --   --  71*  --   APTT 50*  --   --  45*  --   --  50*  --   INR  --  2.09  --  2.03  --   --  2.00  --   CREATININE SEE COMMENTS  --   < > NOT VALID  < > SEE COMMENTS NOT VALID SEE COMMENTS  MG 1.7  --   --  1.7  --   --  1.9  --   PHOS  --   --   < > SEE COMMENTS  --  SEE COMMENTS  --  SEE COMMENTS  ALBUMIN 2.3*  --   < > 2.3*  < > 2.1* 2.1* 1.9*  PROT 6.8  --   --  6.8  --   --  6.5  --   AST 208*  --   --  301*  --   --  376*  --   ALT 62*  --   --  87*  --   --  114*  --   ALKPHOS 1227*  --   --  1361*  --   --  1375*  --   BILITOT 35.9*  --   --  32.7*  --   --  32.8*  --   < > = values in this interval not displayed. CrCl cannot be calculated (Patient has no serum creatinine result on file.).   Recent Labs  10/14/14 0826 10/14/14 0932 10/14/14 1028  GLUCAP 158* 171*  163*    Microbiology: Recent Results (from the past 720 hour(s))  Blood culture (routine x 2)     Status: None   Collection Time: 09/30/2014 12:45 AM  Result Value Ref Range Status   Specimen Description BLOOD  Final   Special Requests NONE  Final   Culture  Setup Time   Final    GRAM POSITIVE COCCI IN CHAINS IN BOTH AEROBIC AND ANAEROBIC BOTTLES CRITICAL RESULT CALLED TO, READ BACK BY AND VERIFIED WITH: PAM CRAWFORD AT 1406 09/11/2014 DV    Culture STREPTOCOCCUS SPECIES  Final   Report Status 10/01/2014 FINAL  Final   Organism ID, Bacteria STREPTOCOCCUS SPECIES  Final      Susceptibility   Streptococcus species - MIC (ETEST)*    ERYTHROMYCIN Value in next row Resistant      RESISTANT6    PENICILLIN Value in next row Intermediate      INTERMEDIATE0.25    CEFTRIAXONE Value in next row Sensitive      SENSITIVE0.047    VANCOMYCIN Value in next row Sensitive      SENSITIVE1.0    LEVOFLOXACIN Value in next row Sensitive      SENSITIVE1.0    * STREPTOCOCCUS SPECIES  Blood culture (routine x 2)     Status: None   Collection Time: 09/09/2014 12:45 AM  Result Value Ref Range Status   Specimen Description BLOOD  Final   Special Requests NONE  Final   Culture  Setup Time   Final    GRAM POSITIVE COCCI IN CHAINS IN BOTH AEROBIC AND ANAEROBIC BOTTLES CRITICAL RESULT CALLED TO, READ BACK BY AND VERIFIED WITH: PAM CRAWFORD AT 1406 09/23/2014 DV    Culture   Final    STREPTOCOCCUS SPECIES REFER TO ACCESSION [id number] COLLECTED ON 10/01/2014 AT 0045 FOR SENSITIVITIES    Report Status 09/30/2014 FINAL  Final  Culture, blood (routine x 2)     Status: None   Collection Time: 10/04/14 11:50 AM  Result Value Ref Range Status   Specimen Description BLOOD  Final   Special Requests NONE  Final   Culture NO GROWTH 5 DAYS  Final   Report Status 10/09/2014 FINAL  Final  Culture, blood (routine x 2)     Status: None   Collection Time: 10/04/14 12:00 PM  Result Value Ref Range Status   Specimen Description  BLOOD  Final   Special Requests NONE  Final   Culture NO GROWTH 5 DAYS  Final   Report Status 10/09/2014 FINAL  Final  Culture, respiratory (NON-Expectorated)     Status: None   Collection Time: 10/04/14  2:00 PM  Result Value Ref Range Status   Specimen Description INDUCED SPUTUM  Final   Special Requests Normal  Final   Gram Stain   Final    EXCELLENT SPECIMEN - 90-100% WBCS MANY WBC SEEN MANY YEAST    Culture MODERATE GROWTH CANDIDA ALBICANS  Final   Report Status 10/07/2014 FINAL  Final  Culture, blood (routine x 2)     Status: None (Preliminary result)   Collection Time: 10/12/14  1:34 PM  Result Value Ref Range Status   Specimen Description BLOOD  Final   Special Requests Normal  Final   Culture NO GROWTH < 24 HOURS  Final   Report Status PENDING  Incomplete  Culture, blood (routine x 2)     Status: None (Preliminary result)   Collection Time: 10/12/14  4:30 PM  Result Value Ref Range Status   Specimen Description BLOOD  Final   Special Requests Normal  Final   Culture NO GROWTH 2 DAYS  Final   Report Status PENDING  Incomplete    Medications:  Scheduled:  . antiseptic oral rinse  7 mL Mouth Rinse QID  . budesonide (PULMICORT) nebulizer solution  0.5 mg Nebulization BID  . chlorhexidine  15 mL Mouth Rinse BID  . erythromycin  250 mg Intravenous 3 times per day  . ipratropium-albuterol  3 mL Nebulization QID  . lactulose  20 g Oral BID  . levofloxacin (LEVAQUIN) IV  500  mg Intravenous Q24H  . midodrine  10 mg Oral TID  . pantoprazole (PROTONIX) IV  40 mg Intravenous Q12H  . sodium chloride  10-40 mL Intracatheter Q12H  . sucralfate  1 g Oral 4 times per day   Infusions:  . Marland KitchenTPN (CLINIMIX-E) Adult 70 mL/hr at 10/13/14 1814  . fentaNYL infusion INTRAVENOUS 100 mcg/hr (10/14/14 0848)  . insulin (NOVOLIN-R) infusion 1.1 mL/hr at 10/14/14 0930  . norepinephrine (LEVOPHED) Adult infusion 22 mcg/min (10/14/14 0930)  . pureflow 2,500 mL/hr at 10/14/14 0905  . pureflow     . vasopressin (PITRESSIN) infusion - *FOR SHOCK* 0.03 Units/min (10/14/14 0700)   PRN: acetaminophen **OR** acetaminophen, albuterol, heparin, polyethylene glycol powder, sodium chloride  Assessment: 64 yo female requiring mechanical ventilation, CRRT, and TPN. Patient is scheduled for trach on 6/13.    Plan:   1. Shock: Patient currently on norepinephrine and vasopressin. Previously vasopressin discontinued and norepinephrine requirements elevated requiring restarting vasopressin on 6/8. Recommend continuing vasopressin until norepinephrine requirements consistently< 23mcg/min. If norepinephrine requirements increase to > 30 mcg/min while on vasopressin, would recommend reinitiating stress dose steroids, hydrocortisone 50mg  IV Q6hr.    2. Electrolytes/TPN: No replacement needed at this time. Will continue to monitor. Continue glucose stabilizer while patient is on TPN and CRRT.   3. CRRT Medication adjustment: no further adjustments warranted at this time.   4. Levofloxacin: Will continue patient on levofloxacin 500mg  IV q24hr through stop date on 6/14. Will need to adjust dosing if CRRT is stopped.    5. Constipation: Patient currently ordered erythromycin 250mg  IV Q8hr and lactulose 20g VT BID. Will continue to monitor.   Pharmacy will continue to monitor and adjust per consult.     Simpson,Michael L 10/14/2014,10:30 AM

## 2014-10-14 NOTE — Consult Note (Signed)
  Tracheostomy has been scheduled for Monday with Dr. Kathyrn Sheriff. Proceeding will depend on the patient's medical condition on the day of surgery. Please make every effort to have her as medically stable as possible for day of surgery, and get her coags corrected over the weekend. Platelets should be 80,000 or better as well. Thank you.

## 2014-10-14 NOTE — Progress Notes (Signed)
Follow up Note - Critical Care Medicine Note  Patient Details:    [name] is an 64 y.o. female with known history of amyloidosis nephrotic syndrome HTN who presetnted with altered mental status. Patient was noted to be septic on admission and started onb antibiotics. Subsequently has grown Streptococcus in blood on ventilator has failed SBT attempts.  Lines/tubes : Airway 7.5 mm (Active)  Secured at (cm) 21 cm 10/14/2014 11:27 AM  Measured From Lips 10/14/2014 11:27 AM  Secured Location Left 10/14/2014 11:27 AM  Secured By [employer] 10/14/2014 11:27 AM  Tube Holder Repositioned Yes 10/14/2014  8:31 AM  Cuff Pressure (cm H2O) 25 cm H2O 10/13/2014  3:47 PM  Site Condition Dry 10/14/2014 11:27 AM     CVC Triple Lumen 10/03/14 Right Internal jugular (Active)  Indication for Insertion or Continuance of Line Vasoactive infusions;Prolonged intravenous therapies 10/14/2014  8:30 AM  Site Assessment Clean;Dry;Intact 10/14/2014  8:30 AM  Proximal Lumen Status Infusing;Flushed;Blood return noted 10/14/2014  8:30 AM  Medial Infusing;Flushed;Blood return noted 10/14/2014  8:30 AM  Distal Lumen Status Infusing;Flushed;Blood return noted 10/14/2014  8:30 AM  Dressing Type Transparent 10/14/2014  8:30 AM  Dressing Status Old drainage 10/14/2014  8:30 AM  Line Care Cap(s) changed;Connections checked and tightened 10/14/2014  8:30 AM  Dressing Intervention Antimicrobial disc changed;Dressing changed 10/14/2014  8:30 AM  Dressing Change Due 10/21/14 10/14/2014  8:30 AM     NG/OG Tube Orogastric 18 Fr. Center mouth (Active)  Placement Verification Auscultation 10/14/2014  8:00 AM  Site Assessment Clean;Dry 10/14/2014  8:00 AM  Status Clamped 10/14/2014  8:00 AM  Drainage Appearance Brown;Green;Thin 10/14/2014  8:00 AM  Gastric Residual 370 mL 10/08/2014 12:00 AM  Intake (mL) 40 mL 10/11/2014  8:00 PM  Output (mL) 300 mL 10/13/2014  6:00 AM     Urethral Catheter unknown Latex 16 Fr. (Active)   Indication for Insertion or Continuance of Catheter Other (comment) 10/14/2014  8:00 AM  Site Assessment Clean;Intact 10/14/2014  8:00 AM  Catheter Maintenance Bag below level of bladder;Catheter secured;Drainage bag/tubing not touching floor;No dependent loops;Seal intact;Bag emptied prior to transport 10/14/2014  8:00 AM  Collection Container Standard drainage bag 10/14/2014  8:00 AM  Securement Method Securing device (Describe) 10/14/2014  8:00 AM  Urinary Catheter Interventions Unclamped 10/14/2014  8:00 AM  Input (mL) 30 mL 10/12/2014 10:00 PM  Output (mL) 5 mL 10/14/2014  8:30 AM    Microbiology/Sepsis markers: Results for orders placed or performed during the hospital encounter of 09/26/2014  Blood culture (routine x 2)     Status: None   Collection Time: 10/04/2014 12:45 AM  Result Value Ref Range Status   Specimen Description BLOOD  Final   Special Requests NONE  Final   Culture  Setup Time   Final    GRAM POSITIVE COCCI IN CHAINS IN BOTH AEROBIC AND ANAEROBIC BOTTLES CRITICAL RESULT CALLED TO, READ BACK BY AND VERIFIED WITH: PAM CRAWFORD AT 1406 09/06/2014 DV    Culture STREPTOCOCCUS SPECIES  Final   Report Status 10/01/2014 FINAL  Final   Organism ID, Bacteria STREPTOCOCCUS SPECIES  Final      Susceptibility   Streptococcus species - MIC (ETEST)*    ERYTHROMYCIN Value in next row Resistant      RESISTANT6    PENICILLIN Value in next row Intermediate      INTERMEDIATE0.25    CEFTRIAXONE Value in next row Sensitive      SENSITIVE0.047    VANCOMYCIN Value in  next row Sensitive      SENSITIVE1.0    LEVOFLOXACIN Value in next row Sensitive      SENSITIVE1.0    * STREPTOCOCCUS SPECIES  Blood culture (routine x 2)     Status: None   Collection Time: 09/09/2014 12:45 AM  Result Value Ref Range Status   Specimen Description BLOOD  Final   Special Requests NONE  Final   Culture  Setup Time   Final    GRAM POSITIVE COCCI IN CHAINS IN BOTH AEROBIC AND ANAEROBIC BOTTLES CRITICAL RESULT  CALLED TO, READ BACK BY AND VERIFIED WITH: PAM CRAWFORD AT 1406 09/27/2014 DV    Culture   Final    STREPTOCOCCUS SPECIES REFER TO ACCESSION [id number] COLLECTED ON 09/30/2014 AT 0045 FOR SENSITIVITIES    Report Status 09/30/2014 FINAL  Final  Culture, blood (routine x 2)     Status: None   Collection Time: 10/04/14 11:50 AM  Result Value Ref Range Status   Specimen Description BLOOD  Final   Special Requests NONE  Final   Culture NO GROWTH 5 DAYS  Final   Report Status 10/09/2014 FINAL  Final  Culture, blood (routine x 2)     Status: None   Collection Time: 10/04/14 12:00 PM  Result Value Ref Range Status   Specimen Description BLOOD  Final   Special Requests NONE  Final   Culture NO GROWTH 5 DAYS  Final   Report Status 10/09/2014 FINAL  Final  Culture, respiratory (NON-Expectorated)     Status: None   Collection Time: 10/04/14  2:00 PM  Result Value Ref Range Status   Specimen Description INDUCED SPUTUM  Final   Special Requests Normal  Final   Gram Stain   Final    EXCELLENT SPECIMEN - 90-100% WBCS MANY WBC SEEN MANY YEAST    Culture MODERATE GROWTH CANDIDA ALBICANS  Final   Report Status 10/07/2014 FINAL  Final  Culture, blood (routine x 2)     Status: None (Preliminary result)   Collection Time: 10/12/14  1:34 PM  Result Value Ref Range Status   Specimen Description BLOOD  Final   Special Requests Normal  Final   Culture NO GROWTH 2 DAYS  Final   Report Status PENDING  Incomplete  Culture, blood (routine x 2)     Status: None (Preliminary result)   Collection Time: 10/12/14  4:30 PM  Result Value Ref Range Status   Specimen Description BLOOD  Final   Special Requests Normal  Final   Culture NO GROWTH 2 DAYS  Final   Report Status PENDING  Incomplete    Anti-infectives:  Anti-infectives    Start     Dose/Rate Route Frequency Ordered Stop   10/12/14 0930  erythromycin 250 mg in sodium chloride 0.9 % 100 mL IVPB     250 mg 100 mL/hr over 60 Minutes Intravenous 3 times per  day 10/12/14 0929     10/16/2014 1800  levofloxacin (LEVAQUIN) IVPB 500 mg     500 mg 100 mL/hr over 60 Minutes Intravenous Every 24 hours 10/05/2014 0944 11-01-14 2359   10/05/14 1430  levofloxacin (LEVAQUIN) IVPB 750 mg  Status:  Discontinued     750 mg 100 mL/hr over 90 Minutes Intravenous Every 24 hours 10/05/14 1420 10/05/2014 0944   10/01/14 1800  cefTRIAXone (ROCEPHIN) 2 g in dextrose 5 % 50 mL IVPB - Premix  Status:  Discontinued     2 g 100 mL/hr over 30 Minutes Intravenous Every 24  hours 09/30/14 1621 10/05/14 1409   09/30/14 1630  cefTRIAXone (ROCEPHIN) 1 g in dextrose 5 % 50 mL IVPB - Premix  Status:  Discontinued     1 g 100 mL/hr over 30 Minutes Intravenous Every 24 hours 09/30/14 1621 10/01/14 1114   09/30/14 1545  cefTRIAXone (ROCEPHIN) 1 g in dextrose 5 % 50 mL IVPB - Premix  Status:  Discontinued     1 g 100 mL/hr over 30 Minutes Intravenous Every 24 hours 09/30/14 1535 09/30/14 1621   09/30/14 1545  clindamycin (CLEOCIN) IVPB 600 mg  Status:  Discontinued     600 mg 100 mL/hr over 30 Minutes Intravenous 3 times per day 09/30/14 1535 10/10/14 1453   09/15/2014 1300  vancomycin (VANCOCIN) 1,250 mg in sodium chloride 0.9 % 250 mL IVPB  Status:  Discontinued     1,250 mg 166.7 mL/hr over 90 Minutes Intravenous Every 24 hours 09/16/2014 0514 10/03/2014 1200   09/14/2014 1300  vancomycin (VANCOCIN) IVPB 1000 mg/200 mL premix  Status:  Discontinued     1,000 mg 200 mL/hr over 60 Minutes Intravenous Every 24 hours 09/04/2014 1200 09/30/14 1535   09/27/2014 0600  piperacillin-tazobactam (ZOSYN) IVPB 3.375 g  Status:  Discontinued     3.375 g 12.5 mL/hr over 240 Minutes Intravenous 3 times per day 09/29/2014 0508 09/29/14 1019   09/29/2014 0045  vancomycin (VANCOCIN) IVPB 1000 mg/200 mL premix     1,000 mg 200 mL/hr over 60 Minutes Intravenous  Once 09/16/2014 0043 09/09/2014 0229   09/06/2014 0045  piperacillin-tazobactam (ZOSYN) IVPB 3.375 g     3.375 g 12.5 mL/hr over 240 Minutes Intravenous  Once  10/02/2014 0043 09/08/2014 0251       Consults: Treatment Team:  Murlean Iba, MD Adrian Prows, MD Lloyd Huger, MD Josefine Class, MD Clyde Canterbury, MD    Studies: Ct Abdomen Pelvis Wo Contrast  10/01/2014   CLINICAL DATA:  Right flank cellulitis and edema.  EXAM: CT ABDOMEN AND PELVIS WITHOUT CONTRAST  TECHNIQUE: Multidetector CT imaging of the abdomen and pelvis was performed following the standard protocol without IV contrast.  COMPARISON:  08/31/2014  FINDINGS: Lower chest: Small bilateral pleural effusions are noted left greater than right. There is airspace consolidation noted within both lung bases. Patchy ground-glass and airspace opacity within the medial left upper lobe is noted, image number 3/series 4.  Hepatobiliary: There is no suspicious liver abnormality identified. Increased attenuation within the gallbladder may Roux represent stones and/or sludge. No biliary dilatation.  Pancreas: Normal appearance of the pancreas.  Spleen: The spleen is on unremarkable.  Adrenals/Urinary Tract: Normal appearance of the left adrenal gland. Similar appearance of low-attenuation right adrenal gland nodule measuring 2.9 cm, image 30/series 2. The kidneys are both on unremarkable. The urinary bladder is collapsed around a Foley catheter balloon.  Stomach/Bowel: Moderate distension of the stomach containing fluid level. The small bowel loops have a normal course and caliber. Normal appearance of the colon.  Vascular/Lymphatic: There is a right femoral dialysis catheter with tip in the IVC. Abdominal aorta has a normal caliber. No adenopathy.  Reproductive: Previous hysterectomy.  No adnexal mass.  Other: Moderate free fluid is identified within the abdomen and pelvis. There is diffuse body wall edema and skin thickening. No focal fluid collection identified to suggest abscess.  Musculoskeletal: Degenerative disc disease noted within the lumbar spine.  IMPRESSION: 1. Bilateral pleural  effusions, ascites and anasarca is identified consistent with fluid overload state. 2. Subcutaneous fat stranding  and skin thickening may be related to anasarca or cellulitis. 3. No fluid collections identified. 4. Left lung opacities may reflect pneumonia and/r or areas of asymmetric edema.   Electronically Signed   By: Kerby Moors M.D.   On: 10/01/2014 15:17   Dg Chest 1 View  09/18/2014   CLINICAL DATA:  Central line placement.  EXAM: CHEST  1 VIEW  COMPARISON:  06/26/2014  FINDINGS: Right IJ central line with tip crossing midline and terminating in the region of the left brachiocephalic vein.  Hypoventilation. When accounting for this there is no cardiomegaly or change in aortic tortuosity. No pneumothorax, edema, effusion, or definitive pneumonia.  These results were called by telephone at the time of interpretation on 09/17/2014 at 3:14 am to [place] , who verbally acknowledged these results.  IMPRESSION: 1. Right IJ central line with tip crossing midline and projecting at the left brachiocephalic vein. No pneumothorax. 2. Hypoventilation.   Electronically Signed   By: Monte Fantasia M.D.   On: 09/07/2014 03:15   Ct Head Wo Contrast  10/04/2014   CLINICAL DATA:  Altered mental status after fall from couch. Posterior neck pain. Initial encounter.  EXAM: CT HEAD WITHOUT CONTRAST  CT CERVICAL SPINE WITHOUT CONTRAST  TECHNIQUE: Multidetector CT imaging of the head and cervical spine was performed following the standard protocol without intravenous contrast. Multiplanar CT image reconstructions of the cervical spine were also generated.  COMPARISON:  None.  FINDINGS: CT HEAD FINDINGS  Skull and Sinuses:Negative for fracture or destructive process. The mastoids, middle ears, and imaged paranasal sinuses are clear.  Orbits: No acute abnormality.  Brain: No evidence of acute infarction, hemorrhage, hydrocephalus, or mass lesion/mass effect.  CT CERVICAL SPINE FINDINGS  Thyroid gland has indistinct margins  with mild surrounding fat stranding. A sub cm dense nodule is noted on the right. No surrounding adenopathy.  No evidence of acute fracture or traumatic malalignment. No gross cervical canal hematoma. There is degenerative disc disease and endplate spurring, most progressed at C4-5, C5-6, and C6-7. Spurring is most notable at C5-6 with right uncovertebral spurs causing advanced foraminal stenosis. A superimposed right paracentral disc herniation with vacuum phenomenon contributes to the C5-6 right foraminal stenosis.  IMPRESSION: 1. No evidence of intracranial or cervical spine injury. 2. Mid and lower cervical degenerative disc disease with foraminal stenoses, most advanced on the right at C5-6. 3. Edema noted around the thyroid gland, suspicious for thyroiditis. Correlate with endocrine history and thyroid function tests.   Electronically Signed   By: Monte Fantasia M.D.   On: 09/27/2014 02:31   Ct Cervical Spine Wo Contrast  09/09/2014   CLINICAL DATA:  Altered mental status after fall from couch. Posterior neck pain. Initial encounter.  EXAM: CT HEAD WITHOUT CONTRAST  CT CERVICAL SPINE WITHOUT CONTRAST  TECHNIQUE: Multidetector CT imaging of the head and cervical spine was performed following the standard protocol without intravenous contrast. Multiplanar CT image reconstructions of the cervical spine were also generated.  COMPARISON:  None.  FINDINGS: CT HEAD FINDINGS  Skull and Sinuses:Negative for fracture or destructive process. The mastoids, middle ears, and imaged paranasal sinuses are clear.  Orbits: No acute abnormality.  Brain: No evidence of acute infarction, hemorrhage, hydrocephalus, or mass lesion/mass effect.  CT CERVICAL SPINE FINDINGS  Thyroid gland has indistinct margins with mild surrounding fat stranding. A sub cm dense nodule is noted on the right. No surrounding adenopathy.  No evidence of acute fracture or traumatic malalignment. No gross cervical canal hematoma.  There is degenerative  disc disease and endplate spurring, most progressed at C4-5, C5-6, and C6-7. Spurring is most notable at C5-6 with right uncovertebral spurs causing advanced foraminal stenosis. A superimposed right paracentral disc herniation with vacuum phenomenon contributes to the C5-6 right foraminal stenosis.  IMPRESSION: 1. No evidence of intracranial or cervical spine injury. 2. Mid and lower cervical degenerative disc disease with foraminal stenoses, most advanced on the right at C5-6. 3. Edema noted around the thyroid gland, suspicious for thyroiditis. Correlate with endocrine history and thyroid function tests.   Electronically Signed   By: Monte Fantasia M.D.   On: 09/20/2014 02:31   Nm Cardiac Muga Rest  09/20/2014   CLINICAL DATA:  . Evaluate cardiac function in relation to chemotherapy.  EXAM: NUCLEAR MEDICINE CARDIAC BLOOD POOL IMAGING (MUGA)  TECHNIQUE: Cardiac multi-gated acquisition was performed at rest following intravenous injection of Tc-74m labeled red blood cells.  RADIOPHARMACEUTICALS:  21.9 mCi Technetium-67m in-vitro labeled red blood cells IV  COMPARISON:  None.  FINDINGS: No  focal wall motion abnormality of the left ventricle.  Calculated left ventricular ejection fraction equals 65%  IMPRESSION: Left ventricular ejection fraction equals 65%   Electronically Signed   By: Suzy Bouchard M.D.   On: 09/20/2014 09:31   Dg Chest Port 1 View  10/07/2014   CLINICAL DATA:  Respiratory failure/ventilator dependency.  EXAM: PORTABLE CHEST - 1 VIEW  COMPARISON:  10/03/2014  FINDINGS: Endotracheal tube tip 3.1 cm above the carina. Right internal jugular line tip: Upper SVC. Nasogastric tube enters the stomach.  Moderate enlargement of the cardiopericardial silhouette noted with bilateral increased airspace opacities.  Bilateral degenerative glenohumeral arthropathy. Tortuous thoracic aorta.  IMPRESSION: 1. Worsened bilateral airspace opacities. In the context of the patient's moderate enlargement of the  cardiopericardial silhouette, the appearance is suspicious for acute pulmonary edema. 2. The endotracheal tube has been successfully retracted, and the tubes and lines appear satisfactorily positioned.   Electronically Signed   By: Van Clines M.D.   On: 10/07/2014 08:42   Dg Chest Port 1 View  10/03/2014   CLINICAL DATA:  Orogastric and endotracheal tube placement.  EXAM: PORTABLE CHEST - 1 VIEW  COMPARISON:  10/03/2014 at 12:51 p.m.  FINDINGS: Orogastric tube passes below the diaphragm to curl within a mostly decompressed stomach.  Endotracheal tube tip projects the right mainstem bronchus.  Patchy airspace consolidation is noted in the peripheral right upper mid and lower lung and inferior to the right hilum.  Hazy left sided opacity noted previously has mostly resolved. There is mild residual opacity at the left lung base likely atelectasis. There is no pulmonary edema.  No pneumothorax.  IMPRESSION: 1. Endotracheal tube is now positioned. Tip is in the right mainstem bronchus. It will need to be retracted 3 cm for optimal positioning. Critical Value/emergent results were called by telephone at the time of interpretation on 10/03/2014 at 5:25 pm to the nurse in charge of this patient, who verbally acknowledged these results and will relay these results to respiratory. 2. Persistent right lung consolidation. Improved left lung aeration. 3. Orogastric tube well positioned curling within a mostly decompressed stomach.   Electronically Signed   By: Lajean Manes M.D.   On: 10/03/2014 17:26   Dg Chest Port 1 View  10/03/2014   CLINICAL DATA:  Status post central line adjustment  EXAM: PORTABLE CHEST - 1 VIEW  COMPARISON:  10/03/2014  FINDINGS: Cardiac shadow is stable. Increased density is noted over the left hemi thorax  consistent with a posterior fusion. The previously seen central line is been repositioned and now lies within the proximal superior vena cava. No pneumothorax is noted. Patchy changes are  seen within the lungs.  IMPRESSION: Patchy infiltrative changes are again identified bilaterally. Diffuse increased density is noted on the left which may be related to an evolving effusion. The central line has been repositioned without evidence of pneumothorax.   Electronically Signed   By: Inez Catalina M.D.   On: 10/03/2014 13:19   Dg Chest Port 1 View  10/03/2014   CLINICAL DATA:  Central line adjustment.  EXAM: PORTABLE CHEST - 1 VIEW  COMPARISON:  10/03/2014 at 10:18 a.m.  FINDINGS: Right sided central line curls overlying the right neck. This does not enter the thorax. It is not changed from the earlier study.  No change in the appearance of the lungs.  No pneumothorax.  IMPRESSION: Right central line is seen curling over the neck, not entering thorax and not changed from the prior study.   Electronically Signed   By: Lajean Manes M.D.   On: 10/03/2014 12:13   Dg Chest Port 1 View  10/03/2014   CLINICAL DATA:  Central line placement  EXAM: PORTABLE CHEST - 1 VIEW  COMPARISON:  None.  FINDINGS: Cardiomediastinal silhouette is stable. Patchy airspace is right upper lobe left midlung and left lower lobe again noted suspicious for multifocal pneumonia. There is only partially visualized tortuous central line in right neck region. Clinical correlation is necessary. There is no pneumothorax.  IMPRESSION: Again noted patchy airspace disease bilaterally suspicious for multifocal pneumonia. Tortuous only partially visualized central line in right neck region. Probable significant retracted right IJ line. Clinical correlation is necessary. These results were called by telephone at the time of interpretation on 10/03/2014 at 10:53 am to ICU. Nurse Sheryn Bison , who verbally acknowledged these results.   Electronically Signed   By: Lahoma Crocker M.D.   On: 10/03/2014 10:55   Dg Chest Port 1 View  10/02/2014   CLINICAL DATA:  Shortness of breath, acute onset. Initial encounter.  EXAM: PORTABLE CHEST - 1 VIEW   COMPARISON:  Chest radiograph performed 09/06/2014  FINDINGS: The lungs are hypoexpanded. Patchy bilateral airspace opacities are noted, raising concern for multifocal pneumonia. Underlying asymmetric interstitial edema on the left side cannot be excluded. A small left pleural effusion is suspected. No pneumothorax is seen.  The cardiomediastinal silhouette is mildly enlarged. No acute osseous abnormalities are seen. A right IJ line is noted ending overlying the left brachiocephalic vein.  IMPRESSION: 1. Lungs hypoexpanded. Patchy bilateral airspace opacities raise concern for multifocal pneumonia. 2. Underlying asymmetric interstitial edema on the left side cannot be excluded. Suspect small left pleural effusion. 3. Mild cardiomegaly noted. 4. Right IJ line noted ending overlying the left brachiocephalic vein, as noted on the prior study. It appears to have been retracted mildly since the prior study.   Electronically Signed   By: Garald Balding M.D.   On: 10/02/2014 20:31   Dg Chest Portable 1 View  09/10/2014   CLINICAL DATA:  Central line adjustment  EXAM: PORTABLE CHEST - 1 VIEW  COMPARISON:  Same day at 2:43 a.m.  FINDINGS: Shortening of the right IJ central line, but tip still terminates left of midline near the brachiocephalic vein.  Hypoventilation. Stable heart size and mediastinal contours, size accentuated by technique. There is interstitial crowding without edema, effusion, pneumothorax, or overt pneumonia.  IMPRESSION: 1. Despite right central line shortening the tip  still terminates near the left brachiocephalic vein. 2. Hypoventilation.   Electronically Signed   By: Monte Fantasia M.D.   On: 09/10/2014 04:28   Dg Abd Portable 1v  10/13/2014   CLINICAL DATA:  Ileus, sepsis and renal failure.  EXAM: PORTABLE ABDOMEN - 1 VIEW  COMPARISON:  10/03/2014  FINDINGS: Since a prior study there is improvement in ileus pattern with no significant gaseous distention of bowel identified. A nasogastric tube  remains extending into the distal stomach. A right groin temporary dialysis catheter extends into the lower IVC.  IMPRESSION: Improvement in ileus with no significant dilated bowel loops identified.   Electronically Signed   By: Aletta Edouard M.D.   On: 10/13/2014 14:02   Dg Abd Portable 1v  10/03/2014   CLINICAL DATA:  OG tube placement  EXAM: PORTABLE ABDOMEN - 1 VIEW  COMPARISON:  CT abdomen pelvis dated 09/23/2014  FINDINGS: Enteric tube terminates in the gastric cardia.  Nonobstructive bowel gas pattern.  IMPRESSION: Enteric tube terminates in the gastric cardia.   Electronically Signed   By: Julian Hy M.D.   On: 10/03/2014 17:22     Events:  Subjective:    Overnight Issues: patient is on pressors levophed and vasopressin. Her pressures remain tenuous. On full vent support at this time.  Objective:  Vital signs for last 24 hours: Temp:  [97.6 F (36.4 C)-98 F (36.7 C)] 97.6 F (36.4 C) (06/10 0830) Pulse Rate:  [88-95] 95 (06/10 0730) Resp:  [15-25] 24 (06/10 1130) BP: (74-104)/(41-68) 96/53 mmHg (06/10 1130) SpO2:  [97 %-100 %] 100 % (06/10 1130) FiO2 (%):  [35 %] 35 % (06/10 1127) Weight:  [78.9 kg (173 lb 15.1 oz)] 78.9 kg (173 lb 15.1 oz) (06/10 0200)  Hemodynamic parameters for last 24 hours:    Intake/Output from previous day: 06/09 0701 - 06/10 0700 In: 3834.8 [I.V.:1446.8; IV Piggyback:400; NFA:2130] Out: 4309 [Urine:5]  Intake/Output this shift: Total I/O In: 174.2 [I.V.:174.2] Out: 643 [Urine:5; Other:638]  Vent settings for last 24 hours: Vent Mode:  [-] PRVC FiO2 (%):  [35 %] 35 % Set Rate:  [15 bmp] 15 bmp Vt Set:  [500 mL] 500 mL PEEP:  [5 cmH20] 5 cmH20 Plateau Pressure:  [20 cmH20] 20 cmH20  Physical Exam:  Head atraumatic normocephalic Eyes PERRLA no incterus Throat:orally intubated Respiratory CTA bilateral few ronchi no rales noted Cardio RRR s1s2 normal no rubs GI soft non-tender no rebound Extremities no C/C/C Neuro not  responsive GCS<8T  Results for orders placed or performed during the hospital encounter of 09/08/2014 (from the past 24 hour(s))  Glucose, capillary     Status: Abnormal   Collection Time: 10/13/14 12:22 PM  Result Value Ref Range   Glucose-Capillary 198 (H) 65 - 99 mg/dL  Glucose, capillary     Status: Abnormal   Collection Time: 10/13/14  1:26 PM  Result Value Ref Range   Glucose-Capillary 168 (H) 65 - 99 mg/dL  Glucose, capillary     Status: Abnormal   Collection Time: 10/13/14  2:30 PM  Result Value Ref Range   Glucose-Capillary 179 (H) 65 - 99 mg/dL  Glucose, capillary     Status: Abnormal   Collection Time: 10/13/14  3:33 PM  Result Value Ref Range   Glucose-Capillary 173 (H) 65 - 99 mg/dL  Renal function     Status: Abnormal   Collection Time: 10/13/14  4:00 PM  Result Value Ref Range   Sodium UNABLE TO REPORT DUE TO ICTERIC SPECIMEN  135 - 145 mmol/L   Potassium 4.5 3.5 - 5.1 mmol/L   Chloride 102 101 - 111 mmol/L   CO2 28 22 - 32 mmol/L   Glucose, Bld 199 (H) 65 - 99 mg/dL   BUN 25 (H) 6 - 20 mg/dL   Creatinine, Ser UNABLE TO REPORT DUE TO ICTERIC SPECIMEN 0.44 - 1.00 mg/dL   Calcium 9.3 8.9 - 10.3 mg/dL   Phosphorus  2.5 - 4.6 mg/dL   Albumin 2.1 (L) 3.5 - 5.0 g/dL   GFR calc non Af Amer NOT CALCULATED >60 mL/min   GFR calc Af Amer NOT CALCULATED >60 mL/min   Anion gap UNABLE TO REPORT DUE TO ICTERIC SPECIMEN 5 - 15  Glucose, capillary     Status: Abnormal   Collection Time: 10/13/14  4:25 PM  Result Value Ref Range   Glucose-Capillary 175 (H) 65 - 99 mg/dL  Glucose, capillary     Status: Abnormal   Collection Time: 10/13/14  5:30 PM  Result Value Ref Range   Glucose-Capillary 172 (H) 65 - 99 mg/dL  Glucose, capillary     Status: Abnormal   Collection Time: 10/13/14  6:35 PM  Result Value Ref Range   Glucose-Capillary 198 (H) 65 - 99 mg/dL  Glucose, capillary     Status: Abnormal   Collection Time: 10/13/14  7:42 PM  Result Value Ref Range   Glucose-Capillary  191 (H) 65 - 99 mg/dL  Glucose, capillary     Status: Abnormal   Collection Time: 10/13/14  8:45 PM  Result Value Ref Range   Glucose-Capillary 164 (H) 65 - 99 mg/dL  Glucose, capillary     Status: Abnormal   Collection Time: 10/13/14 10:02 PM  Result Value Ref Range   Glucose-Capillary 153 (H) 65 - 99 mg/dL  Glucose, capillary     Status: Abnormal   Collection Time: 10/13/14 11:05 PM  Result Value Ref Range   Glucose-Capillary 146 (H) 65 - 99 mg/dL  Glucose, capillary     Status: Abnormal   Collection Time: 10/14/14 12:11 AM  Result Value Ref Range   Glucose-Capillary 167 (H) 65 - 99 mg/dL  Renal function     Status: Abnormal   Collection Time: 10/14/14 12:17 AM  Result Value Ref Range   Sodium SEE COMMENTS 135 - 145 mmol/L   Potassium 4.7 3.5 - 5.1 mmol/L   Chloride 104 101 - 111 mmol/L   CO2 26 22 - 32 mmol/L   Glucose, Bld 191 (H) 65 - 99 mg/dL   BUN 23 (H) 6 - 20 mg/dL   Creatinine, Ser SEE COMMENTS 0.44 - 1.00 mg/dL   Calcium 9.0 8.9 - 10.3 mg/dL   Phosphorus SEE COMMENTS 2.5 - 4.6 mg/dL   Albumin 2.1 (L) 3.5 - 5.0 g/dL   GFR calc non Af Amer NOT CALCULATED >60 mL/min   GFR calc Af Amer NOT CALCULATED >60 mL/min   Anion gap UNABLE TO CALCULATE VALUE, NV SODIUM RESULT. 5 - 15  Glucose, capillary     Status: Abnormal   Collection Time: 10/14/14 12:59 AM  Result Value Ref Range   Glucose-Capillary 148 (H) 65 - 99 mg/dL  Glucose, capillary     Status: Abnormal   Collection Time: 10/14/14  2:04 AM  Result Value Ref Range   Glucose-Capillary 161 (H) 65 - 99 mg/dL  Glucose, capillary     Status: Abnormal   Collection Time: 10/14/14  3:08 AM  Result Value Ref Range   Glucose-Capillary 142 (H)  65 - 99 mg/dL  Glucose, capillary     Status: Abnormal   Collection Time: 10/14/14  4:12 AM  Result Value Ref Range   Glucose-Capillary 145 (H) 65 - 99 mg/dL  Magnesium     Status: None   Collection Time: 10/14/14  4:43 AM  Result Value Ref Range   Magnesium 1.9 1.7 - 2.4 mg/dL   APTT     Status: Abnormal   Collection Time: 10/14/14  4:43 AM  Result Value Ref Range   aPTT 50 (H) 24 - 36 seconds  CBC     Status: Abnormal   Collection Time: 10/14/14  4:43 AM  Result Value Ref Range   WBC 21.5 (H) 3.6 - 11.0 K/uL   RBC 3.09 (L) 3.80 - 5.20 MIL/uL   Hemoglobin 9.8 (L) 12.0 - 16.0 g/dL   HCT 29.6 (L) 35.0 - 47.0 %   MCV 95.8 80.0 - 100.0 fL   MCH 31.7 26.0 - 34.0 pg   MCHC 33.1 32.0 - 36.0 g/dL   RDW 18.8 (H) 11.5 - 14.5 %   Platelets 71 (L) 150 - 440 K/uL  Comprehensive metabolic panel     Status: Abnormal   Collection Time: 10/14/14  4:43 AM  Result Value Ref Range   Sodium 136 135 - 145 mmol/L   Potassium 5.1 3.5 - 5.1 mmol/L   Chloride 105 101 - 111 mmol/L   CO2 27 22 - 32 mmol/L   Glucose, Bld 150 (H) 65 - 99 mg/dL   BUN 24 (H) 6 - 20 mg/dL   Creatinine, Ser NOT VALID 0.44 - 1.00 mg/dL   Calcium 8.8 (L) 8.9 - 10.3 mg/dL   Total Protein 6.5 6.5 - 8.1 g/dL   Albumin 2.1 (L) 3.5 - 5.0 g/dL   AST 376 (H) 15 - 41 U/L   ALT 114 (H) 14 - 54 U/L   Alkaline Phosphatase 1375 (H) 38 - 126 U/L   Total Bilirubin 32.8 (HH) 0.3 - 1.2 mg/dL   GFR calc non Af Amer NOT CALCULATED >60 mL/min   GFR calc Af Amer NOT CALCULATED >60 mL/min   Anion gap 4 (L) 5 - 15  Protime-INR     Status: Abnormal   Collection Time: 10/14/14  4:43 AM  Result Value Ref Range   Prothrombin Time 22.8 (H) 11.4 - 15.0 seconds   INR 2.00   Glucose, capillary     Status: Abnormal   Collection Time: 10/14/14  5:11 AM  Result Value Ref Range   Glucose-Capillary 124 (H) 65 - 99 mg/dL  Glucose, capillary     Status: Abnormal   Collection Time: 10/14/14  6:09 AM  Result Value Ref Range   Glucose-Capillary 137 (H) 65 - 99 mg/dL  Glucose, capillary     Status: Abnormal   Collection Time: 10/14/14  7:15 AM  Result Value Ref Range   Glucose-Capillary 151 (H) 65 - 99 mg/dL  Glucose, capillary     Status: Abnormal   Collection Time: 10/14/14  8:26 AM  Result Value Ref Range    Glucose-Capillary 158 (H) 65 - 99 mg/dL  Renal function     Status: Abnormal   Collection Time: 10/14/14  8:36 AM  Result Value Ref Range   Sodium 135 135 - 145 mmol/L   Potassium 4.1 3.5 - 5.1 mmol/L   Chloride 103 101 - 111 mmol/L   CO2 27 22 - 32 mmol/L   Glucose, Bld 187 (H) 65 - 99 mg/dL  BUN 25 (H) 6 - 20 mg/dL   Creatinine, Ser SEE COMMENTS 0.44 - 1.00 mg/dL   Calcium 8.5 (L) 8.9 - 10.3 mg/dL   Phosphorus SEE COMMENTS 2.5 - 4.6 mg/dL   Albumin 1.9 (L) 3.5 - 5.0 g/dL   GFR calc non Af Amer NOT CALCULATED >60 mL/min   GFR calc Af Amer NOT CALCULATED >60 mL/min   Anion gap 5 5 - 15  Glucose, capillary     Status: Abnormal   Collection Time: 10/14/14  9:32 AM  Result Value Ref Range   Glucose-Capillary 171 (H) 65 - 99 mg/dL  Glucose, capillary     Status: Abnormal   Collection Time: 10/14/14 10:28 AM  Result Value Ref Range   Glucose-Capillary 163 (H) 65 - 99 mg/dL  Glucose, capillary     Status: Abnormal   Collection Time: 10/14/14 11:27 AM  Result Value Ref Range   Glucose-Capillary 151 (H) 65 - 99 mg/dL     Assessment/Plan:   PULMONARY -will continue with PRVC at this time -titrate FiO2 as tolerated -will titrate PEEP as tolerated -MDI as ordered -patient is a full code   CARDIO -still requiring pressors at this time -will titrate  RENAL -on CRRT presently  -renal is following  ID -will continue with antibiotics levaquin and erythromycin -cultures growing streptococcus  GI -on Pantoprazole  ENDO -Monitor FSBS -continue with insulin as ordered   LOS: 16 days    Critical Care Total Time: 35MIN    *Care during the described time interval was provided by me and/or other providers on the critical care team.  I have reviewed this patient's available data, including medical history, events of note, physical examination and test results as part of my evaluation.

## 2014-10-14 NOTE — Progress Notes (Signed)
[facility] at [place] NAME: [name]    MR#:  [id number]  DATE OF BIRTH:  [birth year]  SUBJECTIVE:  CHIEF COMPLAINT:   Chief Complaint  Patient presents with  . Altered Mental Status    Critically ill. On pressors. CRRT. Full vent support. Sedated. TPN  REVIEW OF SYSTEMS:    ROS  Unable to give history due to  patient intubated   DRUG ALLERGIES:  No Known Allergies  VITALS:  Blood pressure 88/59, pulse 95, temperature 98.3 F (36.8 C), temperature source Oral, resp. rate 18, height 5\' 1"  (1.549 m), weight 78.9 kg (173 lb 15.1 oz), SpO2 100 %.  PHYSICAL EXAMINATION:   Physical Exam  GENERAL:  64 y.o.-year-old patient lying in the bed critically ill EYES: Pupils equal, round, reactive to light and accommodation. + scleral icterus.  HEENT: Head atraumatic, normocephalic.Intubated NECK:  Supple, no jugular venous distention. No thyroid enlargement, no tenderness. Right IJ . Right femoral Dialysis catheter. LUNGS: Coarse breath sounds, poor air movement, vent CARDIOVASCULAR: S1, S2 normal. No murmurs. ABDOMEN: Soft, nontender, nondistended. Bowel sounds decreased. No organomegaly or mass. EXTREMITIES: No cyanosis, clubbing. Anasarca. NEUROLOGIC:  Sedated. SKIN:  Superficial ulcer right thigh. Decubitus ulcer   LABORATORY PANEL:   CBC  Recent Labs Lab 10/14/14 0443  WBC 21.5*  HGB 9.8*  HCT 29.6*  PLT 71*   ------------------------------------------------------------------------------------------------------------------  Chemistries   Recent Labs Lab 10/14/14 0443 10/14/14 0836  NA 136 135  K 5.1 4.1  CL 105 103  CO2 27 27  GLUCOSE 150* 187*  BUN 24* 25*  CREATININE NOT VALID SEE COMMENTS  CALCIUM 8.8* 8.5*  MG 1.9  --   AST 376*  --   ALT 114*  --   ALKPHOS 1375*  --   BILITOT 32.8*  --     ------------------------------------------------------------------------------------------------------------------  Cardiac Enzymes No results for input(s): TROPONINI in the last 168 hours. ------------------------------------------------------------------------------------------------------------------  RADIOLOGY:  Dg Chest Port 1 View  10/07/2014   IMPRESSION: 1. Worsened bilateral airspace opacities. In the context of the patient's moderate enlargement of the cardiopericardial silhouette, the appearance is suspicious for acute pulmonary edema. 2. The endotracheal tube has been successfully retracted, and the tubes and lines appear satisfactorily positioned.   Electronically Signed   By: Van Clines M.D.   On: 10/07/2014 08:42     ASSESSMENT AND PLAN:   64 year old African American female history of nephrotic syndrome, amyloidosis, essential hypertension presenting on 5/25 with altered mental status/confusion after apparently falling out of bed. Admitted for septic shock  * Septic shock with right flank/thigh cellulitis with streptococcus bacteremia - ID following - Changed to Levaquin till 2014/11/14 and clindamycin stopped. Continue pressors. Off steroids. - Echo- No vegetations. Repeat blood cultures negative to date.  * Acute renal failure over CKD3 due to ATN. From septic shock in the setting of amyloidosis On CRRT. Appreciate nephrology help. Severe hypoalbuminemia. Will likely progress to ESRD.  * Acute liver failure- Due to sepsis - No improvement.  CT abd/pelvis showed no biliary dilation or obstruction Severe hyperbilirubinemia > 30. Gastroenterology following.  lactulose  * Ileus Improving on Xray But significant OP in NG still. Continue LIS TFs when able to tolerate.  * Elevated INR - Due to liver failure. DIC unlikely per oncology. 4 units FFP given 6/1 minimal improvement in INR  * Acute encephalopathy - Due to acute illness. CT head nothing  acute.  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 Plan  was for chemo as OP with Dr. Grayland Ormond but now on hold due to acute illness. Oncology following  * Upper GI bleed with Acute blood loss  anemia over AOCD EGD showed severe esophagitis. On protonix  IV Transfused 4 units this admission.  * Type 2 diabetes, uncomplicated: Hold oral agents, at insulin slide scale every 6 hours Accu-Chek  * Essential hypertension: Held all meds  * Venous thromboembolism prophylactic: Heparin subcutaneous stopped due to high INR and low platelets and GIB  * Nutrition TPN  * Acute respiratory failure -Full vent support - due to suspected aspiration of gastrointestinal content - Pulmonology following - Full ventilator support  * Trach and PEG ENT consulted. Will check INR. FFP prior to procedure if needed. Will be high risk for any procedure with co-morbidities.  * Decubitus ulcer  CODE STATUS: FULL CODE  Multi organ failure. High risk for cardiac arrest and death   TOTAL CRITICAL CARE TIME TAKING CARE OF THIS PATIENT: 35 minutes.    Hillary Bow R M.D on 10/14/2014 at 12:55 PM  Between 7am to 6pm - Pager - [phone]  After 6pm go to [email or web address] - password [email or web address] [facility]  [facility]  Office  [phone]  CC: Primary care physician; [name], MD

## 2014-10-14 NOTE — Progress Notes (Signed)
[facility]  Daily Progress Note   Subjective  - 8 Days Post-Op  Patient remains critically ill, on CRRT.  Intubated Catheter has one lumen which does not withdraw blood, both flush  Objective Filed Vitals:   10/14/14 0700 10/14/14 0730 10/14/14 0827 10/14/14 0830  BP: 74/41 96/66 102/64 93/50  Pulse: 91 95    Temp:    97.6 F (36.4 C)  TempSrc:    Oral  Resp: 16 25 21 21   Height:      Weight:      SpO2: 99% 100% 100% 100%    Intake/Output Summary (Last 24 hours) at 10/14/14 0843 Last data filed at 10/14/14 0700  Gross per 24 hour  Intake 3834.81 ml  Output   4114 ml  Net -279.19 ml    PULM  Course BS on the vent CV  RRR VASC  Catheter C/D/I, no bleeding or erythema  Laboratory CBC    Component Value Date/Time   WBC 21.5* 10/14/2014 0443   WBC 9.2 08/22/2014 1712   HGB 9.8* 10/14/2014 0443   HGB 12.3 08/22/2014 1712   HCT 29.6* 10/14/2014 0443   HCT 38.3 08/22/2014 1712   PLT 71* 10/14/2014 0443   PLT 503* 08/22/2014 1712    BMET    Component Value Date/Time   NA 136 10/14/2014 0443   NA 136 08/22/2014 1712   K 5.1 10/14/2014 0443   K 2.6* 08/22/2014 1712   CL 105 10/14/2014 0443   CL 92* 08/22/2014 1712   CO2 27 10/14/2014 0443   CO2 35* 08/22/2014 1712   GLUCOSE 150* 10/14/2014 0443   GLUCOSE 167* 08/22/2014 1712   BUN 24* 10/14/2014 0443   BUN 29* 08/22/2014 1712   CREATININE NOT VALID 10/14/2014 0443   CREATININE 1.78* 08/22/2014 1712   CALCIUM 8.8* 10/14/2014 0443   CALCIUM 8.2* 08/22/2014 1712   GFRNONAA NOT CALCULATED 10/14/2014 0443   GFRNONAA 29* 08/22/2014 1712   GFRAA NOT CALCULATED 10/14/2014 0443   GFRAA 34* 08/22/2014 1712    Assessment/Planning: ARF, temp catheter not working well for CRRT They are going to use tpa to try to open catheter, if that does not work, I can change catheter out for new one this afternoon Vent dependence: will likely need  trach Thrombocytopenia MSOF       [name]  10/14/2014, 8:43 AM

## 2014-10-15 LAB — RENAL FUNCTION PANEL
ALBUMIN: 1.8 g/dL — AB (ref 3.5–5.0)
ALBUMIN: 1.9 g/dL — AB (ref 3.5–5.0)
ANION GAP: 7 (ref 5–15)
Albumin: 1.9 g/dL — ABNORMAL LOW (ref 3.5–5.0)
Anion gap: 6 (ref 5–15)
Anion gap: 7 (ref 5–15)
BUN: 24 mg/dL — ABNORMAL HIGH (ref 6–20)
BUN: 25 mg/dL — AB (ref 6–20)
BUN: 27 mg/dL — ABNORMAL HIGH (ref 6–20)
CALCIUM: 8.5 mg/dL — AB (ref 8.9–10.3)
CO2: 26 mmol/L (ref 22–32)
CO2: 27 mmol/L (ref 22–32)
CO2: 26 mmol/L (ref 22–32)
CREATININE: UNDETERMINED mg/dL (ref 0.44–1.00)
Calcium: 8.6 mg/dL — ABNORMAL LOW (ref 8.9–10.3)
Calcium: 8.7 mg/dL — ABNORMAL LOW (ref 8.9–10.3)
Chloride: 102 mmol/L (ref 101–111)
Chloride: 102 mmol/L (ref 101–111)
Chloride: 102 mmol/L (ref 101–111)
GLUCOSE: 121 mg/dL — AB (ref 65–99)
Glucose, Bld: 213 mg/dL — ABNORMAL HIGH (ref 65–99)
Glucose, Bld: 224 mg/dL — ABNORMAL HIGH (ref 65–99)
POTASSIUM: 3.4 mmol/L — AB (ref 3.5–5.1)
Potassium: 3.6 mmol/L (ref 3.5–5.1)
Potassium: 3.9 mmol/L (ref 3.5–5.1)
SODIUM: 135 mmol/L (ref 135–145)
SODIUM: 136 mmol/L (ref 135–145)
Sodium: 134 mmol/L — ABNORMAL LOW (ref 135–145)

## 2014-10-15 LAB — GLUCOSE, CAPILLARY
GLUCOSE-CAPILLARY: 223 mg/dL — AB (ref 65–99)
GLUCOSE-CAPILLARY: 155 mg/dL — AB (ref 65–99)
GLUCOSE-CAPILLARY: 161 mg/dL — AB (ref 65–99)
Glucose-Capillary: 123 mg/dL — ABNORMAL HIGH (ref 65–99)
Glucose-Capillary: 128 mg/dL — ABNORMAL HIGH (ref 65–99)
Glucose-Capillary: 129 mg/dL — ABNORMAL HIGH (ref 65–99)
Glucose-Capillary: 150 mg/dL — ABNORMAL HIGH (ref 65–99)
Glucose-Capillary: 182 mg/dL — ABNORMAL HIGH (ref 65–99)
Glucose-Capillary: 194 mg/dL — ABNORMAL HIGH (ref 65–99)
Glucose-Capillary: 200 mg/dL — ABNORMAL HIGH (ref 65–99)
Glucose-Capillary: 203 mg/dL — ABNORMAL HIGH (ref 65–99)
Glucose-Capillary: 210 mg/dL — ABNORMAL HIGH (ref 65–99)
Glucose-Capillary: 226 mg/dL — ABNORMAL HIGH (ref 65–99)

## 2014-10-15 LAB — CBC
HCT: 27.3 % — ABNORMAL LOW (ref 35.0–47.0)
Hemoglobin: 8.9 g/dL — ABNORMAL LOW (ref 12.0–16.0)
MCH: 31.6 pg (ref 26.0–34.0)
MCHC: 32.8 g/dL (ref 32.0–36.0)
MCV: 96.3 fL (ref 80.0–100.0)
Platelets: 70 10*3/uL — ABNORMAL LOW (ref 150–440)
RBC: 2.83 MIL/uL — ABNORMAL LOW (ref 3.80–5.20)
RDW: 21.3 % — ABNORMAL HIGH (ref 11.5–14.5)
WBC: 13.4 10*3/uL — ABNORMAL HIGH (ref 3.6–11.0)

## 2014-10-15 LAB — APTT: aPTT: 51 s — ABNORMAL HIGH (ref 24–36)

## 2014-10-15 LAB — MAGNESIUM: MAGNESIUM: 1.8 mg/dL (ref 1.7–2.4)

## 2014-10-15 MED ORDER — SODIUM CHLORIDE 0.9 % IV SOLN
INTRAVENOUS | Status: DC
  Administered 2014-10-15: 1.6 [IU]/h via INTRAVENOUS
  Filled 2014-10-15: qty 2.5

## 2014-10-15 MED ORDER — INSULIN ASPART 100 UNIT/ML ~~LOC~~ SOLN
1.0000 [IU] | SUBCUTANEOUS | Status: DC
Start: 2014-10-15 — End: 2014-10-15
  Administered 2014-10-15: 2 [IU] via SUBCUTANEOUS
  Filled 2014-10-15: qty 2

## 2014-10-15 MED ORDER — SODIUM CHLORIDE 0.9 % IV SOLN
INTRAVENOUS | Status: DC
  Administered 2014-10-15: 1.5 [IU]/h via INTRAVENOUS
  Administered 2014-10-16: 2.5 [IU]/h via INTRAVENOUS
  Administered 2014-10-16: 0.8 [IU]/h via INTRAVENOUS
  Administered 2014-10-16: 3.1 [IU]/h via INTRAVENOUS
  Administered 2014-10-16: 0.3 [IU]/h via INTRAVENOUS
  Administered 2014-10-16: 2.5 [IU]/h via INTRAVENOUS
  Filled 2014-10-15: qty 2.5

## 2014-10-15 MED ORDER — TRACE MINERALS CR-CU-MN-SE-ZN 10-1000-500-60 MCG/ML IV SOLN
INTRAVENOUS | Status: DC
  Administered 2014-10-15: 19:00:00 via INTRAVENOUS
  Filled 2014-10-15: qty 1680

## 2014-10-15 MED ORDER — PUREFLOW DIALYSIS SOLUTION
INTRAVENOUS | Status: DC
  Administered 2014-10-15 – 2014-10-18 (×11): via INTRAVENOUS_CENTRAL
  Filled 2014-10-15: qty 5000

## 2014-10-15 MED ORDER — INSULIN GLARGINE 100 UNIT/ML ~~LOC~~ SOLN
10.0000 [IU] | SUBCUTANEOUS | Status: DC
  Administered 2014-10-15: 10 [IU] via SUBCUTANEOUS
  Filled 2014-10-15: qty 0.1

## 2014-10-15 MED ORDER — M.V.I. ADULT IV INJ
INJECTION | INTRAVENOUS | Status: AC
  Administered 2014-10-16: 19:00:00 via INTRAVENOUS
  Filled 2014-10-15: qty 1680

## 2014-10-15 NOTE — Progress Notes (Signed)
[facility] at [place] NAME: [name]    MR#:  [id number]  DATE OF BIRTH:  [birth year]  SUBJECTIVE:  CHIEF COMPLAINT:   Chief Complaint  Patient presents with  . Altered Mental Status    Critically ill. On pressors. CRRT. Full vent support. Sedated. TPN  REVIEW OF SYSTEMS:    ROS  Unable to give history due to  patient intubated   DRUG ALLERGIES:  No Known Allergies  VITALS:  Blood pressure 84/58, pulse 87, temperature 96.7 F (35.9 C), temperature source Axillary, resp. rate 20, height 5\' 1"  (1.549 m), weight 75.1 kg (165 lb 9.1 oz), SpO2 98 %.  PHYSICAL EXAMINATION:   Physical Exam  GENERAL:  64 y.o.-year-old patient lying in the bed critically ill EYES: Pupils equal, round, reactive to light and accommodation. + scleral icterus.  HEENT: Head atraumatic, normocephalic.Intubated NECK:  Supple, no jugular venous distention. No thyroid enlargement, no tenderness. Right IJ . Right femoral Dialysis catheter. LUNGS: Coarse breath sounds, poor air movement, vent CARDIOVASCULAR: S1, S2 normal. No murmurs. ABDOMEN: Soft, nontender, nondistended. Bowel sounds decreased. No organomegaly or mass. EXTREMITIES: No cyanosis, clubbing. Anasarca. NEUROLOGIC:  Sedated. SKIN:  Superficial ulcer right thigh. Decubitus ulcer   LABORATORY PANEL:   CBC  Recent Labs Lab 10/15/14 0307  WBC 13.4*  HGB 8.9*  HCT 27.3*  PLT 70*   ------------------------------------------------------------------------------------------------------------------  Chemistries   Recent Labs Lab 10/14/14 1341  10/15/14 0307 10/15/14 0727  NA 134*  < >  --  134*  K 4.4  < >  --  3.4*  CL 103  < >  --  102  CO2 25  < >  --  26  GLUCOSE 220*  < >  --  224*  BUN 32*  < >  --  25*  CREATININE SEE COMMENTS  < >  --  UNABLE TO REPORT DUE TO ICTERUS  CALCIUM 8.5*  < >  --  8.5*  MG  --   --  1.8  --   AST 362*  --   --   --   ALT 107*  --   --    --   ALKPHOS 1291*  --   --   --   BILITOT 27.7*  --   --   --   < > = values in this interval not displayed. ------------------------------------------------------------------------------------------------------------------   RADIOLOGY:  Dg Chest Port 1 View  10/07/2014   IMPRESSION: 1. Worsened bilateral airspace opacities. In the context of the patient's moderate enlargement of the cardiopericardial silhouette, the appearance is suspicious for acute pulmonary edema. 2. The endotracheal tube has been successfully retracted, and the tubes and lines appear satisfactorily positioned.   Electronically Signed   By: Van Clines M.D.   On: 10/07/2014 08:42     ASSESSMENT AND PLAN:   64 year old African American female history of nephrotic syndrome, amyloidosis, essential hypertension presenting on 5/25 with altered mental status/confusion after apparently falling out of bed. Admitted for septic shock  * Septic shock with right flank/thigh cellulitis with streptococcus bacteremia - ID following - Changed to Levaquin till 10-25-14 and clindamycin stopped. On erythromycin, Continue pressors. Off steroids. - Echo- No vegetations. Repeat blood cultures negative to date.  * Acute renal failure over CKD3 due to ATN. From septic shock in the setting of amyloidosis On CRRT. Appreciate nephrology help. Severe hypoalbuminemia. Will likely progress to ESRD.  * Acute liver failure- Due to sepsis -  No improvement.  CT abd/pelvis showed no biliary dilation or obstruction Severe hyperbilirubinemia > 30. Gastroenterology following.  lactulose  * Ileus Improving on Xray But significant OP in NG still. Continue LIS TFs when able to tolerate.  * Elevated INR - Due to liver failure. DIC unlikely per oncology. 4 units FFP given 6/1 minimal improvement in INR   Will Need FFP again on Monday for tracheostomy.  * Acute encephalopathy - Due to acute illness. CT head nothing acute.  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 Plan was for chemo as OP with Dr. Grayland Ormond but now on hold due to acute illness. Oncology following  * Upper GI bleed with Acute blood loss  anemia over AOCD EGD showed severe esophagitis. On protonix  IV Transfused 4 units this admission.  * Type 2 diabetes, uncomplicated: Hold oral agents, at insulin slide scale every 6 hours Accu-Chek  * Essential hypertension: Held all meds  * Venous thromboembolism prophylactic: Heparin subcutaneous stopped due to high INR and low platelets and GIB  * Nutrition TPN  * Acute respiratory failure -Full vent support - due to suspected aspiration of gastrointestinal content - Pulmonology following - Full ventilator support  * Trach and PEG ENT consulted. Will check INR. FFP prior to procedure if needed. Will be high risk for any procedure with co-morbidities.  * Decubitus ulcer  CODE STATUS: FULL CODE  Multi organ failure. High risk for cardiac arrest and death  discussed with pt's son in room.   TOTAL CRITICAL CARE TIME TAKING CARE OF THIS PATIENT: 40 minutes.    [name] M.D on 10/15/2014 at 12:52 PM  Between 7am to 6pm - Pager - [phone]  After 6pm go to [email or web address] - password [email or web address] [facility]  [place] [facility]  Office  [phone]  CC: Primary care physician; [name], MD

## 2014-10-15 NOTE — Progress Notes (Signed)
PHARMACY - CRITICAL CARE PROGRESS NOTE  Pharmacy Consult for Electrolyte and CRRT medication management Indication: ICU Status, CRRT, TPN   No Known Allergies  Patient Measurements: Height: 5\' 1"  (154.9 cm) Weight: 165 lb 9.1 oz (75.1 kg) IBW/kg (Calculated) : 47.8   Vital Signs: Temp: 96 F (35.6 C) (06/11 0815) Temp Source: Axillary (06/11 0815) BP: 90/65 mmHg (06/11 0930) Pulse Rate: 81 (06/11 0930) Intake/Output from previous day: 06/10 0701 - 06/11 0700 In: 3329.4 [I.V.:1249.4; IV Piggyback:400; TPN:1680] Out: 4813 [Urine:15; Emesis/NG output:650] Intake/Output from this shift: Total I/O In: 222.9 [I.V.:82.9; TPN:140] Out: 223 [Urine:5; Other:218] Vent settings for last 24 hours: Vent Mode:  [-] SIMV FiO2 (%):  [35 %] 35 % Set Rate:  [8 bmp-15 bmp] 8 bmp Vt Set:  [500 mL] 500 mL PEEP:  [5 cmH20] 5 cmH20 Pressure Support:  [20 cmH20] 20 cmH20  Labs:  Recent Labs  10/13/14 0439  10/14/14 0017 10/14/14 0443 10/14/14 0836 10/14/14 1341 10/14/14 1815 10/14/14 2345 10/15/14 0307 10/15/14 0727  WBC 20.6*  --   --  21.5*  --   --   --   --  13.4*  --   HGB 9.8*  --   --  9.8*  --   --   --   --  8.9*  --   HCT 29.8*  --   --  29.6*  --   --   --   --  27.3*  --   PLT 91*  --   --  71*  --   --   --   --  70*  --   APTT 45*  --   --  50*  --   --   --   --  51*  --   INR 2.03  --   --  2.00  --   --   --   --   --   --   CREATININE NOT VALID  < > SEE COMMENTS NOT VALID SEE COMMENTS SEE COMMENTS SEE COMMENTS SEE COMMENTS  --  UNABLE TO REPORT DUE TO ICTERUS  MG 1.7  --   --  1.9  --   --   --   --  1.8  --   PHOS SEE COMMENTS  --  SEE COMMENTS  --  SEE COMMENTS  --  SEE COMMENTS  --   --   --   ALBUMIN 2.3*  < > 2.1* 2.1* 1.9* 1.8* 1.8* 1.8*  --  1.9*  PROT 6.8  --   --  6.5  --  5.8*  --   --   --   --   AST 301*  --   --  376*  --  362*  --   --   --   --   ALT 87*  --   --  114*  --  107*  --   --   --   --   ALKPHOS 1361*  --   --  1375*  --  1291*  --   --    --   --   BILITOT 32.7*  --   --  32.8*  --  27.7*  --   --   --   --   < > = values in this interval not displayed. CrCl cannot be calculated (Patient has no serum creatinine result on file.).   Recent Labs  10/14/14 2346 10/15/14 0359 10/15/14 0734  GLUCAP 182* 194* 226*  Microbiology: Recent Results (from the past 720 hour(s))  Blood culture (routine x 2)     Status: None   Collection Time: 09/23/2014 12:45 AM  Result Value Ref Range Status   Specimen Description BLOOD  Final   Special Requests NONE  Final   Culture  Setup Time   Final    GRAM POSITIVE COCCI IN CHAINS IN BOTH AEROBIC AND ANAEROBIC BOTTLES CRITICAL RESULT CALLED TO, READ BACK BY AND VERIFIED WITH: PAM CRAWFORD AT 1406 09/11/2014 DV    Culture STREPTOCOCCUS SPECIES  Final   Report Status 10/01/2014 FINAL  Final   Organism ID, Bacteria STREPTOCOCCUS SPECIES  Final      Susceptibility   Streptococcus species - MIC (ETEST)*    ERYTHROMYCIN Value in next row Resistant      RESISTANT6    PENICILLIN Value in next row Intermediate      INTERMEDIATE0.25    CEFTRIAXONE Value in next row Sensitive      SENSITIVE0.047    VANCOMYCIN Value in next row Sensitive      SENSITIVE1.0    LEVOFLOXACIN Value in next row Sensitive      SENSITIVE1.0    * STREPTOCOCCUS SPECIES  Blood culture (routine x 2)     Status: None   Collection Time: 09/13/2014 12:45 AM  Result Value Ref Range Status   Specimen Description BLOOD  Final   Special Requests NONE  Final   Culture  Setup Time   Final    GRAM POSITIVE COCCI IN CHAINS IN BOTH AEROBIC AND ANAEROBIC BOTTLES CRITICAL RESULT CALLED TO, READ BACK BY AND VERIFIED WITH: PAM CRAWFORD AT 1406 09/10/2014 DV    Culture   Final    STREPTOCOCCUS SPECIES REFER TO ACCESSION [id number] COLLECTED ON 10/03/2014 AT 0045 FOR SENSITIVITIES    Report Status 09/30/2014 FINAL  Final  Culture, blood (routine x 2)     Status: None   Collection Time: 10/04/14 11:50 AM  Result Value Ref Range Status    Specimen Description BLOOD  Final   Special Requests NONE  Final   Culture NO GROWTH 5 DAYS  Final   Report Status 10/09/2014 FINAL  Final  Culture, blood (routine x 2)     Status: None   Collection Time: 10/04/14 12:00 PM  Result Value Ref Range Status   Specimen Description BLOOD  Final   Special Requests NONE  Final   Culture NO GROWTH 5 DAYS  Final   Report Status 10/09/2014 FINAL  Final  Culture, respiratory (NON-Expectorated)     Status: None   Collection Time: 10/04/14  2:00 PM  Result Value Ref Range Status   Specimen Description INDUCED SPUTUM  Final   Special Requests Normal  Final   Gram Stain   Final    EXCELLENT SPECIMEN - 90-100% WBCS MANY WBC SEEN MANY YEAST    Culture MODERATE GROWTH CANDIDA ALBICANS  Final   Report Status 10/07/2014 FINAL  Final  Culture, blood (routine x 2)     Status: None (Preliminary result)   Collection Time: 10/12/14  1:34 PM  Result Value Ref Range Status   Specimen Description BLOOD  Final   Special Requests Normal  Final   Culture NO GROWTH 3 DAYS  Final   Report Status PENDING  Incomplete  Culture, blood (routine x 2)     Status: None (Preliminary result)   Collection Time: 10/12/14  4:30 PM  Result Value Ref Range Status   Specimen Description BLOOD  Final  Special Requests Normal  Final   Culture NO GROWTH 3 DAYS  Final   Report Status PENDING  Incomplete    Medications:  Scheduled:  . antiseptic oral rinse  7 mL Mouth Rinse QID  . budesonide (PULMICORT) nebulizer solution  0.5 mg Nebulization BID  . chlorhexidine  15 mL Mouth Rinse BID  . erythromycin  250 mg Intravenous 3 times per day  . insulin aspart  2-6 Units Subcutaneous 6 times per day  . insulin glargine  10 Units Subcutaneous Q24H  . ipratropium-albuterol  3 mL Nebulization QID  . lactulose  20 g Oral BID  . levofloxacin (LEVAQUIN) IV  500 mg Intravenous Q24H  . midodrine  10 mg Oral TID  . pantoprazole (PROTONIX) IV  40 mg Intravenous Q12H  . sodium  chloride  10-40 mL Intracatheter Q12H  . sucralfate  1 g Oral 4 times per day   Infusions:  . Marland KitchenTPN (CLINIMIX-E) Adult 70 mL/hr at 10/14/14 1900  . fentaNYL infusion INTRAVENOUS 100 mcg/hr (10/15/14 0800)  . norepinephrine (LEVOPHED) Adult infusion 22 mcg/min (10/15/14 0915)  . pureflow    . pureflow 2,500 mL/hr at 10/15/14 0851  . vasopressin (PITRESSIN) infusion - *FOR SHOCK* 0.03 Units/min (10/15/14 0700)   PRN: acetaminophen **OR** acetaminophen, albuterol, heparin, polyethylene glycol powder, sodium chloride  Assessment: 64 yo female requiring mechanical ventilation, CRRT, and TPN. Patient is scheduled for trach on 6/13.    Plan:   1. Shock: Patient currently on norepinephrine and vasopressin. Previously vasopressin discontinued and norepinephrine requirements elevated requiring restarting vasopressin on 6/8. Recommend continuing vasopressin until norepinephrine requirements consistently< 29mcg/min. If norepinephrine requirements increase to > 30 mcg/min while on vasopressin, would recommend reinitiating stress dose steroids, hydrocortisone 50mg  IV Q6hr.    2. Electrolytes/TPN: Potassium low this AM, per RN notes plan to adjust for in CRRT. Will recheck tomorrow AM.  No replacement needed at this time.  Pt ordered lantus 10units QHS and SSI Q4H. SSI 12 units given, FSBG: 148-226.  3. CRRT Medication adjustment: no further adjustments warranted at this time.   4. Levofloxacin: Will continue patient on levofloxacin 500mg  IV q24hr through stop date on 6/14. Will need to adjust dosing if CRRT is stopped.    5. Constipation: Patient currently ordered erythromycin 250mg  IV Q8hr and lactulose 20g VT BID. BM documented 6/10. Will continue to monitor.   Pharmacy will continue to monitor and adjust per consult.     Charidy Cappelletti C 10/15/2014,9:30 AM

## 2014-10-15 NOTE — Progress Notes (Signed)
Follow up Note - Critical Care Medicine Note  Patient Details:    [name] is an 64 y.o. female with known history of amyloidosis nephrotic syndrome HTN who presetnted with altered mental status. Patient was noted to be septic on admission and started onb antibiotics. Subsequently has grown Streptococcus in blood on ventilator has failed SBT attempts awaiting trach Lines/tubes : Airway 7.5 mm (Active)  Secured at (cm) 21 cm 10/15/2014 11:37 AM  Measured From Lips 10/15/2014 11:37 AM  Secured Location Right 10/15/2014 11:37 AM  Secured By [employer] 10/15/2014 11:37 AM  Tube Holder Repositioned Yes 10/14/2014  8:31 AM  Cuff Pressure (cm H2O) 25 cm H2O 10/13/2014  3:47 PM  Site Condition Dry 10/15/2014 11:37 AM     CVC Triple Lumen 10/03/14 Right Internal jugular (Active)  Indication for Insertion or Continuance of Line Vasoactive infusions;Prolonged intravenous therapies 10/15/2014  7:15 AM  Site Assessment Clean;Dry;Intact 10/15/2014  7:15 AM  Proximal Lumen Status Infusing 10/15/2014  7:15 AM  Medial Infusing 10/15/2014  7:15 AM  Distal Lumen Status Infusing;Flushed;Blood return noted 10/15/2014  7:15 AM  Dressing Type Transparent 10/15/2014  7:15 AM  Dressing Status Clean;Dry;Intact;Antimicrobial disc in place 10/15/2014  7:15 AM  Line Care Cap(s) changed;Connections checked and tightened 10/14/2014  8:30 AM  Dressing Intervention Antimicrobial disc changed;Dressing changed 10/14/2014  8:30 AM  Dressing Change Due 10/21/14 10/14/2014  8:30 AM     NG/OG Tube Orogastric 18 Fr. Center mouth (Active)  Placement Verification Auscultation 10/15/2014  7:11 AM  Site Assessment Clean;Dry 10/15/2014  7:11 AM  Status Suction-low intermittent 10/15/2014  7:11 AM  Drainage Appearance Brown;Green;Thin 10/15/2014  7:11 AM  Gastric Residual 370 mL 10/08/2014 12:00 AM  Intake (mL) 40 mL 10/11/2014  8:00 PM  Output (mL) 650 mL 10/14/2014  3:57 PM     Urethral Catheter unknown Latex 16 Fr. (Active)   Indication for Insertion or Continuance of Catheter Other (comment) 10/15/2014  7:11 AM  Site Assessment Clean;Intact 10/15/2014  7:11 AM  Catheter Maintenance Bag below level of bladder;Catheter secured;Drainage bag/tubing not touching floor;Insertion date on drainage bag;No dependent loops;Seal intact;Bag emptied prior to transport 10/15/2014  7:11 AM  Collection Container Standard drainage bag 10/15/2014  7:11 AM  Securement Method Securing device (Describe) 10/15/2014  7:11 AM  Urinary Catheter Interventions Unclamped 10/15/2014  7:11 AM  Input (mL) 30 mL 10/12/2014 10:00 PM  Output (mL) 5 mL 10/15/2014  9:00 AM    Microbiology/Sepsis markers: Results for orders placed or performed during the hospital encounter of 09/12/2014  Blood culture (routine x 2)     Status: None   Collection Time: 09/16/2014 12:45 AM  Result Value Ref Range Status   Specimen Description BLOOD  Final   Special Requests NONE  Final   Culture  Setup Time   Final    GRAM POSITIVE COCCI IN CHAINS IN BOTH AEROBIC AND ANAEROBIC BOTTLES CRITICAL RESULT CALLED TO, READ BACK BY AND VERIFIED WITH: PAM CRAWFORD AT 1406 09/12/2014 DV    Culture STREPTOCOCCUS SPECIES  Final   Report Status 10/01/2014 FINAL  Final   Organism ID, Bacteria STREPTOCOCCUS SPECIES  Final      Susceptibility   Streptococcus species - MIC (ETEST)*    ERYTHROMYCIN Value in next row Resistant      RESISTANT6    PENICILLIN Value in next row Intermediate      INTERMEDIATE0.25    CEFTRIAXONE Value in next row Sensitive      SENSITIVE0.047  VANCOMYCIN Value in next row Sensitive      SENSITIVE1.0    LEVOFLOXACIN Value in next row Sensitive      SENSITIVE1.0    * STREPTOCOCCUS SPECIES  Blood culture (routine x 2)     Status: None   Collection Time: 09/27/2014 12:45 AM  Result Value Ref Range Status   Specimen Description BLOOD  Final   Special Requests NONE  Final   Culture  Setup Time   Final    GRAM POSITIVE COCCI IN CHAINS IN BOTH AEROBIC AND  ANAEROBIC BOTTLES CRITICAL RESULT CALLED TO, READ BACK BY AND VERIFIED WITH: PAM CRAWFORD AT 1406 09/09/2014 DV    Culture   Final    STREPTOCOCCUS SPECIES REFER TO ACCESSION [id number] COLLECTED ON 09/21/2014 AT 0045 FOR SENSITIVITIES    Report Status 09/30/2014 FINAL  Final  Culture, blood (routine x 2)     Status: None   Collection Time: 10/04/14 11:50 AM  Result Value Ref Range Status   Specimen Description BLOOD  Final   Special Requests NONE  Final   Culture NO GROWTH 5 DAYS  Final   Report Status 10/09/2014 FINAL  Final  Culture, blood (routine x 2)     Status: None   Collection Time: 10/04/14 12:00 PM  Result Value Ref Range Status   Specimen Description BLOOD  Final   Special Requests NONE  Final   Culture NO GROWTH 5 DAYS  Final   Report Status 10/09/2014 FINAL  Final  Culture, respiratory (NON-Expectorated)     Status: None   Collection Time: 10/04/14  2:00 PM  Result Value Ref Range Status   Specimen Description INDUCED SPUTUM  Final   Special Requests Normal  Final   Gram Stain   Final    EXCELLENT SPECIMEN - 90-100% WBCS MANY WBC SEEN MANY YEAST    Culture MODERATE GROWTH CANDIDA ALBICANS  Final   Report Status 10/07/2014 FINAL  Final  Culture, blood (routine x 2)     Status: None (Preliminary result)   Collection Time: 10/12/14  1:34 PM  Result Value Ref Range Status   Specimen Description BLOOD  Final   Special Requests Normal  Final   Culture NO GROWTH 3 DAYS  Final   Report Status PENDING  Incomplete  Culture, blood (routine x 2)     Status: None (Preliminary result)   Collection Time: 10/12/14  4:30 PM  Result Value Ref Range Status   Specimen Description BLOOD  Final   Special Requests Normal  Final   Culture NO GROWTH 3 DAYS  Final   Report Status PENDING  Incomplete    Anti-infectives:  Anti-infectives    Start     Dose/Rate Route Frequency Ordered Stop   10/12/14 0930  erythromycin 250 mg in sodium chloride 0.9 % 100 mL IVPB     250 mg 100 mL/hr over  60 Minutes Intravenous 3 times per day 10/12/14 0929     10/19/2014 1800  levofloxacin (LEVAQUIN) IVPB 500 mg     500 mg 100 mL/hr over 60 Minutes Intravenous Every 24 hours 10/05/2014 0944 Nov 06, 2014 2359   10/05/14 1430  levofloxacin (LEVAQUIN) IVPB 750 mg  Status:  Discontinued     750 mg 100 mL/hr over 90 Minutes Intravenous Every 24 hours 10/05/14 1420 10/24/2014 0944   10/01/14 1800  cefTRIAXone (ROCEPHIN) 2 g in dextrose 5 % 50 mL IVPB - Premix  Status:  Discontinued     2 g 100 mL/hr over 30 Minutes  Intravenous Every 24 hours 09/30/14 1621 10/05/14 1409   09/30/14 1630  cefTRIAXone (ROCEPHIN) 1 g in dextrose 5 % 50 mL IVPB - Premix  Status:  Discontinued     1 g 100 mL/hr over 30 Minutes Intravenous Every 24 hours 09/30/14 1621 10/01/14 1114   09/30/14 1545  cefTRIAXone (ROCEPHIN) 1 g in dextrose 5 % 50 mL IVPB - Premix  Status:  Discontinued     1 g 100 mL/hr over 30 Minutes Intravenous Every 24 hours 09/30/14 1535 09/30/14 1621   09/30/14 1545  clindamycin (CLEOCIN) IVPB 600 mg  Status:  Discontinued     600 mg 100 mL/hr over 30 Minutes Intravenous 3 times per day 09/30/14 1535 10/10/14 1453   10/04/2014 1300  vancomycin (VANCOCIN) 1,250 mg in sodium chloride 0.9 % 250 mL IVPB  Status:  Discontinued     1,250 mg 166.7 mL/hr over 90 Minutes Intravenous Every 24 hours 09/14/2014 0514 09/30/2014 1200   10/04/2014 1300  vancomycin (VANCOCIN) IVPB 1000 mg/200 mL premix  Status:  Discontinued     1,000 mg 200 mL/hr over 60 Minutes Intravenous Every 24 hours 09/30/2014 1200 09/30/14 1535   09/10/2014 0600  piperacillin-tazobactam (ZOSYN) IVPB 3.375 g  Status:  Discontinued     3.375 g 12.5 mL/hr over 240 Minutes Intravenous 3 times per day 09/29/2014 0508 09/29/14 1019   09/30/2014 0045  vancomycin (VANCOCIN) IVPB 1000 mg/200 mL premix     1,000 mg 200 mL/hr over 60 Minutes Intravenous  Once 09/15/2014 0043 09/23/2014 0229   09/13/2014 0045  piperacillin-tazobactam (ZOSYN) IVPB 3.375 g     3.375 g 12.5 mL/hr  over 240 Minutes Intravenous  Once 09/19/2014 0043 09/13/2014 0251       Consults: Treatment Team:  Murlean Iba, MD Adrian Prows, MD Lloyd Huger, MD Josefine Class, MD Clyde Canterbury, MD    Studies: Ct Abdomen Pelvis Wo Contrast  10/01/2014   CLINICAL DATA:  Right flank cellulitis and edema.  EXAM: CT ABDOMEN AND PELVIS WITHOUT CONTRAST  TECHNIQUE: Multidetector CT imaging of the abdomen and pelvis was performed following the standard protocol without IV contrast.  COMPARISON:  08/31/2014  FINDINGS: Lower chest: Small bilateral pleural effusions are noted left greater than right. There is airspace consolidation noted within both lung bases. Patchy ground-glass and airspace opacity within the medial left upper lobe is noted, image number 3/series 4.  Hepatobiliary: There is no suspicious liver abnormality identified. Increased attenuation within the gallbladder may Roux represent stones and/or sludge. No biliary dilatation.  Pancreas: Normal appearance of the pancreas.  Spleen: The spleen is on unremarkable.  Adrenals/Urinary Tract: Normal appearance of the left adrenal gland. Similar appearance of low-attenuation right adrenal gland nodule measuring 2.9 cm, image 30/series 2. The kidneys are both on unremarkable. The urinary bladder is collapsed around a Foley catheter balloon.  Stomach/Bowel: Moderate distension of the stomach containing fluid level. The small bowel loops have a normal course and caliber. Normal appearance of the colon.  Vascular/Lymphatic: There is a right femoral dialysis catheter with tip in the IVC. Abdominal aorta has a normal caliber. No adenopathy.  Reproductive: Previous hysterectomy.  No adnexal mass.  Other: Moderate free fluid is identified within the abdomen and pelvis. There is diffuse body wall edema and skin thickening. No focal fluid collection identified to suggest abscess.  Musculoskeletal: Degenerative disc disease noted within the lumbar spine.   IMPRESSION: 1. Bilateral pleural effusions, ascites and anasarca is identified consistent with fluid overload state. 2.  Subcutaneous fat stranding and skin thickening may be related to anasarca or cellulitis. 3. No fluid collections identified. 4. Left lung opacities may reflect pneumonia and/r or areas of asymmetric edema.   Electronically Signed   By: Kerby Moors M.D.   On: 10/01/2014 15:17   Dg Chest 1 View  09/27/2014   CLINICAL DATA:  Central line placement.  EXAM: CHEST  1 VIEW  COMPARISON:  06/26/2014  FINDINGS: Right IJ central line with tip crossing midline and terminating in the region of the left brachiocephalic vein.  Hypoventilation. When accounting for this there is no cardiomegaly or change in aortic tortuosity. No pneumothorax, edema, effusion, or definitive pneumonia.  These results were called by telephone at the time of interpretation on 09/10/2014 at 3:14 am to [place] , who verbally acknowledged these results.  IMPRESSION: 1. Right IJ central line with tip crossing midline and projecting at the left brachiocephalic vein. No pneumothorax. 2. Hypoventilation.   Electronically Signed   By: Monte Fantasia M.D.   On: 09/27/2014 03:15   Ct Head Wo Contrast  09/15/2014   CLINICAL DATA:  Altered mental status after fall from couch. Posterior neck pain. Initial encounter.  EXAM: CT HEAD WITHOUT CONTRAST  CT CERVICAL SPINE WITHOUT CONTRAST  TECHNIQUE: Multidetector CT imaging of the head and cervical spine was performed following the standard protocol without intravenous contrast. Multiplanar CT image reconstructions of the cervical spine were also generated.  COMPARISON:  None.  FINDINGS: CT HEAD FINDINGS  Skull and Sinuses:Negative for fracture or destructive process. The mastoids, middle ears, and imaged paranasal sinuses are clear.  Orbits: No acute abnormality.  Brain: No evidence of acute infarction, hemorrhage, hydrocephalus, or mass lesion/mass effect.  CT CERVICAL SPINE FINDINGS   Thyroid gland has indistinct margins with mild surrounding fat stranding. A sub cm dense nodule is noted on the right. No surrounding adenopathy.  No evidence of acute fracture or traumatic malalignment. No gross cervical canal hematoma. There is degenerative disc disease and endplate spurring, most progressed at C4-5, C5-6, and C6-7. Spurring is most notable at C5-6 with right uncovertebral spurs causing advanced foraminal stenosis. A superimposed right paracentral disc herniation with vacuum phenomenon contributes to the C5-6 right foraminal stenosis.  IMPRESSION: 1. No evidence of intracranial or cervical spine injury. 2. Mid and lower cervical degenerative disc disease with foraminal stenoses, most advanced on the right at C5-6. 3. Edema noted around the thyroid gland, suspicious for thyroiditis. Correlate with endocrine history and thyroid function tests.   Electronically Signed   By: Monte Fantasia M.D.   On: 09/12/2014 02:31   Ct Cervical Spine Wo Contrast  09/29/2014   CLINICAL DATA:  Altered mental status after fall from couch. Posterior neck pain. Initial encounter.  EXAM: CT HEAD WITHOUT CONTRAST  CT CERVICAL SPINE WITHOUT CONTRAST  TECHNIQUE: Multidetector CT imaging of the head and cervical spine was performed following the standard protocol without intravenous contrast. Multiplanar CT image reconstructions of the cervical spine were also generated.  COMPARISON:  None.  FINDINGS: CT HEAD FINDINGS  Skull and Sinuses:Negative for fracture or destructive process. The mastoids, middle ears, and imaged paranasal sinuses are clear.  Orbits: No acute abnormality.  Brain: No evidence of acute infarction, hemorrhage, hydrocephalus, or mass lesion/mass effect.  CT CERVICAL SPINE FINDINGS  Thyroid gland has indistinct margins with mild surrounding fat stranding. A sub cm dense nodule is noted on the right. No surrounding adenopathy.  No evidence of acute fracture or traumatic malalignment. No gross  cervical  canal hematoma. There is degenerative disc disease and endplate spurring, most progressed at C4-5, C5-6, and C6-7. Spurring is most notable at C5-6 with right uncovertebral spurs causing advanced foraminal stenosis. A superimposed right paracentral disc herniation with vacuum phenomenon contributes to the C5-6 right foraminal stenosis.  IMPRESSION: 1. No evidence of intracranial or cervical spine injury. 2. Mid and lower cervical degenerative disc disease with foraminal stenoses, most advanced on the right at C5-6. 3. Edema noted around the thyroid gland, suspicious for thyroiditis. Correlate with endocrine history and thyroid function tests.   Electronically Signed   By: Monte Fantasia M.D.   On: 09/14/2014 02:31   Nm Cardiac Muga Rest  09/20/2014   CLINICAL DATA:  . Evaluate cardiac function in relation to chemotherapy.  EXAM: NUCLEAR MEDICINE CARDIAC BLOOD POOL IMAGING (MUGA)  TECHNIQUE: Cardiac multi-gated acquisition was performed at rest following intravenous injection of Tc-93m labeled red blood cells.  RADIOPHARMACEUTICALS:  21.9 mCi Technetium-29m in-vitro labeled red blood cells IV  COMPARISON:  None.  FINDINGS: No  focal wall motion abnormality of the left ventricle.  Calculated left ventricular ejection fraction equals 65%  IMPRESSION: Left ventricular ejection fraction equals 65%   Electronically Signed   By: Suzy Bouchard M.D.   On: 09/20/2014 09:31   Dg Chest Port 1 View  10/07/2014   CLINICAL DATA:  Respiratory failure/ventilator dependency.  EXAM: PORTABLE CHEST - 1 VIEW  COMPARISON:  10/03/2014  FINDINGS: Endotracheal tube tip 3.1 cm above the carina. Right internal jugular line tip: Upper SVC. Nasogastric tube enters the stomach.  Moderate enlargement of the cardiopericardial silhouette noted with bilateral increased airspace opacities.  Bilateral degenerative glenohumeral arthropathy. Tortuous thoracic aorta.  IMPRESSION: 1. Worsened bilateral airspace opacities. In the context of the  patient's moderate enlargement of the cardiopericardial silhouette, the appearance is suspicious for acute pulmonary edema. 2. The endotracheal tube has been successfully retracted, and the tubes and lines appear satisfactorily positioned.   Electronically Signed   By: Van Clines M.D.   On: 10/07/2014 08:42   Dg Chest Port 1 View  10/03/2014   CLINICAL DATA:  Orogastric and endotracheal tube placement.  EXAM: PORTABLE CHEST - 1 VIEW  COMPARISON:  10/03/2014 at 12:51 p.m.  FINDINGS: Orogastric tube passes below the diaphragm to curl within a mostly decompressed stomach.  Endotracheal tube tip projects the right mainstem bronchus.  Patchy airspace consolidation is noted in the peripheral right upper mid and lower lung and inferior to the right hilum.  Hazy left sided opacity noted previously has mostly resolved. There is mild residual opacity at the left lung base likely atelectasis. There is no pulmonary edema.  No pneumothorax.  IMPRESSION: 1. Endotracheal tube is now positioned. Tip is in the right mainstem bronchus. It will need to be retracted 3 cm for optimal positioning. Critical Value/emergent results were called by telephone at the time of interpretation on 10/03/2014 at 5:25 pm to the nurse in charge of this patient, who verbally acknowledged these results and will relay these results to respiratory. 2. Persistent right lung consolidation. Improved left lung aeration. 3. Orogastric tube well positioned curling within a mostly decompressed stomach.   Electronically Signed   By: Lajean Manes M.D.   On: 10/03/2014 17:26   Dg Chest Port 1 View  10/03/2014   CLINICAL DATA:  Status post central line adjustment  EXAM: PORTABLE CHEST - 1 VIEW  COMPARISON:  10/03/2014  FINDINGS: Cardiac shadow is stable. Increased density is noted over the  left hemi thorax consistent with a posterior fusion. The previously seen central line is been repositioned and now lies within the proximal superior vena cava. No  pneumothorax is noted. Patchy changes are seen within the lungs.  IMPRESSION: Patchy infiltrative changes are again identified bilaterally. Diffuse increased density is noted on the left which may be related to an evolving effusion. The central line has been repositioned without evidence of pneumothorax.   Electronically Signed   By: Inez Catalina M.D.   On: 10/03/2014 13:19   Dg Chest Port 1 View  10/03/2014   CLINICAL DATA:  Central line adjustment.  EXAM: PORTABLE CHEST - 1 VIEW  COMPARISON:  10/03/2014 at 10:18 a.m.  FINDINGS: Right sided central line curls overlying the right neck. This does not enter the thorax. It is not changed from the earlier study.  No change in the appearance of the lungs.  No pneumothorax.  IMPRESSION: Right central line is seen curling over the neck, not entering thorax and not changed from the prior study.   Electronically Signed   By: Lajean Manes M.D.   On: 10/03/2014 12:13   Dg Chest Port 1 View  10/03/2014   CLINICAL DATA:  Central line placement  EXAM: PORTABLE CHEST - 1 VIEW  COMPARISON:  None.  FINDINGS: Cardiomediastinal silhouette is stable. Patchy airspace is right upper lobe left midlung and left lower lobe again noted suspicious for multifocal pneumonia. There is only partially visualized tortuous central line in right neck region. Clinical correlation is necessary. There is no pneumothorax.  IMPRESSION: Again noted patchy airspace disease bilaterally suspicious for multifocal pneumonia. Tortuous only partially visualized central line in right neck region. Probable significant retracted right IJ line. Clinical correlation is necessary. These results were called by telephone at the time of interpretation on 10/03/2014 at 10:53 am to ICU. Nurse Sheryn Bison , who verbally acknowledged these results.   Electronically Signed   By: Lahoma Crocker M.D.   On: 10/03/2014 10:55   Dg Chest Port 1 View  10/02/2014   CLINICAL DATA:  Shortness of breath, acute onset. Initial encounter.   EXAM: PORTABLE CHEST - 1 VIEW  COMPARISON:  Chest radiograph performed 09/20/2014  FINDINGS: The lungs are hypoexpanded. Patchy bilateral airspace opacities are noted, raising concern for multifocal pneumonia. Underlying asymmetric interstitial edema on the left side cannot be excluded. A small left pleural effusion is suspected. No pneumothorax is seen.  The cardiomediastinal silhouette is mildly enlarged. No acute osseous abnormalities are seen. A right IJ line is noted ending overlying the left brachiocephalic vein.  IMPRESSION: 1. Lungs hypoexpanded. Patchy bilateral airspace opacities raise concern for multifocal pneumonia. 2. Underlying asymmetric interstitial edema on the left side cannot be excluded. Suspect small left pleural effusion. 3. Mild cardiomegaly noted. 4. Right IJ line noted ending overlying the left brachiocephalic vein, as noted on the prior study. It appears to have been retracted mildly since the prior study.   Electronically Signed   By: Garald Balding M.D.   On: 10/02/2014 20:31   Dg Chest Portable 1 View  09/05/2014   CLINICAL DATA:  Central line adjustment  EXAM: PORTABLE CHEST - 1 VIEW  COMPARISON:  Same day at 2:43 a.m.  FINDINGS: Shortening of the right IJ central line, but tip still terminates left of midline near the brachiocephalic vein.  Hypoventilation. Stable heart size and mediastinal contours, size accentuated by technique. There is interstitial crowding without edema, effusion, pneumothorax, or overt pneumonia.  IMPRESSION: 1. Despite right central line  shortening the tip still terminates near the left brachiocephalic vein. 2. Hypoventilation.   Electronically Signed   By: Monte Fantasia M.D.   On: 09/30/2014 04:28   Dg Abd Portable 1v  10/13/2014   CLINICAL DATA:  Ileus, sepsis and renal failure.  EXAM: PORTABLE ABDOMEN - 1 VIEW  COMPARISON:  10/03/2014  FINDINGS: Since a prior study there is improvement in ileus pattern with no significant gaseous distention of bowel  identified. A nasogastric tube remains extending into the distal stomach. A right groin temporary dialysis catheter extends into the lower IVC.  IMPRESSION: Improvement in ileus with no significant dilated bowel loops identified.   Electronically Signed   By: Aletta Edouard M.D.   On: 10/13/2014 14:02   Dg Abd Portable 1v  10/03/2014   CLINICAL DATA:  OG tube placement  EXAM: PORTABLE ABDOMEN - 1 VIEW  COMPARISON:  CT abdomen pelvis dated 09/23/2014  FINDINGS: Enteric tube terminates in the gastric cardia.  Nonobstructive bowel gas pattern.  IMPRESSION: Enteric tube terminates in the gastric cardia.   Electronically Signed   By: Julian Hy M.D.   On: 10/03/2014 17:22     Events:  Subjective:    Overnight Issues: no events overnight. Patient is on for a tracheostomy  Objective:  Vital signs for last 24 hours: Temp:  [96 F (35.6 C)-98.3 F (36.8 C)] 96.3 F (35.7 C) (06/11 1000) Pulse Rate:  [62-104] 85 (06/11 1130) Resp:  [11-32] 17 (06/11 1130) BP: (68-106)/(43-74) 93/62 mmHg (06/11 1130) SpO2:  [92 %-100 %] 97 % (06/11 1130) FiO2 (%):  [35 %] 35 % (06/11 1137) Weight:  [75.1 kg (165 lb 9.1 oz)] 75.1 kg (165 lb 9.1 oz) (06/11 0500)  Hemodynamic parameters for last 24 hours:    Intake/Output from previous day: 06/10 0701 - 06/11 0700 In: 3329.4 [I.V.:1249.4; IV Piggyback:400; TPN:1680] Out: 4813 [Urine:15; Emesis/NG output:650]  Intake/Output this shift: Total I/O In: 444 [I.V.:164; TPN:280] Out: 655 [Urine:5; Other:650]  Vent settings for last 24 hours: Vent Mode:  [-] SIMV FiO2 (%):  [35 %] 35 % Set Rate:  [8 bmp-15 bmp] 8 bmp Vt Set:  [500 mL] 500 mL PEEP:  [5 cmH20] 5 cmH20 Pressure Support:  [20 cmH20] 20 cmH20  Physical Exam:  Head atraumatic normocephalic Eyes PERRLA no incterus Throat: orally intubated Respiratory CTA bilaterally Cardio RRR s1s2 normal no g/r/m GI soft non-tender no rebound Extremities no C/C/C no edema Neuro flaccid  Results  for orders placed or performed during the hospital encounter of 09/04/2014 (from the past 24 hour(s))  Glucose, capillary     Status: Abnormal   Collection Time: 10/14/14 12:28 PM  Result Value Ref Range   Glucose-Capillary 190 (H) 65 - 99 mg/dL  Glucose, capillary     Status: Abnormal   Collection Time: 10/14/14  1:27 PM  Result Value Ref Range   Glucose-Capillary 219 (H) 65 - 99 mg/dL  Comprehensive metabolic panel     Status: Abnormal   Collection Time: 10/14/14  1:41 PM  Result Value Ref Range   Sodium 134 (L) 135 - 145 mmol/L   Potassium 4.4 3.5 - 5.1 mmol/L   Chloride 103 101 - 111 mmol/L   CO2 25 22 - 32 mmol/L   Glucose, Bld 220 (H) 65 - 99 mg/dL   BUN 32 (H) 6 - 20 mg/dL   Creatinine, Ser SEE COMMENTS 0.44 - 1.00 mg/dL   Calcium 8.5 (L) 8.9 - 10.3 mg/dL   Total Protein 5.8 (L)  6.5 - 8.1 g/dL   Albumin 1.8 (L) 3.5 - 5.0 g/dL   AST 362 (H) 15 - 41 U/L   ALT 107 (H) 14 - 54 U/L   Alkaline Phosphatase 1291 (H) 38 - 126 U/L   Total Bilirubin 27.7 (HH) 0.3 - 1.2 mg/dL   GFR calc non Af Amer NOT CALCULATED >60 mL/min   GFR calc Af Amer NOT CALCULATED >60 mL/min   Anion gap 6 5 - 15  Glucose, capillary     Status: Abnormal   Collection Time: 10/14/14  2:41 PM  Result Value Ref Range   Glucose-Capillary 164 (H) 65 - 99 mg/dL  Glucose, capillary     Status: Abnormal   Collection Time: 10/14/14  3:32 PM  Result Value Ref Range   Glucose-Capillary 148 (H) 65 - 99 mg/dL  Glucose, capillary     Status: Abnormal   Collection Time: 10/14/14  4:45 PM  Result Value Ref Range   Glucose-Capillary 137 (H) 65 - 99 mg/dL  Glucose, capillary     Status: Abnormal   Collection Time: 10/14/14  5:34 PM  Result Value Ref Range   Glucose-Capillary 160 (H) 65 - 99 mg/dL  Renal function     Status: Abnormal   Collection Time: 10/14/14  6:15 PM  Result Value Ref Range   Sodium 135 135 - 145 mmol/L   Potassium 4.0 3.5 - 5.1 mmol/L   Chloride 104 101 - 111 mmol/L   CO2 26 22 - 32 mmol/L    Glucose, Bld 170 (H) 65 - 99 mg/dL   BUN 29 (H) 6 - 20 mg/dL   Creatinine, Ser SEE COMMENTS 0.44 - 1.00 mg/dL   Calcium 8.6 (L) 8.9 - 10.3 mg/dL   Phosphorus SEE COMMENTS 2.5 - 4.6 mg/dL   Albumin 1.8 (L) 3.5 - 5.0 g/dL   GFR calc non Af Amer NOT CALCULATED >60 mL/min   GFR calc Af Amer NOT CALCULATED >60 mL/min   Anion gap 5 5 - 15  Glucose, capillary     Status: Abnormal   Collection Time: 10/14/14  6:45 PM  Result Value Ref Range   Glucose-Capillary 146 (H) 65 - 99 mg/dL  Glucose, capillary     Status: Abnormal   Collection Time: 10/14/14  7:27 PM  Result Value Ref Range   Glucose-Capillary 162 (H) 65 - 99 mg/dL  Glucose, capillary     Status: Abnormal   Collection Time: 10/14/14  8:37 PM  Result Value Ref Range   Glucose-Capillary 171 (H) 65 - 99 mg/dL  Renal function     Status: Abnormal   Collection Time: 10/14/14 11:45 PM  Result Value Ref Range   Sodium 136 135 - 145 mmol/L   Potassium 3.9 3.5 - 5.1 mmol/L   Chloride 102 101 - 111 mmol/L   CO2 27 22 - 32 mmol/L   Glucose, Bld 213 (H) 65 - 99 mg/dL   BUN 27 (H) 6 - 20 mg/dL   Creatinine, Ser SEE COMMENTS 0.44 - 1.00 mg/dL   Calcium 8.7 (L) 8.9 - 10.3 mg/dL   Phosphorus  2.5 - 4.6 mg/dL   Albumin 1.8 (L) 3.5 - 5.0 g/dL   GFR calc non Af Amer NOT CALCULATED >60 mL/min   GFR calc Af Amer NOT CALCULATED >60 mL/min   Anion gap 7 5 - 15  Glucose, capillary     Status: Abnormal   Collection Time: 10/14/14 11:46 PM  Result Value Ref Range   Glucose-Capillary 182 (H)  65 - 99 mg/dL   Comment 1 Notify RN   Magnesium     Status: None   Collection Time: 10/15/14  3:07 AM  Result Value Ref Range   Magnesium 1.8 1.7 - 2.4 mg/dL  APTT     Status: Abnormal   Collection Time: 10/15/14  3:07 AM  Result Value Ref Range   aPTT 51 (H) 24 - 36 seconds  CBC     Status: Abnormal   Collection Time: 10/15/14  3:07 AM  Result Value Ref Range   WBC 13.4 (H) 3.6 - 11.0 K/uL   RBC 2.83 (L) 3.80 - 5.20 MIL/uL   Hemoglobin 8.9 (L) 12.0 -  16.0 g/dL   HCT 27.3 (L) 35.0 - 47.0 %   MCV 96.3 80.0 - 100.0 fL   MCH 31.6 26.0 - 34.0 pg   MCHC 32.8 32.0 - 36.0 g/dL   RDW 21.3 (H) 11.5 - 14.5 %   Platelets 70 (L) 150 - 440 K/uL  Glucose, capillary     Status: Abnormal   Collection Time: 10/15/14  3:59 AM  Result Value Ref Range   Glucose-Capillary 194 (H) 65 - 99 mg/dL   Comment 1 Notify RN   Renal function     Status: Abnormal   Collection Time: 10/15/14  7:27 AM  Result Value Ref Range   Sodium 134 (L) 135 - 145 mmol/L   Potassium 3.4 (L) 3.5 - 5.1 mmol/L   Chloride 102 101 - 111 mmol/L   CO2 26 22 - 32 mmol/L   Glucose, Bld 224 (H) 65 - 99 mg/dL   BUN 25 (H) 6 - 20 mg/dL   Creatinine, Ser UNABLE TO REPORT DUE TO ICTERUS 0.44 - 1.00 mg/dL   Calcium 8.5 (L) 8.9 - 10.3 mg/dL   Phosphorus  2.5 - 4.6 mg/dL   Albumin 1.9 (L) 3.5 - 5.0 g/dL   GFR calc non Af Amer NOT CALCULATED >60 mL/min   GFR calc Af Amer NOT CALCULATED >60 mL/min   Anion gap 6 5 - 15  Glucose, capillary     Status: Abnormal   Collection Time: 10/15/14  7:34 AM  Result Value Ref Range   Glucose-Capillary 226 (H) 65 - 99 mg/dL  Glucose, capillary     Status: Abnormal   Collection Time: 10/15/14 11:04 AM  Result Value Ref Range   Glucose-Capillary 223 (H) 65 - 99 mg/dL     Assessment/Plan:   PULMONARY-Acute respiratory failure with hypoxia -will continue with full vent support -titrate FiO2 as tolerated -will titrate PEEP as tolerated -MDI as ordered -awaiting trach   CARDIO-Hypotension -still requiring pressors at this time -will titrate levophed keep MAP>65  RENAL -on CRRT presently  -renal on board  ID -will continue with antibiotics levaquin and erythromycin -cultures growing streptococcus  GI -on Pantoprazole  ENDO -Monitor FSBS -continue with insulin as ordered    LOS: 17 days    Critical Care Total Time: 35MIN  I have personally obtained a history, examined the patient, evaluated laboratory and imaging results,  formulated the assessment and plan and placed orders.  The Patient requires high complexity decision making for assessment and support, frequent evaluation and titration of therapies, application of advanced monitoring technologies and extensive interpretation of multiple databases.    *Care during the described time interval was provided by me and/or other providers on the critical care team.  I have reviewed this patient's available data, including medical history, events of note, physical examination and test results  as part of my evaluation.

## 2014-10-15 NOTE — Progress Notes (Signed)
Nutrition Follow-up  DOCUMENTATION CODES:     INTERVENTION:   (PN) Recommend continuing TPN at current rate to meet nutritional needs EN: recommend trial of EN once pt stable on vasopressors (dose being adjusted)  NUTRITION DIAGNOSIS:  Inadequate oral intake related to inability to eat as evidenced by NPO status, being addressed with TPN    GOAL:   (PN)  EN: initation of trophic feedings when clinically feasible  MONITOR:   (PN, EN, Electrolyte and renal profile, Anthropometric, Digestive system )  REASON FOR ASSESSMENT:  Consult  (RD Follow Up)  ASSESSMENT:  Pt remains on vent, CRRT. Trach on Monday  PN: TPN of 5% AA/15% dextrose continues at 4ml/hr  Digestive system: smear of stool this am.  Per RN Sarah yesterday afternoon 646ml output from OG tube once hooked back to suction, <28ml this am  Labs: Electrolyte and Renal Profile:  Recent Labs Lab 10/13/14 0439  10/14/14 0017 10/14/14 0443 10/14/14 0836  10/14/14 1815 10/14/14 2345 10/15/14 0307 10/15/14 0727  BUN 27*  < > 23* 24* 25*  < > 29* 27*  --  25*  CREATININE NOT VALID  < > SEE COMMENTS NOT VALID SEE COMMENTS  < > SEE COMMENTS SEE COMMENTS  --  UNABLE TO REPORT DUE TO ICTERUS  NA 138  < > SEE COMMENTS 136 135  < > 135 136  --  134*  K 4.6  < > 4.7 5.1 4.1  < > 4.0 3.9  --  3.4*  MG 1.7  --   --  1.9  --   --   --   --  1.8  --   PHOS SEE COMMENTS  --  SEE COMMENTS  --  SEE COMMENTS  --  SEE COMMENTS  --   --   --   < > = values in this interval not displayed.  Glucose Profile:  Recent Labs  10/15/14 0359 10/15/14 0734 10/15/14 1104  GLUCAP 194* 226* 223*     Nutritional Anemia Profile:  CBC Latest Ref Rng 10/15/2014 10/14/2014 10/13/2014  WBC 3.6 - 11.0 K/uL 13.4(H) 21.5(H) 20.6(H)  Hemoglobin 12.0 - 16.0 g/dL 8.9(L) 9.8(L) 9.8(L)  Hematocrit 35.0 - 47.0 % 27.3(L) 29.6(L) 29.8(L)  Platelets 150 - 440 K/uL 70(L) 71(L) 91(L)   Medications: vasopressin, levophed (20-30  mcg/min)  Height:  Ht Readings from Last 1 Encounters:  09/22/2014 5\' 1"  (1.549 m)    Weight:  Wt Readings from Last 1 Encounters:  10/15/14 165 lb 9.1 oz (75.1 kg)    Filed Weights   10/13/14 0200 10/14/14 0200 10/15/14 0500  Weight: 164 lb (74.39 kg) 173 lb 15.1 oz (78.9 kg) 165 lb 9.1 oz (75.1 kg)     BMI:  Body mass index is 31.3 kg/(m^2).  Estimated Nutritional Needs:  Kcal:  (11-14 kcals/d) Using actual wt of 95kg) 1050-1330 kcals/d  Protein:  (2.0-2.5 g/d) Using IbW of 48kg 96-120 g/d  Fluid:  (25-59ml/kg) Using IBW of 48kg 1200-1430ml/d  Skin:   (stage II pressure ulcer on buttock)  Diet Order:  Diet NPO time specified .TPN (CLINIMIX-E) Adult .TPN (CLINIMIX-E) Adult  EDUCATION NEEDS:  No education needs identified at this time   Intake/Output Summary (Last 24 hours) at 10/15/14 1217 Last data filed at 10/15/14 1100  Gross per 24 hour  Intake 3150.09 ml  Output   4825 ml  Net -1674.91 ml     HIGH Care Level Louvinia Cumbo B. Zenia Resides, [place], [place] (pager)

## 2014-10-15 NOTE — Progress Notes (Signed)
Notified Dr. Holley Raring (nephrology) about renal function panel results, based on potassium of 3.4, CRRT dialysate changed from 2K bags to 4K bags.

## 2014-10-15 NOTE — Progress Notes (Signed)
Subjective:  Remains very ill today. UOP only 15cc over the past 24 hours.  Dialysis catheter flowing well after exchange.  Remains on pressors and on the vent.  Lurline Idol being planned for Monday.  Objective:  Vital signs in last 24 hours:  Temp:  [96 F (35.6 C)-98.3 F (36.8 C)] 96.3 F (35.7 C) (06/11 1000) Pulse Rate:  [62-104] 82 (06/11 1115) Resp:  [11-32] 18 (06/11 1115) BP: (68-106)/(43-74) 74/56 mmHg (06/11 1115) SpO2:  [92 %-100 %] 97 % (06/11 1115) FiO2 (%):  [35 %] 35 % (06/11 0800) Weight:  [75.1 kg (165 lb 9.1 oz)] 75.1 kg (165 lb 9.1 oz) (06/11 0500)  Weight change: -3.8 kg (-8 lb 6 oz) Filed Weights   10/13/14 0200 10/14/14 0200 10/15/14 0500  Weight: 74.39 kg (164 lb) 78.9 kg (173 lb 15.1 oz) 75.1 kg (165 lb 9.1 oz)    Intake/Output: I/O last 3 completed shifts: In: 5200.9 [I.V.:2080.9; IV [id number]:371] Out: 6967 [Urine:15; Emesis/NG output:650; ELFYB:0175]     Physical Exam: General: Critically ill appearing  HEENT Scleral icterus noted, ETT in place, NGT to suction  Neck RIJ TLC  Pulm/lungs Decreased breath sounds at bases, vent assisted: FiO2 35%  CVS/Heart S1S2 no rubs  Abdomen:  distended, tense, decreased bowel sounds  Extremities: Skin wrinkling noted in b/l LE's trace edema  Neurologic: On the vent  Skin: Sites of mucosal bleeding in mouth noted  Access: Temp cath- rt femoral 5/25 Dr. Delana Meyer       Basic Metabolic Panel:  Recent Labs Lab 10/11/14 1025  10/12/14 8527  10/12/14 2305 10/13/14 7824  10/14/14 0017 10/14/14 2353 10/14/14 6144 10/14/14 1341 10/14/14 1815 10/14/14 2345 10/15/14 0307 10/15/14 0727  NA 137  < > 138  < > 137 138  < > SEE COMMENTS 136 135 134* 135 136  --  134*  K 3.5  < > 4.3  < > 4.8 4.6  < > 4.7 5.1 4.1 4.4 4.0 3.9  --  3.4*  CL 104  < > 102  < > 103 103  < > 104 105 103 103 104 102  --  102  CO2 29  < > 28  < > 28 27  < > 26 27 27 25 26 27   --  26  GLUCOSE 147*  < > 140*  < > 224* 151*  < > 191* 150*  187* 220* 170* 213*  --  224*  BUN 32*  < > 28*  < > 26* 27*  < > 23* 24* 25* 32* 29* 27*  --  25*  CREATININE SEE COMMENTS  < > SEE COMMENTS  < > UNABLE TO REPORT DUE TO ICTERUS NOT VALID  < > SEE COMMENTS NOT VALID SEE COMMENTS SEE COMMENTS SEE COMMENTS SEE COMMENTS  --  UNABLE TO REPORT DUE TO ICTERUS  CALCIUM 9.1  < > 9.6  < > 9.0 9.3  < > 9.0 8.8* 8.5* 8.5* 8.6* 8.7*  --  8.5*  MG 1.7  --  1.7  --   --  1.7  --   --  1.9  --   --   --   --  1.8  --   PHOS  --   < >  --   < > UNABLE TO REPORT DUE TO ICTERUS SEE COMMENTS  --  SEE COMMENTS  --  SEE COMMENTS  --  SEE COMMENTS  --   --   --   < > =  values in this interval not displayed.   CBC:  Recent Labs Lab 10/11/14 0555 10/12/14 0444 10/13/14 0439 10/14/14 0443 10/15/14 0307  WBC 17.3* 20.8* 20.6* 21.5* 13.4*  HGB 9.5* 9.4* 9.8* 9.8* 8.9*  HCT 27.7* 27.9* 29.8* 29.6* 27.3*  MCV 91.3 91.4 93.6 95.8 96.3  PLT 86* 91* 91* 71* 70*      Microbiology: Results for orders placed or performed during the hospital encounter of 09/22/2014  Blood culture (routine x 2)     Status: None   Collection Time: 09/07/2014 12:45 AM  Result Value Ref Range Status   Specimen Description BLOOD  Final   Special Requests NONE  Final   Culture  Setup Time   Final    GRAM POSITIVE COCCI IN CHAINS IN BOTH AEROBIC AND ANAEROBIC BOTTLES CRITICAL RESULT CALLED TO, READ BACK BY AND VERIFIED WITH: PAM CRAWFORD AT 1406 09/06/2014 DV    Culture STREPTOCOCCUS SPECIES  Final   Report Status 10/01/2014 FINAL  Final   Organism ID, Bacteria STREPTOCOCCUS SPECIES  Final      Susceptibility   Streptococcus species - MIC (ETEST)*    ERYTHROMYCIN Value in next row Resistant      RESISTANT6    PENICILLIN Value in next row Intermediate      INTERMEDIATE0.25    CEFTRIAXONE Value in next row Sensitive      SENSITIVE0.047    VANCOMYCIN Value in next row Sensitive      SENSITIVE1.0    LEVOFLOXACIN Value in next row Sensitive      SENSITIVE1.0    * STREPTOCOCCUS  SPECIES  Blood culture (routine x 2)     Status: None   Collection Time: 09/08/2014 12:45 AM  Result Value Ref Range Status   Specimen Description BLOOD  Final   Special Requests NONE  Final   Culture  Setup Time   Final    GRAM POSITIVE COCCI IN CHAINS IN BOTH AEROBIC AND ANAEROBIC BOTTLES CRITICAL RESULT CALLED TO, READ BACK BY AND VERIFIED WITH: PAM CRAWFORD AT 1406 09/09/2014 DV    Culture   Final    STREPTOCOCCUS SPECIES REFER TO ACCESSION [id number] COLLECTED ON 09/06/2014 AT 0045 FOR SENSITIVITIES    Report Status 09/30/2014 FINAL  Final  Culture, blood (routine x 2)     Status: None   Collection Time: 10/04/14 11:50 AM  Result Value Ref Range Status   Specimen Description BLOOD  Final   Special Requests NONE  Final   Culture NO GROWTH 5 DAYS  Final   Report Status 10/09/2014 FINAL  Final  Culture, blood (routine x 2)     Status: None   Collection Time: 10/04/14 12:00 PM  Result Value Ref Range Status   Specimen Description BLOOD  Final   Special Requests NONE  Final   Culture NO GROWTH 5 DAYS  Final   Report Status 10/09/2014 FINAL  Final  Culture, respiratory (NON-Expectorated)     Status: None   Collection Time: 10/04/14  2:00 PM  Result Value Ref Range Status   Specimen Description INDUCED SPUTUM  Final   Special Requests Normal  Final   Gram Stain   Final    EXCELLENT SPECIMEN - 90-100% WBCS MANY WBC SEEN MANY YEAST    Culture MODERATE GROWTH CANDIDA ALBICANS  Final   Report Status 10/07/2014 FINAL  Final  Culture, blood (routine x 2)     Status: None (Preliminary result)   Collection Time: 10/12/14  1:34 PM  Result Value Ref Range  Status   Specimen Description BLOOD  Final   Special Requests Normal  Final   Culture NO GROWTH 3 DAYS  Final   Report Status PENDING  Incomplete  Culture, blood (routine x 2)     Status: None (Preliminary result)   Collection Time: 10/12/14  4:30 PM  Result Value Ref Range Status   Specimen Description BLOOD  Final   Special Requests  Normal  Final   Culture NO GROWTH 3 DAYS  Final   Report Status PENDING  Incomplete    Coagulation Studies:  Recent Labs  10/13/14 0439 10/14/14 0443  LABPROT 23.1* 22.8*  INR 2.03 2.00    Urinalysis: No results for input(s): COLORURINE, LABSPEC, PHURINE, GLUCOSEU, HGBUR, BILIRUBINUR, KETONESUR, PROTEINUR, UROBILINOGEN, NITRITE, LEUKOCYTESUR in the last 72 hours.  Invalid input(s): APPERANCEUR    Imaging: Dg Abd Portable 1v  10/13/2014   CLINICAL DATA:  Ileus, sepsis and renal failure.  EXAM: PORTABLE ABDOMEN - 1 VIEW  COMPARISON:  10/03/2014  FINDINGS: Since a prior study there is improvement in ileus pattern with no significant gaseous distention of bowel identified. A nasogastric tube remains extending into the distal stomach. A right groin temporary dialysis catheter extends into the lower IVC.  IMPRESSION: Improvement in ileus with no significant dilated bowel loops identified.   Electronically Signed   By: Aletta Edouard M.D.   On: 10/13/2014 14:02     Medications:   . Marland KitchenTPN (CLINIMIX-E) Adult 70 mL/hr at 10/14/14 1900  . Marland KitchenTPN (CLINIMIX-E) Adult    . fentaNYL infusion INTRAVENOUS 100 mcg/hr (10/15/14 0800)  . insulin (NOVOLIN-R) infusion    . norepinephrine (LEVOPHED) Adult infusion 28 mcg/min (10/15/14 1115)  . pureflow    . pureflow 2,500 mL/hr at 10/15/14 0851  . vasopressin (PITRESSIN) infusion - *FOR SHOCK* 0.03 Units/min (10/15/14 0700)   . antiseptic oral rinse  7 mL Mouth Rinse QID  . budesonide (PULMICORT) nebulizer solution  0.5 mg Nebulization BID  . chlorhexidine  15 mL Mouth Rinse BID  . erythromycin  250 mg Intravenous 3 times per day  . ipratropium-albuterol  3 mL Nebulization QID  . lactulose  20 g Oral BID  . levofloxacin (LEVAQUIN) IV  500 mg Intravenous Q24H  . midodrine  10 mg Oral TID  . pantoprazole (PROTONIX) IV  40 mg Intravenous Q12H  . sodium chloride  10-40 mL Intracatheter Q12H  . sucralfate  1 g Oral 4 times per day   acetaminophen  **OR** acetaminophen, albuterol, heparin, polyethylene glycol powder, sodium chloride  Assessment/ Plan:   64 y.o. African [place] female with diabetes mellitus type 2, hypertension, morbid obesity, osteoarthritis, status post right knee surgery, Amyloidosis with nephrotic syndrome with edema, hypoalbuminemia, renal insufficiency and proteinuria.  1. Acute Renal Failure with chronic kidney disease stage III with proteinuria with nephrotic syndrome secondary Renal Amyloidosis. Now progression to End Stage Renal Disease. Anuric on Continuous Renal Replacement Therapy.  - K bath switched to 4k today as K did drop to 3.4. Will continue CRRT with current UF rate, will consider decreasing tomorrow.  2. Sepsis: Strep in blood cultures 5/25: afebrile. Repeat blood cultures with no growth.  - remains on pressors as well as levofloxacin and erythromycin.  Will continue.  3. Hepatic Failure: Bilirubin in the 30s, albumin 2.1, platelets 71,000 and INR elevated at 2.  - overall hepatic function seems to be deteriorating.   4. Amyloidosis: currently not a candidate for immunotherapy or chemotherapy.  - Appreciate oncology input.   5.  Respiratory failure:  Lurline Idol being planned for Monday.  LOS: [place], Blandon Offerdahl 6/11/201611:21 AM

## 2014-10-16 LAB — GLUCOSE, CAPILLARY
GLUCOSE-CAPILLARY: 194 mg/dL — AB (ref 65–99)
GLUCOSE-CAPILLARY: 141 mg/dL — AB (ref 65–99)
GLUCOSE-CAPILLARY: 214 mg/dL — AB (ref 65–99)
GLUCOSE-CAPILLARY: 210 mg/dL — AB (ref 65–99)
GLUCOSE-CAPILLARY: 210 mg/dL — AB (ref 65–99)
GLUCOSE-CAPILLARY: 178 mg/dL — AB (ref 65–99)
Glucose-Capillary: 100 mg/dL — ABNORMAL HIGH (ref 65–99)
Glucose-Capillary: 127 mg/dL — ABNORMAL HIGH (ref 65–99)
Glucose-Capillary: 131 mg/dL — ABNORMAL HIGH (ref 65–99)
Glucose-Capillary: 135 mg/dL — ABNORMAL HIGH (ref 65–99)
Glucose-Capillary: 142 mg/dL — ABNORMAL HIGH (ref 65–99)
Glucose-Capillary: 162 mg/dL — ABNORMAL HIGH (ref 65–99)
Glucose-Capillary: 166 mg/dL — ABNORMAL HIGH (ref 65–99)
Glucose-Capillary: 183 mg/dL — ABNORMAL HIGH (ref 65–99)
Glucose-Capillary: 190 mg/dL — ABNORMAL HIGH (ref 65–99)
Glucose-Capillary: 229 mg/dL — ABNORMAL HIGH (ref 65–99)
Glucose-Capillary: 91 mg/dL (ref 65–99)
Glucose-Capillary: 94 mg/dL (ref 65–99)

## 2014-10-16 LAB — RENAL FUNCTION PANEL
ALBUMIN: 1.9 g/dL — AB (ref 3.5–5.0)
Albumin: 1.8 g/dL — ABNORMAL LOW (ref 3.5–5.0)
Albumin: 1.7 g/dL — ABNORMAL LOW (ref 3.5–5.0)
Anion gap: 5 (ref 5–15)
Anion gap: 4 — ABNORMAL LOW (ref 5–15)
Anion gap: 6 (ref 5–15)
BUN: 22 mg/dL — ABNORMAL HIGH (ref 6–20)
BUN: 23 mg/dL — AB (ref 6–20)
BUN: 25 mg/dL — ABNORMAL HIGH (ref 6–20)
CALCIUM: 8.7 mg/dL — AB (ref 8.9–10.3)
CALCIUM: 8.3 mg/dL — AB (ref 8.9–10.3)
CHLORIDE: 103 mmol/L (ref 101–111)
CO2: 28 mmol/L (ref 22–32)
CO2: 24 mmol/L (ref 22–32)
CO2: 27 mmol/L (ref 22–32)
Calcium: 8.6 mg/dL — ABNORMAL LOW (ref 8.9–10.3)
Chloride: 103 mmol/L (ref 101–111)
Chloride: 104 mmol/L (ref 101–111)
Glucose, Bld: 153 mg/dL — ABNORMAL HIGH (ref 65–99)
Glucose, Bld: 239 mg/dL — ABNORMAL HIGH (ref 65–99)
Glucose, Bld: 257 mg/dL — ABNORMAL HIGH (ref 65–99)
POTASSIUM: 3.9 mmol/L (ref 3.5–5.1)
Potassium: 4.4 mmol/L (ref 3.5–5.1)
Potassium: 4.4 mmol/L (ref 3.5–5.1)
Sodium: 136 mmol/L (ref 135–145)
Sodium: 134 mmol/L — ABNORMAL LOW (ref 135–145)
Sodium: 134 mmol/L — ABNORMAL LOW (ref 135–145)

## 2014-10-16 LAB — CBC
HCT: 31.2 % — ABNORMAL LOW (ref 35.0–47.0)
Hemoglobin: 10.2 g/dL — ABNORMAL LOW (ref 12.0–16.0)
MCH: 31.6 pg (ref 26.0–34.0)
MCHC: 32.5 g/dL (ref 32.0–36.0)
MCV: 97.2 fL (ref 80.0–100.0)
PLATELETS: 72 10*3/uL — AB (ref 150–440)
RBC: 3.21 MIL/uL — AB (ref 3.80–5.20)
RDW: 21.4 % — ABNORMAL HIGH (ref 11.5–14.5)
WBC: 12 10*3/uL — AB (ref 3.6–11.0)

## 2014-10-16 LAB — BASIC METABOLIC PANEL
Anion gap: 5 (ref 5–15)
BUN: 23 mg/dL — ABNORMAL HIGH (ref 6–20)
CALCIUM: 8.8 mg/dL — AB (ref 8.9–10.3)
CO2: 25 mmol/L (ref 22–32)
Chloride: 106 mmol/L (ref 101–111)
GLUCOSE: 132 mg/dL — AB (ref 65–99)
POTASSIUM: 4.3 mmol/L (ref 3.5–5.1)
SODIUM: 136 mmol/L (ref 135–145)

## 2014-10-16 LAB — APTT: APTT: 48 s — AB (ref 24–36)

## 2014-10-16 LAB — MAGNESIUM: MAGNESIUM: 1.7 mg/dL (ref 1.7–2.4)

## 2014-10-16 MED ORDER — DEXTROSE 10 % IV SOLN: INTRAVENOUS | Status: DC | PRN

## 2014-10-16 MED ORDER — INSULIN GLARGINE 100 UNIT/ML ~~LOC~~ SOLN
10.0000 [IU] | SUBCUTANEOUS | Status: DC
  Administered 2014-10-16: 10 [IU] via SUBCUTANEOUS
  Filled 2014-10-16 (×2): qty 0.1

## 2014-10-16 MED ORDER — SODIUM CHLORIDE 0.9 % IV SOLN: Freq: Once | INTRAVENOUS | Status: DC

## 2014-10-16 MED ORDER — TRACE MINERALS CR-CU-MN-SE-ZN 10-1000-500-60 MCG/ML IV SOLN
INTRAVENOUS | Status: AC
  Filled 2014-10-16: qty 1680

## 2014-10-16 MED ORDER — INSULIN ASPART 100 UNIT/ML ~~LOC~~ SOLN
1.0000 [IU] | SUBCUTANEOUS | Status: DC
  Administered 2014-10-16: 3 [IU] via SUBCUTANEOUS
  Administered 2014-10-16: 1 [IU] via SUBCUTANEOUS
  Filled 2014-10-16: qty 3
  Filled 2014-10-16: qty 1

## 2014-10-16 MED ORDER — SODIUM CHLORIDE 0.9 % IV SOLN
INTRAVENOUS | Status: DC
  Administered 2014-10-16: 1.5 [IU]/h via INTRAVENOUS
  Filled 2014-10-16 (×2): qty 2.5

## 2014-10-16 NOTE — Plan of Care (Signed)
Problem: Phase I Progression Outcomes Goal: Sedation Protocol initiated if indicated Outcome: Progressing Pt remains comfortable on fentanyl drip, RASS -1.  Pt able to awaken and follow simple commands at times

## 2014-10-16 NOTE — Progress Notes (Signed)
Subjective:  Weight continues to decline. Pressor   Objective:  Vital signs in last 24 hours:  Temp:  [96.7 F (35.9 C)-98.4 F (36.9 C)] 97.6 F (36.4 C) (06/12 1120) Pulse Rate:  [83-91] 91 (06/12 1200) Resp:  [12-23] 21 (06/12 1200) BP: (72-111)/(34-87) 87/61 mmHg (06/12 1200) SpO2:  [93 %-100 %] 96 % (06/12 1200) FiO2 (%):  [35 %] 35 % (06/12 1000) Weight:  [73.9 kg (162 lb 14.7 oz)] 73.9 kg (162 lb 14.7 oz) (06/12 0430)  Weight change: -1.2 kg (-2 lb 10.3 oz) Filed Weights   10/14/14 0200 10/15/14 0500 10/16/14 0430  Weight: 78.9 kg (173 lb 15.1 oz) 75.1 kg (165 lb 9.1 oz) 73.9 kg (162 lb 14.7 oz)    Intake/Output: I/O last 3 completed shifts: In: 5097.9 [I.V.:1719.7; NG/GT:250; IV [id number]:517] Out: 0017 [Urine:23; Emesis/NG output:850; Other:6300]     Physical Exam: General: Critically ill appearing  HEENT Scleral icterus noted, ETT in place, NGT to suction  Neck RIJ TLC  Pulm/lungs Decreased breath sounds at bases, vent assisted: FiO2 35%  CVS/Heart S1S2 no rubs  Abdomen:  distended, tense, decreased bowel sounds  Extremities: Skin wrinkling noted in b/l LE's trace edema  Neurologic: On the vent  Skin: Bleeding from lips noted  Access: Temp cath- rt femoral 5/25 Dr. Delana Meyer       Basic Metabolic Panel:  Recent Labs Lab 10/12/14 0444  10/13/14 4944  10/14/14 0017 10/14/14 9675 10/14/14 9163  10/14/14 1815  10/15/14 8466 10/15/14 5993 10/15/14 1534 10/16/14 0002 10/16/14 0413 10/16/14 0754  NA 138  < > 138  < > SEE COMMENTS 136 135  < > 135  < >  --  134* 135 134* 136 136  K 4.3  < > 4.6  < > 4.7 5.1 4.1  < > 4.0  < >  --  3.4* 3.6 3.9 4.3 4.4  CL 102  < > 103  < > 104 105 103  < > 104  < >  --  102 102 103 106 103  CO2 28  < > 27  < > 26 27 27   < > 26  < >  --  26 26 27 25 28   GLUCOSE 140*  < > 151*  < > 191* 150* 187*  < > 170*  < >  --  224* 121* 239* 132* 153*  BUN 28*  < > 27*  < > 23* 24* 25*  < > 29*  < >  --  25* 24* 23* 23* 22*   CREATININE SEE COMMENTS  < > NOT VALID  < > SEE COMMENTS NOT VALID SEE COMMENTS  < > SEE COMMENTS  < >  --  UNABLE TO REPORT DUE TO ICTERUS SEE COMMENTS SEE COMMENTS SEE COMMENTS SEE COMMENTS  CALCIUM 9.6  < > 9.3  < > 9.0 8.8* 8.5*  < > 8.6*  < >  --  8.5* 8.6* 8.6* 8.8* 8.7*  MG 1.7  --  1.7  --   --  1.9  --   --   --   --  1.8  --   --   --  1.7  --   PHOS  --   < > SEE COMMENTS  --  SEE COMMENTS  --  SEE COMMENTS  --  SEE COMMENTS  --   --   --  SEE COMMENTS  --   --  SEE COMMENTS  < > = values in this interval not  displayed.   CBC:  Recent Labs Lab 10/12/14 0444 10/13/14 0439 10/14/14 0443 10/15/14 0307 10/16/14 0413  WBC 20.8* 20.6* 21.5* 13.4* 12.0*  HGB 9.4* 9.8* 9.8* 8.9* 10.2*  HCT 27.9* 29.8* 29.6* 27.3* 31.2*  MCV 91.4 93.6 95.8 96.3 97.2  PLT 91* 91* 71* 70* 72*      Microbiology: Results for orders placed or performed during the hospital encounter of 09/23/2014  Blood culture (routine x 2)     Status: None   Collection Time: 09/24/2014 12:45 AM  Result Value Ref Range Status   Specimen Description BLOOD  Final   Special Requests NONE  Final   Culture  Setup Time   Final    GRAM POSITIVE COCCI IN CHAINS IN BOTH AEROBIC AND ANAEROBIC BOTTLES CRITICAL RESULT CALLED TO, READ BACK BY AND VERIFIED WITH: PAM CRAWFORD AT 1406 10/03/2014 DV    Culture STREPTOCOCCUS SPECIES  Final   Report Status 10/01/2014 FINAL  Final   Organism ID, Bacteria STREPTOCOCCUS SPECIES  Final      Susceptibility   Streptococcus species - MIC (ETEST)*    ERYTHROMYCIN Value in next row Resistant      RESISTANT6    PENICILLIN Value in next row Intermediate      INTERMEDIATE0.25    CEFTRIAXONE Value in next row Sensitive      SENSITIVE0.047    VANCOMYCIN Value in next row Sensitive      SENSITIVE1.0    LEVOFLOXACIN Value in next row Sensitive      SENSITIVE1.0    * STREPTOCOCCUS SPECIES  Blood culture (routine x 2)     Status: None   Collection Time: 09/09/2014 12:45 AM  Result Value Ref  Range Status   Specimen Description BLOOD  Final   Special Requests NONE  Final   Culture  Setup Time   Final    GRAM POSITIVE COCCI IN CHAINS IN BOTH AEROBIC AND ANAEROBIC BOTTLES CRITICAL RESULT CALLED TO, READ BACK BY AND VERIFIED WITH: PAM CRAWFORD AT 1406 10/01/2014 DV    Culture   Final    STREPTOCOCCUS SPECIES REFER TO ACCESSION [id number] COLLECTED ON 09/18/2014 AT 0045 FOR SENSITIVITIES    Report Status 09/30/2014 FINAL  Final  Culture, blood (routine x 2)     Status: None   Collection Time: 10/04/14 11:50 AM  Result Value Ref Range Status   Specimen Description BLOOD  Final   Special Requests NONE  Final   Culture NO GROWTH 5 DAYS  Final   Report Status 10/09/2014 FINAL  Final  Culture, blood (routine x 2)     Status: None   Collection Time: 10/04/14 12:00 PM  Result Value Ref Range Status   Specimen Description BLOOD  Final   Special Requests NONE  Final   Culture NO GROWTH 5 DAYS  Final   Report Status 10/09/2014 FINAL  Final  Culture, respiratory (NON-Expectorated)     Status: None   Collection Time: 10/04/14  2:00 PM  Result Value Ref Range Status   Specimen Description INDUCED SPUTUM  Final   Special Requests Normal  Final   Gram Stain   Final    EXCELLENT SPECIMEN - 90-100% WBCS MANY WBC SEEN MANY YEAST    Culture MODERATE GROWTH CANDIDA ALBICANS  Final   Report Status 10/07/2014 FINAL  Final  Culture, blood (routine x 2)     Status: None (Preliminary result)   Collection Time: 10/12/14  1:34 PM  Result Value Ref Range Status   Specimen Description  BLOOD  Final   Special Requests Normal  Final   Culture NO GROWTH 4 DAYS  Final   Report Status PENDING  Incomplete  Culture, blood (routine x 2)     Status: None (Preliminary result)   Collection Time: 10/12/14  4:30 PM  Result Value Ref Range Status   Specimen Description BLOOD  Final   Special Requests Normal  Final   Culture NO GROWTH 4 DAYS  Final   Report Status PENDING  Incomplete    Coagulation  Studies:  Recent Labs  10/14/14 0443  LABPROT 22.8*  INR 2.00    Urinalysis: No results for input(s): COLORURINE, LABSPEC, PHURINE, GLUCOSEU, HGBUR, BILIRUBINUR, KETONESUR, PROTEINUR, UROBILINOGEN, NITRITE, LEUKOCYTESUR in the last 72 hours.  Invalid input(s): APPERANCEUR    Imaging: No results found.   Medications:   . Marland KitchenTPN (CLINIMIX-E) Adult    . Marland KitchenTPN (CLINIMIX-E) Adult    . dextrose    . fentaNYL infusion INTRAVENOUS 100 mcg/hr (10/16/14 1200)  . norepinephrine (LEVOPHED) Adult infusion 42.027 mcg/min (10/16/14 1200)  . pureflow 2,500 mL/hr at 10/16/14 0930  . vasopressin (PITRESSIN) infusion - *FOR SHOCK* 0.03 Units/min (10/16/14 1200)   . sodium chloride   Intravenous Once  . antiseptic oral rinse  7 mL Mouth Rinse QID  . budesonide (PULMICORT) nebulizer solution  0.5 mg Nebulization BID  . chlorhexidine  15 mL Mouth Rinse BID  . erythromycin  250 mg Intravenous 3 times per day  . insulin aspart  1-3 Units Subcutaneous 6 times per day  . insulin glargine  10 Units Subcutaneous Q24H  . ipratropium-albuterol  3 mL Nebulization QID  . lactulose  20 g Oral BID  . levofloxacin (LEVAQUIN) IV  500 mg Intravenous Q24H  . midodrine  10 mg Oral TID  . pantoprazole (PROTONIX) IV  40 mg Intravenous Q12H  . sodium chloride  10-40 mL Intracatheter Q12H  . sucralfate  1 g Oral 4 times per day   acetaminophen **OR** acetaminophen, albuterol, dextrose, heparin, polyethylene glycol powder, sodium chloride  Assessment/ Plan:   64 y.o. African [place] female with diabetes mellitus type 2, hypertension, morbid obesity, osteoarthritis, status post right knee surgery, Amyloidosis with nephrotic syndrome with edema, hypoalbuminemia, renal insufficiency and proteinuria.  1. Acute Renal Failure with chronic kidney disease stage III with proteinuria with nephrotic syndrome secondary Renal Amyloidosis. Now progression to End Stage Renal Disease. Anuric on Continuous Renal Replacement  Therapy.  - K bath switched to 4k, K up to 4.4 today.  Continue CRRT at present.   Pressor requirement up.  2. Sepsis: Strep in blood cultures 5/25: afebrile. Repeat blood cultures with no growth.  - continue levofloxacin and erythromycin.  3. Hepatic Failure: LFTs at last check (6/10) still elevated.  Albumin remains low at 1.8.  Continue TPN.  4. Amyloidosis: currently not a candidate for immunotherapy or chemotherapy.  - Appreciate oncology input.   5.  Respiratory failure:  Trach being planned for Monday.  LOS: [place], Dameon Soltis 6/12/201612:06 PM

## 2014-10-16 NOTE — Plan of Care (Signed)
Problem: Phase I Progression Outcomes Goal: Initiate hyperglycemia protocol as indicated Outcome: Progressing Pt on hyperglycemia protocol, pt has been converted off insulin drip and then drip having to be restarted per protocol

## 2014-10-16 NOTE — Progress Notes (Signed)
[facility] NAME: Emillee Talsma    MR#:  [id number]  DATE OF BIRTH:  04/15/1951  SUBJECTIVE:  CHIEF COMPLAINT:   Chief Complaint  Patient presents with  . Altered Mental Status    Critically ill. On pressors. CRRT. Full vent support. Sedated. TPN  REVIEW OF SYSTEMS:    ROS  Unable to give history due to  patient intubated   DRUG ALLERGIES:  No Known Allergies  VITALS:  Blood pressure 111/69, pulse 90, temperature 97.6 F (36.4 C), temperature source Oral, resp. rate 16, height 5\' 1"  (1.549 m), weight 73.9 kg (162 lb 14.7 oz), SpO2 95 %.  PHYSICAL EXAMINATION:   Physical Exam  GENERAL:  64 y.o.-year-old patient lying in the bed critically ill EYES: Pupils equal, round, reactive to light and accommodation. + scleral icterus.  HEENT: Head atraumatic, normocephalic.Intubated NECK:  Supple, no jugular venous distention. No thyroid enlargement, no tenderness. Right IJ . Right femoral Dialysis catheter. LUNGS: Coarse breath sounds, poor air movement, vent CARDIOVASCULAR: S1, S2 normal. No murmurs. ABDOMEN: Soft, nontender, nondistended. Bowel sounds decreased. No organomegaly or mass. EXTREMITIES: No cyanosis, clubbing. Anasarca. NEUROLOGIC:  Sedated. SKIN:  Superficial ulcer right thigh. Decubitus ulcer   LABORATORY PANEL:   CBC  Recent Labs Lab 10/16/14 0413  WBC 12.0*  HGB 10.2*  HCT 31.2*  PLT 72*   ------------------------------------------------------------------------------------------------------------------  Chemistries   Recent Labs Lab 10/14/14 1341  10/16/14 0413 10/16/14 0754  NA 134*  < > 136 136  K 4.4  < > 4.3 4.4  CL 103  < > 106 103  CO2 25  < > 25 28  GLUCOSE 220*  < > 132* 153*  BUN 32*  < > 23* 22*  CREATININE SEE COMMENTS  < > SEE COMMENTS SEE COMMENTS  CALCIUM 8.5*  < > 8.8* 8.7*  MG  --   < > 1.7  --   AST 362*  --   --   --   ALT 107*  --   --   --   ALKPHOS 1291*   --   --   --   BILITOT 27.7*  --   --   --   < > = values in this interval not displayed. ------------------------------------------------------------------------------------------------------------------   RADIOLOGY:  Dg Chest Port 1 View  10/07/2014   IMPRESSION: 1. Worsened bilateral airspace opacities. In the context of the patient's moderate enlargement of the cardiopericardial silhouette, the appearance is suspicious for acute pulmonary edema. 2. The endotracheal tube has been successfully retracted, and the tubes and lines appear satisfactorily positioned.   Electronically Signed   By: Van Clines M.D.   On: 10/07/2014 08:42     ASSESSMENT AND PLAN:   64 year old African American female history of nephrotic syndrome, amyloidosis, essential hypertension presenting on 5/25 with altered mental status/confusion after apparently falling out of bed. Admitted for septic shock  * Septic shock with right flank/thigh cellulitis with streptococcus bacteremia - ID following - Changed to Levaquin till November 01, 2014 and clindamycin stopped. On erythromycin, Continue pressors. Off steroids. - Echo- No vegetations. Repeat blood cultures negative to date.  * Acute renal failure over CKD3 due to ATN. From septic shock in the setting of amyloidosis On CRRT. Appreciate nephrology help. Severe hypoalbuminemia. Will likely progress to ESRD.  * Acute liver failure- Due to sepsis - No improvement.  CT abd/pelvis showed no biliary dilation or obstruction Severe hyperbilirubinemia > 30. Gastroenterology following.  lactulose  Have coagulopathy due to that- makes her even higher risk for surgery.  * Ileus Improving on Xray But significant OP in NG still. Continue LIS TFs when able to tolerate.  * Elevated INR - Due to liver failure. DIC unlikely per oncology. 4 units FFP given 6/1 minimal improvement in INR  FFP on Monday early morning- for surgery in morning  * Acute encephalopathy - Due to  acute illness. CT head nothing acute.  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 Plan was for chemo as OP with Dr. Grayland Ormond but now on hold due to acute illness. Oncology following  * Upper GI bleed with Acute blood loss  anemia over AOCD EGD showed severe esophagitis. On protonix  IV Transfused 4 units this admission.  * Type 2 diabetes, uncomplicated: Hold oral agents, at insulin slide scale every 6 hours Accu-Chek  * Essential hypertension: Held all meds  * Venous thromboembolism prophylactic: Heparin subcutaneous stopped due to high INR and low platelets and GIB  * Nutrition TPN  * Acute respiratory failure -Full vent support - due to suspected aspiration of gastrointestinal content - Pulmonology following - Full ventilator support  * Trach and PEG ENT consulted. check INR. FFP prior to procedure. Will be high risk for any procedure with co-morbidities. Scheduled for Monday morning.  * Decubitus ulcer  CODE STATUS: FULL CODE  Multi organ failure. High risk for cardiac arrest and death  discussed with pt's daughter in law in room.   TOTAL CRITICAL CARE TIME TAKING CARE OF THIS PATIENT: 40 minutes.    [name] M.D on 10/16/2014 at 11:22 AM  Between 7am to 6pm - Pager - [phone]  After 6pm go to [email or web address] - password [email or web address]  [facility]  Office  [phone]  CC: Primary care physician; [name], MD

## 2014-10-16 NOTE — Plan of Care (Signed)
Problem: Phase II Progression Outcomes Goal: Code status re-addressed Outcome: Progressing Palliative care has been following with family, family still requests that pt remains full code at this time

## 2014-10-16 NOTE — Plan of Care (Signed)
Problem: Phase I Progression Outcomes Goal: Voiding-avoid urinary catheter unless indicated Outcome: Not Met (add Reason) Pt has foley as pt oliguric, nephrology requests for strict I&O, acute renal failure and CRRT

## 2014-10-16 NOTE — Progress Notes (Signed)
Pt bp 72/57(63), levophed titrated up, but ultrafiltration titrated down to 175 ml/hr.

## 2014-10-16 NOTE — Progress Notes (Signed)
Last  2 blood sugars above 200.  Insulin drip restarted per protocol at 1.5 units/hr

## 2014-10-16 NOTE — Progress Notes (Signed)
Follow up Note - Critical Care Medicine Note  Patient Details:    [name] is an 64 y.o. female with known history of amyloidosis nephrotic syndrome HTN who presetnted with altered mental status. Patient was noted to be septic on admission and started onb antibiotics. Subsequently has grown Streptococcus in blood on ventilator has failed SBT attempts awaiting trach  Lines/tubes : Airway 7.5 mm (Active)  Secured at (cm) 21 cm 10/16/2014  8:40 AM  Measured From Lips 10/16/2014  8:40 AM  [place] 10/16/2014  8:40 AM  Secured By [employer] 10/16/2014  8:40 AM  Tube Holder Repositioned Yes 10/14/2014  8:31 AM  Cuff Pressure (cm H2O) 28 cm H2O 10/16/2014  8:40 AM  Site Condition Drainage (Comment) 10/16/2014  4:10 AM     CVC Triple Lumen 10/03/14 Right Internal jugular (Active)  Indication for Insertion or Continuance of Line Vasoactive infusions 10/16/2014  7:30 AM  Site Assessment Clean;Dry;Intact 10/16/2014  7:30 AM  Proximal Lumen Status Infusing 10/16/2014  7:30 AM  Medial Infusing 10/16/2014  7:30 AM  Distal Lumen Status Infusing;Flushed;Blood return noted 10/16/2014  7:30 AM  Dressing Type Transparent 10/16/2014  7:30 AM  Dressing Status Clean;Dry;Intact;Antimicrobial disc in place 10/16/2014  7:30 AM  Line Care Cap(s) changed;Connections checked and tightened 10/14/2014  8:30 AM  Dressing Intervention Antimicrobial disc changed;Dressing changed 10/14/2014  8:30 AM  Dressing Change Due 10/21/14 10/14/2014  8:30 AM     NG/OG Tube Orogastric 18 Fr. Center mouth (Active)  Placement Verification Auscultation;Xray;Other (Comment) 10/16/2014  7:38 AM  Site Assessment Clean;Dry;Intact 10/16/2014  7:38 AM  Status Suction-low intermittent 10/16/2014  7:38 AM  Drainage Appearance [employer] [place] 10/16/2014  7:38 AM  Gastric Residual 50 mL 10/16/2014  7:38 AM  Intake (mL) 100 mL 10/15/2014  6:07 PM  Output (mL) 850 mL 10/16/2014  6:05 AM     Urethral Catheter unknown Latex 16 Fr.  (Active)  Indication for Insertion or Continuance of Catheter Other (comment) 10/16/2014  8:00 AM  Site Assessment Clean;Intact 10/16/2014  7:38 AM  Catheter Maintenance Bag below level of bladder;Drainage bag/tubing not touching floor;Seal intact;No dependent loops;Bag emptied prior to transport;Catheter secured;Insertion date on drainage bag 10/16/2014  8:00 AM  Collection Container Standard drainage bag 10/16/2014  7:38 AM  Securement Method Securing device (Describe) 10/16/2014  7:38 AM  Urinary Catheter Interventions Unclamped 10/16/2014  7:38 AM  Input (mL) 30 mL 10/12/2014 10:00 PM  Output (mL) 0 mL 10/15/2014  4:30 PM    Microbiology/Sepsis markers: Results for orders placed or performed during the hospital encounter of 09/07/2014  Blood culture (routine x 2)     Status: None   Collection Time: 10/03/2014 12:45 AM  Result Value Ref Range Status   Specimen Description BLOOD  Final   Special Requests NONE  Final   Culture  Setup Time   Final    GRAM POSITIVE COCCI IN CHAINS IN BOTH AEROBIC AND ANAEROBIC BOTTLES CRITICAL RESULT CALLED TO, READ BACK BY AND VERIFIED WITH: PAM CRAWFORD AT 1406 09/04/2014 DV    Culture STREPTOCOCCUS SPECIES  Final   Report Status 10/01/2014 FINAL  Final   Organism ID, Bacteria STREPTOCOCCUS SPECIES  Final      Susceptibility   Streptococcus species - MIC (ETEST)*    ERYTHROMYCIN Value in next row Resistant      RESISTANT6    PENICILLIN Value in next row Intermediate      INTERMEDIATE0.25    CEFTRIAXONE Value in next row Sensitive  SENSITIVE0.047    VANCOMYCIN Value in next row Sensitive      SENSITIVE1.0    LEVOFLOXACIN Value in next row Sensitive      SENSITIVE1.0    * STREPTOCOCCUS SPECIES  Blood culture (routine x 2)     Status: None   Collection Time: 09/15/2014 12:45 AM  Result Value Ref Range Status   Specimen Description BLOOD  Final   Special Requests NONE  Final   Culture  Setup Time   Final    GRAM POSITIVE COCCI IN CHAINS IN BOTH AEROBIC  AND ANAEROBIC BOTTLES CRITICAL RESULT CALLED TO, READ BACK BY AND VERIFIED WITH: PAM CRAWFORD AT 1406 09/21/2014 DV    Culture   Final    STREPTOCOCCUS SPECIES REFER TO ACCESSION [id number] COLLECTED ON 09/25/2014 AT 0045 FOR SENSITIVITIES    Report Status 09/30/2014 FINAL  Final  Culture, blood (routine x 2)     Status: None   Collection Time: 10/04/14 11:50 AM  Result Value Ref Range Status   Specimen Description BLOOD  Final   Special Requests NONE  Final   Culture NO GROWTH 5 DAYS  Final   Report Status 10/09/2014 FINAL  Final  Culture, blood (routine x 2)     Status: None   Collection Time: 10/04/14 12:00 PM  Result Value Ref Range Status   Specimen Description BLOOD  Final   Special Requests NONE  Final   Culture NO GROWTH 5 DAYS  Final   Report Status 10/09/2014 FINAL  Final  Culture, respiratory (NON-Expectorated)     Status: None   Collection Time: 10/04/14  2:00 PM  Result Value Ref Range Status   Specimen Description INDUCED SPUTUM  Final   Special Requests Normal  Final   Gram Stain   Final    EXCELLENT SPECIMEN - 90-100% WBCS MANY WBC SEEN MANY YEAST    Culture MODERATE GROWTH CANDIDA ALBICANS  Final   Report Status 10/07/2014 FINAL  Final  Culture, blood (routine x 2)     Status: None (Preliminary result)   Collection Time: 10/12/14  1:34 PM  Result Value Ref Range Status   Specimen Description BLOOD  Final   Special Requests Normal  Final   Culture NO GROWTH 4 DAYS  Final   Report Status PENDING  Incomplete  Culture, blood (routine x 2)     Status: None (Preliminary result)   Collection Time: 10/12/14  4:30 PM  Result Value Ref Range Status   Specimen Description BLOOD  Final   Special Requests Normal  Final   Culture NO GROWTH 4 DAYS  Final   Report Status PENDING  Incomplete    Anti-infectives:  Anti-infectives    Start     Dose/Rate Route Frequency Ordered Stop   10/12/14 0930  erythromycin 250 mg in sodium chloride 0.9 % 100 mL IVPB     250 mg 100 mL/hr  over 60 Minutes Intravenous 3 times per day 10/12/14 0929     10/10/2014 1800  levofloxacin (LEVAQUIN) IVPB 500 mg     500 mg 100 mL/hr over 60 Minutes Intravenous Every 24 hours 10/30/2014 0944 28-Oct-2014 2359   10/05/14 1430  levofloxacin (LEVAQUIN) IVPB 750 mg  Status:  Discontinued     750 mg 100 mL/hr over 90 Minutes Intravenous Every 24 hours 10/05/14 1420 10/09/2014 0944   10/01/14 1800  cefTRIAXone (ROCEPHIN) 2 g in dextrose 5 % 50 mL IVPB - Premix  Status:  Discontinued     2 g 100  mL/hr over 30 Minutes Intravenous Every 24 hours 09/30/14 1621 10/05/14 1409   09/30/14 1630  cefTRIAXone (ROCEPHIN) 1 g in dextrose 5 % 50 mL IVPB - Premix  Status:  Discontinued     1 g 100 mL/hr over 30 Minutes Intravenous Every 24 hours 09/30/14 1621 10/01/14 1114   09/30/14 1545  cefTRIAXone (ROCEPHIN) 1 g in dextrose 5 % 50 mL IVPB - Premix  Status:  Discontinued     1 g 100 mL/hr over 30 Minutes Intravenous Every 24 hours 09/30/14 1535 09/30/14 1621   09/30/14 1545  clindamycin (CLEOCIN) IVPB 600 mg  Status:  Discontinued     600 mg 100 mL/hr over 30 Minutes Intravenous 3 times per day 09/30/14 1535 10/10/14 1453   09/27/2014 1300  vancomycin (VANCOCIN) 1,250 mg in sodium chloride 0.9 % 250 mL IVPB  Status:  Discontinued     1,250 mg 166.7 mL/hr over 90 Minutes Intravenous Every 24 hours 09/25/2014 0514 09/14/2014 1200   09/05/2014 1300  vancomycin (VANCOCIN) IVPB 1000 mg/200 mL premix  Status:  Discontinued     1,000 mg 200 mL/hr over 60 Minutes Intravenous Every 24 hours 09/10/2014 1200 09/30/14 1535   09/19/2014 0600  piperacillin-tazobactam (ZOSYN) IVPB 3.375 g  Status:  Discontinued     3.375 g 12.5 mL/hr over 240 Minutes Intravenous 3 times per day 09/29/2014 0508 09/29/14 1019   09/05/2014 0045  vancomycin (VANCOCIN) IVPB 1000 mg/200 mL premix     1,000 mg 200 mL/hr over 60 Minutes Intravenous  Once 10/04/2014 0043 09/11/2014 0229   10/02/2014 0045  piperacillin-tazobactam (ZOSYN) IVPB 3.375 g     3.375 g 12.5  mL/hr over 240 Minutes Intravenous  Once 09/17/2014 0043 09/20/2014 0251       Consults: Treatment Team:  Murlean Iba, MD Adrian Prows, MD Lloyd Huger, MD Josefine Class, MD Clyde Canterbury, MD    Studies: Ct Abdomen Pelvis Wo Contrast  10/01/2014   CLINICAL DATA:  Right flank cellulitis and edema.  EXAM: CT ABDOMEN AND PELVIS WITHOUT CONTRAST  TECHNIQUE: Multidetector CT imaging of the abdomen and pelvis was performed following the standard protocol without IV contrast.  COMPARISON:  08/31/2014  FINDINGS: Lower chest: Small bilateral pleural effusions are noted left greater than right. There is airspace consolidation noted within both lung bases. Patchy ground-glass and airspace opacity within the medial left upper lobe is noted, image number 3/series 4.  Hepatobiliary: There is no suspicious liver abnormality identified. Increased attenuation within the gallbladder may Roux represent stones and/or sludge. No biliary dilatation.  Pancreas: Normal appearance of the pancreas.  Spleen: The spleen is on unremarkable.  Adrenals/Urinary Tract: Normal appearance of the left adrenal gland. Similar appearance of low-attenuation right adrenal gland nodule measuring 2.9 cm, image 30/series 2. The kidneys are both on unremarkable. The urinary bladder is collapsed around a Foley catheter balloon.  Stomach/Bowel: Moderate distension of the stomach containing fluid level. The small bowel loops have a normal course and caliber. Normal appearance of the colon.  Vascular/Lymphatic: There is a right femoral dialysis catheter with tip in the IVC. Abdominal aorta has a normal caliber. No adenopathy.  Reproductive: Previous hysterectomy.  No adnexal mass.  Other: Moderate free fluid is identified within the abdomen and pelvis. There is diffuse body wall edema and skin thickening. No focal fluid collection identified to suggest abscess.  Musculoskeletal: Degenerative disc disease noted within the lumbar spine.   IMPRESSION: 1. Bilateral pleural effusions, ascites and anasarca is identified consistent with  fluid overload state. 2. Subcutaneous fat stranding and skin thickening may be related to anasarca or cellulitis. 3. No fluid collections identified. 4. Left lung opacities may reflect pneumonia and/r or areas of asymmetric edema.   Electronically Signed   By: Kerby Moors M.D.   On: 10/01/2014 15:17   Dg Chest 1 View  09/07/2014   CLINICAL DATA:  Central line placement.  EXAM: CHEST  1 VIEW  COMPARISON:  06/26/2014  FINDINGS: Right IJ central line with tip crossing midline and terminating in the region of the left brachiocephalic vein.  Hypoventilation. When accounting for this there is no cardiomegaly or change in aortic tortuosity. No pneumothorax, edema, effusion, or definitive pneumonia.  These results were called by telephone at the time of interpretation on 09/08/2014 at 3:14 am to [place] , who verbally acknowledged these results.  IMPRESSION: 1. Right IJ central line with tip crossing midline and projecting at the left brachiocephalic vein. No pneumothorax. 2. Hypoventilation.   Electronically Signed   By: Monte Fantasia M.D.   On: 09/16/2014 03:15   Ct Head Wo Contrast  10/01/2014   CLINICAL DATA:  Altered mental status after fall from couch. Posterior neck pain. Initial encounter.  EXAM: CT HEAD WITHOUT CONTRAST  CT CERVICAL SPINE WITHOUT CONTRAST  TECHNIQUE: Multidetector CT imaging of the head and cervical spine was performed following the standard protocol without intravenous contrast. Multiplanar CT image reconstructions of the cervical spine were also generated.  COMPARISON:  None.  FINDINGS: CT HEAD FINDINGS  Skull and Sinuses:Negative for fracture or destructive process. The mastoids, middle ears, and imaged paranasal sinuses are clear.  Orbits: No acute abnormality.  Brain: No evidence of acute infarction, hemorrhage, hydrocephalus, or mass lesion/mass effect.  CT CERVICAL SPINE FINDINGS   Thyroid gland has indistinct margins with mild surrounding fat stranding. A sub cm dense nodule is noted on the right. No surrounding adenopathy.  No evidence of acute fracture or traumatic malalignment. No gross cervical canal hematoma. There is degenerative disc disease and endplate spurring, most progressed at C4-5, C5-6, and C6-7. Spurring is most notable at C5-6 with right uncovertebral spurs causing advanced foraminal stenosis. A superimposed right paracentral disc herniation with vacuum phenomenon contributes to the C5-6 right foraminal stenosis.  IMPRESSION: 1. No evidence of intracranial or cervical spine injury. 2. Mid and lower cervical degenerative disc disease with foraminal stenoses, most advanced on the right at C5-6. 3. Edema noted around the thyroid gland, suspicious for thyroiditis. Correlate with endocrine history and thyroid function tests.   Electronically Signed   By: Monte Fantasia M.D.   On: 09/07/2014 02:31   Ct Cervical Spine Wo Contrast  10/03/2014   CLINICAL DATA:  Altered mental status after fall from couch. Posterior neck pain. Initial encounter.  EXAM: CT HEAD WITHOUT CONTRAST  CT CERVICAL SPINE WITHOUT CONTRAST  TECHNIQUE: Multidetector CT imaging of the head and cervical spine was performed following the standard protocol without intravenous contrast. Multiplanar CT image reconstructions of the cervical spine were also generated.  COMPARISON:  None.  FINDINGS: CT HEAD FINDINGS  Skull and Sinuses:Negative for fracture or destructive process. The mastoids, middle ears, and imaged paranasal sinuses are clear.  Orbits: No acute abnormality.  Brain: No evidence of acute infarction, hemorrhage, hydrocephalus, or mass lesion/mass effect.  CT CERVICAL SPINE FINDINGS  Thyroid gland has indistinct margins with mild surrounding fat stranding. A sub cm dense nodule is noted on the right. No surrounding adenopathy.  No evidence of acute fracture or  traumatic malalignment. No gross cervical  canal hematoma. There is degenerative disc disease and endplate spurring, most progressed at C4-5, C5-6, and C6-7. Spurring is most notable at C5-6 with right uncovertebral spurs causing advanced foraminal stenosis. A superimposed right paracentral disc herniation with vacuum phenomenon contributes to the C5-6 right foraminal stenosis.  IMPRESSION: 1. No evidence of intracranial or cervical spine injury. 2. Mid and lower cervical degenerative disc disease with foraminal stenoses, most advanced on the right at C5-6. 3. Edema noted around the thyroid gland, suspicious for thyroiditis. Correlate with endocrine history and thyroid function tests.   Electronically Signed   By: Monte Fantasia M.D.   On: 09/27/2014 02:31   Nm Cardiac Muga Rest  09/20/2014   CLINICAL DATA:  . Evaluate cardiac function in relation to chemotherapy.  EXAM: NUCLEAR MEDICINE CARDIAC BLOOD POOL IMAGING (MUGA)  TECHNIQUE: Cardiac multi-gated acquisition was performed at rest following intravenous injection of Tc-27m labeled red blood cells.  RADIOPHARMACEUTICALS:  21.9 mCi Technetium-97m in-vitro labeled red blood cells IV  COMPARISON:  None.  FINDINGS: No  focal wall motion abnormality of the left ventricle.  Calculated left ventricular ejection fraction equals 65%  IMPRESSION: Left ventricular ejection fraction equals 65%   Electronically Signed   By: Suzy Bouchard M.D.   On: 09/20/2014 09:31   Dg Chest Port 1 View  10/07/2014   CLINICAL DATA:  Respiratory failure/ventilator dependency.  EXAM: PORTABLE CHEST - 1 VIEW  COMPARISON:  10/03/2014  FINDINGS: Endotracheal tube tip 3.1 cm above the carina. Right internal jugular line tip: Upper SVC. Nasogastric tube enters the stomach.  Moderate enlargement of the cardiopericardial silhouette noted with bilateral increased airspace opacities.  Bilateral degenerative glenohumeral arthropathy. Tortuous thoracic aorta.  IMPRESSION: 1. Worsened bilateral airspace opacities. In the context of the  patient's moderate enlargement of the cardiopericardial silhouette, the appearance is suspicious for acute pulmonary edema. 2. The endotracheal tube has been successfully retracted, and the tubes and lines appear satisfactorily positioned.   Electronically Signed   By: Van Clines M.D.   On: 10/07/2014 08:42   Dg Chest Port 1 View  10/03/2014   CLINICAL DATA:  Orogastric and endotracheal tube placement.  EXAM: PORTABLE CHEST - 1 VIEW  COMPARISON:  10/03/2014 at 12:51 p.m.  FINDINGS: Orogastric tube passes below the diaphragm to curl within a mostly decompressed stomach.  Endotracheal tube tip projects the right mainstem bronchus.  Patchy airspace consolidation is noted in the peripheral right upper mid and lower lung and inferior to the right hilum.  Hazy left sided opacity noted previously has mostly resolved. There is mild residual opacity at the left lung base likely atelectasis. There is no pulmonary edema.  No pneumothorax.  IMPRESSION: 1. Endotracheal tube is now positioned. Tip is in the right mainstem bronchus. It will need to be retracted 3 cm for optimal positioning. Critical Value/emergent results were called by telephone at the time of interpretation on 10/03/2014 at 5:25 pm to the nurse in charge of this patient, who verbally acknowledged these results and will relay these results to respiratory. 2. Persistent right lung consolidation. Improved left lung aeration. 3. Orogastric tube well positioned curling within a mostly decompressed stomach.   Electronically Signed   By: Lajean Manes M.D.   On: 10/03/2014 17:26   Dg Chest Port 1 View  10/03/2014   CLINICAL DATA:  Status post central line adjustment  EXAM: PORTABLE CHEST - 1 VIEW  COMPARISON:  10/03/2014  FINDINGS: Cardiac shadow is stable. Increased density  is noted over the left hemi thorax consistent with a posterior fusion. The previously seen central line is been repositioned and now lies within the proximal superior vena cava. No  pneumothorax is noted. Patchy changes are seen within the lungs.  IMPRESSION: Patchy infiltrative changes are again identified bilaterally. Diffuse increased density is noted on the left which may be related to an evolving effusion. The central line has been repositioned without evidence of pneumothorax.   Electronically Signed   By: Inez Catalina M.D.   On: 10/03/2014 13:19   Dg Chest Port 1 View  10/03/2014   CLINICAL DATA:  Central line adjustment.  EXAM: PORTABLE CHEST - 1 VIEW  COMPARISON:  10/03/2014 at 10:18 a.m.  FINDINGS: Right sided central line curls overlying the right neck. This does not enter the thorax. It is not changed from the earlier study.  No change in the appearance of the lungs.  No pneumothorax.  IMPRESSION: Right central line is seen curling over the neck, not entering thorax and not changed from the prior study.   Electronically Signed   By: Lajean Manes M.D.   On: 10/03/2014 12:13   Dg Chest Port 1 View  10/03/2014   CLINICAL DATA:  Central line placement  EXAM: PORTABLE CHEST - 1 VIEW  COMPARISON:  None.  FINDINGS: Cardiomediastinal silhouette is stable. Patchy airspace is right upper lobe left midlung and left lower lobe again noted suspicious for multifocal pneumonia. There is only partially visualized tortuous central line in right neck region. Clinical correlation is necessary. There is no pneumothorax.  IMPRESSION: Again noted patchy airspace disease bilaterally suspicious for multifocal pneumonia. Tortuous only partially visualized central line in right neck region. Probable significant retracted right IJ line. Clinical correlation is necessary. These results were called by telephone at the time of interpretation on 10/03/2014 at 10:53 am to ICU. Nurse Sheryn Bison , who verbally acknowledged these results.   Electronically Signed   By: Lahoma Crocker M.D.   On: 10/03/2014 10:55   Dg Chest Port 1 View  10/02/2014   CLINICAL DATA:  Shortness of breath, acute onset. Initial encounter.   EXAM: PORTABLE CHEST - 1 VIEW  COMPARISON:  Chest radiograph performed 09/22/2014  FINDINGS: The lungs are hypoexpanded. Patchy bilateral airspace opacities are noted, raising concern for multifocal pneumonia. Underlying asymmetric interstitial edema on the left side cannot be excluded. A small left pleural effusion is suspected. No pneumothorax is seen.  The cardiomediastinal silhouette is mildly enlarged. No acute osseous abnormalities are seen. A right IJ line is noted ending overlying the left brachiocephalic vein.  IMPRESSION: 1. Lungs hypoexpanded. Patchy bilateral airspace opacities raise concern for multifocal pneumonia. 2. Underlying asymmetric interstitial edema on the left side cannot be excluded. Suspect small left pleural effusion. 3. Mild cardiomegaly noted. 4. Right IJ line noted ending overlying the left brachiocephalic vein, as noted on the prior study. It appears to have been retracted mildly since the prior study.   Electronically Signed   By: Garald Balding M.D.   On: 10/02/2014 20:31   Dg Chest Portable 1 View  09/19/2014   CLINICAL DATA:  Central line adjustment  EXAM: PORTABLE CHEST - 1 VIEW  COMPARISON:  Same day at 2:43 a.m.  FINDINGS: Shortening of the right IJ central line, but tip still terminates left of midline near the brachiocephalic vein.  Hypoventilation. Stable heart size and mediastinal contours, size accentuated by technique. There is interstitial crowding without edema, effusion, pneumothorax, or overt pneumonia.  IMPRESSION: 1.  Despite right central line shortening the tip still terminates near the left brachiocephalic vein. 2. Hypoventilation.   Electronically Signed   By: Monte Fantasia M.D.   On: 09/05/2014 04:28   Dg Abd Portable 1v  10/13/2014   CLINICAL DATA:  Ileus, sepsis and renal failure.  EXAM: PORTABLE ABDOMEN - 1 VIEW  COMPARISON:  10/03/2014  FINDINGS: Since a prior study there is improvement in ileus pattern with no significant gaseous distention of bowel  identified. A nasogastric tube remains extending into the distal stomach. A right groin temporary dialysis catheter extends into the lower IVC.  IMPRESSION: Improvement in ileus with no significant dilated bowel loops identified.   Electronically Signed   By: Aletta Edouard M.D.   On: 10/13/2014 14:02   Dg Abd Portable 1v  10/03/2014   CLINICAL DATA:  OG tube placement  EXAM: PORTABLE ABDOMEN - 1 VIEW  COMPARISON:  CT abdomen pelvis dated 09/23/2014  FINDINGS: Enteric tube terminates in the gastric cardia.  Nonobstructive bowel gas pattern.  IMPRESSION: Enteric tube terminates in the gastric cardia.   Electronically Signed   By: Julian Hy M.D.   On: 10/03/2014 17:22     Events:  Subjective:    Overnight Issues: patient continues to be dependent on the vent at this time. Has had to go up on pressors also. Patient is sedated responds to pain  Objective:  Vital signs for last 24 hours: Temp:  [96.7 F (35.9 C)-98.4 F (36.9 C)] 97.6 F (36.4 C) (06/12 0730) Pulse Rate:  [83-91] 90 (06/12 1104) Resp:  [12-23] 16 (06/12 1104) BP: (72-111)/(34-87) 111/69 mmHg (06/12 1104) SpO2:  [93 %-100 %] 95 % (06/12 1104) FiO2 (%):  [35 %] 35 % (06/12 1000) Weight:  [73.9 kg (162 lb 14.7 oz)] 73.9 kg (162 lb 14.7 oz) (06/12 0430)  Hemodynamic parameters for last 24 hours:    Intake/Output from previous day: 06/11 0701 - 06/12 0700 In: 3443.6 [I.V.:1105.4; NG/GT:250; IV Piggyback:400; FBP:1025.8] Out: 5277 [Urine:13; Emesis/NG output:850]  Intake/Output this shift: Total I/O In: 280 [TPN:280] Out: 213 [Other:213]  Vent settings for last 24 hours: Vent Mode:  [-] SIMV FiO2 (%):  [35 %] 35 % Set Rate:  [8 bmp] 8 bmp Vt Set:  [500 mL] 500 mL PEEP:  [5 cmH20] 5 cmH20 Pressure Support:  [20 cmH20] 20 cmH20 Plateau Pressure:  [18 cmH20] 18 cmH20  Physical Exam:  Head atraumatic normocephalic Eyes PERRLA no incterus Throat:orally intubated Respiratory CTA bilateral no ronchi rales  noted Cardio RRR s1s2 normal no g/r/m GI soft non-tender no rebound Extremities no C/C/C Neuro flaccid GCS<8T  Results for orders placed or performed during the hospital encounter of 09/17/2014 (from the past 24 hour(s))  Glucose, capillary     Status: Abnormal   Collection Time: 10/15/14 12:26 PM  Result Value Ref Range   Glucose-Capillary 203 (H) 65 - 99 mg/dL  Glucose, capillary     Status: Abnormal   Collection Time: 10/15/14  1:19 PM  Result Value Ref Range   Glucose-Capillary 182 (H) 65 - 99 mg/dL  Glucose, capillary     Status: Abnormal   Collection Time: 10/15/14  2:21 PM  Result Value Ref Range   Glucose-Capillary 155 (H) 65 - 99 mg/dL  Glucose, capillary     Status: Abnormal   Collection Time: 10/15/14  3:12 PM  Result Value Ref Range   Glucose-Capillary 128 (H) 65 - 99 mg/dL  Renal function     Status: Abnormal  Collection Time: 10/15/14  3:34 PM  Result Value Ref Range   Sodium 135 135 - 145 mmol/L   Potassium 3.6 3.5 - 5.1 mmol/L   Chloride 102 101 - 111 mmol/L   CO2 26 22 - 32 mmol/L   Glucose, Bld 121 (H) 65 - 99 mg/dL   BUN 24 (H) 6 - 20 mg/dL   Creatinine, Ser SEE COMMENTS 0.44 - 1.00 mg/dL   Calcium 8.6 (L) 8.9 - 10.3 mg/dL   Phosphorus SEE COMMENTS 2.5 - 4.6 mg/dL   Albumin 1.9 (L) 3.5 - 5.0 g/dL   GFR calc non Af Amer NOT CALCULATED >60 mL/min   GFR calc Af Amer NOT CALCULATED >60 mL/min   Anion gap 7 5 - 15  Glucose, capillary     Status: Abnormal   Collection Time: 10/15/14  4:02 PM  Result Value Ref Range   Glucose-Capillary 123 (H) 65 - 99 mg/dL  Glucose, capillary     Status: Abnormal   Collection Time: 10/15/14  5:05 PM  Result Value Ref Range   Glucose-Capillary 129 (H) 65 - 99 mg/dL  Glucose, capillary     Status: Abnormal   Collection Time: 10/15/14  6:03 PM  Result Value Ref Range   Glucose-Capillary 150 (H) 65 - 99 mg/dL  Glucose, capillary     Status: Abnormal   Collection Time: 10/15/14  6:54 PM  Result Value Ref Range    Glucose-Capillary 161 (H) 65 - 99 mg/dL  Glucose, capillary     Status: Abnormal   Collection Time: 10/15/14  8:07 PM  Result Value Ref Range   Glucose-Capillary 200 (H) 65 - 99 mg/dL   Comment 1 Notify RN   Glucose, capillary     Status: Abnormal   Collection Time: 10/15/14 11:45 PM  Result Value Ref Range   Glucose-Capillary 210 (H) 65 - 99 mg/dL  Renal function     Status: Abnormal   Collection Time: 10/16/14 12:02 AM  Result Value Ref Range   Sodium 134 (L) 135 - 145 mmol/L   Potassium 3.9 3.5 - 5.1 mmol/L   Chloride 103 101 - 111 mmol/L   CO2 27 22 - 32 mmol/L   Glucose, Bld 239 (H) 65 - 99 mg/dL   BUN 23 (H) 6 - 20 mg/dL   Creatinine, Ser SEE COMMENTS 0.44 - 1.00 mg/dL   Calcium 8.6 (L) 8.9 - 10.3 mg/dL   Phosphorus  2.5 - 4.6 mg/dL   Albumin 1.8 (L) 3.5 - 5.0 g/dL   GFR calc non Af Amer NOT CALCULATED >60 mL/min   GFR calc Af Amer NOT CALCULATED >60 mL/min   Anion gap 4 (L) 5 - 15  Glucose, capillary     Status: Abnormal   Collection Time: 10/16/14  1:56 AM  Result Value Ref Range   Glucose-Capillary 194 (H) 65 - 99 mg/dL   Comment 1 Notify RN   Glucose, capillary     Status: Abnormal   Collection Time: 10/16/14  2:51 AM  Result Value Ref Range   Glucose-Capillary 162 (H) 65 - 99 mg/dL   Comment 1 Notify RN   Glucose, capillary     Status: Abnormal   Collection Time: 10/16/14  3:47 AM  Result Value Ref Range   Glucose-Capillary 142 (H) 65 - 99 mg/dL   Comment 1 Notify RN   Magnesium     Status: None   Collection Time: 10/16/14  4:13 AM  Result Value Ref Range   Magnesium 1.7 1.7 -  2.4 mg/dL  APTT     Status: Abnormal   Collection Time: 10/16/14  4:13 AM  Result Value Ref Range   aPTT 48 (H) 24 - 36 seconds  CBC     Status: Abnormal   Collection Time: 10/16/14  4:13 AM  Result Value Ref Range   WBC 12.0 (H) 3.6 - 11.0 K/uL   RBC 3.21 (L) 3.80 - 5.20 MIL/uL   Hemoglobin 10.2 (L) 12.0 - 16.0 g/dL   HCT 31.2 (L) 35.0 - 47.0 %   MCV 97.2 80.0 - 100.0 fL    MCH 31.6 26.0 - 34.0 pg   MCHC 32.5 32.0 - 36.0 g/dL   RDW 21.4 (H) 11.5 - 14.5 %   Platelets 72 (L) 150 - 440 K/uL  Basic metabolic panel     Status: Abnormal   Collection Time: 10/16/14  4:13 AM  Result Value Ref Range   Sodium 136 135 - 145 mmol/L   Potassium 4.3 3.5 - 5.1 mmol/L   Chloride 106 101 - 111 mmol/L   CO2 25 22 - 32 mmol/L   Glucose, Bld 132 (H) 65 - 99 mg/dL   BUN 23 (H) 6 - 20 mg/dL   Creatinine, Ser SEE COMMENTS 0.44 - 1.00 mg/dL   Calcium 8.8 (L) 8.9 - 10.3 mg/dL   GFR calc non Af Amer NOT CALCULATED >60 mL/min   GFR calc Af Amer NOT CALCULATED >60 mL/min   Anion gap 5 5 - 15  Glucose, capillary     Status: Abnormal   Collection Time: 10/16/14  4:59 AM  Result Value Ref Range   Glucose-Capillary 100 (H) 65 - 99 mg/dL  Glucose, capillary     Status: None   Collection Time: 10/16/14  5:47 AM  Result Value Ref Range   Glucose-Capillary 91 65 - 99 mg/dL  Glucose, capillary     Status: None   Collection Time: 10/16/14  6:50 AM  Result Value Ref Range   Glucose-Capillary 94 65 - 99 mg/dL   Comment 1 Notify RN   Renal function     Status: Abnormal   Collection Time: 10/16/14  7:54 AM  Result Value Ref Range   Sodium 136 135 - 145 mmol/L   Potassium 4.4 3.5 - 5.1 mmol/L   Chloride 103 101 - 111 mmol/L   CO2 28 22 - 32 mmol/L   Glucose, Bld 153 (H) 65 - 99 mg/dL   BUN 22 (H) 6 - 20 mg/dL   Creatinine, Ser SEE COMMENTS 0.44 - 1.00 mg/dL   Calcium 8.7 (L) 8.9 - 10.3 mg/dL   Phosphorus SEE COMMENTS 2.5 - 4.6 mg/dL   Albumin 1.9 (L) 3.5 - 5.0 g/dL   GFR calc non Af Amer NOT CALCULATED >60 mL/min   GFR calc Af Amer NOT CALCULATED >60 mL/min   Anion gap 5 5 - 15  Glucose, capillary     Status: Abnormal   Collection Time: 10/16/14  8:11 AM  Result Value Ref Range   Glucose-Capillary 135 (H) 65 - 99 mg/dL   Comment 1 Glucose Stabilizer   Glucose, capillary     Status: Abnormal   Collection Time: 10/16/14  9:32 AM  Result Value Ref Range   Glucose-Capillary 141  (H) 65 - 99 mg/dL   Comment 1 Glucose Stabilizer      Assessment/Plan:   PULMONARY-Acute respiratory failure with hypoxia -await tracheostomy  -titrate FiO2 as tolerated -will titrate PEEP as tolerated -MDI as ordered   CARDIO-Hypotension -still requiring  pressors at this time had to go up overnight -will titrate levophed as needed  RENAL -on CRRT presently to be continued -renal on board  ID -will continue with antibiotics levaquin and erythromycin -cultures growing streptococcus  GI -on Pantoprazole  ENDO -Monitor FSBS -continue with insulin as ordered   LOS: 18 days    Critical Care Total Time: 35MIN  I have personally obtained a history, examined the patient, evaluated laboratory and imaging results, formulated the assessment and plan and placed orders.  The Patient requires high complexity decision making for assessment and support, frequent evaluation and titration of therapies, application of advanced monitoring technologies and extensive interpretation of multiple databases.    *Care during the described time interval was provided by me and/or other providers on the critical care team.  I have reviewed this patient's available data, including medical history, events of note, physical examination and test results as part of my evaluation.

## 2014-10-16 NOTE — Plan of Care (Signed)
Problem: Phase I Progression Outcomes Goal: Sepsis Protocol initiated if indicated Outcome: Progressing Pt remains on vasopressors, titrating for map>65

## 2014-10-16 NOTE — Progress Notes (Signed)
Pt bp 77/52 (58), have titrated up on levophed drip due to hypotension, also will titrate down ultrafiltration to 200 ml/hr on CRRT.

## 2014-10-16 NOTE — Plan of Care (Signed)
Problem: Phase I Progression Outcomes Goal: Optimized method of communication. Outcome: Progressing Pt able to nod head when spoken to, at times will follow simple commands, still unable to determine orientation

## 2014-10-16 NOTE — Plan of Care (Signed)
Problem: Consults Goal: Nutrition Consult-if indicated Outcome: Completed/Met Date Met:  10/16/14 Dietician following pt, adjusting tpn requirements

## 2014-10-16 NOTE — Plan of Care (Signed)
Problem: Consults Goal: Ventilated Patients Patient Education See Patient Education Module for education specifics.  Outcome: Progressing Pt family educated in SIMV mode on ventilator and how pt is taking some spontaneous breaths.  Pt unable to wean, to go for trach tomorrow 10/30/2014

## 2014-10-16 NOTE — Progress Notes (Signed)
Nutrition Follow-up  DOCUMENTATION CODES:     INTERVENTION:   (PN) Continue TPN at current rate at this time. Current TPN order discontinued as duplicate in error. Discussed with RN, Darlyn Chamber and [phone] Pharmacist. Orders placed for current TPN and TPN to start 6/12 at 18:00  NUTRITION DIAGNOSIS:  Inadequate oral intake related to inability to eat as evidenced by NPO status.being addressed with TPN    GOAL:   (PN) tolerance at goal rate EN: initation of trophic feedings when cliinically feasible    MONITOR:   (PN, EN, Electrolyte and renal profile, Anthropometric, Digestive system )  REASON FOR ASSESSMENT:  Consult  (RD Follow Up)  ASSESSMENT:  Pt remains on vent, CRRT, increasing levophed this am secondary to hypotension.   PN: Noted current order for TPN (ordered 6/11 to start at 18:00 and stop at 6/12 at 17:59 was discontinued by nursing read as duplicate order.  Discussed with RN, Darlyn Chamber and TPN has been running throughout last shift and currently running.  Discussed with Pharmacist Jody.    Electrolyte and Renal Profile:  Recent Labs Lab 10/14/14 0443  10/14/14 1815  10/15/14 0307  10/15/14 1534 10/16/14 0002 10/16/14 0413 10/16/14 0754  BUN 24*  < > 29*  < >  --   < > 24* 23* 23* 22*  CREATININE NOT VALID  < > SEE COMMENTS  < >  --   < > SEE COMMENTS SEE COMMENTS SEE COMMENTS SEE COMMENTS  NA 136  < > 135  < >  --   < > 135 134* 136 136  K 5.1  < > 4.0  < >  --   < > 3.6 3.9 4.3 4.4  MG 1.9  --   --   --  1.8  --   --   --  1.7  --   PHOS  --   < > SEE COMMENTS  --   --   --  SEE COMMENTS  --   --  SEE COMMENTS  < > = values in this interval not displayed. Glucose Profile:  Recent Labs  10/16/14 0547 10/16/14 0650 10/16/14 0811  GLUCAP 91 94 135*   Medications: insulin drip restarted  Height:  Ht Readings from Last 1 Encounters:  09/14/2014 5\' 1"  (1.549 m)    Weight:  Wt Readings from Last 1 Encounters:  10/16/14 162 lb 14.7 oz (73.9 kg)     Filed Weights   10/14/14 0200 10/15/14 0500 10/16/14 0430  Weight: 173 lb 15.1 oz (78.9 kg) 165 lb 9.1 oz (75.1 kg) 162 lb 14.7 oz (73.9 kg)      BMI:  Body mass index is 30.8 kg/(m^2).  Estimated Nutritional Needs:  Kcal:  (11-14 kcals/d) Using actual wt of 95kg) 1050-1330 kcals/d  Protein:  (2.0-2.5 g/d) Using IbW of 48kg 96-120 g/d  Fluid:  (25-57ml/kg) Using IBW of 48kg 1200-1467ml/d  Skin:   (stage II pressure ulcer on buttock)  Diet Order:  Diet NPO time specified .TPN (CLINIMIX-E) Adult .TPN (CLINIMIX-E) Adult  EDUCATION NEEDS:  No education needs identified at this time   Intake/Output Summary (Last 24 hours) at 10/16/14 0908 Last data filed at 10/16/14 0800  Gross per 24 hour  Intake 3300.69 ml  Output   4114 ml  Net -813.31 ml    HIGH Care Level  Wilver Tignor B. Zenia Resides, [place], [place] (pager)

## 2014-10-16 NOTE — Progress Notes (Signed)
Pt remains on vent, no wean attempt today, plan for tracheostomy in am 10/13/2014.  Pt to receive two units FFP in am before precedure.  Pt RASS -1, pt will follow simple commands at times.  NSR, lungs coarse.  Remains on TPN,  Small liquid bm smear.  Oliguric, minimal urine output.  Remains on CRRT, ultrafiltration reduced to 150 ml/hr due to hypotension this morning.  Pt remains on levophed (which has been titrated up today for MAP >65) and vasopressin.  CRRT cartridge changed this shift.

## 2014-10-16 NOTE — Plan of Care (Signed)
Problem: Phase I Progression Outcomes Goal: Hemodynamically stable Outcome: Not Progressing Pt remains on vaspressors, levophed had to be titrated up to 34mcg to keep map >65.

## 2014-10-16 NOTE — Plan of Care (Signed)
Problem: Phase I Progression Outcomes Goal: Tracheostomy by Vent Day 14 Outcome: Not Met (add Reason) Pt is to go for tracheostomy tomorrow 10/15/2014.  Surgery has not been performed prior due to pt coags and low platelets

## 2014-10-16 NOTE — Progress Notes (Signed)
[facility] NOTE  Pharmacy Consult for Electrolyte and CRRT medication management Indication: ICU Status, CRRT, TPN   No Known Allergies  Patient Measurements: Height: 5\' 1"  (154.9 cm) Weight: 162 lb 14.7 oz (73.9 kg) IBW/kg (Calculated) : 47.8   Vital Signs: Temp: 97.6 F (36.4 C) (06/12 0400) Temp Source: Oral (06/12 0400) BP: 86/65 mmHg (06/12 0700) Pulse Rate: 83 (06/12 0700) Intake/Output from previous day: 06/11 0701 - 06/12 0700 In: 3363.6 [I.V.:1095.4; NG/GT:250; IV Piggyback:400; TPN:1618.2] Out: 4337 [Urine:13; Emesis/NG output:850] Intake/Output from this shift:   Vent settings for last 24 hours: Vent Mode:  [-] SIMV FiO2 (%):  [35 %] 35 % Set Rate:  [8 bmp] 8 bmp Vt Set:  [500 mL] 500 mL PEEP:  [5 cmH20] 5 cmH20 Pressure Support:  [20 cmH20] 20 cmH20 Plateau Pressure:  [18 cmH20] 18 cmH20  Labs:  Recent Labs  10/14/14 0443 10/14/14 0836 10/14/14 1341 10/14/14 1815  10/15/14 0307 10/15/14 0727 10/15/14 1534 10/16/14 0002 10/16/14 0413  WBC 21.5*  --   --   --   --  13.4*  --   --   --  12.0*  HGB 9.8*  --   --   --   --  8.9*  --   --   --  10.2*  HCT 29.6*  --   --   --   --  27.3*  --   --   --  31.2*  PLT 71*  --   --   --   --  70*  --   --   --  72*  APTT 50*  --   --   --   --  51*  --   --   --  48*  INR 2.00  --   --   --   --   --   --   --   --   --   CREATININE NOT VALID SEE COMMENTS SEE COMMENTS SEE COMMENTS  < >  --  UNABLE TO REPORT DUE TO ICTERUS SEE COMMENTS SEE COMMENTS SEE COMMENTS  MG 1.9  --   --   --   --  1.8  --   --   --  1.7  PHOS  --  SEE COMMENTS  --  SEE COMMENTS  --   --   --  SEE COMMENTS  --   --   ALBUMIN 2.1* 1.9* 1.8* 1.8*  < >  --  1.9* 1.9* 1.8*  --   PROT 6.5  --  5.8*  --   --   --   --   --   --   --   AST 376*  --  362*  --   --   --   --   --   --   --   ALT 114*  --  107*  --   --   --   --   --   --   --   ALKPHOS 1375*  --  1291*  --   --   --   --   --   --   --   BILITOT 32.8*   --  27.7*  --   --   --   --   --   --   --   < > = values in this interval not displayed. CrCl cannot be calculated (Patient has no serum creatinine result on file.).  Recent Labs  10/16/14 0459 10/16/14 0547 10/16/14 0650  GLUCAP 100* 91 94    Microbiology: Recent Results (from the past 720 hour(s))  Blood culture (routine x 2)     Status: None   Collection Time: 09/13/2014 12:45 AM  Result Value Ref Range Status   Specimen Description BLOOD  Final   Special Requests NONE  Final   Culture  Setup Time   Final    GRAM POSITIVE COCCI IN CHAINS IN BOTH AEROBIC AND ANAEROBIC BOTTLES CRITICAL RESULT CALLED TO, READ BACK BY AND VERIFIED WITH: PAM CRAWFORD AT 1406 09/09/2014 DV    Culture STREPTOCOCCUS SPECIES  Final   Report Status 10/01/2014 FINAL  Final   Organism ID, Bacteria STREPTOCOCCUS SPECIES  Final      Susceptibility   Streptococcus species - MIC (ETEST)*    ERYTHROMYCIN Value in next row Resistant      RESISTANT6    PENICILLIN Value in next row Intermediate      INTERMEDIATE0.25    CEFTRIAXONE Value in next row Sensitive      SENSITIVE0.047    VANCOMYCIN Value in next row Sensitive      SENSITIVE1.0    LEVOFLOXACIN Value in next row Sensitive      SENSITIVE1.0    * STREPTOCOCCUS SPECIES  Blood culture (routine x 2)     Status: None   Collection Time: 09/25/2014 12:45 AM  Result Value Ref Range Status   Specimen Description BLOOD  Final   Special Requests NONE  Final   Culture  Setup Time   Final    GRAM POSITIVE COCCI IN CHAINS IN BOTH AEROBIC AND ANAEROBIC BOTTLES CRITICAL RESULT CALLED TO, READ BACK BY AND VERIFIED WITH: PAM CRAWFORD AT 1406 09/24/2014 DV    Culture   Final    STREPTOCOCCUS SPECIES REFER TO ACCESSION [id number] COLLECTED ON 09/23/2014 AT 0045 FOR SENSITIVITIES    Report Status 09/30/2014 FINAL  Final  Culture, blood (routine x 2)     Status: None   Collection Time: 10/04/14 11:50 AM  Result Value Ref Range Status   Specimen Description BLOOD  Final    Special Requests NONE  Final   Culture NO GROWTH 5 DAYS  Final   Report Status 10/09/2014 FINAL  Final  Culture, blood (routine x 2)     Status: None   Collection Time: 10/04/14 12:00 PM  Result Value Ref Range Status   Specimen Description BLOOD  Final   Special Requests NONE  Final   Culture NO GROWTH 5 DAYS  Final   Report Status 10/09/2014 FINAL  Final  Culture, respiratory (NON-Expectorated)     Status: None   Collection Time: 10/04/14  2:00 PM  Result Value Ref Range Status   Specimen Description INDUCED SPUTUM  Final   Special Requests Normal  Final   Gram Stain   Final    EXCELLENT SPECIMEN - 90-100% WBCS MANY WBC SEEN MANY YEAST    Culture MODERATE GROWTH CANDIDA ALBICANS  Final   Report Status 10/07/2014 FINAL  Final  Culture, blood (routine x 2)     Status: None (Preliminary result)   Collection Time: 10/12/14  1:34 PM  Result Value Ref Range Status   Specimen Description BLOOD  Final   Special Requests Normal  Final   Culture NO GROWTH 3 DAYS  Final   Report Status PENDING  Incomplete  Culture, blood (routine x 2)     Status: None (Preliminary result)   Collection Time: 10/12/14  4:30 PM  Result Value Ref Range Status   Specimen Description BLOOD  Final   Special Requests Normal  Final   Culture NO GROWTH 3 DAYS  Final   Report Status PENDING  Incomplete    Medications:  Scheduled:  . antiseptic oral rinse  7 mL Mouth Rinse QID  . budesonide (PULMICORT) nebulizer solution  0.5 mg Nebulization BID  . chlorhexidine  15 mL Mouth Rinse BID  . erythromycin  250 mg Intravenous 3 times per day  . ipratropium-albuterol  3 mL Nebulization QID  . lactulose  20 g Oral BID  . levofloxacin (LEVAQUIN) IV  500 mg Intravenous Q24H  . midodrine  10 mg Oral TID  . pantoprazole (PROTONIX) IV  40 mg Intravenous Q12H  . sodium chloride  10-40 mL Intracatheter Q12H  . sucralfate  1 g Oral 4 times per day   Infusions:  . Marland KitchenTPN (CLINIMIX-E) Adult    . fentaNYL infusion  INTRAVENOUS 10 mcg/hr (10/15/14 1900)  . insulin (NOVOLIN-R) infusion 0.3 Units/hr (10/16/14 8295)  . norepinephrine (LEVOPHED) Adult infusion 30 mcg/min (10/16/14 0347)  . pureflow 2,500 mL/hr at 10/16/14 0326  . vasopressin (PITRESSIN) infusion - *FOR SHOCK* 0.03 Units/min (10/15/14 1900)   PRN: acetaminophen **OR** acetaminophen, albuterol, heparin, polyethylene glycol powder, sodium chloride  Assessment: 64 yo female requiring mechanical ventilation, CRRT, and TPN. Patient is scheduled for trach on 6/13.    Plan:   1. Electrolytes/TPN:Electrolytes WNL, no replacement needed at this time. Will recheck tomorrow AM. Insulin drip resumed overnight.  2. CRRT Medication adjustment: no further adjustments warranted at this time.   3. Levofloxacin: Will continue patient on levofloxacin 500mg  IV q24hr through stop date on 6/14. Will need to adjust dosing if CRRT is stopped.    4. Constipation: Patient currently ordered erythromycin 250mg  IV Q8hr and lactulose 20g VT BID. BM documented 6/10. Will continue to monitor.   Pharmacy will continue to monitor and adjust per consult.    Rexene Edison, PharmD Clinical Pharmacist 10/16/2014,7:31 AM

## 2014-10-16 NOTE — Progress Notes (Signed)
Dr. Holley Raring at bedside, updated Dr.Lateef that pt has been hypotensive this am, have had to titrate up on levophed (45 mcg) to maintain map >65, and have also cut back on ultrafiltration.  Per Dr. Holley Raring may add neosynephrine if requiring more than 50 mcg levophed.

## 2014-10-17 ENCOUNTER — Encounter: Payer: Self-pay | Admitting: Certified Registered Nurse Anesthetist

## 2014-10-17 ENCOUNTER — Inpatient Hospital Stay: Payer: Medicare Other

## 2014-10-17 ENCOUNTER — Encounter: Admission: EM | Disposition: E | Payer: Self-pay | Source: Home / Self Care | Attending: Internal Medicine

## 2014-10-17 DIAGNOSIS — R791 Abnormal coagulation profile: Secondary | ICD-10-CM

## 2014-10-17 DIAGNOSIS — L03115 Cellulitis of right lower limb: Secondary | ICD-10-CM

## 2014-10-17 LAB — MAGNESIUM: MAGNESIUM: 1.7 mg/dL (ref 1.7–2.4)

## 2014-10-17 LAB — BASIC METABOLIC PANEL
Anion gap: 9 (ref 5–15)
BUN: 20 mg/dL (ref 6–20)
CO2: 24 mmol/L (ref 22–32)
Calcium: 8.7 mg/dL — ABNORMAL LOW (ref 8.9–10.3)
Chloride: 102 mmol/L (ref 101–111)
Glucose, Bld: 197 mg/dL — ABNORMAL HIGH (ref 65–99)
Potassium: 4.2 mmol/L (ref 3.5–5.1)
Sodium: 135 mmol/L (ref 135–145)

## 2014-10-17 LAB — RENAL FUNCTION PANEL
ANION GAP: 8 (ref 5–15)
Albumin: 1.7 g/dL — ABNORMAL LOW (ref 3.5–5.0)
Albumin: 2.1 g/dL — ABNORMAL LOW (ref 3.5–5.0)
Anion gap: 4 — ABNORMAL LOW (ref 5–15)
BUN: 19 mg/dL (ref 6–20)
BUN: 22 mg/dL — ABNORMAL HIGH (ref 6–20)
CHLORIDE: 105 mmol/L (ref 101–111)
CO2: 24 mmol/L (ref 22–32)
CO2: 27 mmol/L (ref 22–32)
CREATININE: UNDETERMINED mg/dL (ref 0.44–1.00)
Calcium: 8.8 mg/dL — ABNORMAL LOW (ref 8.9–10.3)
Calcium: 8.8 mg/dL — ABNORMAL LOW (ref 8.9–10.3)
Chloride: 102 mmol/L (ref 101–111)
Glucose, Bld: 134 mg/dL — ABNORMAL HIGH (ref 65–99)
Glucose, Bld: 203 mg/dL — ABNORMAL HIGH (ref 65–99)
Phosphorus: UNDETERMINED mg/dL (ref 2.5–4.6)
Potassium: 3.8 mmol/L (ref 3.5–5.1)
Potassium: 4.1 mmol/L (ref 3.5–5.1)
Sodium: 134 mmol/L — ABNORMAL LOW (ref 135–145)
Sodium: 136 mmol/L (ref 135–145)

## 2014-10-17 LAB — CBC
HCT: 27.4 % — ABNORMAL LOW (ref 35.0–47.0)
HCT: 24.2 % — ABNORMAL LOW (ref 35.0–47.0)
Hemoglobin: 9 g/dL — ABNORMAL LOW (ref 12.0–16.0)
Hemoglobin: 7.8 g/dL — ABNORMAL LOW (ref 12.0–16.0)
MCH: 32.4 pg (ref 26.0–34.0)
MCH: 32.1 pg (ref 26.0–34.0)
MCHC: 32.7 g/dL (ref 32.0–36.0)
MCHC: 32.2 g/dL (ref 32.0–36.0)
MCV: 99 fL (ref 80.0–100.0)
MCV: 99.5 fL (ref 80.0–100.0)
Platelets: 69 10*3/uL — ABNORMAL LOW (ref 150–440)
Platelets: 157 10*3/uL (ref 150–440)
RBC: 2.77 MIL/uL — ABNORMAL LOW (ref 3.80–5.20)
RBC: 2.43 MIL/uL — ABNORMAL LOW (ref 3.80–5.20)
RDW: 26.9 % — AB (ref 11.5–14.5)
RDW: 26.5 % — AB (ref 11.5–14.5)
WBC: 23.3 10*3/uL — ABNORMAL HIGH (ref 3.6–11.0)
WBC: 25.7 10*3/uL — ABNORMAL HIGH (ref 3.6–11.0)

## 2014-10-17 LAB — APTT
APTT: 48 s — AB (ref 24–36)
APTT: 47 s — AB (ref 24–36)

## 2014-10-17 LAB — CULTURE, BLOOD (ROUTINE X 2)
Culture: NO GROWTH
Culture: NO GROWTH
SPECIAL REQUESTS: NORMAL
Special Requests: NORMAL

## 2014-10-17 LAB — GLUCOSE, CAPILLARY
GLUCOSE-CAPILLARY: 161 mg/dL — AB (ref 65–99)
GLUCOSE-CAPILLARY: 176 mg/dL — AB (ref 65–99)
GLUCOSE-CAPILLARY: 177 mg/dL — AB (ref 65–99)
GLUCOSE-CAPILLARY: 182 mg/dL — AB (ref 65–99)
Glucose-Capillary: 132 mg/dL — ABNORMAL HIGH (ref 65–99)
Glucose-Capillary: 147 mg/dL — ABNORMAL HIGH (ref 65–99)
Glucose-Capillary: 159 mg/dL — ABNORMAL HIGH (ref 65–99)
Glucose-Capillary: 170 mg/dL — ABNORMAL HIGH (ref 65–99)
Glucose-Capillary: 200 mg/dL — ABNORMAL HIGH (ref 65–99)
Glucose-Capillary: 155 mg/dL — ABNORMAL HIGH (ref 65–99)

## 2014-10-17 SURGERY — CREATION, TRACHEOSTOMY
Anesthesia: Choice

## 2014-10-17 MED ORDER — DEXTROSE 10 % IV SOLN: INTRAVENOUS | Status: DC | PRN

## 2014-10-17 MED ORDER — VANCOMYCIN HCL IN DEXTROSE 750-5 MG/150ML-% IV SOLN
750.0000 mg | Freq: Once | INTRAVENOUS | Status: DC
  Filled 2014-10-17: qty 150

## 2014-10-17 MED ORDER — LIDOCAINE-EPINEPHRINE (PF) 1 %-1:200000 IJ SOLN
INTRAMUSCULAR | Status: AC
Start: 2014-10-17 — End: 2014-10-17
  Filled 2014-10-17: qty 30

## 2014-10-17 MED ORDER — INSULIN GLARGINE 100 UNIT/ML ~~LOC~~ SOLN
10.0000 [IU] | SUBCUTANEOUS | Status: DC
  Administered 2014-10-17 (×2): 10 [IU] via SUBCUTANEOUS
  Filled 2014-10-17 (×3): qty 0.1

## 2014-10-17 MED ORDER — INSULIN ASPART 100 UNIT/ML ~~LOC~~ SOLN
2.0000 [IU] | SUBCUTANEOUS | Status: DC
  Administered 2014-10-17 – 2014-10-18 (×4): 4 [IU] via SUBCUTANEOUS
  Administered 2014-10-18: 2 [IU] via SUBCUTANEOUS
  Filled 2014-10-17 (×4): qty 4
  Filled 2014-10-17: qty 2

## 2014-10-17 MED ORDER — PHENYLEPHRINE HCL 10 MG/ML IJ SOLN
0.0000 ug/min | INTRAVENOUS | Status: DC
  Administered 2014-10-17: 100 ug/min via INTRAVENOUS
  Administered 2014-10-17: 50 ug/min via INTRAVENOUS
  Administered 2014-10-18: 350 ug/min via INTRAVENOUS
  Administered 2014-10-18 (×4): 400 ug/min via INTRAVENOUS
  Filled 2014-10-17 (×8): qty 4

## 2014-10-17 MED ORDER — SODIUM CHLORIDE 0.9 % IV SOLN
INTRAVENOUS | Status: DC
  Filled 2014-10-17: qty 2.5

## 2014-10-17 MED ORDER — TRACE MINERALS CR-CU-MN-SE-ZN 10-1000-500-60 MCG/ML IV SOLN
INTRAVENOUS | Status: DC
  Administered 2014-10-17: 19:00:00 via INTRAVENOUS
  Filled 2014-10-17: qty 1680

## 2014-10-17 MED ORDER — INSULIN GLARGINE 100 UNIT/ML ~~LOC~~ SOLN
5.0000 [IU] | Freq: Once | SUBCUTANEOUS | Status: AC
  Administered 2014-10-17: 5 [IU] via SUBCUTANEOUS
  Filled 2014-10-17: qty 0.05

## 2014-10-17 MED ORDER — HYDROCORTISONE NA SUCCINATE PF 100 MG IJ SOLR
50.0000 mg | Freq: Four times a day (QID) | INTRAMUSCULAR | Status: DC
  Administered 2014-10-17 – 2014-10-18 (×4): 50 mg via INTRAVENOUS
  Filled 2014-10-17 (×4): qty 2

## 2014-10-17 MED ORDER — SODIUM CHLORIDE 0.9 % IV SOLN
Freq: Once | INTRAVENOUS | Status: AC
  Administered 2014-10-17: 09:00:00 via INTRAVENOUS

## 2014-10-17 MED ORDER — SODIUM CHLORIDE 0.9 % IV SOLN
Freq: Once | INTRAVENOUS | Status: AC
  Administered 2014-10-17: 11:00:00 via INTRAVENOUS

## 2014-10-17 MED ORDER — INSULIN GLARGINE 100 UNIT/ML ~~LOC~~ SOLN
15.0000 [IU] | Freq: Every day | SUBCUTANEOUS | Status: DC
  Filled 2014-10-17 (×2): qty 0.15

## 2014-10-17 MED ORDER — INSULIN ASPART 100 UNIT/ML ~~LOC~~ SOLN
1.0000 [IU] | SUBCUTANEOUS | Status: DC
  Administered 2014-10-17 (×3): 2 [IU] via SUBCUTANEOUS
  Filled 2014-10-17 (×3): qty 2

## 2014-10-17 MED ORDER — VANCOMYCIN HCL IN DEXTROSE 1-5 GM/200ML-% IV SOLN
1000.0000 mg | INTRAVENOUS | Status: DC
  Administered 2014-10-17: 1000 mg via INTRAVENOUS
  Filled 2014-10-17 (×2): qty 200

## 2014-10-17 SURGICAL SUPPLY — 20 items
BLADE SURG 15 STRL LF DISP TIS (BLADE) ×1 IMPLANT
BLADE SURG 15 STRL SS (BLADE)
BLADE SURG SZ11 CARB STEEL (BLADE) ×1 IMPLANT
CANISTER SUCT 1200ML W/VALVE (MISCELLANEOUS) ×1 IMPLANT
GLOVE EXAM LX STRL 7.5 (GLOVE) ×1 IMPLANT
GOWN STRL REUS W/ TWL LRG LVL3 (GOWN DISPOSABLE) ×2 IMPLANT
GOWN STRL REUS W/TWL LRG LVL3 (GOWN DISPOSABLE)
HARMONIC SCALPEL FOCUS (MISCELLANEOUS) ×1 IMPLANT
NS IRRIG 500ML POUR BTL (IV SOLUTION) ×1 IMPLANT
PACK HEAD/NECK (MISCELLANEOUS) ×1 IMPLANT
PAD GROUND ADULT SPLIT (MISCELLANEOUS) ×1 IMPLANT
SPONGE EXCIL AMD DRAIN 4X4 6P (MISCELLANEOUS) ×1 IMPLANT
SUCTION FRAZIER TIP 10 FR DISP (SUCTIONS) ×1 IMPLANT
SUT ETHILON 2 0 FS 18 (SUTURE) ×1 IMPLANT
SUT SILK 2 0 (SUTURE)
SUT SILK 2-0 18XBRD TIE 12 (SUTURE) ×1 IMPLANT
SUT VIC AB 4-0 RB1 27 (SUTURE)
SUT VIC AB 4-0 RB1 27X BRD (SUTURE) ×1 IMPLANT
TUBE TRACH SHILEY  6 DIST  CUF (TUBING) ×1 IMPLANT
TUBE TRACH SHILEY 8 DIST CUF (TUBING) ×1 IMPLANT

## 2014-10-17 NOTE — Progress Notes (Signed)
[place] NOTE  Pharmacy Consult for Electrolyte and CRRT medication management Indication: ICU Status, CRRT, TPN   No Known Allergies  Patient Measurements: Height: 5\' 1"  (154.9 cm) Weight: 162 lb 0.6 oz (73.5 kg) IBW/kg (Calculated) : 47.8   Vital Signs: Temp: 98.1 F (36.7 C) (06/13 0545) Temp Source: Oral (06/13 0545) BP: 98/56 mmHg (06/13 0826) Pulse Rate: 83 (06/13 0826) Intake/Output from previous day: 06/12 0701 - 06/13 0700 In: 3900.3 [I.V.:1153.3; Blood:627; NG/GT:110; IV Piggyback:400; TPN:1610] Out: 3170  Intake/Output from this shift:   Vent settings for last 24 hours: Vent Mode:  [-] SIMV FiO2 (%):  [35 %] 35 % Set Rate:  [8 bmp] 8 bmp Vt Set:  [500 mL] 500 mL PEEP:  [5 cmH20] 5 cmH20 Pressure Support:  [20 cmH20] 20 cmH20 Plateau Pressure:  [18 cmH20] 18 cmH20  Labs:  Recent Labs  10/14/14 1341  10/15/14 0307  10/16/14 0002 10/16/14 0413 10/16/14 0754 10/16/14 1627 10/16/14 2353 11/01/2014 0633  WBC  --   --  13.4*  --   --  12.0*  --   --   --  23.3*  HGB  --   --  8.9*  --   --  10.2*  --   --   --  9.0*  HCT  --   --  27.3*  --   --  31.2*  --   --   --  27.4*  PLT  --   --  70*  --   --  72*  --   --   --  69*  APTT  --   --  51*  --   --  48*  --   --   --  48*  CREATININE SEE COMMENTS  < >  --   < > SEE COMMENTS SEE COMMENTS SEE COMMENTS SEE COMMENTS NOT VALID SEE COMMENTS  MG  --   --  1.8  --   --  1.7  --   --   --  1.7  PHOS  --   < >  --   < > SEE COMMENTS  --  SEE COMMENTS SEE COMMENTS  --   --   ALBUMIN 1.8*  < >  --   < > 1.8*  --  1.9* 1.7* 1.7*  --   PROT 5.8*  --   --   --   --   --   --   --   --   --   AST 362*  --   --   --   --   --   --   --   --   --   ALT 107*  --   --   --   --   --   --   --   --   --   ALKPHOS 1291*  --   --   --   --   --   --   --   --   --   BILITOT 27.7*  --   --   --   --   --   --   --   --   --   < > = values in this interval not displayed. CrCl cannot be calculated  (Patient has no serum creatinine result on file.).   Recent Labs  10/15/2014 0248 10/16/2014 0402 10/29/2014 0727  GLUCAP 159* 170* 182*    Microbiology: Recent Results (from  the past 720 hour(s))  Blood culture (routine x 2)     Status: None   Collection Time: 09/05/2014 12:45 AM  Result Value Ref Range Status   Specimen Description BLOOD  Final   Special Requests NONE  Final   Culture  Setup Time   Final    GRAM POSITIVE COCCI IN CHAINS IN BOTH AEROBIC AND ANAEROBIC BOTTLES CRITICAL RESULT CALLED TO, READ BACK BY AND VERIFIED WITH: PAM CRAWFORD AT 1406 09/15/2014 DV    Culture STREPTOCOCCUS SPECIES  Final   Report Status 10/01/2014 FINAL  Final   Organism ID, Bacteria STREPTOCOCCUS SPECIES  Final      Susceptibility   Streptococcus species - MIC (ETEST)*    ERYTHROMYCIN Value in next row Resistant      RESISTANT6    PENICILLIN Value in next row Intermediate      INTERMEDIATE0.25    CEFTRIAXONE Value in next row Sensitive      SENSITIVE0.047    VANCOMYCIN Value in next row Sensitive      SENSITIVE1.0    LEVOFLOXACIN Value in next row Sensitive      SENSITIVE1.0    * STREPTOCOCCUS SPECIES  Blood culture (routine x 2)     Status: None   Collection Time: 09/27/2014 12:45 AM  Result Value Ref Range Status   Specimen Description BLOOD  Final   Special Requests NONE  Final   Culture  Setup Time   Final    GRAM POSITIVE COCCI IN CHAINS IN BOTH AEROBIC AND ANAEROBIC BOTTLES CRITICAL RESULT CALLED TO, READ BACK BY AND VERIFIED WITH: PAM CRAWFORD AT 1406 09/27/2014 DV    Culture   Final    STREPTOCOCCUS SPECIES REFER TO ACCESSION [id number] COLLECTED ON 09/17/2014 AT 0045 FOR SENSITIVITIES    Report Status 09/30/2014 FINAL  Final  Culture, blood (routine x 2)     Status: None   Collection Time: 10/04/14 11:50 AM  Result Value Ref Range Status   Specimen Description BLOOD  Final   Special Requests NONE  Final   Culture NO GROWTH 5 DAYS  Final   Report Status 10/09/2014 FINAL  Final   Culture, blood (routine x 2)     Status: None   Collection Time: 10/04/14 12:00 PM  Result Value Ref Range Status   Specimen Description BLOOD  Final   Special Requests NONE  Final   Culture NO GROWTH 5 DAYS  Final   Report Status 10/09/2014 FINAL  Final  Culture, respiratory (NON-Expectorated)     Status: None   Collection Time: 10/04/14  2:00 PM  Result Value Ref Range Status   Specimen Description INDUCED SPUTUM  Final   Special Requests Normal  Final   Gram Stain   Final    EXCELLENT SPECIMEN - 90-100% WBCS MANY WBC SEEN MANY YEAST    Culture MODERATE GROWTH CANDIDA ALBICANS  Final   Report Status 10/07/2014 FINAL  Final  Culture, blood (routine x 2)     Status: None   Collection Time: 10/12/14  1:34 PM  Result Value Ref Range Status   Specimen Description BLOOD  Final   Special Requests Normal  Final   Culture NO GROWTH 5 DAYS  Final   Report Status 10/25/2014 FINAL  Final  Culture, blood (routine x 2)     Status: None   Collection Time: 10/12/14  4:30 PM  Result Value Ref Range Status   Specimen Description BLOOD  Final   Special Requests Normal  Final  Culture NO GROWTH 5 DAYS  Final   Report Status 10/12/2014 FINAL  Final    Medications:  Scheduled:  . sodium chloride   Intravenous Once  . sodium chloride   Intravenous Once  . antiseptic oral rinse  7 mL Mouth Rinse QID  . budesonide (PULMICORT) nebulizer solution  0.5 mg Nebulization BID  . chlorhexidine  15 mL Mouth Rinse BID  . erythromycin  250 mg Intravenous 3 times per day  . insulin aspart  1-3 Units Subcutaneous 6 times per day  . insulin glargine  10 Units Subcutaneous Q24H  . ipratropium-albuterol  3 mL Nebulization QID  . lactulose  20 g Oral BID  . levofloxacin (LEVAQUIN) IV  500 mg Intravenous Q24H  . midodrine  10 mg Oral TID  . pantoprazole (PROTONIX) IV  40 mg Intravenous Q12H  . sodium chloride  10-40 mL Intracatheter Q12H  . sucralfate  1 g Oral 4 times per day   Infusions:  . Marland KitchenTPN  (CLINIMIX-E) Adult 70 mL/hr at 10/05/2014 0800  . fentaNYL infusion INTRAVENOUS 100 mcg/hr (10/06/2014 0800)  . norepinephrine (LEVOPHED) Adult infusion 45.973 mcg/min (10/30/2014 0800)  . pureflow 2,500 mL/hr at 10/22/2014 0439  . vasopressin (PITRESSIN) infusion - *FOR SHOCK* 0.03 Units/min (10/16/2014 0800)   PRN: acetaminophen **OR** acetaminophen, albuterol, heparin, polyethylene glycol powder, sodium chloride  Assessment: 64 yo female requiring mechanical ventilation, CRRT, and TPN. Patient is scheduled for trach on 6/13.    Plan:   1. Electrolytes/TPN: No replacement needed at this time. Will continue to monitor. Patient's insulin drip discontinued over night. Patient only placed on sliding scale insulin. Would recommend placing patient back on insulin drip at this time as patient has planned trach and possible PEG this week. Patient's previous two blood glucose levels > 180.   2. CRRT Medication adjustment: no further adjustments warranted at this time.   3. Levofloxacin: Will continue patient on levofloxacin 500mg  IV q24hr through stop date on 6/14. Will need to adjust dosing if CRRT is stopped.    5. Constipation: Patient with bowel movement this am. Will continue with ordered erythromycin 250mg  IV Q8hr and lactulose 20g VT BID. Will continue to monitor.   Pharmacy will continue to monitor and adjust per consult.     Derrian Poli L 11/01/2014,9:57 AM

## 2014-10-17 NOTE — Consult Note (Signed)
I met with pt's son and daughter-in-law. Updated them on pt's current condition. Son seems to acknowledge that pt may not recover from this illness to have a meaningful quality of life. He and his wife will discuss further and son will let me know if/when he wants to transition to comfort care.

## 2014-10-17 NOTE — Progress Notes (Signed)
[facility] [facility] [place] NAME: [name]    MR#:  [id number]  DATE OF BIRTH:  1950/09/30  SUBJECTIVE:  CHIEF COMPLAINT:   Chief Complaint  Patient presents with  . Altered Mental Status    Critically ill. On pressors. CRRT. Full vent support. Sedated. TPN Have more hypotension and hypothermia ( 10/31/2014) WBCs rise from yesterday.  REVIEW OF SYSTEMS:    ROS  Unable to give history due to  patient intubated   DRUG ALLERGIES:  No Known Allergies  VITALS:  Blood pressure 93/62, pulse 98, temperature 99.1 F (37.3 C), temperature source Rectal, resp. rate 20, height 5\' 1"  (1.549 m), weight 73.5 kg (162 lb 0.6 oz), SpO2 93 %.  PHYSICAL EXAMINATION:   Physical Exam  GENERAL:  64 y.o.-year-old patient lying in the bed critically ill EYES: Pupils equal, round, reactive to light and accommodation. + scleral icterus.  HEENT: Head atraumatic, normocephalic.Intubated NECK:  Supple, no jugular venous distention. No thyroid enlargement, no tenderness. Right IJ . Right femoral Dialysis catheter. LUNGS: Coarse breath sounds, poor air movement, vent CARDIOVASCULAR: S1, S2 normal. No murmurs. ABDOMEN: Soft, nontender, nondistended. Bowel sounds decreased. No organomegaly or mass. EXTREMITIES: No cyanosis, clubbing. Anasarca. NEUROLOGIC:  Sedated. SKIN:  Superficial ulcer right thigh. Decubitus ulcer   LABORATORY PANEL:   CBC  Recent Labs Lab 10/12/2014 0633  WBC 23.3*  HGB 9.0*  HCT 27.4*  PLT 69*   ------------------------------------------------------------------------------------------------------------------  Chemistries   Recent Labs Lab 10/14/14 1341  10/06/2014 0633  NA 134*  < > 135  K 4.4  < > 4.2  CL 103  < > 102  CO2 25  < > 24  GLUCOSE 220*  < > 197*  BUN 32*  < > 20  CREATININE SEE COMMENTS  < > SEE COMMENTS  CALCIUM 8.5*  < > 8.7*  MG  --   < > 1.7  AST 362*  --   --   ALT 107*  --   --   ALKPHOS  1291*  --   --   BILITOT 27.7*  --   --   < > = values in this interval not displayed. ------------------------------------------------------------------------------------------------------------------ APTT- 48.  RADIOLOGY:  Dg Chest Port 1 View  10/07/2014   IMPRESSION: 1. Worsened bilateral airspace opacities. In the context of the patient's moderate enlargement of the cardiopericardial silhouette, the appearance is suspicious for acute pulmonary edema. 2. The endotracheal tube has been successfully retracted, and the tubes and lines appear satisfactorily positioned.   Electronically Signed   By: Van Clines M.D.   On: 10/07/2014 08:42     ASSESSMENT AND PLAN:   64 year old African American female history of nephrotic syndrome, amyloidosis, essential hypertension presenting on 5/25 with altered mental status/confusion after apparently falling out of bed. Admitted for septic shock  * Septic shock with right flank/thigh cellulitis with streptococcus bacteremia - ID following - Changed to Levaquin till 10/21/14 and clindamycin stopped. On erythromycin, Continue pressors. Off steroids. - Echo- No vegetations. Repeat blood cultures negative to date. - on 10/08/2014- again had hypothermia, hypotension, rise in WBCs- sent sepsis work ups and added vancomycin.  * Acute renal failure over CKD3 due to ATN. From septic shock in the setting of amyloidosis On CRRT. Appreciate nephrology help. Severe hypoalbuminemia. Will likely progress to ESRD.  * Acute liver failure- Due to sepsis - No improvement.  CT abd/pelvis showed no biliary dilation or obstruction Severe hyperbilirubinemia > 30. Gastroenterology  following.  lactulose Have coagulopathy due to that- makes her even higher risk for surgery. Transfused FFp as needed for surgery.  * Ileus Improving on Xray But significant OP in NG still. Continue LIS TFs when able to tolerate.  * Elevated INR - Due to liver failure. DIC unlikely  per oncology. 4 units FFP given 6/1 minimal improvement in INR 2 FFP given 10/14/2014 morning- still APTT is high- so given 2 more FFP and Plt transfusion.   Procedure cancelled by ENT due to coagulopathy.  * Acute encephalopathy - Due to acute illness. CT head nothing acute.  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 Plan was for chemo as OP with Dr. Grayland Ormond but now on hold due to acute illness. Oncology following  * Upper GI bleed with Acute blood loss  anemia over AOCD EGD showed severe esophagitis. On protonix  IV Transfused 4 units this admission.  * Type 2 diabetes, uncomplicated: Hold oral agents, at insulin slide scale every 6 hours Accu-Chek  * Essential hypertension: Held all meds  * Venous thromboembolism prophylactic: Heparin subcutaneous stopped due to high INR and low platelets and GIB  * Nutrition TPN  * Acute respiratory failure -Full vent support - due to suspected aspiration of gastrointestinal content - Pulmonology following - Full ventilator support  * Trach and PEG ENT consulted. Will be high risk for any procedure with co-morbidities. Scheduled but cancelled on 10/26/2014  * Decubitus ulcer  CODE STATUS: FULL CODE As per discussion with her daughter in law his morning- they are leaning towards comfort care- I suggested to meeet with palliative care today.  Multi organ failure. High risk for cardiac arrest and death  discussed with pt's daughter in law in room.   TOTAL CRITICAL CARE TIME TAKING CARE OF THIS PATIENT: 40 minutes.    [name] M.D on 11/02/2014 at 4:47 PM  Between 7am to 6pm - Pager - [phone]  After 6pm go to [email or web address] - password [email or web address] [facility]  [facility]  Office  [phone]  CC: Primary care physician; [name], MD

## 2014-10-17 NOTE — Progress Notes (Signed)
Note not done as pt deceased

## 2014-10-17 NOTE — Consult Note (Signed)
[name], [place] [id number] [birth year] Gina Basta, MD  Reason for Consult: Tracheostomy  HPI: The patient was scheduled for tracheostomy this morning because of prolonged intubation. She has Medical issues and has had elevated clotting times. She received 2 units of FFP but continues to have elevated APTT at 48.  Allergies: No Known Allergies  ROS: Review of systems normal other than 12 systems except per HPI.  PMH:  Past Medical History  Diagnosis Date  . HTN (hypertension)   . Diabetes mellitus type II   . Obesity   . Arthritis   . Chronic kidney disease   . Amyloidosis     FH:  Family History  Problem Relation Age of Onset  . Arthritis Mother   . Hypertension Mother   . Cancer Mother     breast, lung  . Diabetes Mother   . Hypertension Father     SH:  History   Social History  . Marital Status: Single    Spouse Name: N/A  . Number of Children: 1  . Years of Education: N/A   Occupational History  . Not on file.   Social History Main Topics  . Smoking status: Never Smoker   . Smokeless tobacco: Never Used  . Alcohol Use: No  . Drug Use: No  . Sexual Activity: Not on file   Other Topics Concern  . Not on file   Social History Narrative    PSH:  Past Surgical History  Procedure Laterality Date  . Total abdominal hysterectomy  10/1999    fibroids  . Combined abdominoplasty and liposuction  07/2000  . Total knee arthroplasty Right 09/28/2013    Procedure: RIGHT TOTAL KNEE ARTHROPLASTY;  Surgeon: Mauri Pole, MD;  Location: [facility];  Service: Orthopedics;  Laterality: Right;    Physical  Exam: Pt sedated in ICU, orally intubated.   A/P: The patient's clotting functions are not well controlled and put her at very high risk for bleeding postoperatively. I discussed this at length with the daughter-in-law. She understands the situation and will discuss this with her husband who is the patient's son. They will try to decide today if they want to  continue on with a full code. Right now the patient is going to continue to get further FFP to see if clotting studies will improved for a possible tracheostomy in another 1-2 days. If they decide to change her to a no showed and decrease ventilatory support, then a tracheostomy would not be necessary. They will let us know.   [name] H 10/17/2014 11:04 AM

## 2014-10-17 NOTE — Consult Note (Signed)
Palliative Medicine Inpatient Consult Follow Up Note   Name: [name] Date: 10/12/2014 MRN: [id number]  DOB: 01/02/1951  Referring Physician: Altamese Dilling, MD  Palliative Care consult requested for this 64 y.o. female for goals of medical therapy in patient with amyloidosis, CKD  stage III, DM, HTN admitted with sepsis  [name] is intubated, sedated, on pressors. Trach postponed secondary to elevated PTT. Family at bedside.     REVIEW OF SYSTEMS:  Patient is not able to provide ROS  CODE STATUS: Full code   PAST MEDICAL HISTORY: Past Medical History  Diagnosis Date  . HTN (hypertension)   . Diabetes mellitus type II   . Obesity   . Arthritis   . Chronic kidney disease   . Amyloidosis     PAST SURGICAL HISTORY:  Past Surgical History  Procedure Laterality Date  . Total abdominal hysterectomy  10/1999    fibroids  . Combined abdominoplasty and liposuction  07/2000  . Total knee arthroplasty Right 09/28/2013    Procedure: RIGHT TOTAL KNEE ARTHROPLASTY;  Surgeon: Shelda Pal, MD;  Location: [facility];  Service: Orthopedics;  Laterality: Right;    Vital Signs: BP 89/49 mmHg  Pulse 90  Temp(Src) 96.8 F (36 C) (Rectal)  Resp 27  Ht 5\' 1"  (1.549 m)  Wt 73.5 kg (162 lb 0.6 oz)  BMI 30.63 kg/m2  SpO2 99% Filed Weights   10/15/14 0500 10/16/14 0430 10/11/2014 0500  Weight: 75.1 kg (165 lb 9.1 oz) 73.9 kg (162 lb 14.7 oz) 73.5 kg (162 lb 0.6 oz)    Estimated body mass index is 30.63 kg/(m^2) as calculated from the following:   Height as of this encounter: 5\' 1"  (1.549 m).   Weight as of this encounter: 73.5 kg (162 lb 0.6 oz).  PHYSICAL EXAM: General: Critically ill appearing HEENT: ETT in place, scleral icterus Neck: Trachea midline  Cardiovascular: regular rate and rhythm Pulmonary/Chest: coarse BS's ant fields Abdominal: firm, nontender, hypoactive bowel sounds GU: Foley present, anuric Extremities: 3 + pitting edema BLE's and  BUE's Neurological: no response to verbal stimuli Skin: R flank wound with gauze, CDI Psychiatric: sedated LABS: CBC:    Component Value Date/Time   WBC 23.3* 10/05/2014 0633   WBC 9.2 08/22/2014 1712   HGB 9.0* 10/13/2014 0633   HGB 12.3 08/22/2014 1712   HCT 27.4* 11/03/2014 0633   HCT 38.3 08/22/2014 1712   PLT 69* 10/31/2014 0633   PLT 503* 08/22/2014 1712   MCV 99.0 10/16/2014 0633   MCV 87 08/22/2014 1712   NEUTROABS 21.1* 10/02/2014 1030   NEUTROABS 4.0 08/22/2014 1712   LYMPHSABS 1.5 10/02/2014 1030   LYMPHSABS 4.2* 08/22/2014 1712   MONOABS 2.0* 10/02/2014 1030   MONOABS 0.9 08/22/2014 1712   EOSABS 0.0 10/02/2014 1030   EOSABS 0.0 08/22/2014 1712   BASOSABS 0.0 10/02/2014 1030   BASOSABS 0.1 08/22/2014 1712   Comprehensive Metabolic Panel:    Component Value Date/Time   NA 135 10/31/2014 0633   NA 136 08/22/2014 1712   K 4.2 10/26/2014 0633   K 2.6* 08/22/2014 1712   CL 102 10/21/2014 0633   CL 92* 08/22/2014 1712   CO2 24 10/15/2014 0633   CO2 35* 08/22/2014 1712   BUN 20 10/19/2014 0633   BUN 29* 08/22/2014 1712   CREATININE SEE COMMENTS 11/02/2014 0633   CREATININE 1.78* 08/22/2014 1712   GLUCOSE 197* 10/26/2014 0633   GLUCOSE 167* 08/22/2014 1712   CALCIUM 8.7* 10/17/2014 08/24/2014  CALCIUM 8.2* 08/22/2014 1712   AST 362* 10/14/2014 1341   AST 40 08/22/2014 1712   ALT 107* 10/14/2014 1341   ALT 22 08/22/2014 1712   ALKPHOS 1291* 10/14/2014 1341   ALKPHOS 294* 08/22/2014 1712   BILITOT 27.7* 10/14/2014 1341   PROT 5.8* 10/14/2014 1341   PROT 5.0* 08/22/2014 1712   ALBUMIN 1.7* 10/16/2014 2353   ALBUMIN 1.7* 08/22/2014 1712    IMPRESSION: [name] is a 64 y.o. female with amyloidosis (markers for multiple myeloma), CKD stage III, DM, HTN, h/o uterine fibroids, and s/p total abdominal hysterectomy.She was admitted 10/01/2014 with sepsis secondary to R.thigh cellulitis/strep bacteremia. She also had ARF requiring CRRT. Hospital course further  complicated by VDRF (intubated 5/30), liver failure and hypotension requiring multiple pressors. She is getting TPN due to ileus.   Plan was for trach today. However, procedure postponed due to elevated PTT. In addition, pt is hypotensive, hypothermic with a leukocytosis.   I met with pt's daughter-in-law. She is aware of pt's critical illness and poor prognosis. She asks that I meet with pt's son again to discuss goals of therapy. Will meet with son when he arrives this afternoon.   PLAN: Family meeting   More than 50% of the visit was spent in counseling/coordination of care: YES  Time spent: 45 minutes

## 2014-10-17 NOTE — Progress Notes (Signed)
Pt remains on CRRT and anuric, levophed, vasopressin, and neosynephrine added to maintain MAP>65.  Pt hypothermic most of shift, was on warming blanket, blanket off currently.  Pt unable to have tracheostomy placed due to elevated aptt and low platelets.  Pt has received 2 units FFP, platelets currenlty infusing.  Blood cultures drawn, pt started on vancomycin due to elevated WBC, increase in vasopressor requirements, and hypothermia.  Pt daughter-in-law Andee Poles has requested that Dr. Ermalinda Memos speak with pt son regarding code status and direction of care, considering comfort care.  Dr Phifer currently having family meeting with pt son and daughter-in-law, awaiting family decisions regarding direction of care.

## 2014-10-17 NOTE — Progress Notes (Signed)
Patient alert at times, squeezing nurses hands when asked, able to wiggle toes at times.  She has nodding a couple of times when asked if she was hurting. Other times remains sedated on fentanyl @ 161mcg.   Has reached for the ETT several times.  Received 2 units FFP.  Tolerated well.  Remains on CRRT.  22mls of bloody urine for this shift.  Unable to titrate levophed down, currently @ 12mcg.  Vasopressin continues @ 0.03 units/min.

## 2014-10-17 NOTE — Progress Notes (Signed)
Nutrition Follow-up  INTERVENTION:   (PN): recommend continuing TPN at goal rate EN: recommend trial of  EN when clinically feasible post trach  NUTRITION DIAGNOSIS:  Inadequate oral intake related to inability to eat as evidenced by NPO status. Being addressed via TPN  GOAL:   (PN): tolerance EN: initiation when clinically feasible    MONITOR:   (PN, EN, Electrolyte and renal profile, Anthropometric, Digestive system )   ASSESSMENT:  Pt remains on vent, on CRRT. Remains on 2 pressors, on 46 mcg/min levophed. Plan for trach placement today  PN: 5%AA/15%Dextrose at goal rate of 70 ml/hr  Digestive system: +small BM this AM, no documented NG output  Electrolyte and Renal Profile:  Recent Labs Lab 10/15/14 0307  10/16/14 0002 10/16/14 0413 10/16/14 0754 10/16/14 1627 10/16/14 2353 10/26/2014 0633  BUN  --   < > 23* 23* 22* 25* 22* 20  CREATININE  --   < > SEE COMMENTS SEE COMMENTS SEE COMMENTS SEE COMMENTS NOT VALID SEE COMMENTS  NA  --   < > 134* 136 136 134* 136 135  K  --   < > 3.9 4.3 4.4 4.4 4.1 4.2  MG 1.8  --   --  1.7  --   --   --  1.7  PHOS  --   < > SEE COMMENTS  --  SEE COMMENTS SEE COMMENTS  --   --   < > = values in this interval not displayed. Glucose Profile:  Recent Labs  10/08/2014 0248 10/08/2014 0402 10/23/2014 0727  GLUCAP 159* 170* 182*   Nutritional Anemia Profile:  CBC Latest Ref Rng 11/03/2014 10/16/2014 10/15/2014  WBC 3.6 - 11.0 K/uL 23.3(H) 12.0(H) 13.4(H)  Hemoglobin 12.0 - 16.0 g/dL 9.0(L) 10.2(L) 8.9(L)  Hematocrit 35.0 - 47.0 % 27.4(L) 31.2(L) 27.3(L)  Platelets 150 - 440 K/uL 69(L) 72(L) 70(L)    Meds: levophed, vasopressin  Height:  Ht Readings from Last 1 Encounters:  09/16/2014 5\' 1"  (1.549 m)    Weight:  Wt Readings from Last 1 Encounters:  10/26/2014 162 lb 0.6 oz (73.5 kg)    Filed Weights   10/15/14 0500 10/16/14 0430 10/07/2014 0500  Weight: 165 lb 9.1 oz (75.1 kg) 162 lb 14.7 oz (73.9 kg) 162 lb 0.6 oz (73.5 kg)      BMI:  Body mass index is 30.63 kg/(m^2).  Estimated Nutritional Needs:  Kcal:  (11-14 kcals/d) Using actual wt of 95kg) 1050-1330 kcals/d  Protein:  (2.0-2.5 g/d) Using IbW of 48kg 96-120 g/d  Fluid:  (25-57ml/kg) Using IBW of 48kg 1200-1437ml/d  Skin:   (stage II pressure ulcer on buttock)    Intake/Output Summary (Last 24 hours) at 10/29/2014 1006 Last data filed at 10/08/2014 0700  Gross per 24 hour  Intake 3690.3 ml  Output   2624 ml  Net 1066.3 ml   HIGH Care Level  Kerman Passey MS, RD, LDN [phone] Pager

## 2014-10-17 NOTE — Care Management Note (Addendum)
Case Management Note  Patient Details  Name: [name] MRN: [id number] Date of Birth: [birth year]  Subjective/Objective:    Vented, sedated, on pressors and CRRT.  Palliative working with family.  Trach planned today but held due to PTT.  Following             Action/Plan:   Expected Discharge Date:                  Expected Discharge Plan:     In-House Referral:     Discharge planning Services     Post Acute Care Choice:    Choice offered to:     DME Arranged:    DME Agency:     HH Arranged:    [place]:     Status of Service:     Medicare Important Message Given:  Yes Date Medicare IM Given:  10/05/14 Medicare IM give by:  Orvan July Date Additional Medicare IM Given:  10/10/14 Additional Medicare Important Message give by:  Orvan July  If discussed at Long Length of Stay Meetings, dates discussed:    Additional Comments:  Jolly Mango, RN 10/16/2014, 1:49 PM

## 2014-10-17 NOTE — Progress Notes (Signed)
Inpatient Diabetes Program Recommendations  AACE/ADA: New Consensus Statement on Inpatient Glycemic Control (2013)  Target Ranges:  Prepandial:   less than 140 mg/dL      Peak postprandial:   less than 180 mg/dL (1-2 hours)      Critically ill patients:  140 - 180 mg/dL   Review of Glycemic Control:  Results for [name], [name] (MRN [id number]) as of 10/10/2014 09:46  Ref. Range 10/19/2014 02:48 10/21/2014 04:02 10/30/2014 07:27  Glucose-Capillary Latest Ref Range: 65-99 mg/dL 159 (H) 170 (H) 182 (H)   Note that patient transitioned off insulin drip this morning.  May consider increasing Lantus to 15 units daily and changing Novolog correction to moderate q 4 hours.    Thanks, Adah Perl, RN, BC-ADM Inpatient Diabetes Coordinator Pager [phone] (8a-5p)

## 2014-10-17 NOTE — Progress Notes (Signed)
Spoke with Dr. Anselm Jungling regarding insulin drip order, per protocol pt does not meet criteria to convert to iv insulin drip.  Diabetes nurse recommends upping lantus dose to 15 units daily and using standard SSI scale q4h.  Notified dr. Anselm Jungling of recommendations, per Anselm Jungling may dc insulin drip and place pt on 15 units lantus daily and change to regular SSI.  Will give pt one time dose of 5 units lantus now as pt already received 10 units earlier today.

## 2014-10-17 NOTE — Progress Notes (Signed)
Subjective:  CRRT continued No UOP I/O are almost even . UF ~ 150 cc/hr 11 liters neg since admission so far Trach planned for today Requiring FFP Requiring high dose of levophed and continued on vasopressin  Objective:  Vital signs in last 24 hours:  Temp:  [97.5 F (36.4 C)-98.4 F (36.9 C)] 98.1 F (36.7 C) (06/13 0545) Pulse Rate:  [83-98] 83 (06/13 0826) Resp:  [14-26] 23 (06/13 0826) BP: (69-111)/(34-90) 98/56 mmHg (06/13 0826) SpO2:  [94 %-99 %] 99 % (06/13 0826) FiO2 (%):  [35 %] 35 % (06/13 0759) Weight:  [73.5 kg (162 lb 0.6 oz)] 73.5 kg (162 lb 0.6 oz) (06/13 0500)  Weight change: -0.4 kg (-14.1 oz) Filed Weights   10/15/14 0500 10/16/14 0430 10/10/2014 0500  Weight: 75.1 kg (165 lb 9.1 oz) 73.9 kg (162 lb 14.7 oz) 73.5 kg (162 lb 0.6 oz)    Intake/Output: I/O last 3 completed shifts: In: 5478.5 [I.V.:1691.5; Blood:627; NG/GT:110; IV Piggyback:600] Out: 1610 [Urine:8; Emesis/NG output:850; RUEAV:4098]     Physical Exam: General: Critically ill appearing  HEENT Scleral icterus noted, ETT in place, NGT to suction  Neck RIJ TLC  Pulm/lungs Decreased breath sounds at bases, vent assisted: FiO2 35%  CVS/Heart S1S2 no rubs  Abdomen:  distended, tense, decreased bowel sounds  Extremities: Skin wrinkling noted in b/l LE's; 1+ pitting edema  Neurologic: On the vent  Skin: Bleeding from lips noted  Access: Temp cath- rt femoral 5/25 Dr. Delana Meyer       Basic Metabolic Panel:  Recent Labs Lab 10/12/14 0444  10/13/14 0439  10/14/14 1191  10/14/14 1815  10/15/14 4782  10/15/14 1534 10/16/14 0002 10/16/14 0413 10/16/14 0754 10/16/14 1627 10/16/14 2353  NA 138  < > 138  < > 136  < > 135  < >  --   < > 135 134* 136 136 134* 136  K 4.3  < > 4.6  < > 5.1  < > 4.0  < >  --   < > 3.6 3.9 4.3 4.4 4.4 4.1  CL 102  < > 103  < > 105  < > 104  < >  --   < > 102 103 106 103 104 105  CO2 28  < > 27  < > 27  < > 26  < >  --   < > 26 27 25 28 24 27   GLUCOSE 140*  < >  151*  < > 150*  < > 170*  < >  --   < > 121* 239* 132* 153* 257* 134*  BUN 28*  < > 27*  < > 24*  < > 29*  < >  --   < > 24* 23* 23* 22* 25* 22*  CREATININE SEE COMMENTS  < > NOT VALID  < > NOT VALID  < > SEE COMMENTS  < >  --   < > SEE COMMENTS SEE COMMENTS SEE COMMENTS SEE COMMENTS SEE COMMENTS NOT VALID  CALCIUM 9.6  < > 9.3  < > 8.8*  < > 8.6*  < >  --   < > 8.6* 8.6* 8.8* 8.7* 8.3* 8.8*  MG 1.7  --  1.7  --  1.9  --   --   --  1.8  --   --   --  1.7  --   --   --   PHOS  --   < > SEE COMMENTS  < >  --   < >  SEE COMMENTS  --   --   --  SEE COMMENTS SEE COMMENTS  --  SEE COMMENTS SEE COMMENTS  --   < > = values in this interval not displayed.   CBC:  Recent Labs Lab 10/13/14 0439 10/14/14 0443 10/15/14 0307 10/16/14 0413 10/12/2014 0633  WBC 20.6* 21.5* 13.4* 12.0* 23.3*  HGB 9.8* 9.8* 8.9* 10.2* 9.0*  HCT 29.8* 29.6* 27.3* 31.2* 27.4*  MCV 93.6 95.8 96.3 97.2 99.0  PLT 91* 71* 70* 72* 69*      Microbiology: Results for orders placed or performed during the hospital encounter of 09/17/2014  Blood culture (routine x 2)     Status: None   Collection Time: 09/21/2014 12:45 AM  Result Value Ref Range Status   Specimen Description BLOOD  Final   Special Requests NONE  Final   Culture  Setup Time   Final    GRAM POSITIVE COCCI IN CHAINS IN BOTH AEROBIC AND ANAEROBIC BOTTLES CRITICAL RESULT CALLED TO, READ BACK BY AND VERIFIED WITH: PAM CRAWFORD AT 1406 09/12/2014 DV    Culture STREPTOCOCCUS SPECIES  Final   Report Status 10/01/2014 FINAL  Final   Organism ID, Bacteria STREPTOCOCCUS SPECIES  Final      Susceptibility   Streptococcus species - MIC (ETEST)*    ERYTHROMYCIN Value in next row Resistant      RESISTANT6    PENICILLIN Value in next row Intermediate      INTERMEDIATE0.25    CEFTRIAXONE Value in next row Sensitive      SENSITIVE0.047    VANCOMYCIN Value in next row Sensitive      SENSITIVE1.0    LEVOFLOXACIN Value in next row Sensitive      SENSITIVE1.0    *  STREPTOCOCCUS SPECIES  Blood culture (routine x 2)     Status: None   Collection Time: 09/14/2014 12:45 AM  Result Value Ref Range Status   Specimen Description BLOOD  Final   Special Requests NONE  Final   Culture  Setup Time   Final    GRAM POSITIVE COCCI IN CHAINS IN BOTH AEROBIC AND ANAEROBIC BOTTLES CRITICAL RESULT CALLED TO, READ BACK BY AND VERIFIED WITH: PAM CRAWFORD AT 1406 09/19/2014 DV    Culture   Final    STREPTOCOCCUS SPECIES REFER TO ACCESSION [id number] COLLECTED ON 09/15/2014 AT 0045 FOR SENSITIVITIES    Report Status 09/30/2014 FINAL  Final  Culture, blood (routine x 2)     Status: None   Collection Time: 10/04/14 11:50 AM  Result Value Ref Range Status   Specimen Description BLOOD  Final   Special Requests NONE  Final   Culture NO GROWTH 5 DAYS  Final   Report Status 10/09/2014 FINAL  Final  Culture, blood (routine x 2)     Status: None   Collection Time: 10/04/14 12:00 PM  Result Value Ref Range Status   Specimen Description BLOOD  Final   Special Requests NONE  Final   Culture NO GROWTH 5 DAYS  Final   Report Status 10/09/2014 FINAL  Final  Culture, respiratory (NON-Expectorated)     Status: None   Collection Time: 10/04/14  2:00 PM  Result Value Ref Range Status   Specimen Description INDUCED SPUTUM  Final   Special Requests Normal  Final   Gram Stain   Final    EXCELLENT SPECIMEN - 90-100% WBCS MANY WBC SEEN MANY YEAST    Culture MODERATE GROWTH CANDIDA ALBICANS  Final   Report Status 10/07/2014 FINAL  Final  Culture, blood (routine x 2)     Status: None (Preliminary result)   Collection Time: 10/12/14  1:34 PM  Result Value Ref Range Status   Specimen Description BLOOD  Final   Special Requests Normal  Final   Culture NO GROWTH 4 DAYS  Final   Report Status PENDING  Incomplete  Culture, blood (routine x 2)     Status: None (Preliminary result)   Collection Time: 10/12/14  4:30 PM  Result Value Ref Range Status   Specimen Description BLOOD  Final    Special Requests Normal  Final   Culture NO GROWTH 4 DAYS  Final   Report Status PENDING  Incomplete    Coagulation Studies: No results for input(s): LABPROT, INR in the last 72 hours.  Urinalysis: No results for input(s): COLORURINE, LABSPEC, PHURINE, GLUCOSEU, HGBUR, BILIRUBINUR, KETONESUR, PROTEINUR, UROBILINOGEN, NITRITE, LEUKOCYTESUR in the last 72 hours.  Invalid input(s): APPERANCEUR    Imaging: No results found.   Medications:   . Marland KitchenTPN (CLINIMIX-E) Adult 70 mL/hr at 11/01/2014 0800  . fentaNYL infusion INTRAVENOUS 100 mcg/hr (10/10/2014 0800)  . norepinephrine (LEVOPHED) Adult infusion 45.973 mcg/min (10/06/2014 0800)  . pureflow 2,500 mL/hr at 10/22/2014 0439  . vasopressin (PITRESSIN) infusion - *FOR SHOCK* 0.03 Units/min (10/29/2014 0800)   . sodium chloride   Intravenous Once  . sodium chloride   Intravenous Once  . antiseptic oral rinse  7 mL Mouth Rinse QID  . budesonide (PULMICORT) nebulizer solution  0.5 mg Nebulization BID  . chlorhexidine  15 mL Mouth Rinse BID  . erythromycin  250 mg Intravenous 3 times per day  . insulin aspart  1-3 Units Subcutaneous 6 times per day  . insulin glargine  10 Units Subcutaneous Q24H  . ipratropium-albuterol  3 mL Nebulization QID  . lactulose  20 g Oral BID  . levofloxacin (LEVAQUIN) IV  500 mg Intravenous Q24H  . midodrine  10 mg Oral TID  . pantoprazole (PROTONIX) IV  40 mg Intravenous Q12H  . sodium chloride  10-40 mL Intracatheter Q12H  . sucralfate  1 g Oral 4 times per day   acetaminophen **OR** acetaminophen, albuterol, heparin, polyethylene glycol powder, sodium chloride  Assessment/ Plan:   64 y.o. African [place] female with diabetes mellitus type 2, hypertension, morbid obesity, osteoarthritis, status post right knee surgery, Amyloidosis with nephrotic syndrome with edema, hypoalbuminemia, renal insufficiency and proteinuria.  1. Acute Renal Failure with chronic kidney disease stage III with proteinuria with  nephrotic syndrome secondary Renal Amyloidosis. Now progression to End Stage Renal Disease. Anuric on Continuous Renal Replacement Therapy.  - K bath switched to 4k, K 4.1.  Continue CRRT at present.   Pressor requirement  high  2. Sepsis: Strep in blood cultures 5/25: afebrile. Repeat blood cultures with no growth.  - continued on levofloxacin and erythromycin.  3. Hepatic Failure: LFTs at last check (6/10) still elevated.  Albumin remains low at 1.7.  Continue TPN.  4. Amyloidosis: currently not a candidate for immunotherapy or chemotherapy.  - Appreciate oncology input.   5.  Acute Respiratory failure:  Trach being planned for Monday. D/w Ms Danielle/ Patient's daughter in law  LOS: 74 [name] 6/13/20169:01 AM

## 2014-10-18 LAB — PREPARE FRESH FROZEN PLASMA
UNIT DIVISION: 0
Unit division: 0
Unit division: 0
Unit division: 0

## 2014-10-18 LAB — CBC
HCT: 25.1 % — ABNORMAL LOW (ref 35.0–47.0)
Hemoglobin: 7.8 g/dL — ABNORMAL LOW (ref 12.0–16.0)
MCH: 31.7 pg (ref 26.0–34.0)
MCHC: 31 g/dL — ABNORMAL LOW (ref 32.0–36.0)
MCV: 102.3 fL — AB (ref 80.0–100.0)
Platelets: 124 10*3/uL — ABNORMAL LOW (ref 150–440)
RBC: 2.45 MIL/uL — ABNORMAL LOW (ref 3.80–5.20)
RDW: 27.6 % — ABNORMAL HIGH (ref 11.5–14.5)
WBC: 34 10*3/uL — ABNORMAL HIGH (ref 3.6–11.0)

## 2014-10-18 LAB — RENAL FUNCTION PANEL
Albumin: 1.9 g/dL — ABNORMAL LOW (ref 3.5–5.0)
Anion gap: 9 (ref 5–15)
BUN: 16 mg/dL (ref 6–20)
CO2: 22 mmol/L (ref 22–32)
Calcium: 8.6 mg/dL — ABNORMAL LOW (ref 8.9–10.3)
Chloride: 103 mmol/L (ref 101–111)
Creatinine, Ser: UNDETERMINED mg/dL (ref 0.44–1.00)
Glucose, Bld: 155 mg/dL — ABNORMAL HIGH (ref 65–99)
Phosphorus: UNDETERMINED mg/dL (ref 2.5–4.6)
Potassium: 4.2 mmol/L (ref 3.5–5.1)
Sodium: 134 mmol/L — ABNORMAL LOW (ref 135–145)

## 2014-10-18 LAB — BASIC METABOLIC PANEL
ANION GAP: 12 (ref 5–15)
BUN: 15 mg/dL (ref 6–20)
CALCIUM: 8.5 mg/dL — AB (ref 8.9–10.3)
CO2: 19 mmol/L — ABNORMAL LOW (ref 22–32)
Chloride: 101 mmol/L (ref 101–111)
Creatinine, Ser: UNDETERMINED mg/dL (ref 0.44–1.00)
Glucose, Bld: 170 mg/dL — ABNORMAL HIGH (ref 65–99)
POTASSIUM: 4.3 mmol/L (ref 3.5–5.1)
Sodium: 132 mmol/L — ABNORMAL LOW (ref 135–145)

## 2014-10-18 LAB — PREPARE PLATELET PHERESIS: Unit division: 0

## 2014-10-18 LAB — GLUCOSE, CAPILLARY
GLUCOSE-CAPILLARY: 158 mg/dL — AB (ref 65–99)
GLUCOSE-CAPILLARY: 133 mg/dL — AB (ref 65–99)

## 2014-10-18 LAB — APTT: aPTT: 60 seconds — ABNORMAL HIGH (ref 24–36)

## 2014-10-18 LAB — MAGNESIUM: MAGNESIUM: 1.7 mg/dL (ref 1.7–2.4)

## 2014-10-18 MED ORDER — EPINEPHRINE HCL 1 MG/ML IJ SOLN
0.5000 ug/min | INTRAVENOUS | Status: DC
  Administered 2014-10-18: 5 ug/min via INTRAVENOUS
  Filled 2014-10-18 (×2): qty 4

## 2014-10-18 MED ORDER — EPINEPHRINE HCL 1 MG/ML IJ SOLN
1.0000 mg | Freq: Once | INTRAMUSCULAR | Status: AC
Start: 2014-10-18 — End: 2014-10-18
  Administered 2014-10-18: 1 mg via INTRAVENOUS
  Filled 2014-10-18: qty 1

## 2014-10-18 MED ORDER — MAGNESIUM SULFATE 2 GM/50ML IV SOLN
2.0000 g | Freq: Once | INTRAVENOUS | Status: DC
  Filled 2014-10-18: qty 50

## 2014-10-18 MED ORDER — MIDAZOLAM HCL 2 MG/2ML IJ SOLN
2.0000 mg | INTRAMUSCULAR | Status: DC | PRN
  Administered 2014-10-18: 2 mg via INTRAVENOUS
  Filled 2014-10-18: qty 2

## 2014-10-18 MED FILL — Medication: Qty: 1 | Status: AC

## 2014-10-19 NOTE — Discharge Summary (Signed)
Pt died on 03-Nov-2014  Cause of death - Streptococcus bacteremia - Multi organ failure - Amyloidosis  Hospital course  64 year old African American female history of nephrotic syndrome, amyloidosis, essential hypertension presenting on 5/25 with altered mental status/confusion after apparently falling out of bed. Admitted for septic shock  * Septic shock with right flank/thigh cellulitis with streptococcus bacteremia ( Bl Cx 09/24/2014) - ID following - Changed to Levaquin till 2014/11/03 and clindamycin stopped. On erythromycin, Continue pressors. Was taken Off steroids due to negative blood culture later. - Echo- No vegetations. Repeat blood cultures ( 5/31 and 6/8) negative to date. - on 10/27/2014- again had hypothermia, hypotension, rise in WBCs- sent sepsis work ups and added vancomycin. Blood cx 6/13- is negative. Had to go up on 3 pressors and stress dose steroids, still Blood pressure running low. - Given small dose IV fluid and stopped fluid removal in CRRT.  * Acute renal failure over CKD3 due to ATN. From septic shock in the setting of amyloidosis On CRRT. Appreciate nephrology help. Severe hypoalbuminemia. Will likely progress to ESRD.  * Acute liver failure- Due to sepsis - No improvement.  CT abd/pelvis showed no biliary dilation or obstruction Severe hyperbilirubinemia > 30. Gastroenterology following. lactulose Have coagulopathy due to that- makes her even higher risk for surgery. Transfused FFp as needed for surgery. But sx on 10/09/2014 cancelled due to high APTT.  * Ileus Improving on Xray But significant OP in NG still. Continue LIS TFs when able to tolerate.  * Elevated INR - Due to liver failure. DIC unlikely per oncology. 4 units FFP given 6/1 minimal improvement in INR 2 FFP given 10/28/2014 morning- still APTT is high- so given 2 more FFP and Plt transfusion.  Procedure cancelled by ENT due to coagulopathy. Now family leaning towards comfort care, so she might not  need it.  * Acute encephalopathy - Due to acute illness. CT head nothing acute.  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 Plan was for chemo as OP with Dr. Grayland Ormond but now on hold due to acute illness. Oncology following  * Upper GI bleed with Acute blood loss anemia over AOCD EGD showed severe esophagitis. On protonix IV Transfused 4 units this admission.  * Type 2 diabetes, uncomplicated: Hold oral agents, at insulin slide scale every 6 hours Accu-Chek  * Essential hypertension: Held all meds  * Venous thromboembolism prophylactic: Heparin subcutaneous stopped due to high INR and low platelets and GIB  * Nutrition TPN  * Acute respiratory failure -Full vent support - due to suspected aspiration of gastrointestinal content - Pulmonology following - Full ventilator support  * Trach and PEG ENT consulted. Will be high risk for any procedure with co-morbidities. Scheduled but cancelled on 10/11/2014   Finally family decided for comfort care and terminal extubation done- pt died within few minutes of extubation.

## 2014-10-22 LAB — CULTURE, BLOOD (ROUTINE X 2)
Special Requests: NORMAL
Special Requests: NORMAL

## 2014-11-04 NOTE — Progress Notes (Signed)
[facility] [place] NAME: [name]    MR#:  [id number]  DATE OF BIRTH:  04/24/1951  SUBJECTIVE:  CHIEF COMPLAINT:   Chief Complaint  Patient presents with  . Altered Mental Status    Critically ill. On pressors. CRRT. Full vent support. Sedated. TPN Have more hypotension and hypothermia ( 10/21/2014) WBCs rise from yesterday.  REVIEW OF SYSTEMS:    ROS  Unable to give history due to  patient intubated   DRUG ALLERGIES:  No Known Allergies  VITALS:  Blood pressure 66/49, pulse 48, temperature 98.6 F (37 C), temperature source Rectal, resp. rate 25, height 5\' 1"  (1.549 m), weight 72.757 kg (160 lb 6.4 oz), SpO2 45 %.  PHYSICAL EXAMINATION:   Physical Exam  GENERAL:  64 y.o.-year-old patient lying in the bed critically ill EYES: Pupils equal, round, reactive to light and accommodation. + scleral icterus.  HEENT: Head atraumatic, normocephalic.Intubated NECK:  Supple, no jugular venous distention. No thyroid enlargement, no tenderness. Right IJ . Right femoral Dialysis catheter. LUNGS: Coarse breath sounds, poor air movement, vent CARDIOVASCULAR: S1, S2 normal. No murmurs. ABDOMEN: Soft, nontender, nondistended. Bowel sounds decreased. No organomegaly or mass. EXTREMITIES: No cyanosis, clubbing. Anasarca. NEUROLOGIC:  Sedated. SKIN:  Superficial ulcer right thigh. Decubitus ulcer   LABORATORY PANEL:   CBC  Recent Labs Lab Nov 08, 2014 0500  WBC 34.0*  HGB 7.8*  HCT 25.1*  PLT 124*   ------------------------------------------------------------------------------------------------------------------  Chemistries   Recent Labs Lab 10/14/14 1341  11-08-14 0500  NA 134*  < > 132*  K 4.4  < > 4.3  CL 103  < > 101  CO2 25  < > 19*  GLUCOSE 220*  < > 170*  BUN 32*  < > 15  CREATININE SEE COMMENTS  < > UNABLE TO REPORT DUE TO ICTERUS  CALCIUM 8.5*  < > 8.5*  MG  --   < > 1.7  AST 362*  --   --   ALT 107*   --   --   ALKPHOS 1291*  --   --   BILITOT 27.7*  --   --   < > = values in this interval not displayed. ------------------------------------------------------------------------------------------------------------------ APTT- 48.  RADIOLOGY:  Dg Chest Port 1 View  10/07/2014   IMPRESSION: 1. Worsened bilateral airspace opacities. In the context of the patient's moderate enlargement of the cardiopericardial silhouette, the appearance is suspicious for acute pulmonary edema. 2. The endotracheal tube has been successfully retracted, and the tubes and lines appear satisfactorily positioned.   Electronically Signed   By: Van Clines M.D.   On: 10/07/2014 08:42     ASSESSMENT AND PLAN:   64 year old African American female history of nephrotic syndrome, amyloidosis, essential hypertension presenting on 5/25 with altered mental status/confusion after apparently falling out of bed. Admitted for septic shock  * Septic shock with right flank/thigh cellulitis with streptococcus bacteremia ( Bl Cx 09/23/2014) - ID following - Changed to Levaquin till 11/08/14 and clindamycin stopped. On erythromycin, Continue pressors. Was taken Off steroids due to negative blood culture later. - Echo- No vegetations. Repeat blood cultures ( 5/31 and 6/8) negative to date. - on 10/19/2014- again had hypothermia, hypotension, rise in WBCs- sent sepsis work ups and added vancomycin. Blood cx 6/13- is negative. Had to go up on 3 pressors and stress dose steroids, still Blood pressure running low. - Given small dose IV fluid and stopped fluid removal in CRRT.  *  Acute renal failure over CKD3 due to ATN. From septic shock in the setting of amyloidosis On CRRT. Appreciate nephrology help. Severe hypoalbuminemia. Will likely progress to ESRD.  * Acute liver failure- Due to sepsis - No improvement.  CT abd/pelvis showed no biliary dilation or obstruction Severe hyperbilirubinemia > 30. Gastroenterology following.   lactulose Have coagulopathy due to that- makes her even higher risk for surgery. Transfused FFp as needed for surgery. But sx on 10/13/2014 cancelled due to high APTT.  * Ileus Improving on Xray But significant OP in NG still. Continue LIS TFs when able to tolerate.  * Elevated INR - Due to liver failure. DIC unlikely per oncology. 4 units FFP given 6/1 minimal improvement in INR 2 FFP given 10/31/2014 morning- still APTT is high- so given 2 more FFP and Plt transfusion.   Procedure cancelled by ENT due to coagulopathy.  Now family leaning towards comfort care, so she might not need it.  * Acute encephalopathy - Due to acute illness. CT head nothing acute.  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 Plan was for chemo as OP with Dr. Grayland Ormond but now on hold due to acute illness. Oncology following  * Upper GI bleed with Acute blood loss  anemia over AOCD EGD showed severe esophagitis. On protonix  IV Transfused 4 units this admission.  * Type 2 diabetes, uncomplicated: Hold oral agents, at insulin slide scale every 6 hours Accu-Chek  * Essential hypertension: Held all meds  * Venous thromboembolism prophylactic: Heparin subcutaneous stopped due to high INR and low platelets and GIB  * Nutrition  TPN  * Acute respiratory failure -Full vent support - due to suspected aspiration of gastrointestinal content - Pulmonology following - Full ventilator support  * Trach and PEG ENT consulted. Will be high risk for any procedure with co-morbidities. Scheduled but cancelled on 10/22/2014  * Decubitus ulcer  CODE STATUS: FULL CODE As per discussion with her daughter in law his morning- they are leaning towards comfort care- waiting for some family member to arrive and then re- meeting with Dr. Ermalinda Memos- likely decision about extubation.  Multi organ failure. High risk for cardiac arrest and death   discussed with pt's son & daughter in law in room.   TOTAL CRITICAL CARE TIME  TAKING CARE OF THIS PATIENT: 40 minutes.    [name] M.D on Oct 31, 2014 at 11:37 AM  Between 7am to 6pm - Pager - [phone]  After 6pm go to [email or web address] - password [email or web address] [facility]  [facility]  Office  [phone]  CC: Primary care physician; [name], MD

## 2014-11-04 NOTE — Consult Note (Signed)
Spoke with son at bedside. He agrees with extubation to comfort care. Pt extubated, pressors and CRRT stopped. Family at bedside. Pt appears comfortable. Chaplain called.

## 2014-11-04 NOTE — Progress Notes (Signed)
   Nov 11, 2014 1216  Clinical Encounter Type  Visited With Family  Visit Type Follow-up;Death  Consult/Referral To Chaplain  Spiritual Encounters  Spiritual Needs Emotional  Stress Factors  Family Stress Factors Loss  Chaplain came to support familyh after patient died. Compassionate presence. Chaplain Kace Hartje A. Eshan Trupiano Ext. [phone]

## 2014-11-04 NOTE — Progress Notes (Signed)
Pt lethargic. BP liable  Pt MAP 55-65 pressors titrated  And maxed out. Pt Blood flow /UF decreased  To maintain pressure.Dr. Reece Levy notified of pt. Blood pressure , with no new orders noted. Pharmacy notified and consulted regarding BP. Will cont to monitor

## 2014-11-04 NOTE — Consult Note (Signed)
Palliative Medicine Inpatient Consult Follow Up Note   Name: [name] Date: Nov 14, 2014 MRN: [id number]  DOB: 08-Feb-1951  Referring Physician: [name], MD  Palliative Care consult requested for this 64 y.o. female for goals of medical therapy in patient with amyloidosis, CKD stage III, DM, HTN admitted with sepsis  Events of this AM noted. Pt remains intubated, now on maximal pressors, eyes open but unresponsive. Son at bedside.   REVIEW OF SYSTEMS:  Patient is not able to provide ROS  CODE STATUS: DNR   PAST MEDICAL HISTORY: Past Medical History  Diagnosis Date  . HTN (hypertension)   . Diabetes mellitus type II   . Obesity   . Arthritis   . Chronic kidney disease   . Amyloidosis     PAST SURGICAL HISTORY:  Past Surgical History  Procedure Laterality Date  . Total abdominal hysterectomy  10/1999    fibroids  . Combined abdominoplasty and liposuction  07/2000  . Total knee arthroplasty Right 09/28/2013    Procedure: RIGHT TOTAL KNEE ARTHROPLASTY;  Surgeon: Gina Pole, MD;  Location: [facility];  Service: Orthopedics;  Laterality: Right;    Vital Signs: BP 71/52 mmHg  Pulse 64  Temp(Src) 98.8 F (37.1 C) (Rectal)  Resp 24  Ht 5\' 1"  (1.549 m)  Wt 72.757 kg (160 lb 6.4 oz)  BMI 30.32 kg/m2  SpO2 42% Filed Weights   10/16/14 0430 10/07/2014 0500 Nov 14, 2014 0419  Weight: 73.9 kg (162 lb 14.7 oz) 73.5 kg (162 lb 0.6 oz) 72.757 kg (160 lb 6.4 oz)    Estimated body mass index is 30.32 kg/(m^2) as calculated from the following:   Height as of this encounter: 5\' 1"  (1.549 m).   Weight as of this encounter: 72.757 kg (160 lb 6.4 oz).  PHYSICAL EXAM: General: Critically ill appearing HEENT: ETT in place, scleral icterus Neck: Trachea midline  Cardiovascular: regular rate and rhythm Pulmonary/Chest: coarse BS's ant fields Abdominal: firm, nontender, hypoactive bowel sounds GU: Foley present, anuric Extremities: 3 + pitting edema BLE's and  BUE's Neurological: eyes open, no response to verbal stimuli Skin: R flank wound with gauze, CDI Psychiatric: unable to assess  LABS: CBC:    Component Value Date/Time   WBC 34.0* 2014/11/14 0500   WBC 9.2 08/22/2014 1712   HGB 7.8* 11-14-14 0500   HGB 12.3 08/22/2014 1712   HCT 25.1* 11/14/2014 0500   HCT 38.3 08/22/2014 1712   PLT 124* 11/14/2014 0500   PLT 503* 08/22/2014 1712   MCV 102.3* 14-Nov-2014 0500   MCV 87 08/22/2014 1712   NEUTROABS 21.1* 10/02/2014 1030   NEUTROABS 4.0 08/22/2014 1712   LYMPHSABS 1.5 10/02/2014 1030   LYMPHSABS 4.2* 08/22/2014 1712   MONOABS 2.0* 10/02/2014 1030   MONOABS 0.9 08/22/2014 1712   EOSABS 0.0 10/02/2014 1030   EOSABS 0.0 08/22/2014 1712   BASOSABS 0.0 10/02/2014 1030   BASOSABS 0.1 08/22/2014 1712   Comprehensive Metabolic Panel:    Component Value Date/Time   NA 132* 11/14/14 0500   NA 136 08/22/2014 1712   K 4.3 14-Nov-2014 0500   K 2.6* 08/22/2014 1712   CL 101 11/14/14 0500   CL 92* 08/22/2014 1712   CO2 19* 11/14/2014 0500   CO2 35* 08/22/2014 1712   BUN 15 11-14-14 0500   BUN 29* 08/22/2014 1712   CREATININE UNABLE TO REPORT DUE TO ICTERUS 2014/11/14 0500   CREATININE 1.78* 08/22/2014 1712   GLUCOSE 170* November 14, 2014 0500   GLUCOSE 167* 08/22/2014 1712  CALCIUM 8.5* October 24, 2014 0500   CALCIUM 8.2* 08/22/2014 1712   AST 362* 10/14/2014 1341   AST 40 08/22/2014 1712   ALT 107* 10/14/2014 1341   ALT 22 08/22/2014 1712   ALKPHOS 1291* 10/14/2014 1341   ALKPHOS 294* 08/22/2014 1712   BILITOT 27.7* 10/14/2014 1341   PROT 5.8* 10/14/2014 1341   PROT 5.0* 08/22/2014 1712   ALBUMIN 1.9* 10/14/2014 2354   ALBUMIN 1.7* 08/22/2014 1712    IMPRESSION: [name] is a 64 y.o. female with amyloidosis, CKD stage III, DM, HTN.She was admitted 09/08/2014 with sepsis secondary to R.thigh cellulitis/strep bacteremia. She also had ARF requiring CRRT. Hospital course further complicated by VDRF (intubated 5/30), liver  failure and hypotension requiring multiple pressors. She is getting TPN due to ileus. Plan was for trach but procedure postponed due to elevated PTT.  Pt now with worsening leukocytosis, hypotension, hypothermia. On maximal pressor support. Unresponsive. Son has now decided on DNR status. Prognosis extremely poor.   Updated son at bedside.   PLAN: As above  REFERRALS TO BE ORDERED:  Chaplain   More than 50% of the visit was spent in counseling/coordination of care: YES  Time spent: 35 minutes

## 2014-11-04 NOTE — Progress Notes (Signed)
Subjective:  CRRT continued No UOP UF turned off High dose of 3 pressors Remains on ventilator Family at bedside  Objective:  Vital signs in last 24 hours:  Temp:  [94.5 F (34.7 C)-99.7 F (37.6 C)] 98.6 F (37 C) (06/14 0830) Pulse Rate:  [46-98] 48 (06/14 0830) Resp:  [14-27] 25 (06/14 0830) BP: (59-123)/(45-109) 66/49 mmHg (06/14 0830) SpO2:  [37 %-100 %] 45 % (06/14 0830) FiO2 (%):  [35 %-100 %] 100 % (06/14 0837) Weight:  [72.757 kg (160 lb 6.4 oz)] 72.757 kg (160 lb 6.4 oz) (06/14 0419)  Weight change: -0.743 kg (-1 lb 10.2 oz) Filed Weights   10/16/14 0430 10/14/2014 0500 2014-10-28 0419  Weight: 73.9 kg (162 lb 14.7 oz) 73.5 kg (162 lb 0.6 oz) 72.757 kg (160 lb 6.4 oz)    Intake/Output: I/O last 3 completed shifts: In: 5456 [I.V.:3542.2; Blood:1552; NG/GT:60; IV Piggyback:400] Out: 4442 [Urine:5; YBWLS:9373]     Physical Exam: General: Critically ill appearing  HEENT Scleral icterus noted, ETT in place, NGT to suction  Neck RIJ TLC  Pulm/lungs Decreased breath sounds at bases, vent assisted   CVS/Heart S1S2 no rubs  Abdomen:  distended, tense, decreased bowel sounds  Extremities: Skin wrinkling noted in b/l LE's; 1+ pitting edema  Neurologic: On the vent  Skin: Bleeding from lips noted  Access: Temp cath- rt femoral 5/25 Dr. Delana Meyer       Basic Metabolic Panel:  Recent Labs Lab 10/14/14 4287  10/15/14 6811  10/16/14 0002 10/16/14 0413 10/16/14 5726 10/16/14 1627 10/16/14 2353 10/31/2014 2035 10/23/2014 1621 11/01/2014 2354 10-28-2014 0500  NA 136  < >  --   < > 134* 136 136 134* 136 135 134* 134* 132*  K 5.1  < >  --   < > 3.9 4.3 4.4 4.4 4.1 4.2 3.8 4.2 4.3  CL 105  < >  --   < > 103 106 103 104 105 102 102 103 101  CO2 27  < >  --   < > 27 25 28 24 27 24 24 22  19*  GLUCOSE 150*  < >  --   < > 239* 132* 153* 257* 134* 197* 203* 155* 170*  BUN 24*  < >  --   < > 23* 23* 22* 25* 22* 20 19 16 15   CREATININE NOT VALID  < >  --   < > SEE COMMENTS SEE  COMMENTS SEE COMMENTS SEE COMMENTS NOT VALID SEE COMMENTS UNABLE TO REPORT DUE TO ICTERUS UNABLE TO REPORT DUE TO ICTERUS UNABLE TO REPORT DUE TO ICTERUS  CALCIUM 8.8*  < >  --   < > 8.6* 8.8* 8.7* 8.3* 8.8* 8.7* 8.8* 8.6* 8.5*  MG 1.9  --  1.8  --   --  1.7  --   --   --  1.7  --   --  1.7  PHOS  --   < >  --   < > SEE COMMENTS  --  SEE COMMENTS SEE COMMENTS  --   --  UNABLE TO REPORT DUE TO ICTERUS UNABLE TO REPORT DUE TO ICTERUS  --   < > = values in this interval not displayed.   CBC:  Recent Labs Lab 10/15/14 0307 10/16/14 0413 10/27/2014 0633 10/28/2014 1809 10-28-2014 0500  WBC 13.4* 12.0* 23.3* 25.7* 34.0*  HGB 8.9* 10.2* 9.0* 7.8* 7.8*  HCT 27.3* 31.2* 27.4* 24.2* 25.1*  MCV 96.3 97.2 99.0 99.5 102.3*  PLT 70* 72* 69*  157 124*      Microbiology: Results for orders placed or performed during the hospital encounter of 09/14/2014  Blood culture (routine x 2)     Status: None   Collection Time: 09/18/2014 12:45 AM  Result Value Ref Range Status   Specimen Description BLOOD  Final   Special Requests NONE  Final   Culture  Setup Time   Final    GRAM POSITIVE COCCI IN CHAINS IN BOTH AEROBIC AND ANAEROBIC BOTTLES CRITICAL RESULT CALLED TO, READ BACK BY AND VERIFIED WITH: PAM CRAWFORD AT 1406 09/18/2014 DV    Culture STREPTOCOCCUS SPECIES  Final   Report Status 10/01/2014 FINAL  Final   Organism ID, Bacteria STREPTOCOCCUS SPECIES  Final      Susceptibility   Streptococcus species - MIC (ETEST)*    ERYTHROMYCIN Value in next row Resistant      RESISTANT6    PENICILLIN Value in next row Intermediate      INTERMEDIATE0.25    CEFTRIAXONE Value in next row Sensitive      SENSITIVE0.047    VANCOMYCIN Value in next row Sensitive      SENSITIVE1.0    LEVOFLOXACIN Value in next row Sensitive      SENSITIVE1.0    * STREPTOCOCCUS SPECIES  Blood culture (routine x 2)     Status: None   Collection Time: 09/18/2014 12:45 AM  Result Value Ref Range Status   Specimen Description BLOOD  Final    Special Requests NONE  Final   Culture  Setup Time   Final    GRAM POSITIVE COCCI IN CHAINS IN BOTH AEROBIC AND ANAEROBIC BOTTLES CRITICAL RESULT CALLED TO, READ BACK BY AND VERIFIED WITH: PAM CRAWFORD AT 1406 09/11/2014 DV    Culture   Final    STREPTOCOCCUS SPECIES REFER TO ACCESSION [id number] COLLECTED ON 10/01/2014 AT 0045 FOR SENSITIVITIES    Report Status 09/30/2014 FINAL  Final  Culture, blood (routine x 2)     Status: None   Collection Time: 10/04/14 11:50 AM  Result Value Ref Range Status   Specimen Description BLOOD  Final   Special Requests NONE  Final   Culture NO GROWTH 5 DAYS  Final   Report Status 10/09/2014 FINAL  Final  Culture, blood (routine x 2)     Status: None   Collection Time: 10/04/14 12:00 PM  Result Value Ref Range Status   Specimen Description BLOOD  Final   Special Requests NONE  Final   Culture NO GROWTH 5 DAYS  Final   Report Status 10/09/2014 FINAL  Final  Culture, respiratory (NON-Expectorated)     Status: None   Collection Time: 10/04/14  2:00 PM  Result Value Ref Range Status   Specimen Description INDUCED SPUTUM  Final   Special Requests Normal  Final   Gram Stain   Final    EXCELLENT SPECIMEN - 90-100% WBCS MANY WBC SEEN MANY YEAST    Culture MODERATE GROWTH CANDIDA ALBICANS  Final   Report Status 10/07/2014 FINAL  Final  Culture, blood (routine x 2)     Status: None   Collection Time: 10/12/14  1:34 PM  Result Value Ref Range Status   Specimen Description BLOOD  Final   Special Requests Normal  Final   Culture NO GROWTH 5 DAYS  Final   Report Status 10/22/2014 FINAL  Final  Culture, blood (routine x 2)     Status: None   Collection Time: 10/12/14  4:30 PM  Result Value Ref Range Status  Specimen Description BLOOD  Final   Special Requests Normal  Final   Culture NO GROWTH 5 DAYS  Final   Report Status 10/11/2014 FINAL  Final  Culture, blood (routine x 2)     Status: None (Preliminary result)   Collection Time: 10/31/2014  8:45 PM   Result Value Ref Range Status   Specimen Description BLOOD  Final   Special Requests Normal  Final   Culture NO GROWTH < 12 HOURS  Final   Report Status PENDING  Incomplete  Culture, blood (routine x 2)     Status: None (Preliminary result)   Collection Time: 10/30/2014  9:00 PM  Result Value Ref Range Status   Specimen Description BLOOD  Final   Special Requests Normal  Final   Culture NO GROWTH < 12 HOURS  Final   Report Status PENDING  Incomplete    Coagulation Studies: No results for input(s): LABPROT, INR in the last 72 hours.  Urinalysis: No results for input(s): COLORURINE, LABSPEC, PHURINE, GLUCOSEU, HGBUR, BILIRUBINUR, KETONESUR, PROTEINUR, UROBILINOGEN, NITRITE, LEUKOCYTESUR in the last 72 hours.  Invalid input(s): APPERANCEUR    Imaging: Dg Chest 1 View  10/21/2014   CLINICAL DATA:  Sepsis due to pneumonia. The patient was scheduled for tracheostomy this morning because of prolonged intubation. She hasMedical issues and has had elevated clotting times.  EXAM: CHEST  1 VIEW  COMPARISON:  10/07/2014  FINDINGS: Patchy bilateral airspace consolidation has improved. There are no new lung abnormalities. No pneumothorax or pleural effusion.  Endotracheal tube tip well-positioned projecting 2 cm above the carina. Nasogastric tube passes below the diaphragm well into the stomach. Right IJ central venous line tip projects in the mid superior vena cava, stable.  IMPRESSION: 1. Improved lung aeration.  No new lung abnormalities. 2. Support apparatus is well positioned.   Electronically Signed   By: Lajean Manes M.D.   On: 10/30/2014 12:37     Medications:   . Marland KitchenTPN (CLINIMIX-E) Adult 70 mL/hr at 11/07/2014 0742  . dextrose    . epinephrine 12 mcg/min (07-Nov-2014 0836)  . fentaNYL infusion INTRAVENOUS 200 mcg/hr (11/07/2014 0821)  . norepinephrine (LEVOPHED) Adult infusion 70 mcg/min (11-07-14 0836)  . phenylephrine (NEO-SYNEPHRINE) Adult infusion 400 mcg/min (November 07, 2014 0742)  . pureflow  2,500 mL/hr at 10/20/2014 1549  . vasopressin (PITRESSIN) infusion - *FOR SHOCK* 0.03 Units/min (November 07, 2014 0742)   . sodium chloride   Intravenous Once  . antiseptic oral rinse  7 mL Mouth Rinse QID  . budesonide (PULMICORT) nebulizer solution  0.5 mg Nebulization BID  . chlorhexidine  15 mL Mouth Rinse BID  . erythromycin  250 mg Intravenous 3 times per day  . hydrocortisone sod succinate (SOLU-CORTEF) inj  50 mg Intravenous Q6H  . insulin aspart  2-6 Units Subcutaneous 6 times per day  . insulin glargine  15 Units Subcutaneous Daily  . ipratropium-albuterol  3 mL Nebulization QID  . lactulose  20 g Oral BID  . levofloxacin (LEVAQUIN) IV  500 mg Intravenous Q24H  . midodrine  10 mg Oral TID  . pantoprazole (PROTONIX) IV  40 mg Intravenous Q12H  . sodium chloride  10-40 mL Intracatheter Q12H  . sucralfate  1 g Oral 4 times per day  . vancomycin  1,000 mg Intravenous Q24H   acetaminophen **OR** acetaminophen, albuterol, dextrose, heparin, midazolam, polyethylene glycol powder, sodium chloride  Assessment/ Plan:   63 y.o. African [place] female with diabetes mellitus type 2, hypertension, morbid obesity, osteoarthritis, status post right knee surgery, Amyloidosis  with nephrotic syndrome with edema, hypoalbuminemia, renal insufficiency and proteinuria.  1. Acute Renal Failure with chronic kidney disease stage III with proteinuria with nephrotic syndrome secondary Renal Amyloidosis. Now progression to End Stage Renal Disease. Anuric; on Continuous Renal Replacement Therapy since 5/25.  - K bath switched to 4k, K 4.3.  Continued CRRT at present.   Pressor requirement  high  2. Sepsis: Strep in blood cultures 5/25: afebrile. Repeat blood cultures with no growth.  - continued on levofloxacin and erythromycin.  3.  Acute Respiratory failure:  Trach being planned for Monday. D/w Ms Danielle/ Patient's daughter in law and Patient's son  Plan for withdrawal of care later today once all family  gets here    LOS: 63 Arla Boutwell 07-06-20169:19 AM

## 2014-11-04 NOTE — Progress Notes (Signed)
Pt deceased  [place], [place]   2014/11/12, 3:07 PM

## 2014-11-04 NOTE — Progress Notes (Signed)
Pt hypotensive, bp 63/51 (57) despite max dose on levophed, vasopressin, and neosynephrine.  Spoke with Dr. Anselm Jungling regarding hypotension, per Anselm Jungling give dose 1 mg epinephrine, and may start epinephrine drip.  Also called and spoke with pt daughter in law Andee Poles regarding pt decline and code status.  Andee Poles spoke with her husband Kara Dies (pt son and POA), per Andee Poles do not resucitate pt if she cardiac arrests, make pt DNR.  Will make every attempt to sustain pt until family arrives, family most likely will withdraw care.  Notifed Dr. Anselm Jungling of family request for DNR.

## 2014-11-04 NOTE — Progress Notes (Signed)
[facility] NOTE  Pharmacy Consult for Electrolyte and CRRT medication management Indication: ICU Status, CRRT, TPN   No Known Allergies  Patient Measurements: Height: 5\' 1"  (154.9 cm) Weight: 160 lb 6.4 oz (72.757 kg) IBW/kg (Calculated) : 47.8   Vital Signs: Temp: 98.6 F (37 C) (06/14 0830) Temp Source: Rectal (06/14 0830) BP: 66/49 mmHg (06/14 0830) Pulse Rate: 48 (06/14 0830) Intake/Output from previous day: 06/13 0701 - 06/14 0700 In: 5001.8 [I.V.:2873; Blood:925; NG/GT:60; IV Piggyback:200; TPN:943.8] Out: 2853 [Urine:5] Intake/Output from this shift:   Vent settings for last 24 hours: Vent Mode:  [-] PRVC FiO2 (%):  [35 %-100 %] 100 % Set Rate:  [8 bmp-15 bmp] 15 bmp Vt Set:  [500 mL] 500 mL PEEP:  [5 cmH20] 5 cmH20 Pressure Support:  [20 cmH20] 20 cmH20  Labs:  Recent Labs  10/16/14 0413  10/16/14 1627 10/16/14 2353 10/10/2014 0633 11/03/2014 1621 10/22/2014 1809 10/30/2014 2354 11/05/14 0500 11-05-14 0650  WBC 12.0*  --   --   --  23.3*  --  25.7*  --  34.0*  --   HGB 10.2*  --   --   --  9.0*  --  7.8*  --  7.8*  --   HCT 31.2*  --   --   --  27.4*  --  24.2*  --  25.1*  --   PLT 72*  --   --   --  69*  --  157  --  124*  --   APTT 48*  --   --   --  48*  --  47*  --   --  60*  CREATININE SEE COMMENTS  < > SEE COMMENTS NOT VALID SEE COMMENTS UNABLE TO REPORT DUE TO ICTERUS  --  UNABLE TO REPORT DUE TO ICTERUS UNABLE TO REPORT DUE TO ICTERUS  --   MG 1.7  --   --   --  1.7  --   --   --  1.7  --   PHOS  --   < > SEE COMMENTS  --   --  UNABLE TO REPORT DUE TO ICTERUS  --  UNABLE TO REPORT DUE TO ICTERUS  --   --   ALBUMIN  --   < > 1.7* 1.7*  --  2.1*  --  1.9*  --   --   < > = values in this interval not displayed. CrCl cannot be calculated (Patient has no serum creatinine result on file.).   Recent Labs  10/25/2014 2352 11/05/2014 0423 11/05/2014 0737  GLUCAP 161* 158* 133*    Microbiology: Recent Results (from the past 720  hour(s))  Blood culture (routine x 2)     Status: None   Collection Time: 09/12/2014 12:45 AM  Result Value Ref Range Status   Specimen Description BLOOD  Final   Special Requests NONE  Final   Culture  Setup Time   Final    GRAM POSITIVE COCCI IN CHAINS IN BOTH AEROBIC AND ANAEROBIC BOTTLES CRITICAL RESULT CALLED TO, READ BACK BY AND VERIFIED WITH: PAM CRAWFORD AT 1406 09/05/2014 DV    Culture STREPTOCOCCUS SPECIES  Final   Report Status 10/01/2014 FINAL  Final   Organism ID, Bacteria STREPTOCOCCUS SPECIES  Final      Susceptibility   Streptococcus species - MIC (ETEST)*    ERYTHROMYCIN Value in next row Resistant      RESISTANT6    PENICILLIN Value in next  row Intermediate      INTERMEDIATE0.25    CEFTRIAXONE Value in next row Sensitive      SENSITIVE0.047    VANCOMYCIN Value in next row Sensitive      SENSITIVE1.0    LEVOFLOXACIN Value in next row Sensitive      SENSITIVE1.0    * STREPTOCOCCUS SPECIES  Blood culture (routine x 2)     Status: None   Collection Time: 09/22/2014 12:45 AM  Result Value Ref Range Status   Specimen Description BLOOD  Final   Special Requests NONE  Final   Culture  Setup Time   Final    GRAM POSITIVE COCCI IN CHAINS IN BOTH AEROBIC AND ANAEROBIC BOTTLES CRITICAL RESULT CALLED TO, READ BACK BY AND VERIFIED WITH: PAM CRAWFORD AT 1406 09/21/2014 DV    Culture   Final    STREPTOCOCCUS SPECIES REFER TO ACCESSION [id number] COLLECTED ON 09/16/2014 AT 0045 FOR SENSITIVITIES    Report Status 09/30/2014 FINAL  Final  Culture, blood (routine x 2)     Status: None   Collection Time: 10/04/14 11:50 AM  Result Value Ref Range Status   Specimen Description BLOOD  Final   Special Requests NONE  Final   Culture NO GROWTH 5 DAYS  Final   Report Status 10/09/2014 FINAL  Final  Culture, blood (routine x 2)     Status: None   Collection Time: 10/04/14 12:00 PM  Result Value Ref Range Status   Specimen Description BLOOD  Final   Special Requests NONE  Final   Culture NO  GROWTH 5 DAYS  Final   Report Status 10/09/2014 FINAL  Final  Culture, respiratory (NON-Expectorated)     Status: None   Collection Time: 10/04/14  2:00 PM  Result Value Ref Range Status   Specimen Description INDUCED SPUTUM  Final   Special Requests Normal  Final   Gram Stain   Final    EXCELLENT SPECIMEN - 90-100% WBCS MANY WBC SEEN MANY YEAST    Culture MODERATE GROWTH CANDIDA ALBICANS  Final   Report Status 10/07/2014 FINAL  Final  Culture, blood (routine x 2)     Status: None   Collection Time: 10/12/14  1:34 PM  Result Value Ref Range Status   Specimen Description BLOOD  Final   Special Requests Normal  Final   Culture NO GROWTH 5 DAYS  Final   Report Status 10/16/2014 FINAL  Final  Culture, blood (routine x 2)     Status: None   Collection Time: 10/12/14  4:30 PM  Result Value Ref Range Status   Specimen Description BLOOD  Final   Special Requests Normal  Final   Culture NO GROWTH 5 DAYS  Final   Report Status 10/16/2014 FINAL  Final  Culture, blood (routine x 2)     Status: None (Preliminary result)   Collection Time: 10/19/2014  8:45 PM  Result Value Ref Range Status   Specimen Description BLOOD  Final   Special Requests Normal  Final   Culture NO GROWTH < 12 HOURS  Final   Report Status PENDING  Incomplete  Culture, blood (routine x 2)     Status: None (Preliminary result)   Collection Time: 10/15/2014  9:00 PM  Result Value Ref Range Status   Specimen Description BLOOD  Final   Special Requests Normal  Final   Culture NO GROWTH < 12 HOURS  Final   Report Status PENDING  Incomplete    Medications:  Scheduled:  . sodium  chloride   Intravenous Once  . antiseptic oral rinse  7 mL Mouth Rinse QID  . budesonide (PULMICORT) nebulizer solution  0.5 mg Nebulization BID  . chlorhexidine  15 mL Mouth Rinse BID  . erythromycin  250 mg Intravenous 3 times per day  . hydrocortisone sod succinate (SOLU-CORTEF) inj  50 mg Intravenous Q6H  . insulin aspart  2-6 Units  Subcutaneous 6 times per day  . insulin glargine  15 Units Subcutaneous Daily  . ipratropium-albuterol  3 mL Nebulization QID  . lactulose  20 g Oral BID  . levofloxacin (LEVAQUIN) IV  500 mg Intravenous Q24H  . midodrine  10 mg Oral TID  . pantoprazole (PROTONIX) IV  40 mg Intravenous Q12H  . sodium chloride  10-40 mL Intracatheter Q12H  . sucralfate  1 g Oral 4 times per day  . vancomycin  1,000 mg Intravenous Q24H   Infusions:  . Marland KitchenTPN (CLINIMIX-E) Adult 70 mL/hr at 2014/10/28 0742  . dextrose    . epinephrine 20 mcg/min (28-Oct-2014 0925)  . fentaNYL infusion INTRAVENOUS 250 mcg/hr (10/28/14 0924)  . norepinephrine (LEVOPHED) Adult infusion 70 mcg/min (28-Oct-2014 0836)  . phenylephrine (NEO-SYNEPHRINE) Adult infusion 400 mcg/min (10-28-14 0926)  . pureflow 2,500 mL/hr at 10-28-14 0959  . vasopressin (PITRESSIN) infusion - *FOR SHOCK* 0.03 Units/min (Oct 28, 2014 0742)   PRN: acetaminophen **OR** acetaminophen, albuterol, dextrose, heparin, midazolam, polyethylene glycol powder, sodium chloride  Assessment: 64 yo female requiring mechanical ventilation, CRRT, and TPN.    Plan:   1. Electrolytes/TPN: Will replace magnesium 2g IV x 1. Will continue to monitor. Patient's insulin drip discontinued over night. Patient only placed on sliding scale insulin. Would recommend placing patient back on insulin drip at this time as patient has planned trach and possible PEG this week. Patient's previous two blood glucose levels > 180.   2. CRRT Medication adjustment: no further adjustments warranted at this time.   3. Levofloxacin: Will continue patient on levofloxacin 500mg  IV q24hr through stop date on 6/14. Will need to adjust dosing if CRRT is stopped.  4. Vancomycin: Will continue patient on vancomycin 1g IV q24hr while on CRRT. Will monitor vancomycin level prior to 3rd dose on 6/15. Repeat blood cultures (6/13) NG x 12 hours.   5. Constipation: Patient with small bowel movement this am. Will  discontinue erythromycin 250mg  IV Q8hr. Will continue lactulose 20g VT BID. Will continue to monitor.   Pharmacy will continue to monitor and adjust per consult.     Simpson,Michael L 10/28/14,10:00 AM

## 2014-11-04 NOTE — Progress Notes (Signed)
Comfort care,extubated without complications 

## 2014-11-04 NOTE — Progress Notes (Signed)
Pt expired, asystole, no bp, no heart or lung sounds ausculatated.  Dannielle Huh RN verified these findings.  Pt was made comfort care, care withdrawn.  Pt expires shortly after care withdrawn.  Pt family at bedside Kara Dies, Danielle, and niece) at time of death.  Time of death is 1155.  Notified [employer] [facility] [employer], Therapist, art, and Dr. Anselm Jungling of pt expiration.  Per Shawna Clamp of [facility], pt is not a candidate for any organ or eye donation and body may be released.  Per Andee Poles (daughter-in-law), pt body to be released to [employer] and [employer] in [place].

## 2014-11-04 NOTE — Progress Notes (Signed)
Unable to pick up O2 saturation on pt.  Notified respiratory therapy, RT increased pt vent support to 100%FiO2.

## 2014-11-04 DEATH — deceased

## 2015-05-03 ENCOUNTER — Other Ambulatory Visit: Payer: Self-pay | Admitting: Nurse Practitioner

## 2017-03-12 IMAGING — CT CT CHEST-ABD-PELV W/O CM
2 of 4 series · 15 of 46 positions shown, 17 images · non-contrast
Comparison: Clinic note of 08/22/2014. Chest radiograph 06/26/2014.

CLINICAL DATA: Amyloidosis. Leg swelling since [REDACTED]. Diabetic.
Elevated creatinine.

EXAM:
CT CHEST, ABDOMEN AND PELVIS WITHOUT CONTRAST
TECHNIQUE: Multidetector CT imaging of the chest, abdomen and pelvis was
performed following the standard protocol without IV contrast.

[Series 2: cap without · axial · non-contrast · 0.68mm/px · z∈[-964,-429]mm · 12 of 119 slices shown, 14 images]
[im 6/119  soft-tissue]
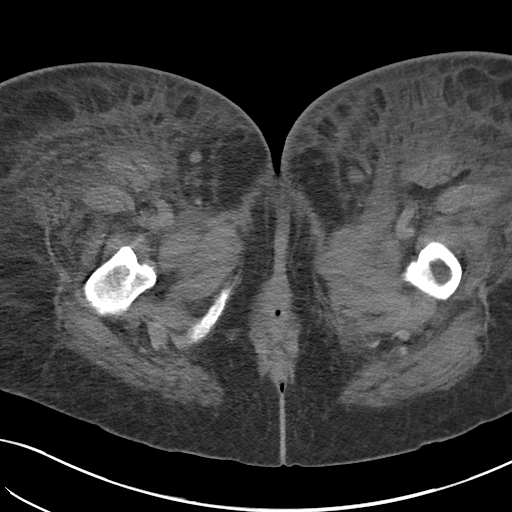
[im 6/119  bone]
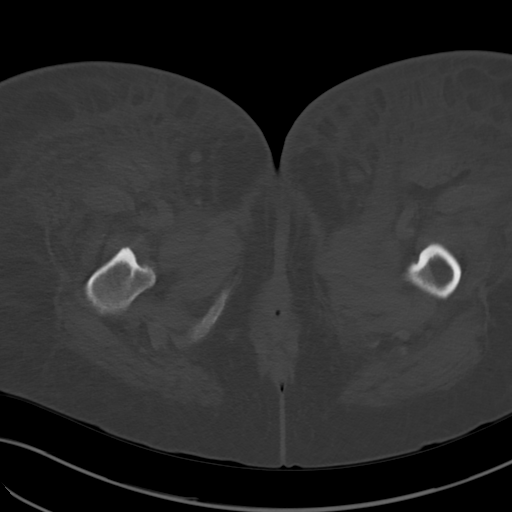
[im 17/119  soft-tissue]
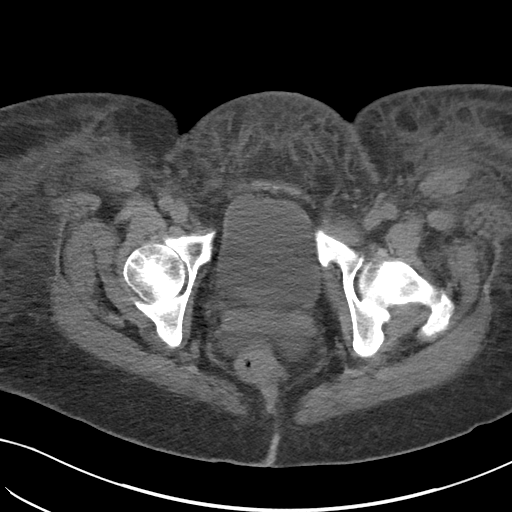
[im 29/119  soft-tissue]
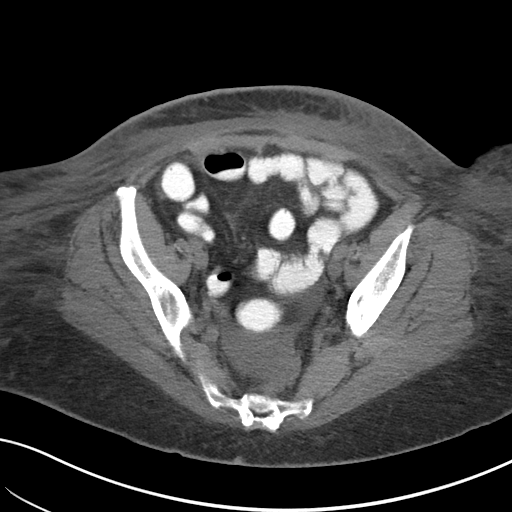
[im 34/119  soft-tissue]
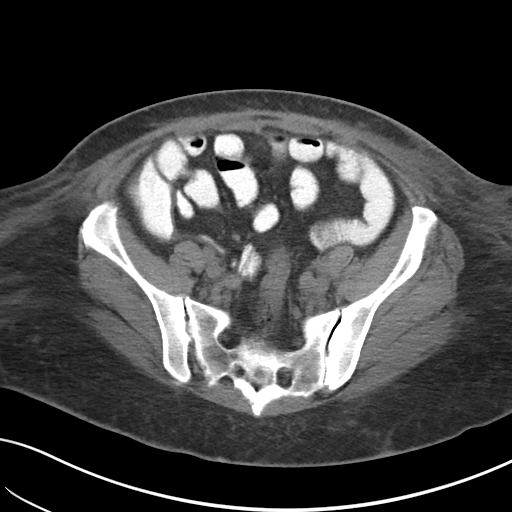
[im 45/119  soft-tissue]
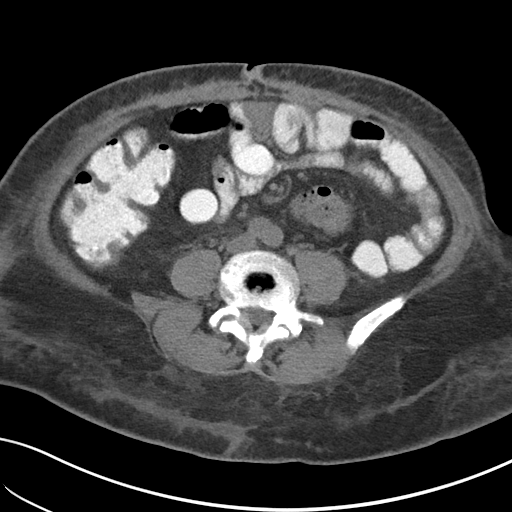
[im 57/119  soft-tissue]
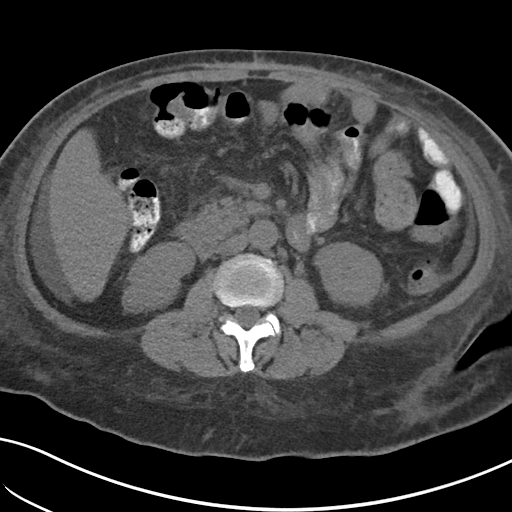
[im 62/119  soft-tissue]
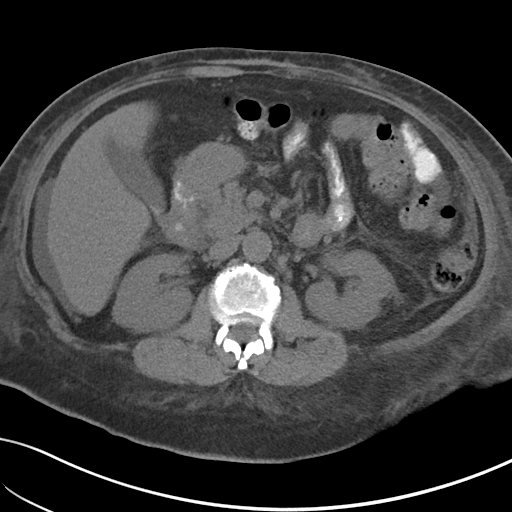
[im 74/119  soft-tissue]
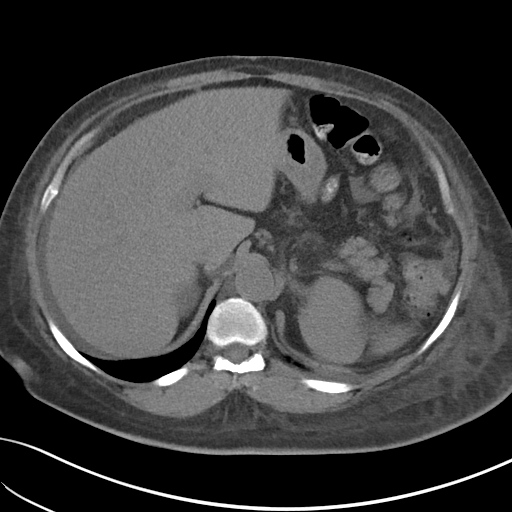
[im 85/119  soft-tissue]
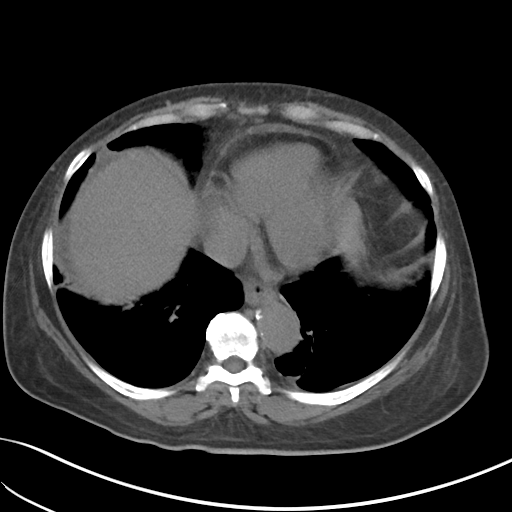
[im 85/119  bone]
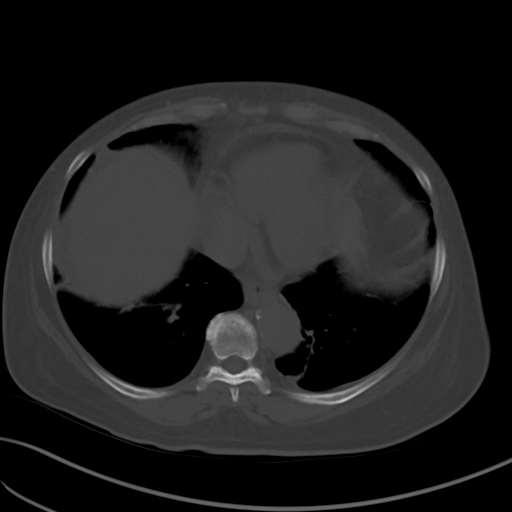
[im 90/119  soft-tissue]
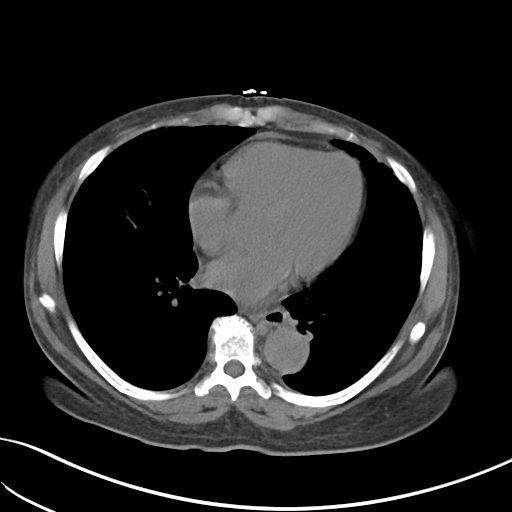
[im 102/119  soft-tissue]
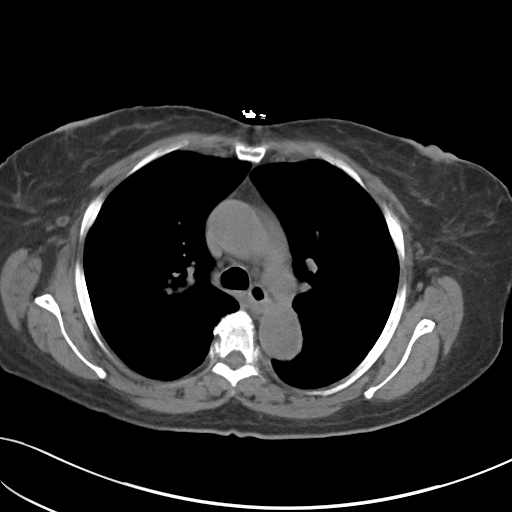
[im 113/119  soft-tissue]
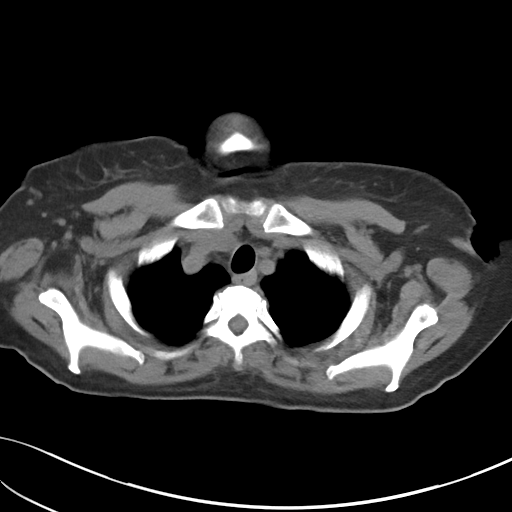

[Series 5: cor cap without · coronal · non-contrast · 0.72mm/px · 3 of 158 slices shown]
[im 53/158  soft-tissue]
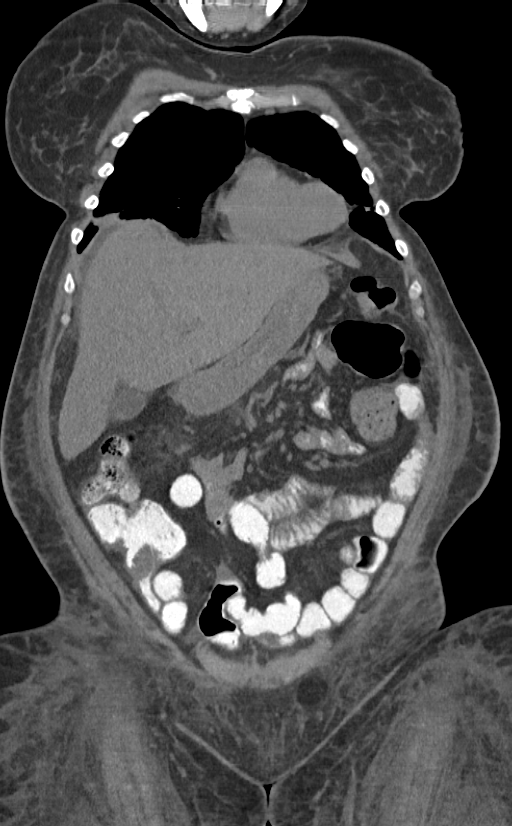
[im 70/158  soft-tissue]
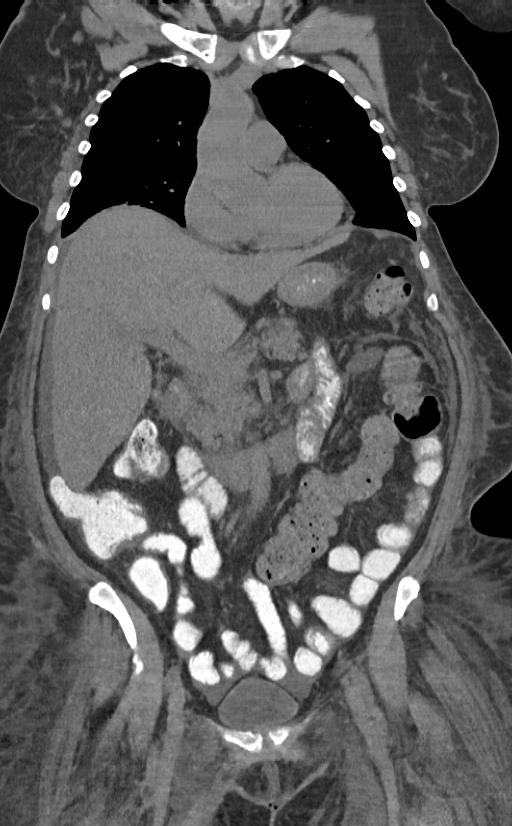
[im 88/158  soft-tissue]
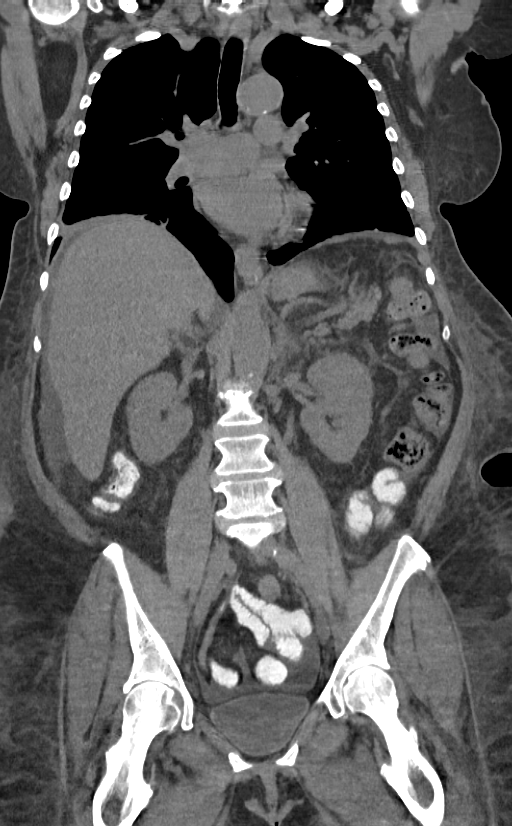

[15 of 46 positions shown; findings below may reference images not displayed]

FINDINGS: CT CHEST FINDINGS

Mediastinum/Nodes: No axillary adenopathy. Aortic atherosclerosis.
Tortuous descending thoracic aorta. Mild cardiomegaly, without
pericardial effusion.

No mediastinal or definite hilar adenopathy, given limitations of
unenhanced CT.

Lungs/Pleura: Trace left pleural fluid or thickening. Volume loss at
the lung bases anteriorly, greater on the right. There is also
bibasilar subsegmental atelectasis or scar.

Musculoskeletal: Lipoma in the right axilla measures 4.6 cm.
Bilateral glenohumeral joint osteoarthritis.

CT ABDOMEN AND PELVIS FINDINGS

Hepatobiliary: Hepatomegaly, 20 cm. Small gallstones without acute
cholecystitis or biliary ductal dilatation.

Pancreas: Normal, without mass or ductal dilatation.

Spleen: Normal

Adrenals/Urinary Tract: Normal left adrenal gland. Low-density right
adrenal nodule measures 2.4 cm, consistent with an adenoma.

No renal calculi or hydronephrosis.    No bladder calculi.

Stomach/Bowel: Underdistended stomach. Normal colon, appendix, and
terminal ileum. Normal small bowel.

Vascular/Lymphatic: Normal caliber of the aorta and branch vessels.
No abdominopelvic adenopathy.

Reproductive: Hysterectomy.  No adnexal mass.

Other: Small volume abdominal pelvic ascites. Anasarca about the
lower pelvis and imaged upper extremities

Musculoskeletal: 8 mm lucent lesion in the right iliac.
Thoracolumbar spondylosis. Prominent disc bulges at L3-4 and L4-5.
IMPRESSION: 1. Isolated subtle nonspecific subcentimeter right iliac lucent
lesion. Otherwise, no evidence of multiple myeloma.
2. No adenopathy within the chest, abdomen, or pelvis.
3. Abdominal pelvic ascites and anasarca. Question fluid
overload/congestive heart failure.
4. Right adrenal adenoma.
5. Trace left pleural fluid.
6. Cholelithiasis.
